# Patient Record
Sex: Female | Born: 1950 | Race: White | Hispanic: No | Smoking: Current every day smoker
Health system: Southern US, Community
[De-identification: ages and names within clinical notes are randomized; demographics above are authoritative.]

## PROBLEM LIST (undated history)

## (undated) DIAGNOSIS — I714 Abdominal aortic aneurysm, without rupture, unspecified: Secondary | ICD-10-CM

## (undated) DIAGNOSIS — IMO0002 Reserved for concepts with insufficient information to code with codable children: Secondary | ICD-10-CM

## (undated) DIAGNOSIS — T7840XA Allergy, unspecified, initial encounter: Secondary | ICD-10-CM

## (undated) DIAGNOSIS — I10 Essential (primary) hypertension: Secondary | ICD-10-CM

## (undated) DIAGNOSIS — I509 Heart failure, unspecified: Secondary | ICD-10-CM

## (undated) DIAGNOSIS — E785 Hyperlipidemia, unspecified: Secondary | ICD-10-CM

## (undated) DIAGNOSIS — T82898A Other specified complication of vascular prosthetic devices, implants and grafts, initial encounter: Secondary | ICD-10-CM

## (undated) DIAGNOSIS — Z923 Personal history of irradiation: Secondary | ICD-10-CM

## (undated) DIAGNOSIS — C719 Malignant neoplasm of brain, unspecified: Secondary | ICD-10-CM

## (undated) DIAGNOSIS — J449 Chronic obstructive pulmonary disease, unspecified: Secondary | ICD-10-CM

## (undated) DIAGNOSIS — C3490 Malignant neoplasm of unspecified part of unspecified bronchus or lung: Secondary | ICD-10-CM

## (undated) DIAGNOSIS — I639 Cerebral infarction, unspecified: Secondary | ICD-10-CM

## (undated) HISTORY — DX: Malignant neoplasm of unspecified part of unspecified bronchus or lung: C34.90

## (undated) HISTORY — DX: Abdominal aortic aneurysm, without rupture, unspecified: I71.40

## (undated) HISTORY — DX: Abdominal aortic aneurysm, without rupture: I71.4

## (undated) HISTORY — DX: Other specified complication of vascular prosthetic devices, implants and grafts, initial encounter: T82.898A

## (undated) HISTORY — DX: Reserved for concepts with insufficient information to code with codable children: IMO0002

## (undated) HISTORY — PX: OTHER SURGICAL HISTORY: SHX169

## (undated) HISTORY — DX: Cerebral infarction, unspecified: I63.9

## (undated) HISTORY — PX: CHOLECYSTECTOMY: SHX55

## (undated) HISTORY — DX: Allergy, unspecified, initial encounter: T78.40XA

## (undated) HISTORY — DX: Personal history of irradiation: Z92.3

## (undated) HISTORY — DX: Malignant neoplasm of brain, unspecified: C71.9

## (undated) HISTORY — DX: Chronic obstructive pulmonary disease, unspecified: J44.9

## (undated) HISTORY — PX: PARTIAL HYSTERECTOMY: SHX80

## (undated) HISTORY — DX: Hyperlipidemia, unspecified: E78.5

## (undated) HISTORY — PX: CARPAL TUNNEL RELEASE: SHX101

---

## 1997-09-21 ENCOUNTER — Encounter: Admission: RE | Admit: 1997-09-21 | Discharge: 1997-12-20 | Payer: Self-pay | Admitting: Orthopaedic Surgery

## 1999-04-02 ENCOUNTER — Emergency Department (HOSPITAL_COMMUNITY): Admission: EM | Admit: 1999-04-02 | Discharge: 1999-04-02 | Payer: Self-pay | Admitting: Emergency Medicine

## 1999-09-30 ENCOUNTER — Emergency Department (HOSPITAL_COMMUNITY): Admission: EM | Admit: 1999-09-30 | Discharge: 1999-09-30 | Payer: Self-pay | Admitting: Emergency Medicine

## 1999-09-30 ENCOUNTER — Encounter: Payer: Self-pay | Admitting: Emergency Medicine

## 2000-10-05 ENCOUNTER — Encounter: Payer: Self-pay | Admitting: Emergency Medicine

## 2000-10-05 ENCOUNTER — Emergency Department (HOSPITAL_COMMUNITY): Admission: EM | Admit: 2000-10-05 | Discharge: 2000-10-05 | Payer: Self-pay | Admitting: Emergency Medicine

## 2001-08-11 ENCOUNTER — Encounter: Payer: Self-pay | Admitting: Emergency Medicine

## 2001-08-11 ENCOUNTER — Emergency Department (HOSPITAL_COMMUNITY): Admission: EM | Admit: 2001-08-11 | Discharge: 2001-08-12 | Payer: Self-pay | Admitting: Emergency Medicine

## 2002-04-14 ENCOUNTER — Emergency Department (HOSPITAL_COMMUNITY): Admission: EM | Admit: 2002-04-14 | Discharge: 2002-04-14 | Payer: Self-pay | Admitting: Emergency Medicine

## 2002-04-14 ENCOUNTER — Encounter: Payer: Self-pay | Admitting: Emergency Medicine

## 2004-04-25 ENCOUNTER — Ambulatory Visit: Admission: RE | Admit: 2004-04-25 | Discharge: 2004-04-25 | Payer: Self-pay | Admitting: Family Medicine

## 2004-09-12 ENCOUNTER — Emergency Department (HOSPITAL_COMMUNITY): Admission: EM | Admit: 2004-09-12 | Discharge: 2004-09-12 | Payer: Self-pay | Admitting: Emergency Medicine

## 2004-12-04 ENCOUNTER — Emergency Department (HOSPITAL_COMMUNITY): Admission: EM | Admit: 2004-12-04 | Discharge: 2004-12-04 | Payer: Self-pay | Admitting: Emergency Medicine

## 2005-01-07 ENCOUNTER — Emergency Department (HOSPITAL_COMMUNITY): Admission: EM | Admit: 2005-01-07 | Discharge: 2005-01-08 | Payer: Self-pay | Admitting: Emergency Medicine

## 2005-06-03 ENCOUNTER — Ambulatory Visit (HOSPITAL_COMMUNITY): Admission: RE | Admit: 2005-06-03 | Discharge: 2005-06-03 | Payer: Self-pay | Admitting: Orthopaedic Surgery

## 2005-07-21 ENCOUNTER — Encounter (INDEPENDENT_AMBULATORY_CARE_PROVIDER_SITE_OTHER): Payer: Self-pay | Admitting: Orthopaedic Surgery

## 2005-07-21 ENCOUNTER — Ambulatory Visit (HOSPITAL_COMMUNITY): Admission: RE | Admit: 2005-07-21 | Discharge: 2005-07-21 | Payer: Self-pay | Admitting: Orthopaedic Surgery

## 2005-08-05 ENCOUNTER — Ambulatory Visit (HOSPITAL_COMMUNITY): Admission: RE | Admit: 2005-08-05 | Discharge: 2005-08-05 | Payer: Self-pay | Admitting: Orthopaedic Surgery

## 2005-10-02 ENCOUNTER — Ambulatory Visit (HOSPITAL_COMMUNITY): Admission: RE | Admit: 2005-10-02 | Discharge: 2005-10-02 | Payer: Self-pay | Admitting: Family Medicine

## 2005-10-09 ENCOUNTER — Ambulatory Visit (HOSPITAL_COMMUNITY): Admission: RE | Admit: 2005-10-09 | Discharge: 2005-10-09 | Payer: Self-pay | Admitting: Gastroenterology

## 2005-10-09 ENCOUNTER — Encounter (INDEPENDENT_AMBULATORY_CARE_PROVIDER_SITE_OTHER): Payer: Self-pay | Admitting: Specialist

## 2006-01-02 ENCOUNTER — Emergency Department (HOSPITAL_COMMUNITY): Admission: EM | Admit: 2006-01-02 | Discharge: 2006-01-03 | Payer: Self-pay | Admitting: Emergency Medicine

## 2006-03-02 ENCOUNTER — Emergency Department (HOSPITAL_COMMUNITY): Admission: EM | Admit: 2006-03-02 | Discharge: 2006-03-03 | Payer: Self-pay | Admitting: Emergency Medicine

## 2006-05-28 ENCOUNTER — Ambulatory Visit (HOSPITAL_COMMUNITY): Admission: RE | Admit: 2006-05-28 | Discharge: 2006-05-28 | Payer: Self-pay | Admitting: Cardiovascular Disease

## 2006-06-01 ENCOUNTER — Ambulatory Visit (HOSPITAL_COMMUNITY): Admission: RE | Admit: 2006-06-01 | Discharge: 2006-06-01 | Payer: Self-pay | Admitting: Cardiovascular Disease

## 2006-08-13 ENCOUNTER — Emergency Department (HOSPITAL_COMMUNITY): Admission: EM | Admit: 2006-08-13 | Discharge: 2006-08-13 | Payer: Self-pay | Admitting: Emergency Medicine

## 2006-09-08 ENCOUNTER — Ambulatory Visit (HOSPITAL_COMMUNITY): Admission: RE | Admit: 2006-09-08 | Discharge: 2006-09-08 | Payer: Self-pay | Admitting: Cardiovascular Disease

## 2006-12-08 ENCOUNTER — Ambulatory Visit (HOSPITAL_COMMUNITY): Admission: RE | Admit: 2006-12-08 | Discharge: 2006-12-08 | Payer: Self-pay | Admitting: Otolaryngology

## 2007-01-01 ENCOUNTER — Emergency Department (HOSPITAL_COMMUNITY): Admission: EM | Admit: 2007-01-01 | Discharge: 2007-01-01 | Payer: Self-pay | Admitting: Emergency Medicine

## 2007-07-01 ENCOUNTER — Ambulatory Visit (HOSPITAL_COMMUNITY): Admission: RE | Admit: 2007-07-01 | Discharge: 2007-07-01 | Payer: Self-pay | Admitting: Orthopaedic Surgery

## 2007-07-30 ENCOUNTER — Emergency Department (HOSPITAL_COMMUNITY): Admission: EM | Admit: 2007-07-30 | Discharge: 2007-07-30 | Payer: Self-pay | Admitting: Emergency Medicine

## 2007-11-02 ENCOUNTER — Ambulatory Visit (HOSPITAL_COMMUNITY): Admission: RE | Admit: 2007-11-02 | Discharge: 2007-11-02 | Payer: Self-pay | Admitting: Orthopaedic Surgery

## 2008-05-11 ENCOUNTER — Emergency Department (HOSPITAL_COMMUNITY): Admission: EM | Admit: 2008-05-11 | Discharge: 2008-05-11 | Payer: Self-pay | Admitting: Emergency Medicine

## 2009-02-08 ENCOUNTER — Emergency Department (HOSPITAL_COMMUNITY): Admission: EM | Admit: 2009-02-08 | Discharge: 2009-02-08 | Payer: Self-pay | Admitting: Emergency Medicine

## 2009-03-12 ENCOUNTER — Emergency Department (HOSPITAL_COMMUNITY): Admission: EM | Admit: 2009-03-12 | Discharge: 2009-03-13 | Payer: Self-pay | Admitting: Emergency Medicine

## 2010-01-28 ENCOUNTER — Emergency Department (HOSPITAL_COMMUNITY): Admission: EM | Admit: 2010-01-28 | Discharge: 2010-01-29 | Payer: Self-pay | Admitting: Emergency Medicine

## 2010-05-01 ENCOUNTER — Emergency Department (HOSPITAL_COMMUNITY): Admission: EM | Admit: 2010-05-01 | Discharge: 2010-05-01 | Payer: Self-pay | Admitting: Emergency Medicine

## 2010-05-03 ENCOUNTER — Encounter (HOSPITAL_COMMUNITY)
Admission: RE | Admit: 2010-05-03 | Discharge: 2010-06-02 | Payer: Self-pay | Source: Home / Self Care | Attending: Emergency Medicine | Admitting: Emergency Medicine

## 2010-05-03 ENCOUNTER — Ambulatory Visit (HOSPITAL_COMMUNITY): Payer: Self-pay | Admitting: Emergency Medicine

## 2010-05-15 ENCOUNTER — Ambulatory Visit (HOSPITAL_COMMUNITY): Payer: Self-pay | Admitting: Emergency Medicine

## 2010-07-25 ENCOUNTER — Emergency Department (HOSPITAL_COMMUNITY)
Admission: EM | Admit: 2010-07-25 | Discharge: 2010-07-25 | Disposition: A | Discharge status: Home / Self Care | Payer: Medicare Other | Attending: Emergency Medicine | Admitting: Emergency Medicine

## 2010-07-25 DIAGNOSIS — J449 Chronic obstructive pulmonary disease, unspecified: Secondary | ICD-10-CM | POA: Insufficient documentation

## 2010-07-25 DIAGNOSIS — F17200 Nicotine dependence, unspecified, uncomplicated: Secondary | ICD-10-CM | POA: Insufficient documentation

## 2010-07-25 DIAGNOSIS — J4489 Other specified chronic obstructive pulmonary disease: Secondary | ICD-10-CM | POA: Insufficient documentation

## 2010-07-25 DIAGNOSIS — Z8739 Personal history of other diseases of the musculoskeletal system and connective tissue: Secondary | ICD-10-CM | POA: Insufficient documentation

## 2010-07-25 DIAGNOSIS — M25519 Pain in unspecified shoulder: Secondary | ICD-10-CM | POA: Insufficient documentation

## 2010-07-25 DIAGNOSIS — I10 Essential (primary) hypertension: Secondary | ICD-10-CM | POA: Insufficient documentation

## 2010-07-25 DIAGNOSIS — E7800 Pure hypercholesterolemia, unspecified: Secondary | ICD-10-CM | POA: Insufficient documentation

## 2010-09-06 LAB — URINE CULTURE

## 2010-09-06 LAB — URINALYSIS, ROUTINE W REFLEX MICROSCOPIC
Hgb urine dipstick: NEGATIVE
Ketones, ur: NEGATIVE mg/dL
Nitrite: NEGATIVE
Protein, ur: NEGATIVE mg/dL
pH: 6 (ref 5.0–8.0)

## 2010-09-06 LAB — GLUCOSE, CAPILLARY: Glucose-Capillary: 111 mg/dL — ABNORMAL HIGH (ref 70–99)

## 2010-09-06 LAB — URINE MICROSCOPIC-ADD ON

## 2010-09-27 LAB — POCT I-STAT, CHEM 8
BUN: 20 mg/dL (ref 6–23)
Calcium, Ion: 1.14 mmol/L (ref 1.12–1.32)
Creatinine, Ser: 0.8 mg/dL (ref 0.4–1.2)
Hemoglobin: 16.3 g/dL — ABNORMAL HIGH (ref 12.0–15.0)
Sodium: 140 mEq/L (ref 135–145)
TCO2: 26 mmol/L (ref 0–100)

## 2010-11-05 NOTE — H&P (Signed)
Courtney West, Courtney West                  ACCOUNT NO.:  [id number]   MEDICAL RECORD NO.:  [id number]          PATIENT TYPE:  AMB   LOCATION:  DAY                           FACILITY:  [facility]   PHYSICIAN:  J. Darreld Mclean, M.D. DATE OF BIRTH:  1951/01/29   DATE OF ADMISSION:  DATE OF DISCHARGE:  LH                              HISTORY & PHYSICAL   CHIEF COMPLAINT:  Pain and tenderness of my left knee.   The patient has had increased pain and tenderness of her left knee.  I  did an arthroscopy on the night in 2007 and she has done well until  recently, when she started having more pain and more tenderness.  She  had a new injury, a twisting injury.  MRI was done on her knee on  July 01, 2007, showing recurrent tear of the medial meniscus which  extends to the inferior surface of the remaining part of the meniscus.  I informed her of the findings.  She wanted to hold off on surgery until  the spring of the year.  It is now the spring of the year.  She has had  more pain and tenderness to her knee.  She wants to go to the [place] on  a cruise the end of May.  I have talked to her about the possibility to  plan surgery when she gets back or do it before.  She would like to have  it done before she goes.  She has giving way of the knee and recurring  effusions.   CURRENT MEDICATIONS:  Ultram 50 mg every 6 hours p.r.n. pain.   PAST HISTORY:  1. Lung disease.  2. Kidney disease.  3. Stroke in 1965 with no residual.  4. Ulcer disease.   The patient is allergic to PENICILLIN, CODEINE and SULFA.   The patient smokes.  The patient use alcoholic beverages.  Dr. Holley Bouche is her family doctor.   She status post T&A, 1961, hysterectomy 1975, hemorrhoid surgery 1969,  colonoscopy 2003, left arm surgery 1999, and the arthroscopy by me 2007.   The patient is divorced and she lives in [place].   BP is 120/86, pulse 80, respirations 16, afebrile, 5 feet 4 inches, 192  pounds.  She is  alert, cooperative, oriented.  HEENT:  Negative.  NECK:  Supple.  LUNGS:  Clear to P&A.  HEART:  Regular without murmur heard.  ABDOMEN:  Benign, soft, nontender, without masses.  EXTREMITIES:  She has pain and tenderness of the left knee more medially  with a positive McMurray.  She has effusion and crepitus.  Distally  there is no edema.  Neurovascular is intact.  SKIN:  Intact.   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   PLAN:  Arthroscopy.  She is familiar with the procedure, having had it  done several years ago.  Her labs are pending.  ______________________________  [name]. Darreld Mclean, M.D.     JWK/MEDQ  D:  11/01/2007  T:  11/01/2007  Job:  [id number]

## 2010-11-05 NOTE — Op Note (Signed)
Courtney West, Courtney West                  ACCOUNT NO.:  [id number]   MEDICAL RECORD NO.:  [id number]          PATIENT TYPE:  AMB   LOCATION:  DAY                           FACILITY:  [facility]   PHYSICIAN:  J. Darreld Mclean, M.D. DATE OF BIRTH:  Apr 16, 1951   DATE OF PROCEDURE:  DATE OF DISCHARGE:                               OPERATIVE REPORT   PREOP DIAGNOSIS:  Tear, left knee medial meniscus.   POSTOP DIAGNOSES:  1. Tear, left knee medial meniscus.  2. Tear laterally of the meniscus.   PROCEDURE:  Operative arthroscopy of the knee on the left with partial  medial and lateral meniscectomy.   ANESTHESIA:  Spinal.   SURGEON:  J. Darreld Mclean, MD   TOURNIQUET TIME:  Please refer anesthesia record.   INDICATIONS:  The patient is a 60 year old female with pain and  tenderness in her knee on the left.  MRI shows a recurrent tear of the  left knee medial meniscus.  She had an arthroscopy of the left knee  approximately 2 years ago and has done well until recently.  She has not  improved with conservative treatment.  Surgery is recommended.  She  understands the risks and imponderables of the procedure.   The patient in the holding area, the left knee was identified as correct  surgical site.  A mark was placed on the left knee.  A mark signed  mattress, one placed on the right knee.  The patient was brought to the  operating room, placed supine on the operating room table.  She was  given general anesthetic.  Tourniquet and leg holder was placed,  deflated.  Left upper thigh was prepped and draped in the usual manner.  A time-out identified, [name] as the patient and left knee as the  correct surgical site.  The leg was then wrapped circumferentially with  an Esmarch bandage.  Tourniquet was inflated 300 mmHg.  Esmarch bandage  was removed.  Inflow cannula inserted medially.  Lactated Ringer's was  instilled into the knee by an infusion pump.  Arthroscope was inserted  laterally and the  knee was systematically examined.   Suprapatellar pouch had mild synovitis and suprapatellar grade 2 changes  medially.  There was tear in the posterior horn of the medial meniscus,  grade 3 changes to the articular surface.  No loose bodies.  Anterior  cruciate was intact.  Laterally, there was a small rim tear and grade 2-  3 changes.   Attention directed to the medial side of the knee using a punch and  meniscal shaver.  A good smooth contour was obtained.  Prone pictures  were taken throughout the procedure.  Attention was directed to the  lateral side and used a meniscal shaver and the remnant tear was  removed.  Prone pictures were taken.  Knee was systematically reexamined  and no pathology found.  The knee was irrigated with remainder part of  lactated Ringer's.  Once we reapproximated using 3-0 nylon interrupted  vertical mattress manner.  Marcaine 0.25% instilled in each portal.  Tourniquet was deflated.  Sterile dressing was applied.  Bulky dressing  was applied.  The patient tolerated the procedure well,  and will go to  recovery in good condition.  She has been on Ultram for pain.  She is  allergic to codeine.  I will see her in the office approximately in 10  days to 2 weeks.  Physical therapy has been arranged.  If any  difficulties, she is to contact me through the office or hospital beeper  system.           ______________________________  [name]. Darreld Mclean, M.D.     JWK/MEDQ  D:  11/02/2007  T:  11/03/2007  Job:  [id number]

## 2010-11-08 NOTE — Op Note (Signed)
NAMEREVECCA, NACHTIGAL                  ACCOUNT NO.:  [id number]   MEDICAL RECORD NO.:  [id number]          PATIENT TYPE:  AMB   LOCATION:  DAY                           FACILITY:  [facility]   PHYSICIAN:  J. Darreld Mclean, M.D. DATE OF BIRTH:  Sep 09, 1950   DATE OF PROCEDURE:  07/21/2005  DATE OF DISCHARGE:                                 OPERATIVE REPORT   PREOPERATIVE DIAGNOSIS:  Tear medial meniscus right knee.   POSTOPERATIVE DIAGNOSIS:  Tear medial meniscus left knee.   PROCEDURE:  1.  Arthroscopy of left knee.  2.  Partial medial meniscectomy using a laser.   ANESTHESIA:  General   SURGEON:  J. Darreld Mclean, M.D.   TOURNIQUET TIME:  20 minutes.   INDICATIONS:  The patient is a 60 year old female complaining of tenderness  in her knee on the left.  MRI showed a tear of the medial meniscus.  The  patient wanted to have surgery done at this time, delayed until after the  new year.  The risks and imponderables were discussed preoperatively, the  patient appeared to understand and agreed to the procedure as outlined.   OPERATIVE FINDINGS:  The suprapatellar pouch was negative.  There were some  grade two changes of the patella femoral joint, particularly on the lateral  aspect.  Medially there was a tear of the medial meniscus of the posterior  horn and on the articular surfaces there was a grade 2-3 changes.  The  anterior cruciate was intact.  Laterally the meniscus was intact with grade  2 changes.  No loose bodies were present.   DESCRIPTION OF PROCEDURE:  The patient was seen in the holding area.  The  left knee was identified as the correct surgical site. She placed, a mark, I  placed a mark over the left knee.  She reported to the operating room and  given general anesthesia and placed supine on the operating room table. Leg  holder and tourniquet placed, deflated left upper thigh.  The patient  prepped in the usual manner.  We had a time out identifying the left knee as  the correct surgical site.  The knee was elevated, wrapped circumferentially  with an Esmarch bandage, tourniquet inflated to 300 mmHg, Esmarch bandage  removed.  A medial incision was made and an inflow cannula was placed.  Lactated ringers was instilled into the left knee by an infusion pump.  The  knee was then systematically examined from the lateral portal through the  arthroscope.  Please see findings above.   Attention directed to the medial side, using a laser the tear was removed.  A good smooth contour was obtained.  Pertinent pictures were taken.  The  knee was systematically re-examined and no new pathology was found in the  left knee.  The arthroscope was removed.  The wound was reapproximated using  3-0 Nylon in an interrupted vertical mattress manner.  Marcaine 0.25% was  instilled into each portal in the left knee.  Tourniquet was deflated for 20  minutes.  Sterile dressing applied.  Bulky dressing applied.  The patient  tolerated the procedure well.  Prescription for Darvocet-N 100 given for  pain.  She is allergic to CODEINE.  I will see her in the office in  approximately 10-days to 2 weeks. Physical therapy has been arranged.  If  any difficulty she is to contact me through the office or hospital beeper  system.           ______________________________  [name]. Darreld Mclean, M.D.     JWK/MEDQ  D:  07/21/2005  T:  07/21/2005  Job:  [id number]

## 2010-11-08 NOTE — H&P (Signed)
Courtney West, SMOLINSKI                  ACCOUNT NO.:  [id number]   MEDICAL RECORD NO.:  [id number]          PATIENT TYPE:  AMB   LOCATION:  DAY                           FACILITY:  [facility]   PHYSICIAN:  J. Darreld Mclean, M.D. DATE OF BIRTH:  1951/06/18   DATE OF ADMISSION:  DATE OF DISCHARGE:  LH                                HISTORY & PHYSICAL   CHIEF COMPLAINT:  The patient is a 60 year old female with pain and  tenderness in her left knee.   HISTORY OF PRESENT ILLNESS:  I first saw her on December 8 complaining of  pain and tenderness for several weeks.  I thought she had a meniscal injury.  I recommended an MRI of the knee.  MRI was done on December 12.  It showed  an oblique tear of the medial meniscus with a second tearing of the  posterior horn.  She had three-compartmental arthritis.  I recommended  surgery.  The patient wanted to wait until this time to have her procedure  done.  I saw her back in the office and the procedure was arranged at that  time.   PAST MEDICAL HISTORY:  1.  History of lung disease.  2.  Kidney disease.  3.  Stroke.  4.  Paralysis.  5.  Ulcer disease.   ALLERGIES:  PENICILLIN, CODEINE, SULFA.   CURRENT MEDICATIONS:  1.  Amitriptyline 100 mg at bedtime.  2.  Naprosyn 500 mg one twice a day after eating.  3.  Hydrocodone as needed for pain.   SOCIAL HISTORY:  She smokes.  She uses alcoholic beverages.  She finished  the 10th grade and got a GED.  Dr. Holley Bouche is her family doctor.  The  patient is divorced.  Lives in [place].   PAST SURGICAL HISTORY:  1.  Status post T&A in 1961.  2.  Hysterectomy in 1975.  3.  Hemorrhoid surgery in 1969.  4.  Colonoscopy in 2003.   FAMILY HISTORY:  Kidney disease runs in her mother's side.  Her mother is  dead and father died of cancer and leukemia.   PHYSICAL EXAMINATION:  VITAL SIGNS:  BP 128/86, pulse 80, respirations 16,  afebrile.  Height 5 feet 4 inches.  Weight 192.  GENERAL:  The patient is  alert, cooperative.  HEENT: Negative.  NECK:  Supple.  LUNGS:  Clear to P&A.  HEART:  Regular rhythm without murmur heard.  ABDOMEN:  Soft, without masses.  EXTREMITIES:  She has pain and tenderness in the left knee.  Slight  effusion.  Slight crepitus.  Other extremities within normal limits.  CNS:  Intact.  SKIN:  Intact.   IMPRESSION:  Tear medial meniscus left knee.   PLAN:  Arthroscopy of the left knee.  I have discussed with the patient the  planned procedure, risks and imponderables.  She appears to understand and  agrees to the procedure as outlined.  Labs are pending.  ______________________________  [name]. Darreld Mclean, M.D.     JWK/MEDQ  D:  07/18/2005  T:  07/18/2005  Job:  [id number]

## 2010-11-08 NOTE — Op Note (Signed)
[name], [name]                  ACCOUNT NO.:  [id number]   MEDICAL RECORD NO.:  [id number]          PATIENT TYPE:  AMB   LOCATION:  ENDO                         FACILITY:  [facility]   PHYSICIAN:  [name], M.D.DATE OF BIRTH:  09-23-1950   DATE OF PROCEDURE:  10/09/2005  DATE OF DISCHARGE:                                 OPERATIVE REPORT   PROCEDURE:  Colonoscopy and polypectomy.   MEDICATIONS:  Fentanyl 50 mcg, Versed 5 mg IV.   INDICATIONS:  History of multiple previous colon polyps.  This is done as a  follow-up procedure.   DESCRIPTION OF PROCEDURE:  The procedure explained to the patient and  consent obtained.  Left lateral decubitus position, pediatric adjustable  colonoscope inserted and advanced.  Prep excellent.  The cecum reached.  The  ileocecal valve and appendiceal orifice identified.  The scope withdrawn.  Mucosa carefully examined on withdrawal.  No polyps are seen in the  ascending, transverse, or descending colon until the mid to distal  descending colon was reached.  In this area, within a few centimeters, there  were four polyps, ranging from 3-6 mm in diameter.  They were removed with a  snare and sucked through the scope.  They were all placed in a single jar.  One of the polyps may have been lost, but it may have been sucked through, I  am not sure, but the others clearly were.  The scope was withdrawn.  There  was no diverticular disease.  In the sigmoid colon, a 3 mm sessile polyp was  removed with a snare and sucked through the scope.  The scope was withdrawn.  The patient tolerated the procedure well.   ASSESSMENT:  Multiple colon polyps removed.   PLAN:  Routine post polypectomy instructions.  Will recommend repeating in  three years.   ASSESSMENT:  Normal screening colonoscopy.  V76.51.   PLAN:  Will recommend yearly Hemoccults and possibly a repeat procedure in  10 years.           ______________________________  Courtney West Malon Kindle., M.D.     Waldron Session  D:  10/09/2005  T:  10/09/2005  Job:  [id number]   cc:   Holley Bouche, M.D.  Fax: [phone]

## 2010-12-06 ENCOUNTER — Emergency Department (HOSPITAL_COMMUNITY): Payer: Medicare Other

## 2010-12-06 ENCOUNTER — Encounter (HOSPITAL_COMMUNITY): Payer: Self-pay | Admitting: Radiology

## 2010-12-06 ENCOUNTER — Emergency Department (HOSPITAL_COMMUNITY)
Admission: EM | Admit: 2010-12-06 | Discharge: 2010-12-06 | Disposition: A | Discharge status: Home / Self Care | Payer: Medicare Other | Attending: Emergency Medicine | Admitting: Emergency Medicine

## 2010-12-06 DIAGNOSIS — F3289 Other specified depressive episodes: Secondary | ICD-10-CM | POA: Insufficient documentation

## 2010-12-06 DIAGNOSIS — Z79899 Other long term (current) drug therapy: Secondary | ICD-10-CM | POA: Insufficient documentation

## 2010-12-06 DIAGNOSIS — J4489 Other specified chronic obstructive pulmonary disease: Secondary | ICD-10-CM | POA: Insufficient documentation

## 2010-12-06 DIAGNOSIS — J449 Chronic obstructive pulmonary disease, unspecified: Secondary | ICD-10-CM | POA: Insufficient documentation

## 2010-12-06 DIAGNOSIS — I714 Abdominal aortic aneurysm, without rupture, unspecified: Secondary | ICD-10-CM | POA: Insufficient documentation

## 2010-12-06 DIAGNOSIS — F329 Major depressive disorder, single episode, unspecified: Secondary | ICD-10-CM | POA: Insufficient documentation

## 2010-12-06 DIAGNOSIS — I10 Essential (primary) hypertension: Secondary | ICD-10-CM | POA: Insufficient documentation

## 2010-12-06 DIAGNOSIS — R0602 Shortness of breath: Secondary | ICD-10-CM | POA: Insufficient documentation

## 2010-12-06 DIAGNOSIS — R059 Cough, unspecified: Secondary | ICD-10-CM | POA: Insufficient documentation

## 2010-12-06 DIAGNOSIS — M129 Arthropathy, unspecified: Secondary | ICD-10-CM | POA: Insufficient documentation

## 2010-12-06 DIAGNOSIS — E7800 Pure hypercholesterolemia, unspecified: Secondary | ICD-10-CM | POA: Insufficient documentation

## 2010-12-06 DIAGNOSIS — C3490 Malignant neoplasm of unspecified part of unspecified bronchus or lung: Secondary | ICD-10-CM | POA: Insufficient documentation

## 2010-12-06 DIAGNOSIS — R05 Cough: Secondary | ICD-10-CM | POA: Insufficient documentation

## 2010-12-06 HISTORY — DX: Essential (primary) hypertension: I10

## 2010-12-06 LAB — DIFFERENTIAL
Basophils Absolute: 0.1 10*3/uL (ref 0.0–0.1)
Basophils Relative: 1 % (ref 0–1)
Eosinophils Absolute: 0.3 10*3/uL (ref 0.0–0.7)
Monocytes Absolute: 0.7 10*3/uL (ref 0.1–1.0)
Monocytes Relative: 5 % (ref 3–12)
Neutro Abs: 7.3 10*3/uL (ref 1.7–7.7)

## 2010-12-06 LAB — CBC
MCH: 31.1 pg (ref 26.0–34.0)
MCHC: 33.9 g/dL (ref 30.0–36.0)
Platelets: 221 10*3/uL (ref 150–400)
RDW: 13 % (ref 11.5–15.5)

## 2010-12-06 LAB — COMPREHENSIVE METABOLIC PANEL
ALT: 23 U/L (ref 0–35)
AST: 19 U/L (ref 0–37)
Albumin: 3.8 g/dL (ref 3.5–5.2)
Alkaline Phosphatase: 135 U/L — ABNORMAL HIGH (ref 39–117)
Calcium: 9.9 mg/dL (ref 8.4–10.5)
GFR calc Af Amer: >60 mL/min (ref >60)
Potassium: 3.8 mEq/L (ref 3.5–5.1)
Sodium: 135 mEq/L (ref 135–145)
Total Protein: 8.1 g/dL (ref 6.0–8.3)

## 2010-12-06 MED ORDER — IOHEXOL 300 MG/ML  SOLN
80.0000 mL | Freq: Once | INTRAMUSCULAR | Status: AC | PRN
  Administered 2010-12-06: 80 mL via INTRAVENOUS

## 2010-12-09 ENCOUNTER — Other Ambulatory Visit: Payer: Self-pay | Admitting: Cardiothoracic Surgery

## 2010-12-09 DIAGNOSIS — R918 Other nonspecific abnormal finding of lung field: Secondary | ICD-10-CM

## 2010-12-09 DIAGNOSIS — R911 Solitary pulmonary nodule: Secondary | ICD-10-CM

## 2010-12-12 ENCOUNTER — Other Ambulatory Visit: Payer: Self-pay | Admitting: Thoracic Surgery

## 2010-12-12 ENCOUNTER — Encounter (INDEPENDENT_AMBULATORY_CARE_PROVIDER_SITE_OTHER): Payer: Medicare Other

## 2010-12-12 ENCOUNTER — Encounter: Payer: Medicare Other | Admitting: Internal Medicine

## 2010-12-12 DIAGNOSIS — D381 Neoplasm of uncertain behavior of trachea, bronchus and lung: Secondary | ICD-10-CM

## 2010-12-12 DIAGNOSIS — R911 Solitary pulmonary nodule: Secondary | ICD-10-CM

## 2010-12-12 DIAGNOSIS — D496 Neoplasm of unspecified behavior of brain: Secondary | ICD-10-CM

## 2010-12-12 DIAGNOSIS — R918 Other nonspecific abnormal finding of lung field: Secondary | ICD-10-CM

## 2010-12-13 NOTE — Assessment & Plan Note (Signed)
OFFICE VISIT  Courtney West, Courtney West DOB:  1950/11/04                                        December 12, 2010 CHART #:  [id number]  This 60 year old patient is a long-time smoker developed shortness of breath and cough, went to the emergency room where chest x-ray was taken which showed a left hilar mass.  A CT scan showed a left hilar mass that was 3.6 x 4.2 with necrosis within the mass.  There also was a questionable right upper hilar node and had emphysema.  She gets shortness of breath with exertion.  She has had no hemoptysis, fever, or chills but does have some excessive sputum.  PAST MEDICAL HISTORY:  She has hypercholesterolemia.  She has been seen by Dr. Durwin Nora.  FAMILY HISTORY:  Noncontributory.  SOCIAL HISTORY:  She is not retired.  She works part-time.  Smokes 1 pack of cigarettes and does not drink alcohol on a regular basis.  REVIEW OF SYSTEMS:  She is 196.  She is 5 feet 3 inches. GENERAL:  Her weight is stable. CARDIAC:  She has shortness of breath with exertion.  No angina or atrial fibrillation. PULMONARY:  She has had bronchitis and wheezing, no asthma. GI:  No nausea, vomiting, constipation, or diarrhea. GU:  No kidney disease, dysuria, or frequent urination. VASCULAR:  She has had some pain in her legs with walking.  She has had a TIA in the past.  No DVT. NEUROLOGIC:  No headaches, dizziness, headaches, or blackouts. MUSCULOSKELETAL:  Arthritis and joint pains. PSYCHIATRIC:  Depression. EYES AND ENT:  No change in her eyesight or hearing. HEMATOLOGIC:  No problems with bleeding, clotting disorders, or anemia.  PHYSICAL EXAMINATION:  GENERAL:  He is a well-developed Caucasian female in no acute distress.  HEAD, EYES, EARS, NOSE, AND THROAT: Unremarkable.  SKIN:  There is some multiple ecchymosis.  CHEST: Increased AP diameter.  Bilateral wheezes.  HEART:  Regular sinus rhythm.  No murmur.  ABDOMEN:  Soft.  EXTREMITIES:  Her pulses  are 2+. There is no clubbing or edema.  NEUROLOGIC:  He is oriented x3.  Sensory and motor intact.  Cranial nerves intact.  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  She has a PET scan scheduled for June 27.  We will get a brain scan as well as a pulmonary function tests with DLCO.  I plan to see her back again and I will go ahead and schedule for bronchoscopy with endobronchial ultrasound and electromagnetic navigation on July 3 at [facility].  Ines Bloomer, M.D. Electronically Signed  DPB/MEDQ  D:  12/12/2010  T:  12/13/2010  Job:  [id number]

## 2010-12-18 ENCOUNTER — Ambulatory Visit (HOSPITAL_COMMUNITY)
Admission: RE | Admit: 2010-12-18 | Discharge: 2010-12-18 | Disposition: A | Discharge status: Home / Self Care | Payer: Medicare Other | Source: Ambulatory Visit | Attending: Thoracic Surgery | Admitting: Thoracic Surgery

## 2010-12-18 ENCOUNTER — Encounter (HOSPITAL_COMMUNITY)
Admission: RE | Admit: 2010-12-18 | Discharge: 2010-12-18 | Disposition: A | Discharge status: Home / Self Care | Payer: Medicare Other | Source: Ambulatory Visit | Attending: Cardiothoracic Surgery | Admitting: Cardiothoracic Surgery

## 2010-12-18 ENCOUNTER — Inpatient Hospital Stay (HOSPITAL_COMMUNITY)
Admission: RE | Admit: 2010-12-18 | Discharge: 2010-12-18 | Disposition: A | Discharge status: Home / Self Care | Payer: Medicare Other | Source: Ambulatory Visit | Attending: Thoracic Surgery | Admitting: Thoracic Surgery

## 2010-12-18 ENCOUNTER — Encounter (HOSPITAL_COMMUNITY): Payer: Self-pay

## 2010-12-18 ENCOUNTER — Other Ambulatory Visit (HOSPITAL_COMMUNITY): Payer: Medicare Other

## 2010-12-18 ENCOUNTER — Encounter (HOSPITAL_COMMUNITY)
Admission: RE | Admit: 2010-12-18 | Discharge: 2010-12-18 | Disposition: A | Discharge status: Home / Self Care | Payer: Medicare Other | Source: Ambulatory Visit | Attending: Thoracic Surgery | Admitting: Thoracic Surgery

## 2010-12-18 DIAGNOSIS — G936 Cerebral edema: Secondary | ICD-10-CM | POA: Insufficient documentation

## 2010-12-18 DIAGNOSIS — M439 Deforming dorsopathy, unspecified: Secondary | ICD-10-CM | POA: Insufficient documentation

## 2010-12-18 DIAGNOSIS — R599 Enlarged lymph nodes, unspecified: Secondary | ICD-10-CM | POA: Insufficient documentation

## 2010-12-18 DIAGNOSIS — C7931 Secondary malignant neoplasm of brain: Secondary | ICD-10-CM | POA: Insufficient documentation

## 2010-12-18 DIAGNOSIS — R911 Solitary pulmonary nodule: Secondary | ICD-10-CM

## 2010-12-18 DIAGNOSIS — Z01812 Encounter for preprocedural laboratory examination: Secondary | ICD-10-CM | POA: Insufficient documentation

## 2010-12-18 DIAGNOSIS — J984 Other disorders of lung: Secondary | ICD-10-CM | POA: Insufficient documentation

## 2010-12-18 DIAGNOSIS — I700 Atherosclerosis of aorta: Secondary | ICD-10-CM | POA: Insufficient documentation

## 2010-12-18 DIAGNOSIS — G9389 Other specified disorders of brain: Secondary | ICD-10-CM | POA: Insufficient documentation

## 2010-12-18 DIAGNOSIS — Z8673 Personal history of transient ischemic attack (TIA), and cerebral infarction without residual deficits: Secondary | ICD-10-CM | POA: Insufficient documentation

## 2010-12-18 DIAGNOSIS — R51 Headache: Secondary | ICD-10-CM | POA: Insufficient documentation

## 2010-12-18 DIAGNOSIS — D496 Neoplasm of unspecified behavior of brain: Secondary | ICD-10-CM

## 2010-12-18 DIAGNOSIS — R918 Other nonspecific abnormal finding of lung field: Secondary | ICD-10-CM

## 2010-12-18 LAB — CBC
HCT: 46.9 % — ABNORMAL HIGH (ref 36.0–46.0)
Hemoglobin: 16.6 g/dL — ABNORMAL HIGH (ref 12.0–15.0)
MCH: 32 pg (ref 26.0–34.0)
RBC: 5.18 MIL/uL — ABNORMAL HIGH (ref 3.87–5.11)

## 2010-12-18 LAB — COMPREHENSIVE METABOLIC PANEL
ALT: 30 U/L (ref 0–35)
AST: 20 U/L (ref 0–37)
Alkaline Phosphatase: 134 U/L — ABNORMAL HIGH (ref 39–117)
CO2: 28 mEq/L (ref 19–32)
Calcium: 9.7 mg/dL (ref 8.4–10.5)
GFR calc non Af Amer: 56 mL/min — ABNORMAL LOW (ref >60)
Potassium: 4.2 mEq/L (ref 3.5–5.1)
Sodium: 140 mEq/L (ref 135–145)
Total Protein: 6.6 g/dL (ref 6.0–8.3)

## 2010-12-18 LAB — PROTIME-INR: Prothrombin Time: 12.7 seconds (ref 11.6–15.2)

## 2010-12-18 LAB — GLUCOSE, CAPILLARY: Glucose-Capillary: 140 mg/dL — ABNORMAL HIGH (ref 70–99)

## 2010-12-18 MED ORDER — IOHEXOL 300 MG/ML  SOLN
100.0000 mL | Freq: Once | INTRAMUSCULAR | Status: AC | PRN
  Administered 2010-12-18: 100 mL via INTRAVENOUS

## 2010-12-18 MED ORDER — FLUDEOXYGLUCOSE F - 18 (FDG) INJECTION
18.1000 | Freq: Once | INTRAVENOUS | Status: AC | PRN
  Administered 2010-12-18: 18.1 via INTRAVENOUS

## 2010-12-22 DIAGNOSIS — C3490 Malignant neoplasm of unspecified part of unspecified bronchus or lung: Secondary | ICD-10-CM

## 2010-12-22 HISTORY — DX: Malignant neoplasm of unspecified part of unspecified bronchus or lung: C34.90

## 2010-12-24 ENCOUNTER — Other Ambulatory Visit: Payer: Self-pay | Admitting: Thoracic Surgery

## 2010-12-24 ENCOUNTER — Ambulatory Visit (HOSPITAL_COMMUNITY)
Admission: RE | Admit: 2010-12-24 | Discharge: 2010-12-24 | Disposition: A | Discharge status: Home / Self Care | Payer: Medicare Other | Source: Ambulatory Visit | Attending: Thoracic Surgery | Admitting: Thoracic Surgery

## 2010-12-24 ENCOUNTER — Encounter (HOSPITAL_COMMUNITY)
Admission: RE | Admit: 2010-12-24 | Discharge: 2010-12-24 | Disposition: A | Discharge status: Home / Self Care | Payer: Medicare Other | Source: Ambulatory Visit | Attending: Thoracic Surgery | Admitting: Thoracic Surgery

## 2010-12-24 DIAGNOSIS — F17200 Nicotine dependence, unspecified, uncomplicated: Secondary | ICD-10-CM | POA: Insufficient documentation

## 2010-12-24 DIAGNOSIS — R918 Other nonspecific abnormal finding of lung field: Secondary | ICD-10-CM

## 2010-12-24 DIAGNOSIS — Z79899 Other long term (current) drug therapy: Secondary | ICD-10-CM | POA: Insufficient documentation

## 2010-12-24 DIAGNOSIS — J449 Chronic obstructive pulmonary disease, unspecified: Secondary | ICD-10-CM | POA: Insufficient documentation

## 2010-12-24 DIAGNOSIS — I714 Abdominal aortic aneurysm, without rupture, unspecified: Secondary | ICD-10-CM | POA: Insufficient documentation

## 2010-12-24 DIAGNOSIS — R599 Enlarged lymph nodes, unspecified: Secondary | ICD-10-CM

## 2010-12-24 DIAGNOSIS — Z01818 Encounter for other preprocedural examination: Secondary | ICD-10-CM | POA: Insufficient documentation

## 2010-12-24 DIAGNOSIS — J4489 Other specified chronic obstructive pulmonary disease: Secondary | ICD-10-CM | POA: Insufficient documentation

## 2010-12-24 DIAGNOSIS — Z01812 Encounter for preprocedural laboratory examination: Secondary | ICD-10-CM | POA: Insufficient documentation

## 2010-12-24 DIAGNOSIS — C3410 Malignant neoplasm of upper lobe, unspecified bronchus or lung: Secondary | ICD-10-CM | POA: Insufficient documentation

## 2010-12-26 ENCOUNTER — Encounter (INDEPENDENT_AMBULATORY_CARE_PROVIDER_SITE_OTHER): Payer: Medicare Other

## 2010-12-26 ENCOUNTER — Encounter (INDEPENDENT_AMBULATORY_CARE_PROVIDER_SITE_OTHER): Payer: Medicare Other | Admitting: Internal Medicine

## 2010-12-26 DIAGNOSIS — C3490 Malignant neoplasm of unspecified part of unspecified bronchus or lung: Secondary | ICD-10-CM

## 2010-12-26 NOTE — Letter (Signed)
December 26, 2010    Re:  Courtney West, WHAN                  DOB:  1951-01-21    The patient returned today after her bronchoscopy with endobronchial ultrasound.  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  She also was informed that she had a small brain met which Dr. Mitzi Hansen will see her about and order an MRI.  Dr. Arbutus Ped will see her regarding starting radiation and chemotherapy.  We will see her back again if needed.  I appreciate the opportunity of seeing [name].  Ines Bloomer, M.D. Electronically Signed  DPB/MEDQ  D:  12/26/2010  T:  12/26/2010  Job:  [id number]

## 2010-12-27 ENCOUNTER — Other Ambulatory Visit: Payer: Self-pay | Admitting: Radiation Oncology

## 2010-12-27 DIAGNOSIS — C3490 Malignant neoplasm of unspecified part of unspecified bronchus or lung: Secondary | ICD-10-CM

## 2010-12-27 LAB — CULTURE, RESPIRATORY W GRAM STAIN

## 2010-12-30 NOTE — Op Note (Signed)
  Courtney West, Courtney West                  ACCOUNT NO.:  [id number]  MEDICAL RECORD NO.:  [id number]  LOCATION:  XRAY                         FACILITY:  [facility]  PHYSICIAN:  Ines Bloomer, M.D. DATE OF BIRTH:  31-Dec-1950  DATE OF PROCEDURE: DATE OF DISCHARGE:  12/24/2010                              OPERATIVE REPORT   PREOPERATIVE DIAGNOSIS:  Left upper lobe mass.  POSTOPERATIVE DIAGNOSIS:  Non-small cell lung cancer.  OPERATION PERFORMED:  Fiberoptic bronchoscopy with endobronchial ultrasound.  DESCRIPTION OF PROCEDURE:  After general anesthesia, the video bronchoscope was passed through the endotracheal tube.  The carina was at midline.  Left mainstem, we could see a cancer at the subcarina and almost completely occluded in the left upper lobe as well as going down into the left lower lobe.  The right mainstem, right upper lobe, right middle lobe, and right lower lobe orifices were normal. We did brushings from the left upper lobe.  Then using the large biopsy forceps, we did multiple biopsies of the subcarinal between the left upper lobe and left lower lobe as well as in the orifice of the left upper lobe.  We then passed the endobronchial ultrasound and identified a 7 node and did two passes with that by passing the sheath through the working channel and seen that the sheath was out of the working channel and then under some guidance passing the needle into the nodes.  We then went ahead and do an area where there were 10 L node which was real close to most likely the subcarina and under ultrasound guidance passed into that mass which was more likely primary tumor mass and did two aspirations there.  On rapid on-site cytology revealed non-small cell lung cancer.  We sent washings and called for cytology and culture.  The patient returned to recovery room in stable condition.     Ines Bloomer, M.D.     DPB/MEDQ  D:  12/24/2010  T:  12/25/2010  Job:   [id number]  Electronically Signed by Jovita Gamma M.D. on 12/30/2010 01:55:11 PM

## 2011-01-03 ENCOUNTER — Ambulatory Visit
Admission: RE | Admit: 2011-01-03 | Discharge: 2011-01-03 | Disposition: A | Discharge status: Home / Self Care | Payer: Medicare Other | Source: Ambulatory Visit | Attending: Radiation Oncology | Admitting: Radiation Oncology

## 2011-01-03 DIAGNOSIS — C3490 Malignant neoplasm of unspecified part of unspecified bronchus or lung: Secondary | ICD-10-CM

## 2011-01-03 MED ORDER — GADOBENATE DIMEGLUMINE 529 MG/ML IV SOLN
20.0000 mL | Freq: Once | INTRAVENOUS | Status: AC | PRN
  Administered 2011-01-03: 20 mL via INTRAVENOUS

## 2011-01-09 ENCOUNTER — Ambulatory Visit
Admission: RE | Admit: 2011-01-09 | Discharge: 2011-01-09 | Disposition: A | Discharge status: Home / Self Care | Payer: Medicare Other | Source: Ambulatory Visit | Attending: Radiation Oncology | Admitting: Radiation Oncology

## 2011-01-09 DIAGNOSIS — Z510 Encounter for antineoplastic radiation therapy: Secondary | ICD-10-CM | POA: Insufficient documentation

## 2011-01-09 DIAGNOSIS — C3490 Malignant neoplasm of unspecified part of unspecified bronchus or lung: Secondary | ICD-10-CM | POA: Insufficient documentation

## 2011-01-09 DIAGNOSIS — C7949 Secondary malignant neoplasm of other parts of nervous system: Secondary | ICD-10-CM | POA: Insufficient documentation

## 2011-01-09 DIAGNOSIS — C7931 Secondary malignant neoplasm of brain: Secondary | ICD-10-CM | POA: Insufficient documentation

## 2011-01-16 ENCOUNTER — Other Ambulatory Visit: Payer: Self-pay | Admitting: Internal Medicine

## 2011-01-16 ENCOUNTER — Encounter (HOSPITAL_BASED_OUTPATIENT_CLINIC_OR_DEPARTMENT_OTHER): Payer: Medicare Other | Admitting: Internal Medicine

## 2011-01-16 DIAGNOSIS — C7949 Secondary malignant neoplasm of other parts of nervous system: Secondary | ICD-10-CM

## 2011-01-16 DIAGNOSIS — C3410 Malignant neoplasm of upper lobe, unspecified bronchus or lung: Secondary | ICD-10-CM

## 2011-01-16 DIAGNOSIS — C7931 Secondary malignant neoplasm of brain: Secondary | ICD-10-CM

## 2011-01-16 DIAGNOSIS — C3490 Malignant neoplasm of unspecified part of unspecified bronchus or lung: Secondary | ICD-10-CM

## 2011-01-16 LAB — COMPREHENSIVE METABOLIC PANEL
ALT: 24 U/L (ref 0–35)
Alkaline Phosphatase: 89 U/L (ref 39–117)
CO2: 28 mEq/L (ref 19–32)
Sodium: 139 mEq/L (ref 135–145)
Total Bilirubin: 0.4 mg/dL (ref 0.3–1.2)
Total Protein: 6.9 g/dL (ref 6.0–8.3)

## 2011-01-16 LAB — CBC WITH DIFFERENTIAL/PLATELET
EOS%: 0.5 % (ref 0.0–7.0)
Eosinophils Absolute: 0.1 10*3/uL (ref 0.0–0.5)
MCV: 90.5 fL (ref 79.5–101.0)
MONO%: 3.2 % (ref 0.0–14.0)
NEUT#: 12.5 10*3/uL — ABNORMAL HIGH (ref 1.5–6.5)
RBC: 5.25 10*6/uL (ref 3.70–5.45)
RDW: 13.3 % (ref 11.2–14.5)
nRBC: 0 % (ref 0–0)

## 2011-01-21 ENCOUNTER — Other Ambulatory Visit: Payer: Self-pay | Admitting: Internal Medicine

## 2011-01-21 ENCOUNTER — Encounter (HOSPITAL_BASED_OUTPATIENT_CLINIC_OR_DEPARTMENT_OTHER): Payer: Medicare Other | Admitting: Internal Medicine

## 2011-01-21 DIAGNOSIS — Z5111 Encounter for antineoplastic chemotherapy: Secondary | ICD-10-CM

## 2011-01-21 DIAGNOSIS — C3490 Malignant neoplasm of unspecified part of unspecified bronchus or lung: Secondary | ICD-10-CM

## 2011-01-21 DIAGNOSIS — C3410 Malignant neoplasm of upper lobe, unspecified bronchus or lung: Secondary | ICD-10-CM

## 2011-01-21 DIAGNOSIS — C7931 Secondary malignant neoplasm of brain: Secondary | ICD-10-CM

## 2011-01-21 LAB — COMPREHENSIVE METABOLIC PANEL
ALT: 43 U/L — ABNORMAL HIGH (ref 0–35)
Alkaline Phosphatase: 90 U/L (ref 39–117)
Sodium: 139 mEq/L (ref 135–145)
Total Bilirubin: 0.4 mg/dL (ref 0.3–1.2)
Total Protein: 6.2 g/dL (ref 6.0–8.3)

## 2011-01-21 LAB — CBC WITH DIFFERENTIAL/PLATELET
BASO%: 0.3 % (ref 0.0–2.0)
LYMPH%: 16.2 % (ref 14.0–49.7)
MCH: 31.4 pg (ref 25.1–34.0)
MCHC: 34.3 g/dL (ref 31.5–36.0)
MCV: 91.7 fL (ref 79.5–101.0)
MONO%: 1.5 % (ref 0.0–14.0)
Platelets: 242 10*3/uL (ref 145–400)
RBC: 4.94 10*6/uL (ref 3.70–5.45)

## 2011-01-23 ENCOUNTER — Other Ambulatory Visit: Payer: Self-pay | Admitting: Internal Medicine

## 2011-01-23 DIAGNOSIS — C3490 Malignant neoplasm of unspecified part of unspecified bronchus or lung: Secondary | ICD-10-CM

## 2011-01-27 ENCOUNTER — Other Ambulatory Visit: Payer: Self-pay | Admitting: Internal Medicine

## 2011-01-27 ENCOUNTER — Ambulatory Visit (HOSPITAL_COMMUNITY)
Admission: RE | Admit: 2011-01-27 | Discharge: 2011-01-27 | Disposition: A | Discharge status: Home / Self Care | Payer: Medicare Other | Source: Ambulatory Visit | Attending: Internal Medicine | Admitting: Internal Medicine

## 2011-01-27 DIAGNOSIS — C3490 Malignant neoplasm of unspecified part of unspecified bronchus or lung: Secondary | ICD-10-CM

## 2011-01-27 LAB — CBC
MCH: 30.2 pg (ref 26.0–34.0)
MCV: 92.3 fL (ref 78.0–100.0)
Platelets: 257 10*3/uL (ref 150–400)
RBC: 5.04 MIL/uL (ref 3.87–5.11)

## 2011-01-28 ENCOUNTER — Other Ambulatory Visit: Payer: Self-pay | Admitting: Internal Medicine

## 2011-01-28 ENCOUNTER — Encounter (HOSPITAL_BASED_OUTPATIENT_CLINIC_OR_DEPARTMENT_OTHER): Payer: Medicare Other | Admitting: Internal Medicine

## 2011-01-28 DIAGNOSIS — C7949 Secondary malignant neoplasm of other parts of nervous system: Secondary | ICD-10-CM

## 2011-01-28 DIAGNOSIS — C3490 Malignant neoplasm of unspecified part of unspecified bronchus or lung: Secondary | ICD-10-CM

## 2011-01-28 DIAGNOSIS — Z5111 Encounter for antineoplastic chemotherapy: Secondary | ICD-10-CM

## 2011-01-28 LAB — COMPREHENSIVE METABOLIC PANEL
AST: 15 U/L (ref 0–37)
Alkaline Phosphatase: 96 U/L (ref 39–117)
BUN: 19 mg/dL (ref 6–23)
Creatinine, Ser: 0.66 mg/dL (ref 0.50–1.10)
Potassium: 3.8 mEq/L (ref 3.5–5.3)
Total Bilirubin: 0.4 mg/dL (ref 0.3–1.2)

## 2011-01-28 LAB — CBC WITH DIFFERENTIAL/PLATELET
BASO%: 0.2 % (ref 0.0–2.0)
EOS%: 0.5 % (ref 0.0–7.0)
HCT: 47.4 % — ABNORMAL HIGH (ref 34.8–46.6)
LYMPH%: 30.4 % (ref 14.0–49.7)
MCH: 31.2 pg (ref 25.1–34.0)
MCHC: 33.5 g/dL (ref 31.5–36.0)
MCV: 92.9 fL (ref 79.5–101.0)
MONO%: 6.9 % (ref 0.0–14.0)
NEUT%: 62 % (ref 38.4–76.8)
lymph#: 5.3 10*3/uL — ABNORMAL HIGH (ref 0.9–3.3)

## 2011-02-04 ENCOUNTER — Other Ambulatory Visit: Payer: Self-pay | Admitting: Internal Medicine

## 2011-02-04 ENCOUNTER — Encounter (HOSPITAL_BASED_OUTPATIENT_CLINIC_OR_DEPARTMENT_OTHER): Payer: Medicare Other | Admitting: Internal Medicine

## 2011-02-04 DIAGNOSIS — C7931 Secondary malignant neoplasm of brain: Secondary | ICD-10-CM

## 2011-02-04 DIAGNOSIS — C3490 Malignant neoplasm of unspecified part of unspecified bronchus or lung: Secondary | ICD-10-CM

## 2011-02-04 DIAGNOSIS — Z5111 Encounter for antineoplastic chemotherapy: Secondary | ICD-10-CM

## 2011-02-04 DIAGNOSIS — C3410 Malignant neoplasm of upper lobe, unspecified bronchus or lung: Secondary | ICD-10-CM

## 2011-02-04 LAB — CBC WITH DIFFERENTIAL/PLATELET
Basophils Absolute: 0 10*3/uL (ref 0.0–0.1)
EOS%: 0.3 % (ref 0.0–7.0)
Eosinophils Absolute: 0 10*3/uL (ref 0.0–0.5)
HCT: 44.4 % (ref 34.8–46.6)
HGB: 14.9 g/dL (ref 11.6–15.9)
MCH: 31 pg (ref 25.1–34.0)
MCV: 92.5 fL (ref 79.5–101.0)
MONO%: 6.1 % (ref 0.0–14.0)
NEUT#: 8.1 10*3/uL — ABNORMAL HIGH (ref 1.5–6.5)
NEUT%: 68 % (ref 38.4–76.8)
Platelets: 191 10*3/uL (ref 145–400)

## 2011-02-04 LAB — COMPREHENSIVE METABOLIC PANEL
AST: 16 U/L (ref 0–37)
Albumin: 3.1 g/dL — ABNORMAL LOW (ref 3.5–5.2)
Alkaline Phosphatase: 86 U/L (ref 39–117)
BUN: 22 mg/dL (ref 6–23)
Calcium: 9.3 mg/dL (ref 8.4–10.5)
Creatinine, Ser: 0.68 mg/dL (ref 0.50–1.10)
Glucose, Bld: 81 mg/dL (ref 70–99)

## 2011-02-11 ENCOUNTER — Other Ambulatory Visit: Payer: Self-pay | Admitting: Internal Medicine

## 2011-02-11 ENCOUNTER — Encounter (HOSPITAL_BASED_OUTPATIENT_CLINIC_OR_DEPARTMENT_OTHER): Payer: Medicare Other | Admitting: Internal Medicine

## 2011-02-11 DIAGNOSIS — C7931 Secondary malignant neoplasm of brain: Secondary | ICD-10-CM

## 2011-02-11 DIAGNOSIS — C3490 Malignant neoplasm of unspecified part of unspecified bronchus or lung: Secondary | ICD-10-CM

## 2011-02-11 DIAGNOSIS — C7949 Secondary malignant neoplasm of other parts of nervous system: Secondary | ICD-10-CM

## 2011-02-11 DIAGNOSIS — Z5111 Encounter for antineoplastic chemotherapy: Secondary | ICD-10-CM

## 2011-02-11 LAB — CBC WITH DIFFERENTIAL/PLATELET
BASO%: 1.4 % (ref 0.0–2.0)
Basophils Absolute: 0.2 10*3/uL — ABNORMAL HIGH (ref 0.0–0.1)
EOS%: 0.2 % (ref 0.0–7.0)
Eosinophils Absolute: 0 10*3/uL (ref 0.0–0.5)
HCT: 41.9 % (ref 34.8–46.6)
HGB: 14.5 g/dL (ref 11.6–15.9)
LYMPH%: 24 % (ref 14.0–49.7)
MCH: 31.8 pg (ref 25.1–34.0)
MCHC: 34.5 g/dL (ref 31.5–36.0)
MCV: 92.2 fL (ref 79.5–101.0)
MONO#: 0.6 10*3/uL (ref 0.1–0.9)
MONO%: 5.1 % (ref 0.0–14.0)
NEUT#: 8.1 10*3/uL — ABNORMAL HIGH (ref 1.5–6.5)
NEUT%: 69.3 % (ref 38.4–76.8)
Platelets: 150 10*3/uL (ref 145–400)
RBC: 4.55 10*6/uL (ref 3.70–5.45)
RDW: 14.1 % (ref 11.2–14.5)
WBC: 11.6 10*3/uL — ABNORMAL HIGH (ref 3.9–10.3)
lymph#: 2.8 10*3/uL (ref 0.9–3.3)

## 2011-02-11 LAB — COMPREHENSIVE METABOLIC PANEL
ALT: 29 U/L (ref 0–35)
AST: 14 U/L (ref 0–37)
Albumin: 3.6 g/dL (ref 3.5–5.2)
Alkaline Phosphatase: 81 U/L (ref 39–117)
BUN: 28 mg/dL — ABNORMAL HIGH (ref 6–23)
CO2: 24 mEq/L (ref 19–32)
Calcium: 8.9 mg/dL (ref 8.4–10.5)
Chloride: 105 mEq/L (ref 96–112)
Creatinine, Ser: 1.12 mg/dL — ABNORMAL HIGH (ref 0.50–1.10)
Glucose, Bld: 85 mg/dL (ref 70–99)
Potassium: 3.9 mEq/L (ref 3.5–5.3)
Sodium: 139 mEq/L (ref 135–145)
Total Bilirubin: 0.3 mg/dL (ref 0.3–1.2)
Total Protein: 6.2 g/dL (ref 6.0–8.3)

## 2011-02-18 ENCOUNTER — Other Ambulatory Visit: Payer: Self-pay | Admitting: Internal Medicine

## 2011-02-18 ENCOUNTER — Encounter (HOSPITAL_BASED_OUTPATIENT_CLINIC_OR_DEPARTMENT_OTHER): Payer: Medicare Other | Admitting: Internal Medicine

## 2011-02-18 DIAGNOSIS — Z5111 Encounter for antineoplastic chemotherapy: Secondary | ICD-10-CM

## 2011-02-18 DIAGNOSIS — C3490 Malignant neoplasm of unspecified part of unspecified bronchus or lung: Secondary | ICD-10-CM

## 2011-02-18 DIAGNOSIS — C7931 Secondary malignant neoplasm of brain: Secondary | ICD-10-CM

## 2011-02-18 LAB — CBC WITH DIFFERENTIAL/PLATELET
BASO%: 0.3 % (ref 0.0–2.0)
LYMPH%: 14.8 % (ref 14.0–49.7)
MCHC: 34.9 g/dL (ref 31.5–36.0)
MCV: 90.8 fL (ref 79.5–101.0)
MONO%: 6.5 % (ref 0.0–14.0)
Platelets: 139 10*3/uL — ABNORMAL LOW (ref 145–400)
RBC: 4.58 10*6/uL (ref 3.70–5.45)
nRBC: 0 % (ref 0–0)

## 2011-02-18 LAB — COMPREHENSIVE METABOLIC PANEL
ALT: 48 U/L — ABNORMAL HIGH (ref 0–35)
AST: 24 U/L (ref 0–37)
BUN: 27 mg/dL — ABNORMAL HIGH (ref 6–23)
CO2: 29 mEq/L (ref 19–32)
Creatinine, Ser: 0.83 mg/dL (ref 0.50–1.10)
Total Bilirubin: 0.2 mg/dL — ABNORMAL LOW (ref 0.3–1.2)

## 2011-02-26 ENCOUNTER — Encounter (HOSPITAL_BASED_OUTPATIENT_CLINIC_OR_DEPARTMENT_OTHER): Payer: Medicare Other | Admitting: Internal Medicine

## 2011-02-26 ENCOUNTER — Other Ambulatory Visit: Payer: Self-pay | Admitting: Internal Medicine

## 2011-02-26 ENCOUNTER — Encounter: Payer: Medicare Other | Admitting: Internal Medicine

## 2011-02-26 DIAGNOSIS — C3490 Malignant neoplasm of unspecified part of unspecified bronchus or lung: Secondary | ICD-10-CM

## 2011-02-26 DIAGNOSIS — C7931 Secondary malignant neoplasm of brain: Secondary | ICD-10-CM

## 2011-02-26 DIAGNOSIS — C3410 Malignant neoplasm of upper lobe, unspecified bronchus or lung: Secondary | ICD-10-CM

## 2011-02-26 LAB — CBC WITH DIFFERENTIAL/PLATELET
BASO%: 0.2 % (ref 0.0–2.0)
Basophils Absolute: 0 10*3/uL (ref 0.0–0.1)
HCT: 41 % (ref 34.8–46.6)
HGB: 14 g/dL (ref 11.6–15.9)
LYMPH%: 17.5 % (ref 14.0–49.7)
MCHC: 34.1 g/dL (ref 31.5–36.0)
MONO#: 0.5 10*3/uL (ref 0.1–0.9)
NEUT%: 70.6 % (ref 38.4–76.8)
Platelets: 113 10*3/uL — ABNORMAL LOW (ref 145–400)
WBC: 4.3 10*3/uL (ref 3.9–10.3)

## 2011-03-05 ENCOUNTER — Encounter (HOSPITAL_BASED_OUTPATIENT_CLINIC_OR_DEPARTMENT_OTHER): Payer: Medicare Other | Admitting: Internal Medicine

## 2011-03-05 ENCOUNTER — Other Ambulatory Visit: Payer: Self-pay | Admitting: Internal Medicine

## 2011-03-05 DIAGNOSIS — C7949 Secondary malignant neoplasm of other parts of nervous system: Secondary | ICD-10-CM

## 2011-03-05 DIAGNOSIS — Z5111 Encounter for antineoplastic chemotherapy: Secondary | ICD-10-CM

## 2011-03-05 DIAGNOSIS — C3490 Malignant neoplasm of unspecified part of unspecified bronchus or lung: Secondary | ICD-10-CM

## 2011-03-05 LAB — CBC WITH DIFFERENTIAL/PLATELET
BASO%: 0.2 % (ref 0.0–2.0)
Basophils Absolute: 0 10*3/uL (ref 0.0–0.1)
EOS%: 1.5 % (ref 0.0–7.0)
HCT: 37.9 % (ref 34.8–46.6)
HGB: 13.3 g/dL (ref 11.6–15.9)
LYMPH%: 21.5 % (ref 14.0–49.7)
MCH: 31.5 pg (ref 25.1–34.0)
MCHC: 35.1 g/dL (ref 31.5–36.0)
MCV: 89.8 fL (ref 79.5–101.0)
MONO%: 11.8 % (ref 0.0–14.0)
NEUT%: 65 % (ref 38.4–76.8)
Platelets: 147 10*3/uL (ref 145–400)

## 2011-03-05 LAB — COMPREHENSIVE METABOLIC PANEL
AST: 27 U/L (ref 0–37)
Alkaline Phosphatase: 151 U/L — ABNORMAL HIGH (ref 39–117)
BUN: 11 mg/dL (ref 6–23)
Creatinine, Ser: 0.62 mg/dL (ref 0.50–1.10)
Glucose, Bld: 81 mg/dL (ref 70–99)

## 2011-03-14 LAB — URINALYSIS, ROUTINE W REFLEX MICROSCOPIC
Bilirubin Urine: NEGATIVE
Hgb urine dipstick: NEGATIVE
Nitrite: NEGATIVE
Specific Gravity, Urine: 1.01
Urobilinogen, UA: 0.2
pH: 5.5

## 2011-03-24 ENCOUNTER — Other Ambulatory Visit: Payer: Self-pay | Admitting: Radiation Oncology

## 2011-03-24 DIAGNOSIS — C7931 Secondary malignant neoplasm of brain: Secondary | ICD-10-CM

## 2011-03-25 LAB — STREP A DNA PROBE

## 2011-03-25 LAB — RAPID STREP SCREEN (MED CTR MEBANE ONLY): Streptococcus, Group A Screen (Direct): NEGATIVE

## 2011-04-08 LAB — URINALYSIS, ROUTINE W REFLEX MICROSCOPIC
Bilirubin Urine: NEGATIVE
Glucose, UA: NEGATIVE
Ketones, ur: NEGATIVE
Protein, ur: NEGATIVE
pH: 5.5

## 2011-04-11 ENCOUNTER — Other Ambulatory Visit: Payer: Self-pay | Admitting: Internal Medicine

## 2011-04-11 ENCOUNTER — Ambulatory Visit
Admission: RE | Admit: 2011-04-11 | Discharge: 2011-04-11 | Disposition: A | Discharge status: Home / Self Care | Payer: Medicare Other | Source: Ambulatory Visit | Attending: Radiation Oncology | Admitting: Radiation Oncology

## 2011-04-11 ENCOUNTER — Ambulatory Visit (HOSPITAL_COMMUNITY)
Admission: RE | Admit: 2011-04-11 | Discharge: 2011-04-11 | Disposition: A | Discharge status: Home / Self Care | Payer: Medicare Other | Source: Ambulatory Visit | Attending: Internal Medicine | Admitting: Internal Medicine

## 2011-04-11 ENCOUNTER — Encounter (HOSPITAL_BASED_OUTPATIENT_CLINIC_OR_DEPARTMENT_OTHER): Payer: Medicare Other | Admitting: Internal Medicine

## 2011-04-11 DIAGNOSIS — C3490 Malignant neoplasm of unspecified part of unspecified bronchus or lung: Secondary | ICD-10-CM

## 2011-04-11 DIAGNOSIS — C7931 Secondary malignant neoplasm of brain: Secondary | ICD-10-CM

## 2011-04-11 DIAGNOSIS — R911 Solitary pulmonary nodule: Secondary | ICD-10-CM | POA: Insufficient documentation

## 2011-04-11 DIAGNOSIS — Z9089 Acquired absence of other organs: Secondary | ICD-10-CM | POA: Insufficient documentation

## 2011-04-11 DIAGNOSIS — I714 Abdominal aortic aneurysm, without rupture, unspecified: Secondary | ICD-10-CM | POA: Insufficient documentation

## 2011-04-11 LAB — CBC WITH DIFFERENTIAL/PLATELET
Basophils Absolute: 0 10*3/uL (ref 0.0–0.1)
EOS%: 2.9 % (ref 0.0–7.0)
Eosinophils Absolute: 0.3 10*3/uL (ref 0.0–0.5)
HCT: 41.4 % (ref 34.8–46.6)
HGB: 14.4 g/dL (ref 11.6–15.9)
MCH: 33.1 pg (ref 25.1–34.0)
MCV: 95.3 fL (ref 79.5–101.0)
NEUT#: 6.7 10*3/uL — ABNORMAL HIGH (ref 1.5–6.5)
NEUT%: 69.1 % (ref 38.4–76.8)
RDW: 17 % — ABNORMAL HIGH (ref 11.2–14.5)
lymph#: 2.1 10*3/uL (ref 0.9–3.3)

## 2011-04-11 LAB — CMP (CANCER CENTER ONLY)
Albumin: 3.3 g/dL (ref 3.3–5.5)
BUN, Bld: 13 mg/dL (ref 7–22)
Calcium: 9.2 mg/dL (ref 8.0–10.3)
Chloride: 98 mEq/L (ref 98–108)
Creat: 1 mg/dl (ref 0.6–1.2)
Glucose, Bld: 115 mg/dL (ref 73–118)
Potassium: 3.8 mEq/L (ref 3.3–4.7)

## 2011-04-11 MED ORDER — GADOBENATE DIMEGLUMINE 529 MG/ML IV SOLN
18.0000 mL | Freq: Once | INTRAVENOUS | Status: AC | PRN
  Administered 2011-04-11: 18 mL via INTRAVENOUS

## 2011-04-11 MED ORDER — IOHEXOL 300 MG/ML  SOLN
100.0000 mL | Freq: Once | INTRAMUSCULAR | Status: AC | PRN
  Administered 2011-04-11: 100 mL via INTRAVENOUS

## 2011-04-14 ENCOUNTER — Ambulatory Visit
Admission: RE | Admit: 2011-04-14 | Discharge: 2011-04-14 | Disposition: A | Discharge status: Home / Self Care | Payer: Medicare Other | Source: Ambulatory Visit | Attending: Radiation Oncology | Admitting: Radiation Oncology

## 2011-04-14 ENCOUNTER — Encounter (HOSPITAL_BASED_OUTPATIENT_CLINIC_OR_DEPARTMENT_OTHER): Payer: Medicare Other | Admitting: Internal Medicine

## 2011-04-14 DIAGNOSIS — C7949 Secondary malignant neoplasm of other parts of nervous system: Secondary | ICD-10-CM

## 2011-04-14 DIAGNOSIS — C3490 Malignant neoplasm of unspecified part of unspecified bronchus or lung: Secondary | ICD-10-CM

## 2011-04-17 ENCOUNTER — Ambulatory Visit
Admission: RE | Admit: 2011-04-17 | Discharge: 2011-04-17 | Disposition: A | Discharge status: Home / Self Care | Payer: Medicare Other | Source: Ambulatory Visit | Attending: Radiation Oncology | Admitting: Radiation Oncology

## 2011-04-17 DIAGNOSIS — C7931 Secondary malignant neoplasm of brain: Secondary | ICD-10-CM | POA: Insufficient documentation

## 2011-04-17 DIAGNOSIS — C7949 Secondary malignant neoplasm of other parts of nervous system: Secondary | ICD-10-CM | POA: Insufficient documentation

## 2011-04-17 DIAGNOSIS — C3490 Malignant neoplasm of unspecified part of unspecified bronchus or lung: Secondary | ICD-10-CM | POA: Insufficient documentation

## 2011-04-18 DIAGNOSIS — Z923 Personal history of irradiation: Secondary | ICD-10-CM

## 2011-04-18 HISTORY — DX: Personal history of irradiation: Z92.3

## 2011-04-22 ENCOUNTER — Other Ambulatory Visit: Payer: Self-pay | Admitting: Internal Medicine

## 2011-04-22 ENCOUNTER — Encounter (HOSPITAL_BASED_OUTPATIENT_CLINIC_OR_DEPARTMENT_OTHER): Payer: Medicare Other | Admitting: Internal Medicine

## 2011-04-22 DIAGNOSIS — Z5111 Encounter for antineoplastic chemotherapy: Secondary | ICD-10-CM

## 2011-04-22 DIAGNOSIS — C7931 Secondary malignant neoplasm of brain: Secondary | ICD-10-CM

## 2011-04-22 DIAGNOSIS — C3410 Malignant neoplasm of upper lobe, unspecified bronchus or lung: Secondary | ICD-10-CM

## 2011-04-22 DIAGNOSIS — C3490 Malignant neoplasm of unspecified part of unspecified bronchus or lung: Secondary | ICD-10-CM

## 2011-04-22 LAB — CBC WITH DIFFERENTIAL/PLATELET
Basophils Absolute: 0 10*3/uL (ref 0.0–0.1)
EOS%: 0.1 % (ref 0.0–7.0)
Eosinophils Absolute: 0 10*3/uL (ref 0.0–0.5)
HCT: 41.4 % (ref 34.8–46.6)
HGB: 14.2 g/dL (ref 11.6–15.9)
MONO#: 0.4 10*3/uL (ref 0.1–0.9)
NEUT#: 9.8 10*3/uL — ABNORMAL HIGH (ref 1.5–6.5)
NEUT%: 85.5 % — ABNORMAL HIGH (ref 38.4–76.8)
RDW: 14.4 % (ref 11.2–14.5)
WBC: 11.5 10*3/uL — ABNORMAL HIGH (ref 3.9–10.3)
lymph#: 1.2 10*3/uL (ref 0.9–3.3)

## 2011-04-22 LAB — COMPREHENSIVE METABOLIC PANEL
Albumin: 4.1 g/dL (ref 3.5–5.2)
Alkaline Phosphatase: 98 U/L (ref 39–117)
CO2: 25 mEq/L (ref 19–32)
Calcium: 10.2 mg/dL (ref 8.4–10.5)
Chloride: 104 mEq/L (ref 96–112)
Glucose, Bld: 97 mg/dL (ref 70–99)
Potassium: 4.1 mEq/L (ref 3.5–5.3)
Sodium: 141 mEq/L (ref 135–145)
Total Protein: 7.3 g/dL (ref 6.0–8.3)

## 2011-04-29 ENCOUNTER — Other Ambulatory Visit: Payer: Medicare Other | Admitting: Lab

## 2011-05-06 ENCOUNTER — Other Ambulatory Visit (HOSPITAL_BASED_OUTPATIENT_CLINIC_OR_DEPARTMENT_OTHER): Payer: Medicare Other | Admitting: Lab

## 2011-05-06 ENCOUNTER — Other Ambulatory Visit: Payer: Self-pay | Admitting: Internal Medicine

## 2011-05-06 DIAGNOSIS — C3490 Malignant neoplasm of unspecified part of unspecified bronchus or lung: Secondary | ICD-10-CM

## 2011-05-06 LAB — COMPREHENSIVE METABOLIC PANEL
AST: 23 U/L (ref 0–37)
Albumin: 3.4 g/dL — ABNORMAL LOW (ref 3.5–5.2)
Alkaline Phosphatase: 102 U/L (ref 39–117)
Potassium: 4 mEq/L (ref 3.5–5.3)
Sodium: 138 mEq/L (ref 135–145)
Total Protein: 7.4 g/dL (ref 6.0–8.3)

## 2011-05-06 LAB — CBC WITH DIFFERENTIAL/PLATELET
EOS%: 1.1 % (ref 0.0–7.0)
Eosinophils Absolute: 0.1 10*3/uL (ref 0.0–0.5)
MCH: 33.1 pg (ref 25.1–34.0)
MCV: 98.1 fL (ref 79.5–101.0)
MONO%: 9.1 % (ref 0.0–14.0)
NEUT#: 6.9 10*3/uL — ABNORMAL HIGH (ref 1.5–6.5)
RBC: 4.15 10*6/uL (ref 3.70–5.45)
RDW: 14.4 % (ref 11.2–14.5)
lymph#: 1.9 10*3/uL (ref 0.9–3.3)

## 2011-05-10 ENCOUNTER — Encounter: Payer: Self-pay | Admitting: *Deleted

## 2011-05-10 ENCOUNTER — Other Ambulatory Visit: Payer: Self-pay | Admitting: Internal Medicine

## 2011-05-10 DIAGNOSIS — C3490 Malignant neoplasm of unspecified part of unspecified bronchus or lung: Secondary | ICD-10-CM

## 2011-05-10 DIAGNOSIS — C3491 Malignant neoplasm of unspecified part of right bronchus or lung: Secondary | ICD-10-CM | POA: Insufficient documentation

## 2011-05-13 ENCOUNTER — Ambulatory Visit (HOSPITAL_BASED_OUTPATIENT_CLINIC_OR_DEPARTMENT_OTHER): Payer: Medicare Other | Admitting: Internal Medicine

## 2011-05-13 ENCOUNTER — Other Ambulatory Visit (HOSPITAL_BASED_OUTPATIENT_CLINIC_OR_DEPARTMENT_OTHER): Payer: Medicare Other | Admitting: Lab

## 2011-05-13 ENCOUNTER — Other Ambulatory Visit: Payer: Self-pay | Admitting: Internal Medicine

## 2011-05-13 ENCOUNTER — Ambulatory Visit (HOSPITAL_BASED_OUTPATIENT_CLINIC_OR_DEPARTMENT_OTHER): Payer: Medicare Other

## 2011-05-13 VITALS — BP 120/73 | HR 96 | Temp 99.0°F | Ht 63.0 in | Wt 182.2 lb

## 2011-05-13 DIAGNOSIS — Z5111 Encounter for antineoplastic chemotherapy: Secondary | ICD-10-CM

## 2011-05-13 DIAGNOSIS — C3490 Malignant neoplasm of unspecified part of unspecified bronchus or lung: Secondary | ICD-10-CM

## 2011-05-13 DIAGNOSIS — C7949 Secondary malignant neoplasm of other parts of nervous system: Secondary | ICD-10-CM

## 2011-05-13 DIAGNOSIS — C7931 Secondary malignant neoplasm of brain: Secondary | ICD-10-CM

## 2011-05-13 LAB — CBC WITH DIFFERENTIAL/PLATELET
EOS%: 0 % (ref 0.0–7.0)
MCH: 32.6 pg (ref 25.1–34.0)
MCHC: 33.8 g/dL (ref 31.5–36.0)
MCV: 96.6 fL (ref 79.5–101.0)
MONO%: 4 % (ref 0.0–14.0)
RBC: 4.11 10*6/uL (ref 3.70–5.45)
RDW: 13.1 % (ref 11.2–14.5)
nRBC: 0 % (ref 0–0)

## 2011-05-13 LAB — COMPREHENSIVE METABOLIC PANEL
ALT: 10 U/L (ref 0–35)
AST: 10 U/L (ref 0–37)
Chloride: 105 mEq/L (ref 96–112)
Creatinine, Ser: 0.71 mg/dL (ref 0.50–1.10)
Sodium: 143 mEq/L (ref 135–145)
Total Bilirubin: 0.2 mg/dL — ABNORMAL LOW (ref 0.3–1.2)

## 2011-05-13 MED ORDER — SODIUM CHLORIDE 0.9 % IV SOLN
634.5000 mg | Freq: Once | INTRAVENOUS | Status: AC
  Administered 2011-05-13: 630 mg via INTRAVENOUS
  Filled 2011-05-13: qty 63

## 2011-05-13 MED ORDER — SODIUM CHLORIDE 0.9 % IJ SOLN
100.0000 ug | Freq: Once | INTRAVENOUS | Status: AC
  Administered 2011-05-13: 0.1 mg via INTRADERMAL
  Filled 2011-05-13: qty 0.01

## 2011-05-13 MED ORDER — HEPARIN SOD (PORK) LOCK FLUSH 100 UNIT/ML IV SOLN
500.0000 [IU] | Freq: Once | INTRAVENOUS | Status: AC | PRN
  Administered 2011-05-13: 500 [IU]
  Filled 2011-05-13: qty 5

## 2011-05-13 MED ORDER — DEXAMETHASONE SODIUM PHOSPHATE 4 MG/ML IJ SOLN: 20.0000 mg | Freq: Once | INTRAMUSCULAR | Status: DC

## 2011-05-13 MED ORDER — SODIUM CHLORIDE 0.9 % IV SOLN
Freq: Once | INTRAVENOUS | Status: DC
Start: 2011-05-13 — End: 2011-05-13

## 2011-05-13 MED ORDER — SODIUM CHLORIDE 0.9 % IJ SOLN: 100.0000 ug | Freq: Once | INTRAVENOUS | Status: DC

## 2011-05-13 MED ORDER — SODIUM CHLORIDE 0.9 % IJ SOLN
10.0000 mL | INTRAMUSCULAR | Status: DC | PRN
  Administered 2011-05-13: 10 mL
  Filled 2011-05-13: qty 10

## 2011-05-13 MED ORDER — SODIUM CHLORIDE 0.9 % IV SOLN
500.0000 mg/m2 | Freq: Once | INTRAVENOUS | Status: AC
  Administered 2011-05-13: 950 mg via INTRAVENOUS
  Filled 2011-05-13: qty 38

## 2011-05-13 MED ORDER — ONDANSETRON 16 MG/50ML IVPB (CHCC): 16.0000 mg | Freq: Once | INTRAVENOUS | Status: DC

## 2011-05-13 NOTE — Progress Notes (Signed)
[facility] OFFICE PROGRESS NOTE  DIAGNOSIS: Metastatic non-small cell lung cancer, favoring adenocarcinoma diagnosed in July of 2012, presented with locally advanced disease in the chest as well as brain metastasis.  PRIOR THERAPY:  1. Status post stereotactic radiotherapy to 2 brain lesions under the care of Dr. Mitzi Hansen. 2. Status post a course of concurrent chemoradiation with weekly carboplatin and paclitaxel, last dose of chemotherapy was given 03/05/2011.  CURRENT THERAPY:  Systemic chemotherapy with carboplatin for AUC of 5 and Alimta 500 mg/M2. The patient is status post 1 cycle.  INTERVAL HISTORY: Courtney West 60 y.o. female returns to the clinic today for followup visit. She was accompanied her daughter. The patient is feeling fine today she denied having any specific complaints. He tolerated the first cycle of her systemic chemotherapy fairly well. She had some itching after the carboplatin infusion. She has been taken benadryl some improvement. She denied having any significant nausea or vomiting, no alopecia, no chest pain or shortness of breath. She is here today to start cycle #2 of her chemotherapy.  MEDICAL HISTORY: Past Medical History  Diagnosis Date  . Hypertension   . COPD (chronic obstructive pulmonary disease)   . Stroke   . Abdominal aortic aneurysm   . Dyslipidemia     ALLERGIES:  is allergic to carboplatin; codeine; penicillins; and sulfa antibiotics.  MEDICATIONS:  Current Outpatient Prescriptions  Medication Sig Dispense Refill  . acetaminophen (TYLENOL) 325 MG tablet Take 650 mg by mouth every 6 (six) hours as needed.        Marland Kitchen amitriptyline (ELAVIL) 100 MG tablet Take 200 mg by mouth at bedtime.        Marland Kitchen dexamethasone (DECADRON) 4 MG tablet Take 4 mg by mouth as directed. 1 tab BID day before of and after chemo       . diltiazem (DILACOR XR) 120 MG 24 hr capsule Take 120 mg by mouth daily.        . folic acid (FOLVITE) 1 MG tablet Take 1 mg by  mouth daily.        . prochlorperazine (COMPAZINE) 10 MG tablet Take 10 mg by mouth every 6 (six) hours as needed.        . Varenicline Tartrate (CHANTIX PO) Take by mouth daily. Pt unsure of dose        No current facility-administered medications for this visit.   Facility-Administered Medications Ordered in Other Visits  Medication Dose Route Frequency Provider Last Rate Last Dose  . CARBOplatin chemo intradermal Test Dose 100 mcg  100 mcg Intradermal Once Tarhonda Hollenberg K. Arbutus Ped, MD        SURGICAL HISTORY:  Past Surgical History  Procedure Date  . Carpal tunnel release   . Left knee surgery   . Cholecystectomy     REVIEW OF SYSTEMS:  A comprehensive review of systems was negative.   PHYSICAL EXAMINATION: General appearance: alert, cooperative and no distress Head: Normocephalic, without obvious abnormality, atraumatic Neck: no adenopathy Lymph nodes: Cervical, supraclavicular, and axillary nodes normal. Resp: clear to auscultation bilaterally Cardio: regular rate and rhythm, S1, S2 normal, no murmur, click, rub or gallop GI: soft, non-tender; bowel sounds normal; no masses,  no organomegaly Extremities: extremities normal, atraumatic, no cyanosis or edema Neurologic: Alert and oriented X 3, normal strength and tone. Normal symmetric reflexes. Normal coordination and gait  ECOG PERFORMANCE STATUS: 1 - Symptomatic but completely ambulatory  Blood pressure 120/73, pulse 96, temperature 99 F (37.2 C), height 5\' 3"  (  1.6 m), weight 182 lb 3.2 oz (82.645 kg).  LABORATORY DATA: Lab Results  Component Value Date   WBC 10.6* 05/13/2011   HGB 13.4 05/13/2011   HCT 39.7 05/13/2011   MCV 96.6 05/13/2011   PLT 306 05/13/2011      Chemistry      Component Value Date/Time   NA 138 05/06/2011 1616   NA 144 04/11/2011 1200   K 4.0 05/06/2011 1616   K 3.8 04/11/2011 1200   CL 100 05/06/2011 1616   CL 98 04/11/2011 1200   CO2 30 05/06/2011 1616   CO2 29 04/11/2011 1200   BUN 12  05/06/2011 1616   BUN 13 04/11/2011 1200   CREATININE 0.75 05/06/2011 1616   CREATININE 1.0 04/11/2011 1200      Component Value Date/Time   CALCIUM 9.9 05/06/2011 1616   CALCIUM 9.2 04/11/2011 1200   ALKPHOS 102 05/06/2011 1616   ALKPHOS 90* 04/11/2011 1200   AST 23 05/06/2011 1616   AST 21 04/11/2011 1200   ALT 28 05/06/2011 1616   BILITOT 0.2* 05/06/2011 1616   BILITOT 0.50 04/11/2011 1200       ASSESSMENT: This is a very pleasant 60 years old white female with metastatic non-small cell lung cancer, adenocarcinoma, currently on treatment with carboplatin and Alimta, status post 1 cycle. Patient is doing fine and tolerated the first cycle fairly well except for the itching after carboplatin.   PLAN: We'll proceed with a second cycle today as planned. She would have a carboplatin test dose before proceeding with the full dose. If the patient continues to have significant itching with carboplatin would discontinue this treatment and she would continue on Alimta as a single agent. She would come back for followup visit in 3 weeks with the next cycle of her chemotherapy.   All questions were answered. The patient knows to call the clinic with any problems, questions or concerns. We can certainly see the patient much sooner if necessary.

## 2011-05-13 NOTE — Patient Instructions (Signed)
Pt instructed AVS.

## 2011-05-16 ENCOUNTER — Telehealth: Payer: Self-pay | Admitting: Internal Medicine

## 2011-05-16 NOTE — Telephone Encounter (Signed)
Called pt and left message regarding appt for 12/11 , time has been changed

## 2011-05-20 ENCOUNTER — Encounter: Payer: Self-pay | Admitting: *Deleted

## 2011-05-20 ENCOUNTER — Other Ambulatory Visit (HOSPITAL_BASED_OUTPATIENT_CLINIC_OR_DEPARTMENT_OTHER): Payer: Medicare Other | Admitting: Lab

## 2011-05-20 DIAGNOSIS — C3490 Malignant neoplasm of unspecified part of unspecified bronchus or lung: Secondary | ICD-10-CM

## 2011-05-20 DIAGNOSIS — C7949 Secondary malignant neoplasm of other parts of nervous system: Secondary | ICD-10-CM | POA: Insufficient documentation

## 2011-05-20 LAB — COMPREHENSIVE METABOLIC PANEL
ALT: 33 U/L (ref 0–35)
AST: 18 U/L (ref 0–37)
CO2: 28 mEq/L (ref 19–32)
Creatinine, Ser: 0.81 mg/dL (ref 0.50–1.10)
Total Bilirubin: 0.1 mg/dL — ABNORMAL LOW (ref 0.3–1.2)

## 2011-05-20 LAB — CBC WITH DIFFERENTIAL/PLATELET
BASO%: 0.3 % (ref 0.0–2.0)
EOS%: 0.8 % (ref 0.0–7.0)
HCT: 40.8 % (ref 34.8–46.6)
LYMPH%: 41.3 % (ref 14.0–49.7)
MCH: 33 pg (ref 25.1–34.0)
MCHC: 33.1 g/dL (ref 31.5–36.0)
MCV: 99.6 fL (ref 79.5–101.0)
MONO%: 9.7 % (ref 0.0–14.0)
NEUT%: 47.9 % (ref 38.4–76.8)
Platelets: 138 10*3/uL — ABNORMAL LOW (ref 145–400)

## 2011-05-26 ENCOUNTER — Encounter: Payer: Self-pay | Admitting: Radiation Oncology

## 2011-05-26 ENCOUNTER — Ambulatory Visit
Admission: RE | Admit: 2011-05-26 | Discharge: 2011-05-26 | Payer: Medicare Other | Source: Ambulatory Visit | Attending: Radiation Oncology | Admitting: Radiation Oncology

## 2011-05-26 DIAGNOSIS — C719 Malignant neoplasm of brain, unspecified: Secondary | ICD-10-CM

## 2011-05-26 NOTE — Progress Notes (Signed)
  Radiation Oncology         [phone] ________________________________  Name: Courtney West MRN: [id number]  Date: 05/26/2011  DOB: 08/28/1950  CC: Lajuana Matte, M.D.  Coletta Memos, M.D.   DIAGNOSIS:  This is a 60 year old woman s/p salvage SRS for metastatic non-small cell lung cancer with 6 new brain metastases.  Interval since TREATMENT:  One month.  NARRATIVE:  The patient was scheduled for a followup visit in our office today. However, she did not arrive for a visit. We contacted her by telephone. We will attempt to reschedule her appointment. If she has no complaints whatsoever come we may move ahead with her next followup visit in 2 months which would be accompanied by brain imaging.   PLAN:  The patient will return to radiation oncology clinic following MRI of the brain in 2 months for ongoing surveillance purposes.   ________________________________  Artist Pais Kathrynn Running, M.D.

## 2011-05-27 ENCOUNTER — Other Ambulatory Visit: Payer: Medicare Other | Admitting: Lab

## 2011-05-27 ENCOUNTER — Telehealth: Payer: Self-pay | Admitting: *Deleted

## 2011-05-28 ENCOUNTER — Encounter: Payer: Self-pay | Admitting: Radiation Oncology

## 2011-05-28 ENCOUNTER — Other Ambulatory Visit (HOSPITAL_BASED_OUTPATIENT_CLINIC_OR_DEPARTMENT_OTHER): Payer: Medicare Other | Admitting: Lab

## 2011-05-28 ENCOUNTER — Ambulatory Visit
Admission: RE | Admit: 2011-05-28 | Discharge: 2011-05-28 | Disposition: A | Discharge status: Home / Self Care | Payer: Medicare Other | Source: Ambulatory Visit | Attending: Radiation Oncology | Admitting: Radiation Oncology

## 2011-05-28 VITALS — BP 120/78 | HR 85 | Temp 97.9°F | Resp 18 | Wt 177.9 lb

## 2011-05-28 DIAGNOSIS — C3490 Malignant neoplasm of unspecified part of unspecified bronchus or lung: Secondary | ICD-10-CM

## 2011-05-28 DIAGNOSIS — C719 Malignant neoplasm of brain, unspecified: Secondary | ICD-10-CM

## 2011-05-28 LAB — CBC WITH DIFFERENTIAL/PLATELET
BASO%: 0.4 % (ref 0.0–2.0)
HCT: 42.2 % (ref 34.8–46.6)
LYMPH%: 21.9 % (ref 14.0–49.7)
MCHC: 34 g/dL (ref 31.5–36.0)
MONO#: 0.8 10*3/uL (ref 0.1–0.9)
NEUT%: 66.6 % (ref 38.4–76.8)
Platelets: 147 10*3/uL (ref 145–400)
WBC: 7.9 10*3/uL (ref 3.9–10.3)

## 2011-05-28 LAB — COMPREHENSIVE METABOLIC PANEL
ALT: 38 U/L — ABNORMAL HIGH (ref 0–35)
BUN: 11 mg/dL (ref 6–23)
CO2: 30 mEq/L (ref 19–32)
Creatinine, Ser: 0.8 mg/dL (ref 0.50–1.10)
Glucose, Bld: 89 mg/dL (ref 70–99)
Total Bilirubin: 0.1 mg/dL — ABNORMAL LOW (ref 0.3–1.2)

## 2011-05-28 NOTE — Progress Notes (Signed)
EYES SWOLLEN AND BURNING SINCE YESTERDAY.   HAS H/A "ALL THE TIME"

## 2011-05-29 NOTE — Progress Notes (Signed)
Today, Courtney West presented to followup for one month checkup.  She has some eye irritation. She has occasional nausea but no headaches.  The patient will return for brain MRI in 2 months and followup with Dr. Franky Macho few days later to review the results.

## 2011-05-30 NOTE — Op Note (Signed)
NAME:  Courtney West, Courtney West                       ACCOUNT NO.:  MEDICAL RECORD NO.:  [id number]  LOCATION:                                 FACILITY:  PHYSICIAN:  Coletta Memos, M.D.     DATE OF BIRTH:  Nov 07, 1950  DATE OF PROCEDURE: DATE OF DISCHARGE:                              OPERATIVE REPORT   PREOPERATIVE DIAGNOSIS:  Metastatic lung cancer to the brain.  POSTOPERATIVE DIAGNOSIS:  Metastatic lung cancer to the brain.  PROCEDURE:  Treatment of left temporal metastatic tumor with radiosurgery.  FINDINGS:  Left temporal vermilion lesion, right frontal lesion, left parietal region and left cerebellar region.  __________doses were for 20 Gy.  She received these with dynamic arcs, 3 arcs for each lesion.  She had a flattening sulcus.  [name]  had her preoperative planning done on a workstation and this was confirmed by both myself and Dr. Kathrynn Running, the radiation oncologist. We then proceeded to place her in a radiologic suite where she was placed on a couch.  A __________face mask was used to secure to the couch.  Localization was performed with the exact track system.  She then proceeded to have treatment to all of the lesions via the true beam system.  Exact track was used to localize prior to each treatment session for each lesion.  She tolerated the procedure well without difficulty.  She was sent to the Radiation Oncology Clinic postoperatively.  She received no anesthetics.  She received no blood. She tolerated her procedure well.  POSTOPERATIVE STATUS:  Metastatic brain tumor.  PROCEDURE:  Radiosurgery to 5 cerebral targets.  COMPLICATIONS:  None.          ______________________________ Coletta Memos, M.D.     KC/MEDQ  D:  05/29/2011  T:  05/30/2011  Job:  [id number]

## 2011-06-03 ENCOUNTER — Ambulatory Visit (HOSPITAL_BASED_OUTPATIENT_CLINIC_OR_DEPARTMENT_OTHER): Payer: Medicare Other | Admitting: Physician Assistant

## 2011-06-03 ENCOUNTER — Other Ambulatory Visit: Payer: Self-pay | Admitting: Internal Medicine

## 2011-06-03 ENCOUNTER — Other Ambulatory Visit (HOSPITAL_BASED_OUTPATIENT_CLINIC_OR_DEPARTMENT_OTHER): Payer: Medicare Other | Admitting: Lab

## 2011-06-03 ENCOUNTER — Ambulatory Visit (HOSPITAL_BASED_OUTPATIENT_CLINIC_OR_DEPARTMENT_OTHER): Payer: Medicare Other

## 2011-06-03 ENCOUNTER — Encounter: Payer: Self-pay | Admitting: Physician Assistant

## 2011-06-03 VITALS — BP 143/84 | HR 90 | Temp 97.3°F | Ht 63.0 in | Wt 179.0 lb

## 2011-06-03 DIAGNOSIS — C7931 Secondary malignant neoplasm of brain: Secondary | ICD-10-CM

## 2011-06-03 DIAGNOSIS — F17200 Nicotine dependence, unspecified, uncomplicated: Secondary | ICD-10-CM

## 2011-06-03 DIAGNOSIS — Z5111 Encounter for antineoplastic chemotherapy: Secondary | ICD-10-CM

## 2011-06-03 DIAGNOSIS — C3490 Malignant neoplasm of unspecified part of unspecified bronchus or lung: Secondary | ICD-10-CM

## 2011-06-03 DIAGNOSIS — C7949 Secondary malignant neoplasm of other parts of nervous system: Secondary | ICD-10-CM

## 2011-06-03 DIAGNOSIS — C719 Malignant neoplasm of brain, unspecified: Secondary | ICD-10-CM

## 2011-06-03 LAB — COMPREHENSIVE METABOLIC PANEL
ALT: 28 U/L (ref 0–35)
Albumin: 3.9 g/dL (ref 3.5–5.2)
BUN: 20 mg/dL (ref 6–23)
CO2: 24 mEq/L (ref 19–32)
Calcium: 9.6 mg/dL (ref 8.4–10.5)
Chloride: 103 mEq/L (ref 96–112)
Creatinine, Ser: 0.74 mg/dL (ref 0.50–1.10)

## 2011-06-03 LAB — CBC WITH DIFFERENTIAL/PLATELET
Eosinophils Absolute: 0 10*3/uL (ref 0.0–0.5)
HCT: 40.1 % (ref 34.8–46.6)
HGB: 14 g/dL (ref 11.6–15.9)
LYMPH%: 10.8 % — ABNORMAL LOW (ref 14.0–49.7)
MONO#: 0.7 10*3/uL (ref 0.1–0.9)
NEUT#: 8.2 10*3/uL — ABNORMAL HIGH (ref 1.5–6.5)
NEUT%: 82.1 % — ABNORMAL HIGH (ref 38.4–76.8)
Platelets: 222 10*3/uL (ref 145–400)
WBC: 9.9 10*3/uL (ref 3.9–10.3)

## 2011-06-03 MED ORDER — ONDANSETRON 16 MG/50ML IVPB (CHCC)
16.0000 mg | Freq: Once | INTRAVENOUS | Status: AC
  Administered 2011-06-03: 16 mg via INTRAVENOUS

## 2011-06-03 MED ORDER — SODIUM CHLORIDE 0.9 % IJ SOLN
100.0000 ug | Freq: Once | INTRAVENOUS | Status: AC
  Administered 2011-06-03: 0.1 mg via INTRADERMAL
  Filled 2011-06-03: qty 0.01

## 2011-06-03 MED ORDER — CYANOCOBALAMIN 1000 MCG/ML IJ SOLN: 1000.0000 ug | Freq: Once | INTRAMUSCULAR | Status: DC

## 2011-06-03 MED ORDER — DEXAMETHASONE SODIUM PHOSPHATE 4 MG/ML IJ SOLN
20.0000 mg | Freq: Once | INTRAMUSCULAR | Status: AC
  Administered 2011-06-03: 20 mg via INTRAVENOUS

## 2011-06-03 MED ORDER — SODIUM CHLORIDE 0.9 % IV SOLN
Freq: Once | INTRAVENOUS | Status: AC
  Administered 2011-06-03: 15:00:00 via INTRAVENOUS

## 2011-06-03 MED ORDER — HEPARIN SOD (PORK) LOCK FLUSH 100 UNIT/ML IV SOLN
500.0000 [IU] | Freq: Once | INTRAVENOUS | Status: AC | PRN
  Administered 2011-06-03: 500 [IU]
  Filled 2011-06-03: qty 5

## 2011-06-03 MED ORDER — SODIUM CHLORIDE 0.9 % IJ SOLN
10.0000 mL | INTRAMUSCULAR | Status: DC | PRN
  Administered 2011-06-03: 10 mL
  Filled 2011-06-03: qty 10

## 2011-06-03 MED ORDER — SODIUM CHLORIDE 0.9 % IV SOLN
500.0000 mg/m2 | Freq: Once | INTRAVENOUS | Status: AC
  Administered 2011-06-03: 950 mg via INTRAVENOUS
  Filled 2011-06-03: qty 38

## 2011-06-03 NOTE — Patient Instructions (Signed)
Call MD for problems 

## 2011-06-04 NOTE — Progress Notes (Signed)
[facility] OFFICE PROGRESS NOTE  DIAGNOSIS: Metastatic non-small cell lung cancer, favoring adenocarcinoma diagnosed in July of 2012, presented with locally advanced disease in the chest as well as brain metastasis.  PRIOR THERAPY:  1. Status post stereotactic radiotherapy to 2 brain lesions under the care of Dr. Mitzi Hansen. 2. Status post a course of concurrent chemoradiation with weekly carboplatin and paclitaxel, last dose of chemotherapy was given 03/05/2011.  CURRENT THERAPY:  Systemic chemotherapy with carboplatin for AUC of 5 and Alimta 500 mg/M2. The patient is status post 2 cycles.  INTERVAL HISTORY: Courtney West 60 y.o. female returns to the clinic today for followup visit. She complains of cough that leads to vomiting. She's had rare episodes where she has coughed up blood mixed in with the mucus. Unfortunately she continues to smoke. She did try Chantix however she had "daymares" and bizarre dreams and therefore discontinued the Chantix. MEDICAL HISTORY: Past Medical History  Diagnosis Date  . Hypertension   . COPD (chronic obstructive pulmonary disease)   . Stroke   . Abdominal aortic aneurysm   . Dyslipidemia   . S/P radiation therapy 7/12 thru 9/12, 04/18/11    xrt to brain mets  . Lung cancer 12/2010  . Brain cancer     mets from lung primary    ALLERGIES:  is allergic to carboplatin; codeine; penicillins; and sulfa antibiotics.  MEDICATIONS:  Current Outpatient Prescriptions  Medication Sig Dispense Refill  . acetaminophen (TYLENOL) 325 MG tablet Take 650 mg by mouth every 6 (six) hours as needed.        Marland Kitchen amitriptyline (ELAVIL) 100 MG tablet Take 200 mg by mouth at bedtime.        . budesonide-formoterol (SYMBICORT) 80-4.5 MCG/ACT inhaler Inhale 2 puffs into the lungs 2 (two) times daily.        Marland Kitchen dexamethasone (DECADRON) 4 MG tablet Take 4 mg by mouth as directed. 1 tab BID day before of and after chemo       . diltiazem (DILACOR XR) 120 MG 24 hr  capsule Take 120 mg by mouth daily.        . folic acid (FOLVITE) 1 MG tablet Take 1 mg by mouth daily.        . prochlorperazine (COMPAZINE) 10 MG tablet Take 10 mg by mouth every 6 (six) hours as needed.        . Varenicline Tartrate (CHANTIX PO) Take by mouth daily. Pt unsure of dose        No current facility-administered medications for this visit.   Facility-Administered Medications Ordered in Other Visits  Medication Dose Route Frequency Provider Last Rate Last Dose  . DISCONTD: cyanocobalamin ((VITAMIN B-12)) injection 1,000 mcg  1,000 mcg Intramuscular Once Mohamed K. Mohamed, MD      . DISCONTD: sodium chloride 0.9 % injection 10 mL  10 mL Intracatheter PRN Mohamed K. Arbutus Ped, MD   10 mL at 06/03/11 1635    SURGICAL HISTORY:  Past Surgical History  Procedure Date  . Carpal tunnel release   . Left knee surgery   . Cholecystectomy   . Partial hysterectomy     REVIEW OF SYSTEMS:  A comprehensive review of systems was negative except for: Respiratory: positive for cough Gastrointestinal: positive for vomiting   PHYSICAL EXAMINATION: General appearance: alert, cooperative and no distress Head: Normocephalic, without obvious abnormality, atraumatic Neck: no adenopathy Lymph nodes: Cervical, supraclavicular, and axillary nodes normal. Resp: clear to auscultation bilaterally Cardio: regular rate and rhythm,  S1, S2 normal, no murmur, click, rub or gallop GI: soft, non-tender; bowel sounds normal; no masses,  no organomegaly Extremities: extremities normal, atraumatic, no cyanosis or edema Neurologic: Alert and oriented X 3, normal strength and tone. Normal symmetric reflexes. Normal coordination and gait  ECOG PERFORMANCE STATUS: 1 - Symptomatic but completely ambulatory  Blood pressure 143/84, pulse 90, temperature 97.3 F (36.3 C), temperature source Oral, height 5\' 3"  (1.6 m), weight 179 lb (81.194 kg).  LABORATORY DATA: Lab Results  Component Value Date   WBC 9.9  06/03/2011   HGB 14.0 06/03/2011   HCT 40.1 06/03/2011   MCV 93.7 06/03/2011   PLT 222 06/03/2011      Chemistry      Component Value Date/Time   NA 138 06/03/2011 1149   NA 144 04/11/2011 1200   K 4.3 06/03/2011 1149   K 3.8 04/11/2011 1200   CL 103 06/03/2011 1149   CL 98 04/11/2011 1200   CO2 24 06/03/2011 1149   CO2 29 04/11/2011 1200   BUN 20 06/03/2011 1149   BUN 13 04/11/2011 1200   CREATININE 0.74 06/03/2011 1149   CREATININE 1.0 04/11/2011 1200      Component Value Date/Time   CALCIUM 9.6 06/03/2011 1149   CALCIUM 9.2 04/11/2011 1200   ALKPHOS 112 06/03/2011 1149   ALKPHOS 90* 04/11/2011 1200   AST 21 06/03/2011 1149   AST 21 04/11/2011 1200   ALT 28 06/03/2011 1149   BILITOT 0.2* 06/03/2011 1149   BILITOT 0.50 04/11/2011 1200       ASSESSMENT/PLAN: This is a very pleasant 60 years old white female with metastatic non-small cell lung cancer, adenocarcinoma, currently on treatment with carboplatin and Alimta, status post 2 cycles. Patient was discussed with Dr. Arbutus Ped. She'll proceed with her third cycle of systemic chemotherapy with carboplatin and Alimta. She'll followup with Dr. Arbutus Ped in 3 weeks with a repeat CBC differential C. met and CT of the chest abdomen and pelvis with contrast to reevaluate her disease. She is encouraged to discontinue smoking.  All questions were answered. The patient knows to call the clinic with any problems, questions or concerns. We can certainly see the patient much sooner if necessary.

## 2011-06-05 ENCOUNTER — Telehealth: Payer: Self-pay | Admitting: Internal Medicine

## 2011-06-05 NOTE — Telephone Encounter (Signed)
Called pt and left meddage regarding CT and lab on 12/18, requested pt to come and pick up oral contrast before the day of scan

## 2011-06-07 ENCOUNTER — Telehealth: Payer: Self-pay | Admitting: Internal Medicine

## 2011-06-07 NOTE — Telephone Encounter (Signed)
Pt called left vm regarding CT , called pt again, left another message. Left last message 12/13th

## 2011-06-10 ENCOUNTER — Encounter (HOSPITAL_COMMUNITY): Payer: Self-pay

## 2011-06-10 ENCOUNTER — Ambulatory Visit (HOSPITAL_COMMUNITY)
Admission: RE | Admit: 2011-06-10 | Discharge: 2011-06-10 | Disposition: A | Discharge status: Home / Self Care | Payer: Medicare Other | Source: Ambulatory Visit | Attending: Physician Assistant | Admitting: Physician Assistant

## 2011-06-10 ENCOUNTER — Other Ambulatory Visit (HOSPITAL_BASED_OUTPATIENT_CLINIC_OR_DEPARTMENT_OTHER): Payer: Medicare Other

## 2011-06-10 DIAGNOSIS — C7931 Secondary malignant neoplasm of brain: Secondary | ICD-10-CM | POA: Insufficient documentation

## 2011-06-10 DIAGNOSIS — S32009A Unspecified fracture of unspecified lumbar vertebra, initial encounter for closed fracture: Secondary | ICD-10-CM | POA: Insufficient documentation

## 2011-06-10 DIAGNOSIS — Z9089 Acquired absence of other organs: Secondary | ICD-10-CM | POA: Insufficient documentation

## 2011-06-10 DIAGNOSIS — R911 Solitary pulmonary nodule: Secondary | ICD-10-CM | POA: Insufficient documentation

## 2011-06-10 DIAGNOSIS — X58XXXA Exposure to other specified factors, initial encounter: Secondary | ICD-10-CM | POA: Insufficient documentation

## 2011-06-10 DIAGNOSIS — J438 Other emphysema: Secondary | ICD-10-CM | POA: Insufficient documentation

## 2011-06-10 DIAGNOSIS — C3490 Malignant neoplasm of unspecified part of unspecified bronchus or lung: Secondary | ICD-10-CM | POA: Insufficient documentation

## 2011-06-10 DIAGNOSIS — K7689 Other specified diseases of liver: Secondary | ICD-10-CM | POA: Insufficient documentation

## 2011-06-10 DIAGNOSIS — I714 Abdominal aortic aneurysm, without rupture, unspecified: Secondary | ICD-10-CM | POA: Insufficient documentation

## 2011-06-10 LAB — CBC WITH DIFFERENTIAL/PLATELET
EOS%: 0.9 % (ref 0.0–7.0)
Eosinophils Absolute: 0 10*3/uL (ref 0.0–0.5)
LYMPH%: 41.3 % (ref 14.0–49.7)
MCH: 32 pg (ref 25.1–34.0)
MCHC: 34 g/dL (ref 31.5–36.0)
MCV: 94.2 fL (ref 79.5–101.0)
MONO%: 10.1 % (ref 0.0–14.0)
NEUT#: 2.2 10*3/uL (ref 1.5–6.5)
Platelets: 130 10*3/uL — ABNORMAL LOW (ref 145–400)
RBC: 4.34 10*6/uL (ref 3.70–5.45)
RDW: 13.5 % (ref 11.2–14.5)

## 2011-06-10 LAB — COMPREHENSIVE METABOLIC PANEL
AST: 40 U/L — ABNORMAL HIGH (ref 0–37)
Albumin: 3.8 g/dL (ref 3.5–5.2)
Alkaline Phosphatase: 95 U/L (ref 39–117)
Glucose, Bld: 109 mg/dL — ABNORMAL HIGH (ref 70–99)
Potassium: 4.3 mEq/L (ref 3.5–5.3)
Sodium: 139 mEq/L (ref 135–145)
Total Bilirubin: 0.3 mg/dL (ref 0.3–1.2)
Total Protein: 6.7 g/dL (ref 6.0–8.3)

## 2011-06-10 MED ORDER — IOHEXOL 300 MG/ML  SOLN
100.0000 mL | Freq: Once | INTRAMUSCULAR | Status: AC | PRN
  Administered 2011-06-10: 100 mL via INTRAVENOUS

## 2011-06-18 ENCOUNTER — Other Ambulatory Visit (HOSPITAL_BASED_OUTPATIENT_CLINIC_OR_DEPARTMENT_OTHER): Payer: Medicare Other

## 2011-06-18 DIAGNOSIS — C3490 Malignant neoplasm of unspecified part of unspecified bronchus or lung: Secondary | ICD-10-CM

## 2011-06-18 DIAGNOSIS — C7931 Secondary malignant neoplasm of brain: Secondary | ICD-10-CM

## 2011-06-18 LAB — CBC WITH DIFFERENTIAL/PLATELET
BASO%: 0.6 % (ref 0.0–2.0)
Basophils Absolute: 0.1 10*3/uL (ref 0.0–0.1)
EOS%: 0.4 % (ref 0.0–7.0)
HGB: 14.3 g/dL (ref 11.6–15.9)
MCH: 32.5 pg (ref 25.1–34.0)
MCV: 96.3 fL (ref 79.5–101.0)
MONO%: 10.4 % (ref 0.0–14.0)
RBC: 4.4 10*6/uL (ref 3.70–5.45)
RDW: 14.3 % (ref 11.2–14.5)
lymph#: 1.6 10*3/uL (ref 0.9–3.3)

## 2011-06-18 LAB — COMPREHENSIVE METABOLIC PANEL
ALT: 33 U/L (ref 0–35)
AST: 22 U/L (ref 0–37)
Albumin: 3.4 g/dL — ABNORMAL LOW (ref 3.5–5.2)
Alkaline Phosphatase: 123 U/L — ABNORMAL HIGH (ref 39–117)
BUN: 20 mg/dL (ref 6–23)
Calcium: 10 mg/dL (ref 8.4–10.5)
Chloride: 105 mEq/L (ref 96–112)
Potassium: 4.3 mEq/L (ref 3.5–5.3)
Sodium: 140 mEq/L (ref 135–145)
Total Protein: 7.9 g/dL (ref 6.0–8.3)

## 2011-06-20 ENCOUNTER — Other Ambulatory Visit: Payer: Self-pay | Admitting: *Deleted

## 2011-06-20 DIAGNOSIS — R52 Pain, unspecified: Secondary | ICD-10-CM

## 2011-06-20 MED ORDER — OXYCODONE-ACETAMINOPHEN 5-325 MG PO TABS: 1.0000 | ORAL_TABLET | Freq: Four times a day (QID) | ORAL | Status: AC | PRN

## 2011-06-20 NOTE — Telephone Encounter (Signed)
Pt called statin she is having increased pain in her cback that is radiating to her chest.  She stated that it is also radiating down her left arm as well.  Tylenol helped relieve the pain but now it is not working.  Per Dr Donnald Garre, okay to give pt percocet.  Pt verbalized understanding but advised that if pain does not get better and continues to progress she may need to go to the ED for evaluation.  SLJ

## 2011-06-24 ENCOUNTER — Other Ambulatory Visit: Payer: Self-pay | Admitting: Internal Medicine

## 2011-06-25 ENCOUNTER — Ambulatory Visit (HOSPITAL_BASED_OUTPATIENT_CLINIC_OR_DEPARTMENT_OTHER): Payer: Medicare Other | Admitting: Internal Medicine

## 2011-06-25 ENCOUNTER — Telehealth: Payer: Self-pay | Admitting: Internal Medicine

## 2011-06-25 ENCOUNTER — Ambulatory Visit: Payer: Medicare Other

## 2011-06-25 ENCOUNTER — Other Ambulatory Visit: Payer: Medicare Other | Admitting: Lab

## 2011-06-25 VITALS — BP 133/82 | HR 97 | Temp 97.8°F | Ht 63.0 in | Wt 183.1 lb

## 2011-06-25 DIAGNOSIS — C3490 Malignant neoplasm of unspecified part of unspecified bronchus or lung: Secondary | ICD-10-CM

## 2011-06-25 DIAGNOSIS — C7949 Secondary malignant neoplasm of other parts of nervous system: Secondary | ICD-10-CM

## 2011-06-25 DIAGNOSIS — C779 Secondary and unspecified malignant neoplasm of lymph node, unspecified: Secondary | ICD-10-CM

## 2011-06-25 LAB — CBC WITH DIFFERENTIAL/PLATELET
BASO%: 0.1 % (ref 0.0–2.0)
Basophils Absolute: 0 10*3/uL (ref 0.0–0.1)
EOS%: 0 % (ref 0.0–7.0)
HGB: 14.1 g/dL (ref 11.6–15.9)
MCH: 32.6 pg (ref 25.1–34.0)
MCHC: 34.6 g/dL (ref 31.5–36.0)
MCV: 94.4 fL (ref 79.5–101.0)
MONO%: 2 % (ref 0.0–14.0)
RBC: 4.32 10*6/uL (ref 3.70–5.45)
RDW: 13.8 % (ref 11.2–14.5)
lymph#: 0.7 10*3/uL — ABNORMAL LOW (ref 0.9–3.3)

## 2011-06-25 NOTE — Progress Notes (Signed)
[facility] OFFICE PROGRESS NOTE  DIAGNOSIS: Metastatic non-small cell lung cancer, favoring adenocarcinoma diagnosed in July of 2012, presented with locally advanced disease in the chest as well as brain metastasis.   PRIOR THERAPY:  1. Status post stereotactic radiotherapy to 2 brain lesions under the care of Dr. Mitzi Hansen. 2. Status post a course of concurrent chemoradiation with weekly carboplatin and paclitaxel, last dose of chemotherapy was given 03/05/2011.  CURRENT THERAPY: Systemic chemotherapy with carboplatin for AUC of 5 and Alimta 500 mg/M2. The patient is status post 3 cycles.   INTERVAL HISTORY: Courtney West 61 y.o. female returns to the clinic today for followup visit accompanied by her daughter. The patient her to the last cycle of her chemotherapy fairly well. She continues to have significant itching with carboplatin and this was discontinued at the last cycle. She denied having any significant nausea or vomiting. No chest pain or shortness of breath. No significant weight loss. Unfortunately she continues to smoke and I strongly encouraged her to quit smoking. She has repeat CT scan of the chest, abdomen and pelvis performed recently and she is here today for evaluation and discussion of her scan results.  MEDICAL HISTORY: Past Medical History  Diagnosis Date  . Hypertension   . COPD (chronic obstructive pulmonary disease)   . Stroke   . Abdominal aortic aneurysm   . Dyslipidemia   . S/P radiation therapy 7/12 thru 9/12, 04/18/11    xrt to brain mets  . Lung cancer 12/2010  . Brain cancer     mets from lung primary    ALLERGIES:  is allergic to carboplatin; codeine; penicillins; and sulfa antibiotics.  MEDICATIONS:  Current Outpatient Prescriptions  Medication Sig Dispense Refill  . acetaminophen (TYLENOL) 325 MG tablet Take 650 mg by mouth every 6 (six) hours as needed.        Marland Kitchen amitriptyline (ELAVIL) 100 MG tablet Take 200 mg by mouth at bedtime.         . budesonide-formoterol (SYMBICORT) 80-4.5 MCG/ACT inhaler Inhale 2 puffs into the lungs 2 (two) times daily.        Marland Kitchen dexamethasone (DECADRON) 4 MG tablet Take 4 mg by mouth as directed. 1 tab BID day before of and after chemo       . diltiazem (DILACOR XR) 120 MG 24 hr capsule Take 120 mg by mouth daily.        . folic acid (FOLVITE) 1 MG tablet Take 1 mg by mouth daily.        Marland Kitchen oxyCODONE-acetaminophen (PERCOCET) 5-325 MG per tablet Take 1 tablet by mouth every 6 (six) hours as needed for pain.  30 tablet  0  . prochlorperazine (COMPAZINE) 10 MG tablet Take 10 mg by mouth every 6 (six) hours as needed.        . Varenicline Tartrate (CHANTIX PO) Take by mouth daily. Pt unsure of dose         SURGICAL HISTORY:  Past Surgical History  Procedure Date  . Carpal tunnel release   . Left knee surgery   . Cholecystectomy   . Partial hysterectomy     REVIEW OF SYSTEMS:  A comprehensive review of systems was negative.   PHYSICAL EXAMINATION: General appearance: alert, cooperative and no distress Head: Normocephalic, without obvious abnormality, atraumatic Neck: no adenopathy Lymph nodes: Cervical, supraclavicular, and axillary nodes normal. Resp: clear to auscultation bilaterally Cardio: regular rate and rhythm, S1, S2 normal, no murmur, click, rub or gallop GI: soft,  non-tender; bowel sounds normal; no masses,  no organomegaly Extremities: extremities normal, atraumatic, no cyanosis or edema Neurologic: Alert and oriented X 3, normal strength and tone. Normal symmetric reflexes. Normal coordination and gait  ECOG PERFORMANCE STATUS: 1 - Symptomatic but completely ambulatory  Blood pressure 133/82, pulse 97, temperature 97.8 F (36.6 C), temperature source Oral, height 5\' 3"  (1.6 m), weight 183 lb 1.6 oz (83.054 kg).  LABORATORY DATA: Lab Results  Component Value Date   WBC 10.7* 06/25/2011   HGB 14.1 06/25/2011   HCT 40.8 06/25/2011   MCV 94.4 06/25/2011   PLT 317 06/25/2011       Chemistry      Component Value Date/Time   NA 140 06/18/2011 1604   NA 144 04/11/2011 1200   K 4.3 06/18/2011 1604   K 3.8 04/11/2011 1200   CL 105 06/18/2011 1604   CL 98 04/11/2011 1200   CO2 28 06/18/2011 1604   CO2 29 04/11/2011 1200   BUN 20 06/18/2011 1604   BUN 13 04/11/2011 1200   CREATININE 1.09 06/18/2011 1604   CREATININE 1.0 04/11/2011 1200      Component Value Date/Time   CALCIUM 10.0 06/18/2011 1604   CALCIUM 9.2 04/11/2011 1200   ALKPHOS 123* 06/18/2011 1604   ALKPHOS 90* 04/11/2011 1200   AST 22 06/18/2011 1604   AST 21 04/11/2011 1200   ALT 33 06/18/2011 1604   BILITOT 0.1* 06/18/2011 1604   BILITOT 0.50 04/11/2011 1200       RADIOGRAPHIC STUDIES: Ct Chest W Contrast  06/10/2011  *RADIOLOGY REPORT*  Clinical Data:  Lung cancer with metastatic involvement of the brain.  CT CHEST AND ABDOMEN WITH CONTRAST  Technique:  Multidetector CT imaging of the chest and abdomen was performed following the standard protocol during bolus administration of intravenous contrast.  Contrast: OMNIPAQUE IOHEXOL 300 MG/ML IV SOLN  Comparison:  Multiple exams, including 04/11/2011   CT CHEST  Findings:  Right Port-A-Cath noted.  Further reduction in size of the left hilum masses noted.  The lesion measures 2.0 x 1.1 cm (formerly 2.8 x 2.3 cm) on image 29 of series 2, and there may be minimal residual left hilar nodal prominence. The right hilar node has a short axis dimension of 0.8 cm (previously 1.3 cm).  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 Prominent centrilobular emphysema noted.  A right lower lobe pulmonary nodule measures 1.5 x 1.0 cm and by my measurements previously measured the same.  A stable 4 mm peripheral pulmonary nodule in the right upper lobe is shown on image 13 of series 4.  There is some mildly increased volume loss  in the lingula. The subtle nodularity in the left upper lobe shown on the prior exam has resolved.  IMPRESSION:  1.  Reduced size of left hilar mass and reduced size of right hilar adenopathy. 2.  Stable right lower lobe pulmonary nodule. 3.  Prominent centrilobular emphysema. 4.  Mildly increased volume loss in the lingula.   CT ABDOMEN  Findings:  Diffuse hepatic steatosis and prior cholecystectomy observed.  Peripheral splenic scarring appears stable.  The pancreas and adrenal glands appear unremarkable.  Fusiform infrarenal abdominal aortic aneurysm is again noted with chronically calcified mural component aneurysm dimension is 5.0 cm transverse by 4.6 cm anterior - posterior on image 69 of series 2.  The kidneys appear unremarkable.  Stable compression fracture  of L2 noted with partial fusion of L1 and L2.  IMPRESSION:  1.  No findings metastatic disease to the upper abdomen. 2.  Stable fusiform infrarenal abdominal aortic aneurysm. 3.  Stable appearance of compression fracture at the L2 level. 4.  Diffuse hepatic steatosis.  Original Report Authenticated By: Dellia Cloud, M.D.    ASSESSMENT: This is a very pleasant 61 years old white female with metastatic non-small cell lung cancer was 2 brain lesions and addition to locally advanced disease in the chest. The patient is status post stereotactic radiotherapy to the brain lesions and addition to concurrent chemoradiation to the chest followed by consolidation chemotherapy. She continues to have improvement of her disease in the chest. I discussed the scan results with the patient and her daughter. I given her the option of continuing systemic chemotherapy with maintenance Alimta versus observation. The patient would like to take a break from treatment.  PLAN: I would see her back for followup visit in 3 months with repeat CT scan of the chest, abdomen and pelvis. She was advised to call immediately she has any concerning symptoms in the  interval.  All questions were answered. The patient knows to call the clinic with any problems, questions or concerns. We can certainly see the patient much sooner if necessary.

## 2011-06-25 NOTE — Telephone Encounter (Signed)
rx cx for today and lab and ct appt made for 4/2 and to see mkm  On 4/9,printed and contrast to pt   aom

## 2011-07-01 ENCOUNTER — Telehealth: Payer: Self-pay | Admitting: Internal Medicine

## 2011-07-01 NOTE — Telephone Encounter (Signed)
Requests medication for muscle spasms so " I don't get addicted to these pain killers" Dr Donnald Garre to advise

## 2011-07-02 ENCOUNTER — Emergency Department (HOSPITAL_COMMUNITY)
Admission: EM | Admit: 2011-07-02 | Discharge: 2011-07-02 | Disposition: A | Discharge status: Home / Self Care | Payer: Medicare Other | Attending: Emergency Medicine | Admitting: Emergency Medicine

## 2011-07-02 ENCOUNTER — Encounter (HOSPITAL_COMMUNITY): Payer: Self-pay

## 2011-07-02 ENCOUNTER — Emergency Department (HOSPITAL_COMMUNITY): Payer: Medicare Other

## 2011-07-02 DIAGNOSIS — M2420 Disorder of ligament, unspecified site: Secondary | ICD-10-CM | POA: Insufficient documentation

## 2011-07-02 DIAGNOSIS — Z8673 Personal history of transient ischemic attack (TIA), and cerebral infarction without residual deficits: Secondary | ICD-10-CM | POA: Insufficient documentation

## 2011-07-02 DIAGNOSIS — Z8051 Family history of malignant neoplasm of kidney: Secondary | ICD-10-CM | POA: Insufficient documentation

## 2011-07-02 DIAGNOSIS — J449 Chronic obstructive pulmonary disease, unspecified: Secondary | ICD-10-CM | POA: Insufficient documentation

## 2011-07-02 DIAGNOSIS — Z9889 Other specified postprocedural states: Secondary | ICD-10-CM | POA: Insufficient documentation

## 2011-07-02 DIAGNOSIS — M629 Disorder of muscle, unspecified: Secondary | ICD-10-CM | POA: Insufficient documentation

## 2011-07-02 DIAGNOSIS — C3490 Malignant neoplasm of unspecified part of unspecified bronchus or lung: Secondary | ICD-10-CM | POA: Insufficient documentation

## 2011-07-02 DIAGNOSIS — F17200 Nicotine dependence, unspecified, uncomplicated: Secondary | ICD-10-CM | POA: Insufficient documentation

## 2011-07-02 DIAGNOSIS — C7951 Secondary malignant neoplasm of bone: Secondary | ICD-10-CM | POA: Insufficient documentation

## 2011-07-02 DIAGNOSIS — Z806 Family history of leukemia: Secondary | ICD-10-CM | POA: Insufficient documentation

## 2011-07-02 DIAGNOSIS — M549 Dorsalgia, unspecified: Secondary | ICD-10-CM

## 2011-07-02 DIAGNOSIS — Z9079 Acquired absence of other genital organ(s): Secondary | ICD-10-CM | POA: Insufficient documentation

## 2011-07-02 DIAGNOSIS — E785 Hyperlipidemia, unspecified: Secondary | ICD-10-CM | POA: Insufficient documentation

## 2011-07-02 DIAGNOSIS — M25519 Pain in unspecified shoulder: Secondary | ICD-10-CM | POA: Insufficient documentation

## 2011-07-02 DIAGNOSIS — J4489 Other specified chronic obstructive pulmonary disease: Secondary | ICD-10-CM | POA: Insufficient documentation

## 2011-07-02 DIAGNOSIS — I10 Essential (primary) hypertension: Secondary | ICD-10-CM | POA: Insufficient documentation

## 2011-07-02 MED ORDER — HYDROMORPHONE HCL PF 1 MG/ML IJ SOLN
1.0000 mg | Freq: Once | INTRAMUSCULAR | Status: AC
  Administered 2011-07-02: 1 mg via INTRAMUSCULAR
  Filled 2011-07-02: qty 1

## 2011-07-02 MED ORDER — HYDROMORPHONE HCL 2 MG PO TABS: 2.0000 mg | ORAL_TABLET | ORAL | Status: AC | PRN

## 2011-07-02 NOTE — ED Notes (Signed)
Pt reports approx 1 1/2 weeks ago started having pain in left shoulder blade and now also hurts in neck and left shoulder.  PT has been using a heating pad and taking tylenol prn.  Reports saw her cancer doctor Wednesday and was given oxycodone for the pain.  Pt says the pain pills make her sleep but doesn't take the pain away.   Pt says her cancer doctor told her it was muscle spasms.  Pt reports was diagnosed with lung cancer last August.

## 2011-07-02 NOTE — ED Notes (Signed)
Dr. Freida Busman notified  Of pt, ekg shown to edp.

## 2011-07-02 NOTE — ED Provider Notes (Cosign Needed)
History    This chart was scribed for Benny Lennert, MD, MD by Smitty Pluck. The patient was seen in room APA19 and the patient's care was started at 9:18PM.   CSN: [id number]  Arrival date & time 07/02/11  4098   First MD Initiated Contact with Patient 07/02/11 2116      Chief Complaint  Patient presents with  . Shoulder Pain    (Consider location/radiation/quality/duration/timing/severity/associated sxs/prior treatment) Patient is a 61 y.o. female presenting with shoulder pain. The history is provided by the patient.  Shoulder Pain   Courtney West is a 61 y.o. female who presents to the Emergency Department complaining of moderate left shoulder blade pain radiating throughout back and neck onset 1.5 weeks ago. Pt has been taking Tylenol and using heating pad without relief. Pt was diagnosed with lung cancer last August. The patients cancer doctor told pt that the pain was due to muscle spasms and gave her oxycodone but she reports the pain is still present after taking them.   Past Medical History  Diagnosis Date  . Hypertension   . COPD (chronic obstructive pulmonary disease)   . Stroke   . Abdominal aortic aneurysm   . Dyslipidemia   . S/P radiation therapy 7/12 thru 9/12, 04/18/11    xrt to brain mets  . Lung cancer 12/2010  . Brain cancer     mets from lung primary    Past Surgical History  Procedure Date  . Carpal tunnel release   . Left knee surgery   . Cholecystectomy   . Partial hysterectomy     Family History  Problem Relation Age of Onset  . Cancer Father     lung, leukemia  . Cancer Maternal Aunt     kidney    History  Substance Use Topics  . Smoking status: Current Everyday Smoker -- 1.0 packs/day for 47 years    Types: Cigarettes  . Smokeless tobacco: Not on file  . Alcohol Use: Yes     occassional    OB History    Grav Para Term Preterm Abortions TAB SAB Ect Mult Living                  Review of Systems  All other systems reviewed  and are negative.  10 Systems reviewed and are negative for acute change except as noted in the HPI.   Allergies  Carboplatin; Codeine; Penicillins; and Sulfa antibiotics  Home Medications   Current Outpatient Rx  Name Route Sig Dispense Refill  . ACETAMINOPHEN 325 MG PO TABS Oral Take 650 mg by mouth every 6 (six) hours as needed. For pain    . AMITRIPTYLINE HCL 100 MG PO TABS Oral Take 200 mg by mouth at bedtime.      . BUDESONIDE-FORMOTEROL FUMARATE 80-4.5 MCG/ACT IN AERO Inhalation Inhale 2 puffs into the lungs 2 (two) times daily.      . CYCLOBENZAPRINE HCL 10 MG PO TABS Oral Take 10 mg by mouth at bedtime.    Marland Kitchen DEXAMETHASONE 4 MG PO TABS Oral Take 4 mg by mouth as directed. 1 tab BID day before of and after chemo     . DILTIAZEM HCL 120 MG PO CP24 Oral Take 120 mg by mouth daily.      Marland Kitchen FOLIC ACID 1 MG PO TABS Oral Take 1 mg by mouth daily.      . OXYCODONE-ACETAMINOPHEN 5-325 MG PO TABS Oral Take 1 tablet by mouth every 6 (six) hours  as needed. For pain    . PROCHLORPERAZINE MALEATE 10 MG PO TABS Oral Take 10 mg by mouth every 6 (six) hours as needed. For nausea      BP 152/79  Pulse 87  Temp(Src) 98.7 F (37.1 C) (Oral)  Resp 20  Ht 5\' 3"  (1.6 m)  Wt 183 lb (83.008 kg)  BMI 32.42 kg/m2  SpO2 92%  Physical Exam  Nursing note and vitals reviewed. Constitutional: She is oriented to person, place, and time. She appears well-developed and well-nourished.  HENT:  Head: Normocephalic and atraumatic.  Eyes: Conjunctivae and EOM are normal. No scleral icterus.  Neck: Neck supple. No thyromegaly present.  Cardiovascular: Normal rate and regular rhythm.  Exam reveals no gallop and no friction rub.   No murmur heard. Pulmonary/Chest: No stridor. She has no wheezes. She has no rales. She exhibits no tenderness.  Abdominal: She exhibits no distension. There is no tenderness. There is no rebound.  Musculoskeletal: Normal range of motion. She exhibits tenderness (left trapezius ).  She exhibits no edema.  Lymphadenopathy:    She has no cervical adenopathy.  Neurological: She is oriented to person, place, and time. Coordination normal.  Skin: No rash noted. No erythema.  Psychiatric: She has a normal mood and affect. Her behavior is normal.    ED Course  Procedures (including critical care time)  DIAGNOSTIC STUDIES: Oxygen Saturation is 95% on room air, normal by my interpretation.    COORDINATION OF CARE:  10:31PM: Recheck: EDP discussed lab results and treatment course with pt. Pt is ready for discharge.   Labs Reviewed - No data to display Dg Chest 2 View  07/02/2011  *RADIOLOGY REPORT*  Clinical Data: Upper back pain.  History of smoker and COPD.  Left- sided lung cancer.  CHEST - 2 VIEW  Comparison: 12/24/2010.  Chest CT 06/10/2011.  Findings: Compared to the recent prior chest CT, no interval change in the size of pulmonary hilum.  Compared to the prior chest radiograph, there has been a decrease in size of the left hilar mass.  Right IJ power port is present.  The appearance of the left lung is unchanged.  There is patchy density at the left lung base.  If the patient has undergone radiation therapy, this could represent post radiation changes however pneumonia is not excluded.  If the patient is immunosuppressed, consider empiric treatment for pneumonia.  IMPRESSION: 1.  Unchanged right IJ power port. 2.  Bilateral hilar fullness with stable size of the left hilum in this patient with known lung cancer. 3.  Left basilar patchy density which could represent post radiation changes.  Pneumonia is a definite consideration, particularly with immunosuppression is present.  Original Report Authenticated By: Andreas Newport, M.D.     No diagnosis found.    MDM  Back pain from muscle inflamation.   Pt very tender in left trapezius muscle      The chart was scribed for me under my direct supervision.  I personally performed the history, physical, and medical  decision making and all procedures in the evaluation of this patient.Benny Lennert, MD 07/02/11 2235

## 2011-07-02 NOTE — ED Notes (Signed)
Pt also c/o pain in left side and under left breast.

## 2011-07-02 NOTE — ED Notes (Addendum)
Sister to drive her home

## 2011-07-03 NOTE — Telephone Encounter (Signed)
May need to get this Rx from PCP.

## 2011-07-04 NOTE — Telephone Encounter (Signed)
Left message for pt to call.

## 2011-07-09 ENCOUNTER — Telehealth: Payer: Self-pay | Admitting: Internal Medicine

## 2011-07-09 NOTE — Telephone Encounter (Signed)
Pt returned my call from several days ago . She called her primary care for a medication for muscle spasms

## 2011-07-15 ENCOUNTER — Telehealth: Payer: Self-pay | Admitting: Internal Medicine

## 2011-07-15 NOTE — Telephone Encounter (Signed)
Asking for oxycodone refill -Per Adrena I told pt to see her primary care provider - the patient voices understanding

## 2011-07-16 ENCOUNTER — Ambulatory Visit
Admission: RE | Admit: 2011-07-16 | Discharge: 2011-07-16 | Disposition: A | Discharge status: Home / Self Care | Payer: Medicare Other | Source: Ambulatory Visit | Attending: Radiation Oncology | Admitting: Radiation Oncology

## 2011-07-16 DIAGNOSIS — C719 Malignant neoplasm of brain, unspecified: Secondary | ICD-10-CM

## 2011-07-16 MED ORDER — GADOBENATE DIMEGLUMINE 529 MG/ML IV SOLN
17.0000 mL | Freq: Once | INTRAVENOUS | Status: AC | PRN
  Administered 2011-07-16: 17 mL via INTRAVENOUS

## 2011-07-18 ENCOUNTER — Other Ambulatory Visit: Payer: Medicare Other

## 2011-07-29 ENCOUNTER — Other Ambulatory Visit: Payer: Self-pay

## 2011-07-29 ENCOUNTER — Encounter (HOSPITAL_COMMUNITY): Payer: Self-pay

## 2011-07-29 ENCOUNTER — Emergency Department (HOSPITAL_COMMUNITY)
Admission: EM | Admit: 2011-07-29 | Discharge: 2011-07-30 | Disposition: A | Discharge status: Home / Self Care | Payer: Medicare Other | Attending: Emergency Medicine | Admitting: Emergency Medicine

## 2011-07-29 DIAGNOSIS — J449 Chronic obstructive pulmonary disease, unspecified: Secondary | ICD-10-CM | POA: Insufficient documentation

## 2011-07-29 DIAGNOSIS — E785 Hyperlipidemia, unspecified: Secondary | ICD-10-CM | POA: Insufficient documentation

## 2011-07-29 DIAGNOSIS — Z8673 Personal history of transient ischemic attack (TIA), and cerebral infarction without residual deficits: Secondary | ICD-10-CM | POA: Insufficient documentation

## 2011-07-29 DIAGNOSIS — C3490 Malignant neoplasm of unspecified part of unspecified bronchus or lung: Secondary | ICD-10-CM | POA: Insufficient documentation

## 2011-07-29 DIAGNOSIS — J4489 Other specified chronic obstructive pulmonary disease: Secondary | ICD-10-CM | POA: Insufficient documentation

## 2011-07-29 DIAGNOSIS — R079 Chest pain, unspecified: Secondary | ICD-10-CM | POA: Insufficient documentation

## 2011-07-29 DIAGNOSIS — Z85841 Personal history of malignant neoplasm of brain: Secondary | ICD-10-CM | POA: Insufficient documentation

## 2011-07-29 DIAGNOSIS — M549 Dorsalgia, unspecified: Secondary | ICD-10-CM | POA: Insufficient documentation

## 2011-07-29 DIAGNOSIS — I10 Essential (primary) hypertension: Secondary | ICD-10-CM | POA: Insufficient documentation

## 2011-07-29 DIAGNOSIS — F17200 Nicotine dependence, unspecified, uncomplicated: Secondary | ICD-10-CM | POA: Insufficient documentation

## 2011-07-29 DIAGNOSIS — R748 Abnormal levels of other serum enzymes: Secondary | ICD-10-CM | POA: Insufficient documentation

## 2011-07-29 NOTE — ED Notes (Signed)
Pt states that she has some back pain that radiates under left breast. Pt gives pain 7/10 that she notices when she coughs. Pt with lump under left breast states that she has MRI of brain that was suppose to be scheduled however appointment has not been made yet. Breath sounds clear and equal. No distress noted at this time.

## 2011-07-29 NOTE — ED Notes (Signed)
Pt c/o upper left back pain that radiates to under left breast. Pt newly diagnosed lung ca August 2012 pt also states she has brain CA as well. resp even/nonlabored. Lungs cta. nad noted.

## 2011-07-30 ENCOUNTER — Emergency Department (HOSPITAL_COMMUNITY): Payer: Medicare Other

## 2011-07-30 ENCOUNTER — Other Ambulatory Visit: Payer: Self-pay | Admitting: Neurosurgery

## 2011-07-30 DIAGNOSIS — M549 Dorsalgia, unspecified: Secondary | ICD-10-CM

## 2011-07-30 LAB — CBC
MCH: 31.8 pg (ref 26.0–34.0)
MCV: 96.8 fL (ref 78.0–100.0)
Platelets: 183 10*3/uL (ref 150–400)
RBC: 3.8 MIL/uL — ABNORMAL LOW (ref 3.87–5.11)
RDW: 13.2 % (ref 11.5–15.5)
WBC: 6.7 10*3/uL (ref 4.0–10.5)

## 2011-07-30 LAB — COMPREHENSIVE METABOLIC PANEL
ALT: 12 U/L (ref 0–35)
AST: 13 U/L (ref 0–37)
Albumin: 3.1 g/dL — ABNORMAL LOW (ref 3.5–5.2)
Calcium: 9.6 mg/dL (ref 8.4–10.5)
Chloride: 106 mEq/L (ref 96–112)
Creatinine, Ser: 1.09 mg/dL (ref 0.50–1.10)
Sodium: 142 mEq/L (ref 135–145)

## 2011-07-30 MED ORDER — HYDROMORPHONE HCL PF 1 MG/ML IJ SOLN
1.0000 mg | Freq: Once | INTRAMUSCULAR | Status: AC
  Administered 2011-07-30: 1 mg via INTRAVENOUS
  Filled 2011-07-30: qty 1

## 2011-07-30 MED ORDER — ONDANSETRON HCL 4 MG/2ML IJ SOLN
4.0000 mg | Freq: Once | INTRAMUSCULAR | Status: AC
  Administered 2011-07-30: 4 mg via INTRAVENOUS
  Filled 2011-07-30: qty 2

## 2011-07-30 MED ORDER — HEPARIN SOD (PORK) LOCK FLUSH 100 UNIT/ML IV SOLN
500.0000 [IU] | Freq: Once | INTRAVENOUS | Status: AC
  Administered 2011-07-30: 500 [IU] via INTRAVENOUS
  Filled 2011-07-30: qty 5

## 2011-07-30 MED ORDER — HYDROCODONE-ACETAMINOPHEN 7.5-500 MG/15ML PO SOLN: 15.0000 mL | Freq: Four times a day (QID) | ORAL | Status: AC | PRN

## 2011-07-30 MED ORDER — IOHEXOL 350 MG/ML SOLN
100.0000 mL | Freq: Once | INTRAVENOUS | Status: AC | PRN
  Administered 2011-07-30: 100 mL via INTRAVENOUS

## 2011-07-30 NOTE — ED Notes (Signed)
MD at bedside. 

## 2011-07-30 NOTE — ED Provider Notes (Signed)
History     CSN: [id number]  Arrival date & time 07/29/11  2335   First MD Initiated Contact with Patient 07/30/11 0033      Chief Complaint  Patient presents with  . Back Pain  . Abdominal Pain    (Consider location/radiation/quality/duration/timing/severity/associated sxs/prior treatment) The history is provided by the patient.  Left-sided upper back pain that radiates to left flank. She has a history of lung cancer and is status post radiation treatment to that area in her left lung. Pain is sharp and stabbing. She has active cancer without recent chemotherapy. She denies any difficulty breathing. No change in dry cough. No productive sputum. No hemoptysis. She takes oxycodone at home for cancer-related pains but denies having pain this severe in the past. No double pain. No nausea vomiting or diarrhea. No leg pain or swelling. No history of blood clots.no fall or trauma. Pain persistent and worsening over the last few days.no known history of heart disease.moderate in severity.  Past Medical History  Diagnosis Date  . Hypertension   . COPD (chronic obstructive pulmonary disease)   . Stroke   . Abdominal aortic aneurysm   . Dyslipidemia   . S/P radiation therapy 7/12 thru 9/12, 04/18/11    xrt to brain mets  . Lung cancer 12/2010  . Brain cancer     mets from lung primary    Past Surgical History  Procedure Date  . Carpal tunnel release   . Left knee surgery   . Cholecystectomy   . Partial hysterectomy     Family History  Problem Relation Age of Onset  . Cancer Father     lung, leukemia  . Cancer Maternal Aunt     kidney    History  Substance Use Topics  . Smoking status: Current Everyday Smoker -- 1.0 packs/day for 47 years    Types: Cigarettes  . Smokeless tobacco: Not on file  . Alcohol Use: Yes     occassional    OB History    Grav Para Term Preterm Abortions TAB SAB Ect Mult Living                  Review of Systems  Constitutional: Negative for  fever and chills.  HENT: Negative for neck pain and neck stiffness.   Eyes: Negative for pain.  Respiratory: Negative for shortness of breath.   Cardiovascular: Positive for chest pain. Negative for palpitations and leg swelling.  Gastrointestinal: Negative for nausea, vomiting and abdominal pain.  Genitourinary: Negative for dysuria.  Musculoskeletal: Positive for back pain.  Skin: Negative for rash.  Neurological: Negative for headaches.  All other systems reviewed and are negative.    Allergies  Carboplatin; Codeine; Penicillins; and Sulfa antibiotics  Home Medications   Current Outpatient Rx  Name Route Sig Dispense Refill  . ACETAMINOPHEN 325 MG PO TABS Oral Take 650 mg by mouth every 6 (six) hours as needed. For pain    . AMITRIPTYLINE HCL 100 MG PO TABS Oral Take 200 mg by mouth at bedtime.      . BUDESONIDE-FORMOTEROL FUMARATE 80-4.5 MCG/ACT IN AERO Inhalation Inhale 2 puffs into the lungs 2 (two) times daily.      . CYCLOBENZAPRINE HCL 10 MG PO TABS Oral Take 10 mg by mouth at bedtime.    Marland Kitchen DEXAMETHASONE 4 MG PO TABS Oral Take 4 mg by mouth as directed. 1 tab BID day before of and after chemo     . DILTIAZEM HCL 120 MG  PO CP24 Oral Take 120 mg by mouth daily.      Marland Kitchen FOLIC ACID 1 MG PO TABS Oral Take 1 mg by mouth daily.      . OXYCODONE-ACETAMINOPHEN 5-325 MG PO TABS Oral Take 1 tablet by mouth every 6 (six) hours as needed. For pain    . PROCHLORPERAZINE MALEATE 10 MG PO TABS Oral Take 10 mg by mouth every 6 (six) hours as needed. For nausea      BP 125/59  Pulse 82  Temp(Src) 98 F (36.7 C) (Oral)  Resp 18  Ht 5\' 2"  (1.575 m)  Wt 183 lb (83.008 kg)  BMI 33.47 kg/m2  SpO2 93%  Physical Exam  Constitutional: She is oriented to person, place, and time. She appears well-developed and well-nourished.  HENT:  Head: Normocephalic and atraumatic.  Eyes: Conjunctivae and EOM are normal. Pupils are equal, round, and reactive to light.  Neck: Trachea normal. Neck  supple. No thyromegaly present.  Cardiovascular: Normal rate, regular rhythm, S1 normal, S2 normal and normal pulses.     No systolic murmur is present   No diastolic murmur is present  Pulses:      Radial pulses are 2+ on the right side, and 2+ on the left side.  Pulmonary/Chest: Effort normal and breath sounds normal. She has no wheezes. She has no rhonchi. She has no rales.       Localizes discomfort left sided midthoracic posterior. No erythema or rash. No reproducible tenderness. No crepitus.  Abdominal: Soft. Normal appearance and bowel sounds are normal. There is no tenderness. There is no CVA tenderness and negative Murphy's sign.  Musculoskeletal:       BLE:s Calves nontender, no cords or erythema, negative Homans sign  Neurological: She is alert and oriented to person, place, and time. She has normal strength. No cranial nerve deficit or sensory deficit. GCS eye subscore is 4. GCS verbal subscore is 5. GCS motor subscore is 6.  Skin: Skin is warm and dry. No rash noted. She is not diaphoretic.  Psychiatric: Her speech is normal.       Cooperative and appropriate    ED Course  Procedures (including critical care time)  Results for orders placed during the hospital encounter of 07/29/11  CBC      Component Value Range   WBC 6.7  4.0 - 10.5 (K/uL)   RBC 3.80 (*) 3.87 - 5.11 (MIL/uL)   Hemoglobin 12.1  12.0 - 15.0 (g/dL)   HCT 16.1  09.6 - 04.5 (%)   MCV 96.8  78.0 - 100.0 (fL)   MCH 31.8  26.0 - 34.0 (pg)   MCHC 32.9  30.0 - 36.0 (g/dL)   RDW 40.9  81.1 - 91.4 (%)   Platelets 183  150 - 400 (K/uL)  COMPREHENSIVE METABOLIC PANEL      Component Value Range   Sodium 142  135 - 145 (mEq/L)   Potassium 3.9  3.5 - 5.1 (mEq/L)   Chloride 106  96 - 112 (mEq/L)   CO2 28  19 - 32 (mEq/L)   Glucose, Bld 146 (*) 70 - 99 (mg/dL)   BUN 20  6 - 23 (mg/dL)   Creatinine, Ser 7.82  0.50 - 1.10 (mg/dL)   Calcium 9.6  8.4 - 95.6 (mg/dL)   Total Protein 6.5  6.0 - 8.3 (g/dL)   Albumin  3.1 (*) 3.5 - 5.2 (g/dL)   AST 13  0 - 37 (U/L)   ALT 12  0 -  35 (U/L)   Alkaline Phosphatase 103  39 - 117 (U/L)   Total Bilirubin 0.1 (*) 0.3 - 1.2 (mg/dL)   GFR calc non Af Amer 54 (*) >90 (mL/min)   GFR calc Af Amer 63 (*) >90 (mL/min)  LIPASE, BLOOD      Component Value Range   Lipase 68 (*) 11 - 59 (U/L)   Dg Chest 2 View  07/02/2011  *RADIOLOGY REPORT*  Clinical Data: Upper back pain.  History of smoker and COPD.  Left- sided lung cancer.  CHEST - 2 VIEW  Comparison: 12/24/2010.  Chest CT 06/10/2011.  Findings: Compared to the recent prior chest CT, no interval change in the size of pulmonary hilum.  Compared to the prior chest radiograph, there has been a decrease in size of the left hilar mass.  Right IJ power port is present.  The appearance of the left lung is unchanged.  There is patchy density at the left lung base.  If the patient has undergone radiation therapy, this could represent post radiation changes however pneumonia is not excluded.  If the patient is immunosuppressed, consider empiric treatment for pneumonia.  IMPRESSION: 1.  Unchanged right IJ power port. 2.  Bilateral hilar fullness with stable size of the left hilum in this patient with known lung cancer. 3.  Left basilar patchy density which could represent post radiation changes.  Pneumonia is a definite consideration, particularly with immunosuppression is present.  Original Report Authenticated By: Andreas Newport, M.D.   Ct Angio Chest W/cm &/or Wo Cm  07/30/2011  *RADIOLOGY REPORT*  Clinical Data: Chest pain  CT ANGIOGRAPHY CHEST  Technique:  Multidetector CT imaging of the chest using the standard protocol during bolus administration of intravenous contrast. Multiplanar reconstructed images including MIPs were obtained and reviewed to evaluate the vascular anatomy.  Contrast:  Comparison: 06/10/2011  Findings: Right chest wall Port-A-Cath, with catheter tip projecting over the distal right atrium/IVC junction.  Due to  limitations in injection rate capabilities for this port, contrast bolus timing to evaluate for pulmonary embolism is suboptimal. Within this limitation, no main or lobar branch pulmonary arterial filling defect is identified.  Tiny focus of gas within the main pulmonary artery is likely iatrogenic and incidental.  Aorta is of normal caliber.  Atherosclerotic disease noted at the brachiocephalic origin.  Scattered atherosclerotic disease of the aorta and branch vessels.  Centrolobular emphysematous changes.  Radiation changes involving the left lung. Left hilar mass and soft tissue density encasing lower lobe bronchi is similar to prior, measuring up to 19 x 10 mm.  15 x 11 mm nodule within the right lower lobe, previously measured 15 x 11 mm.  The density of this nodule has increased in the interval. 4 mm right upper lobe nodule posteriorly is unchanged (image 12 series 7).  No pneumothorax.  No pleural effusion.  Right hilar lymph nodes are noted, the largest of which measures 15 mm short axis, which has increased from 11 mm previously.  Coronary artery calcification.  Aortic valve calcification.  Trace pericardial fluid versus thickening.  Limited images through the upper abdomen demonstrate per surgical changes along the gallbladder fossa.  Lobular splenic contour with peripheral calcification along the posterior margin.  Mild rightward curvature of the thoracic spine.  No acute osseous abnormality is identified.  IMPRESSION: No main or lobar branch pulmonary arterial filling defect. Segmental and subsegmental branches suboptimally evaluated.  Centrolobular emphysematous changes.  Radiation changes involving the left lung.  The left hilar mass is  without significant interval change.  Increased size of right hilar lymph nodes.  The right lower lobe nodule measures a similar size however demonstrates increased density in the interval.  Original Report Authenticated By: Waneta Martins, M.D.      Date:  07/30/2011  Rate: 90  Rhythm: normal sinus rhythm  QRS Axis: normal  Intervals: normal  ST/T Wave abnormalities: nonspecific ST changes  Conduction Disutrbances:none  Narrative Interpretation:   Old EKG Reviewed: none available  IV Dilaudid. Labs. EKG. CT chest to evaluate for PE.   MDM  Left back pain with history of lung cancer and status post recent radiation treatment. Evaluated as above with labs and CT scan of chest. No ST changes on EKG. Mildly elevated lipase but doubt keep pancreatitis based on clinical presentation. Given radiation changes involving left lung, this is likely etiology for symptoms.good pain control achieved in the emergency department. Plan PCP follow up and pain medications at home. Strict return precautions verbalized as understood.        Sunnie Nielsen, MD 07/30/11 [phone]

## 2011-07-30 NOTE — ED Notes (Signed)
Patient transported to CT 

## 2011-08-04 ENCOUNTER — Ambulatory Visit
Admission: RE | Admit: 2011-08-04 | Discharge: 2011-08-04 | Disposition: A | Discharge status: Home / Self Care | Payer: Medicare Other | Source: Ambulatory Visit | Attending: Neurosurgery | Admitting: Neurosurgery

## 2011-08-04 DIAGNOSIS — M549 Dorsalgia, unspecified: Secondary | ICD-10-CM

## 2011-08-04 MED ORDER — GADOBENATE DIMEGLUMINE 529 MG/ML IV SOLN
17.0000 mL | Freq: Once | INTRAVENOUS | Status: AC | PRN
  Administered 2011-08-04: 17 mL via INTRAVENOUS

## 2011-08-09 ENCOUNTER — Encounter (HOSPITAL_COMMUNITY): Payer: Self-pay | Admitting: *Deleted

## 2011-08-09 ENCOUNTER — Emergency Department (HOSPITAL_COMMUNITY)
Admission: EM | Admit: 2011-08-09 | Discharge: 2011-08-09 | Disposition: A | Discharge status: Home / Self Care | Payer: Medicare Other | Attending: Emergency Medicine | Admitting: Emergency Medicine

## 2011-08-09 DIAGNOSIS — Z79899 Other long term (current) drug therapy: Secondary | ICD-10-CM | POA: Insufficient documentation

## 2011-08-09 DIAGNOSIS — J4489 Other specified chronic obstructive pulmonary disease: Secondary | ICD-10-CM | POA: Insufficient documentation

## 2011-08-09 DIAGNOSIS — Z85118 Personal history of other malignant neoplasm of bronchus and lung: Secondary | ICD-10-CM | POA: Insufficient documentation

## 2011-08-09 DIAGNOSIS — R059 Cough, unspecified: Secondary | ICD-10-CM | POA: Insufficient documentation

## 2011-08-09 DIAGNOSIS — F17200 Nicotine dependence, unspecified, uncomplicated: Secondary | ICD-10-CM | POA: Insufficient documentation

## 2011-08-09 DIAGNOSIS — R05 Cough: Secondary | ICD-10-CM | POA: Insufficient documentation

## 2011-08-09 DIAGNOSIS — M549 Dorsalgia, unspecified: Secondary | ICD-10-CM

## 2011-08-09 DIAGNOSIS — I10 Essential (primary) hypertension: Secondary | ICD-10-CM | POA: Insufficient documentation

## 2011-08-09 DIAGNOSIS — M546 Pain in thoracic spine: Secondary | ICD-10-CM | POA: Insufficient documentation

## 2011-08-09 DIAGNOSIS — R079 Chest pain, unspecified: Secondary | ICD-10-CM | POA: Insufficient documentation

## 2011-08-09 DIAGNOSIS — Z8673 Personal history of transient ischemic attack (TIA), and cerebral infarction without residual deficits: Secondary | ICD-10-CM | POA: Insufficient documentation

## 2011-08-09 DIAGNOSIS — J449 Chronic obstructive pulmonary disease, unspecified: Secondary | ICD-10-CM | POA: Insufficient documentation

## 2011-08-09 DIAGNOSIS — E785 Hyperlipidemia, unspecified: Secondary | ICD-10-CM | POA: Insufficient documentation

## 2011-08-09 DIAGNOSIS — Z85841 Personal history of malignant neoplasm of brain: Secondary | ICD-10-CM | POA: Insufficient documentation

## 2011-08-09 MED ORDER — PREDNISONE 50 MG PO TABS: 50.0000 mg | ORAL_TABLET | Freq: Every day | ORAL | Status: DC

## 2011-08-09 MED ORDER — OXYCODONE-ACETAMINOPHEN 5-325 MG PO TABS: 1.0000 | ORAL_TABLET | ORAL | Status: AC | PRN

## 2011-08-09 MED ORDER — CYCLOBENZAPRINE HCL 10 MG PO TABS: 10.0000 mg | ORAL_TABLET | Freq: Two times a day (BID) | ORAL | Status: AC | PRN

## 2011-08-09 NOTE — Discharge Instructions (Signed)
I suspect your pain is coming from a combination of several things including an injured muscles in your side. Continue your regular medications. Take Percocet as prescribed as needed for severe pain. Take flexeril as prescribed as needed for spasms. Do not drive if taking. Follow up with your primary care doctor as soon as able for recheck and further evaluation.

## 2011-08-09 NOTE — ED Notes (Signed)
Pt in c/o mid back pain x4 weeks, denies injury, states she has been evaluated for same twice and recently had a MRI completed, has not received results, states this am she got out of bed and heard a pop in her back, increased pain since that time

## 2011-08-09 NOTE — ED Provider Notes (Signed)
History     CSN: [id number]  Arrival date & time 08/09/11  1128   First MD Initiated Contact with Patient 08/09/11 1156      Chief Complaint  Patient presents with  . Back Pain    (Consider location/radiation/quality/duration/timing/severity/associated sxs/prior treatment) Patient is a 61 y.o. female presenting with back pain. The history is provided by the patient.  Back Pain  This is a chronic problem. The current episode started more than 1 week ago. The problem occurs constantly. The problem has not changed since onset.The pain is associated with no known injury. The pain is present in the thoracic spine. The quality of the pain is described as shooting. Radiates to: left lower ribs. The pain is at a severity of 8/10. The pain is moderate. The symptoms are aggravated by bending and twisting (coughing). Associated symptoms include chest pain. Pertinent negatives include no fever and no abdominal pain. She has tried nothing for the symptoms. The treatment provided no relief.  pt states she has seen her oncologist, and a back specialist, and has been seen here for the same pain over last 4 weeks. She has had CT angio of chest which did not show any significant findings. She has also had MRIs of lumbar and thoracic spine, results of which she does not know. On review of the chart, pt's MRIs did not show significant disk/spine disease, no metastasis. Pt states her pain continues, worse with movement and coughing. Denies SOb, denies fever, chills, rash, urinary symptoms, abdominal pain, numbness or weakness of extremities. Ran out of pain medications.   Past Medical History  Diagnosis Date  . Hypertension   . COPD (chronic obstructive pulmonary disease)   . Stroke   . Abdominal aortic aneurysm   . Dyslipidemia   . S/P radiation therapy 7/12 thru 9/12, 04/18/11    xrt to brain mets  . Lung cancer 12/2010  . Brain cancer     mets from lung primary    Past Surgical History  Procedure  Date  . Carpal tunnel release   . Left knee surgery   . Cholecystectomy   . Partial hysterectomy     Family History  Problem Relation Age of Onset  . Cancer Father     lung, leukemia  . Cancer Maternal Aunt     kidney    History  Substance Use Topics  . Smoking status: Current Everyday Smoker -- 1.0 packs/day for 47 years    Types: Cigarettes  . Smokeless tobacco: Not on file  . Alcohol Use: Yes     occassional    OB History    Grav Para Term Preterm Abortions TAB SAB Ect Mult Living                  Review of Systems  Constitutional: Negative for fever and chills.  HENT: Negative.   Eyes: Negative.   Respiratory: Positive for cough. Negative for shortness of breath.   Cardiovascular: Positive for chest pain.  Gastrointestinal: Negative.  Negative for abdominal pain.  Genitourinary: Negative.   Musculoskeletal: Positive for back pain.  Neurological: Negative.   Psychiatric/Behavioral: Negative.     Allergies  Carboplatin; Codeine; Penicillins; and Sulfa antibiotics  Home Medications   Current Outpatient Rx  Name Route Sig Dispense Refill  . ACETAMINOPHEN 325 MG PO TABS Oral Take 650 mg by mouth every 6 (six) hours as needed. For pain    . AMITRIPTYLINE HCL 100 MG PO TABS Oral Take 200 mg by mouth  at bedtime.      . BUDESONIDE-FORMOTEROL FUMARATE 80-4.5 MCG/ACT IN AERO Inhalation Inhale 2 puffs into the lungs 2 (two) times daily.      . CYCLOBENZAPRINE HCL 10 MG PO TABS Oral Take 10 mg by mouth at bedtime.    Marland Kitchen DEXAMETHASONE 4 MG PO TABS Oral Take 4 mg by mouth as directed. 1 tab BID day before of and after chemo     . DILTIAZEM HCL 120 MG PO CP24 Oral Take 120 mg by mouth daily.      Marland Kitchen FOLIC ACID 1 MG PO TABS Oral Take 1 mg by mouth daily.      Marland Kitchen HYDROCODONE-ACETAMINOPHEN 7.5-500 MG/15ML PO SOLN Oral Take 15 mLs by mouth every 6 (six) hours as needed for pain. 60 mL 0  . OXYCODONE-ACETAMINOPHEN 5-325 MG PO TABS Oral Take 1 tablet by mouth every 6 (six) hours  as needed. For pain    . PROCHLORPERAZINE MALEATE 10 MG PO TABS Oral Take 10 mg by mouth every 6 (six) hours as needed. For nausea      BP 129/78  Pulse 85  Temp(Src) 98.5 F (36.9 C) (Oral)  Resp 20  SpO2 95%  Physical Exam  Nursing note and vitals reviewed. Constitutional: She is oriented to person, place, and time. She appears well-developed and well-nourished. No distress.  HENT:  Head: Normocephalic and atraumatic.  Eyes: Conjunctivae are normal.  Neck: Neck supple.  Cardiovascular: Normal rate, regular rhythm and normal heart sounds.   Pulmonary/Chest: Effort normal and breath sounds normal. No respiratory distress. She has no wheezes. She has no rales.       Left thoracic paraspinal tenderness extending over lower ribs into midaxillary region. No bruising, swelling, rash. Normal chest wall movement.  Abdominal: Soft. Bowel sounds are normal.  Neurological: She is alert and oriented to person, place, and time.       Normal upper and lower extremity strength, normal gait  Skin: Skin is warm and dry.  Psychiatric: She has a normal mood and affect.    ED Course  Procedures (including critical care time)  Pt with persistnet left back/rib pain. Has had CT angi, MRI thoracic spine with no findings. Pain reproduced with palpation, coughing. Lungs clear on exam today. Suspect muscular pain. Will d/c home with pain medications and follow up. No diagnosis found.    MDM          Lottie Mussel, PA 08/09/11 1231

## 2011-08-10 NOTE — ED Provider Notes (Signed)
Medical screening examination/treatment/procedure(s) were performed by non-physician practitioner and as supervising physician I was immediately available for consultation/collaboration. Tashiana Lamarca Y.   Gavin Pound. Lonnetta Kniskern, MD 08/10/11 1057

## 2011-08-20 ENCOUNTER — Other Ambulatory Visit: Payer: Self-pay | Admitting: Internal Medicine

## 2011-08-20 DIAGNOSIS — C3490 Malignant neoplasm of unspecified part of unspecified bronchus or lung: Secondary | ICD-10-CM

## 2011-08-20 DIAGNOSIS — C719 Malignant neoplasm of brain, unspecified: Secondary | ICD-10-CM

## 2011-09-06 ENCOUNTER — Encounter (HOSPITAL_COMMUNITY): Payer: Self-pay

## 2011-09-06 ENCOUNTER — Emergency Department (HOSPITAL_COMMUNITY)
Admission: EM | Admit: 2011-09-06 | Discharge: 2011-09-07 | Disposition: A | Discharge status: Home / Self Care | Payer: Medicare Other | Attending: Emergency Medicine | Admitting: Emergency Medicine

## 2011-09-06 ENCOUNTER — Emergency Department (HOSPITAL_COMMUNITY): Payer: Medicare Other

## 2011-09-06 DIAGNOSIS — Z85118 Personal history of other malignant neoplasm of bronchus and lung: Secondary | ICD-10-CM | POA: Insufficient documentation

## 2011-09-06 DIAGNOSIS — R079 Chest pain, unspecified: Secondary | ICD-10-CM | POA: Insufficient documentation

## 2011-09-06 DIAGNOSIS — M549 Dorsalgia, unspecified: Secondary | ICD-10-CM | POA: Insufficient documentation

## 2011-09-06 DIAGNOSIS — R0781 Pleurodynia: Secondary | ICD-10-CM

## 2011-09-06 DIAGNOSIS — R109 Unspecified abdominal pain: Secondary | ICD-10-CM | POA: Insufficient documentation

## 2011-09-06 DIAGNOSIS — R10812 Left upper quadrant abdominal tenderness: Secondary | ICD-10-CM | POA: Insufficient documentation

## 2011-09-06 LAB — CBC
MCHC: 34.4 g/dL (ref 30.0–36.0)
Platelets: 200 10*3/uL (ref 150–400)
RDW: 11.8 % (ref 11.5–15.5)
WBC: 8.4 10*3/uL (ref 4.0–10.5)

## 2011-09-06 LAB — BASIC METABOLIC PANEL
Chloride: 102 mEq/L (ref 96–112)
Creatinine, Ser: 1.15 mg/dL — ABNORMAL HIGH (ref 0.50–1.10)
GFR calc Af Amer: 59 mL/min — ABNORMAL LOW (ref >90)
GFR calc non Af Amer: 51 mL/min — ABNORMAL LOW (ref >90)
Potassium: 4.3 mEq/L (ref 3.5–5.1)

## 2011-09-06 MED ORDER — HYDROMORPHONE HCL PF 1 MG/ML IJ SOLN
1.0000 mg | Freq: Once | INTRAMUSCULAR | Status: AC
  Administered 2011-09-06: 1 mg via INTRAVENOUS
  Filled 2011-09-06: qty 1

## 2011-09-06 NOTE — ED Notes (Signed)
Pt presents with low back pain and pain to under left side of ribs x 2 months.

## 2011-09-06 NOTE — ED Provider Notes (Signed)
History   Scribed for Gerhard Munch, MD, the patient was seen in [facility]. The chart was scribed by Gilman Schmidt. The patients care was started at 9:30 PM.   CSN: [id number]  Arrival date & time 09/06/11  1928   First MD Initiated Contact with Patient 09/06/11 2050      Chief Complaint  Patient presents with  . Back Pain  . Abdominal Pain    (Consider location/radiation/quality/duration/timing/severity/associated sxs/prior treatment) HPI Courtney West is a 61 y.o. female with a history of multiple illnesses including lung cancer and brain cancer who presents to the Emergency Department complaining of back pain. Pt also notes numbness under left side of ribs onset 2 months. Last session of chemo therapy was Jan 2013. Pt notes recent MRI. Also notes pain with coughing. Denies any diarrhea, vomiting, SOB or rash. Pt has follow up for 9th of April. There are no other associated symptoms and no other alleviating or aggravating factors.      Past Medical History  Diagnosis Date  . Hypertension   . COPD (chronic obstructive pulmonary disease)   . Stroke   . Abdominal aortic aneurysm   . Dyslipidemia   . S/P radiation therapy 7/12 thru 9/12, 04/18/11    xrt to brain mets  . Lung cancer 12/2010  . Brain cancer     mets from lung primary    Past Surgical History  Procedure Date  . Carpal tunnel release   . Left knee surgery   . Cholecystectomy   . Partial hysterectomy     Family History  Problem Relation Age of Onset  . Cancer Father     lung, leukemia  . Cancer Maternal Aunt     kidney    History  Substance Use Topics  . Smoking status: Current Everyday Smoker -- 1.0 packs/day for 47 years    Types: Cigarettes  . Smokeless tobacco: Not on file  . Alcohol Use: Yes     occassional    OB History    Grav Para Term Preterm Abortions TAB SAB Ect Mult Living                  Review of Systems  Respiratory: Positive for cough. Negative for shortness of breath.     Gastrointestinal: Negative for vomiting and diarrhea.  Musculoskeletal: Positive for back pain.  Skin: Negative for rash.  All other systems reviewed and are negative.    Allergies  Carboplatin; Codeine; Penicillins; and Sulfa antibiotics  Home Medications   Current Outpatient Rx  Name Route Sig Dispense Refill  . BUDESONIDE-FORMOTEROL FUMARATE 80-4.5 MCG/ACT IN AERO Inhalation Inhale 2 puffs into the lungs 2 (two) times daily.      . ACETAMINOPHEN 325 MG PO TABS Oral Take 650 mg by mouth every 6 (six) hours as needed. For pain    . AMITRIPTYLINE HCL 100 MG PO TABS Oral Take 200 mg by mouth at bedtime.      . CYCLOBENZAPRINE HCL 10 MG PO TABS Oral Take 10 mg by mouth at bedtime.    Marland Kitchen DEXAMETHASONE 4 MG PO TABS Oral Take 4 mg by mouth as directed. 1 tab BID day before of and after chemo     . DILTIAZEM HCL 120 MG PO CP24 Oral Take 120 mg by mouth daily.      Marland Kitchen FOLIC ACID 1 MG PO TABS  TAKE ONE TABLET BY MOUTH EVERY DAY 30 tablet 2  . OXYCODONE-ACETAMINOPHEN 5-325 MG PO TABS Oral Take 1  tablet by mouth every 6 (six) hours as needed. For pain    . PREDNISONE 50 MG PO TABS Oral Take 1 tablet (50 mg total) by mouth daily. 5 tablet 0  . PROCHLORPERAZINE MALEATE 10 MG PO TABS Oral Take 10 mg by mouth every 6 (six) hours as needed. For nausea      BP 122/74  Pulse 88  Temp(Src) 98.8 F (37.1 C) (Oral)  Resp 18  Ht 5' 3.5" (1.613 m)  Wt 184 lb (83.462 kg)  BMI 32.08 kg/m2  SpO2 96%  Physical Exam  Constitutional: She appears well-developed and well-nourished.  HENT:  Head: Normocephalic and atraumatic.  Eyes: Conjunctivae are normal. Pupils are equal, round, and reactive to light.  Neck: Neck supple. No tracheal deviation present. No thyromegaly present.  Cardiovascular: Normal rate and regular rhythm.   No murmur heard. Pulmonary/Chest: Effort normal and breath sounds normal.  Abdominal: Soft. Bowel sounds are normal. She exhibits no distension. There is tenderness in the left  upper quadrant.  Musculoskeletal: Normal range of motion. She exhibits no edema and no tenderness.  Neurological: She is alert. Coordination normal.  Skin: Skin is warm and dry. No rash noted.  Psychiatric: She has a normal mood and affect.    ED Course  Procedures (including critical care time)  Labs Reviewed - No data to display No results found.   No diagnosis found.  DIAGNOSTIC STUDIES: Oxygen Saturation is 96% on room air, normal by my interpretation.    LABS Results for orders placed during the hospital encounter of 09/06/11  CBC      Component Value Range   WBC 8.4  4.0 - 10.5 (K/uL)   RBC 4.33  3.87 - 5.11 (MIL/uL)   Hemoglobin 14.3  12.0 - 15.0 (g/dL)   HCT 16.1  09.6 - 04.5 (%)   MCV 96.1  78.0 - 100.0 (fL)   MCH 33.0  26.0 - 34.0 (pg)   MCHC 34.4  30.0 - 36.0 (g/dL)   RDW 40.9  81.1 - 91.4 (%)   Platelets 200  150 - 400 (K/uL)  BASIC METABOLIC PANEL      Component Value Range   Sodium 138  135 - 145 (mEq/L)   Potassium 4.3  3.5 - 5.1 (mEq/L)   Chloride 102  96 - 112 (mEq/L)   CO2 29  19 - 32 (mEq/L)   Glucose, Bld 144 (*) 70 - 99 (mg/dL)   BUN 22  6 - 23 (mg/dL)   Creatinine, Ser 7.82 (*) 0.50 - 1.10 (mg/dL)   Calcium 9.6  8.4 - 95.6 (mg/dL)   GFR calc non Af Amer 51 (*) >90 (mL/min)   GFR calc Af Amer 59 (*) >90 (mL/min)     COORDINATION OF CARE: 9:30pm:  - Patient evaluated by ED physician, Dilaudid, DG Ribs, BMP, CBC ordered   Chest x-ray reviewed by me  MDM  I personally performed the services described in this documentation, which was scribed in my presence. The recorded information has been reviewed and considered.   This 61 year old female with notable history of metastatic lung cancer, now presents with ongoing left infracostal pain.  On exam the patient is in no distress, though she has some discomfort.  The patient's x-rays notable for scarring consistent with radiation effects.  The patient has been narcotic dependent, and notes that she  is out of her medication.  She also has an established referral to pain clinic.  She was made aware of all results, the  necessity to follow up as previously scheduled, and provided additional narcotics prior to discharge.   Gerhard Munch, MD 09/07/11 [phone]

## 2011-09-07 MED ORDER — HYDROCODONE-ACETAMINOPHEN 10-325 MG PO TABS: 1.0000 | ORAL_TABLET | Freq: Three times a day (TID) | ORAL | Status: AC | PRN

## 2011-09-07 NOTE — Discharge Instructions (Signed)
It is extremely important that you followup with your primary care physician, and follow through on the recommendation to consult with the pain management specialists.  You have been prescribed at a higher dose of pain medication.  Please use the medication only as directed.  If you develop any new, or concerning changes in your condition, please return to the emergency department immediately for additional evaluation.

## 2011-09-23 ENCOUNTER — Ambulatory Visit (HOSPITAL_COMMUNITY)
Admission: RE | Admit: 2011-09-23 | Discharge: 2011-09-23 | Disposition: A | Discharge status: Home / Self Care | Payer: Medicare Other | Source: Ambulatory Visit | Attending: Internal Medicine | Admitting: Internal Medicine

## 2011-09-23 ENCOUNTER — Other Ambulatory Visit (HOSPITAL_BASED_OUTPATIENT_CLINIC_OR_DEPARTMENT_OTHER): Payer: Medicare Other

## 2011-09-23 DIAGNOSIS — S2239XA Fracture of one rib, unspecified side, initial encounter for closed fracture: Secondary | ICD-10-CM | POA: Insufficient documentation

## 2011-09-23 DIAGNOSIS — C7949 Secondary malignant neoplasm of other parts of nervous system: Secondary | ICD-10-CM

## 2011-09-23 DIAGNOSIS — C7931 Secondary malignant neoplasm of brain: Secondary | ICD-10-CM

## 2011-09-23 DIAGNOSIS — C3490 Malignant neoplasm of unspecified part of unspecified bronchus or lung: Secondary | ICD-10-CM

## 2011-09-23 DIAGNOSIS — X58XXXA Exposure to other specified factors, initial encounter: Secondary | ICD-10-CM | POA: Insufficient documentation

## 2011-09-23 DIAGNOSIS — R911 Solitary pulmonary nodule: Secondary | ICD-10-CM | POA: Insufficient documentation

## 2011-09-23 DIAGNOSIS — J438 Other emphysema: Secondary | ICD-10-CM | POA: Insufficient documentation

## 2011-09-23 DIAGNOSIS — I714 Abdominal aortic aneurysm, without rupture, unspecified: Secondary | ICD-10-CM | POA: Insufficient documentation

## 2011-09-23 DIAGNOSIS — Z9089 Acquired absence of other organs: Secondary | ICD-10-CM | POA: Insufficient documentation

## 2011-09-23 DIAGNOSIS — C801 Malignant (primary) neoplasm, unspecified: Secondary | ICD-10-CM

## 2011-09-23 LAB — CMP (CANCER CENTER ONLY)
ALT(SGPT): 22 U/L (ref 10–47)
AST: 19 U/L (ref 11–38)
Albumin: 3.1 g/dL — ABNORMAL LOW (ref 3.3–5.5)
Alkaline Phosphatase: 105 U/L — ABNORMAL HIGH (ref 26–84)
BUN, Bld: 15 mg/dL (ref 7–22)
Calcium: 9.1 mg/dL (ref 8.0–10.3)
Chloride: 104 mEq/L (ref 98–108)
Potassium: 4.6 mEq/L (ref 3.3–4.7)
Sodium: 144 mEq/L (ref 128–145)
Total Protein: 7.3 g/dL (ref 6.4–8.1)

## 2011-09-23 LAB — CBC WITH DIFFERENTIAL/PLATELET
BASO%: 1.2 % (ref 0.0–2.0)
EOS%: 1.2 % (ref 0.0–7.0)
HCT: 46.1 % (ref 34.8–46.6)
HGB: 15.5 g/dL (ref 11.6–15.9)
MCH: 32.3 pg (ref 25.1–34.0)
MCHC: 33.7 g/dL (ref 31.5–36.0)
MONO#: 0.8 10*3/uL (ref 0.1–0.9)
NEUT%: 61.7 % (ref 38.4–76.8)
RDW: 12.3 % (ref 11.2–14.5)
WBC: 9.1 10*3/uL (ref 3.9–10.3)
lymph#: 2.4 10*3/uL (ref 0.9–3.3)

## 2011-09-23 MED ORDER — IOHEXOL 300 MG/ML  SOLN
100.0000 mL | Freq: Once | INTRAMUSCULAR | Status: AC | PRN
  Administered 2011-09-23: 100 mL via INTRAVENOUS

## 2011-09-24 ENCOUNTER — Telehealth: Payer: Self-pay | Admitting: *Deleted

## 2011-09-24 NOTE — Telephone Encounter (Signed)
Pt called stating she cannot keep appt for MRI brain on 10/10/11 or her FU appt w/Dr Kathrynn Running on 10/13/11. She states she can only come to appts on Tues or Wed. Informed pt these appts are coordinated w/Dr Cabbell's office and scheduled by Dwana Melena who is out of office until 09/29/11. Informed her will let S Boyles know that pt needs these appts rescheduled. Assured pt S Chester Holstein will be in touch with her next Monday. Pt verbalized understanding.

## 2011-09-29 ENCOUNTER — Other Ambulatory Visit: Payer: Self-pay | Admitting: Radiation Therapy

## 2011-09-29 DIAGNOSIS — C7931 Secondary malignant neoplasm of brain: Secondary | ICD-10-CM

## 2011-09-30 ENCOUNTER — Ambulatory Visit (HOSPITAL_BASED_OUTPATIENT_CLINIC_OR_DEPARTMENT_OTHER): Payer: Medicare Other | Admitting: Internal Medicine

## 2011-09-30 VITALS — BP 118/76 | HR 74 | Temp 98.0°F | Ht 63.5 in | Wt 173.7 lb

## 2011-09-30 DIAGNOSIS — F17200 Nicotine dependence, unspecified, uncomplicated: Secondary | ICD-10-CM

## 2011-09-30 DIAGNOSIS — C7949 Secondary malignant neoplasm of other parts of nervous system: Secondary | ICD-10-CM

## 2011-09-30 DIAGNOSIS — C3430 Malignant neoplasm of lower lobe, unspecified bronchus or lung: Secondary | ICD-10-CM

## 2011-09-30 DIAGNOSIS — C3490 Malignant neoplasm of unspecified part of unspecified bronchus or lung: Secondary | ICD-10-CM

## 2011-09-30 DIAGNOSIS — R634 Abnormal weight loss: Secondary | ICD-10-CM

## 2011-09-30 DIAGNOSIS — C7931 Secondary malignant neoplasm of brain: Secondary | ICD-10-CM

## 2011-09-30 NOTE — Progress Notes (Signed)
[facility] [phone]   Fax:([phone]  OFFICE PROGRESS NOTE  [name], MD, MD [facility] [address] [place] [address]  DIAGNOSIS: Metastatic non-small cell lung cancer, favoring adenocarcinoma diagnosed in July of 2012, presented with locally advanced disease in the chest as well as brain metastasis.   PRIOR THERAPY:  1. Status post stereotactic radiotherapy to 2 brain lesions under the care of Dr. Mitzi Hansen. 2. Status post a course of concurrent chemoradiation with weekly carboplatin and paclitaxel, last dose of chemotherapy was given 03/05/2011. 3. Systemic chemotherapy with carboplatin for AUC of 5 and Alimta 500 mg/M2. The patient is status post 3 cycles.   CURRENT THERAPY: Observation.  INTERVAL HISTORY: Verdine P Krinke 61 y.o. female returns to the clinic today for  3 months followup visit that time by her daughter. The patient has no complaints today. She denied having any significant chest pain or shortness of breath. She continues to have mild cough. Unfortunately she continues to smoke and she is not willing to quit. She addition he lost around 11 pounds since her last visit. The patient is scheduled to have repeat MRI of the brain next week under the care of Dr. Kathrynn Running. She has repeat CT scan of the chest, abdomen and pelvis performed recently and she is here today for evaluation and discussion of her scan results.  MEDICAL HISTORY: Past Medical History  Diagnosis Date  . Hypertension   . COPD (chronic obstructive pulmonary disease)   . Stroke   . Abdominal aortic aneurysm   . Dyslipidemia   . S/P radiation therapy 7/12 thru 9/12, 04/18/11    xrt to brain mets  . Lung cancer 12/2010  . Brain cancer     mets from lung primary    ALLERGIES:  is allergic to carboplatin; codeine; penicillins; and sulfa antibiotics.  MEDICATIONS:  Current Outpatient Prescriptions  Medication Sig Dispense  Refill  . amitriptyline (ELAVIL) 100 MG tablet Take 200 mg by mouth at bedtime.        . budesonide-formoterol (SYMBICORT) 80-4.5 MCG/ACT inhaler Inhale 2 puffs into the lungs 2 (two) times daily.        Marland Kitchen diltiazem (DILACOR XR) 120 MG 24 hr capsule Take 120 mg by mouth daily.        . folic acid (FOLVITE) 1 MG tablet TAKE ONE TABLET BY MOUTH EVERY DAY  30 tablet  2  . HYDROcodone-acetaminophen (NORCO) 5-325 MG per tablet Take 1 tablet by mouth every 6 (six) hours as needed. pain      . prochlorperazine (COMPAZINE) 10 MG tablet Take 10 mg by mouth every 6 (six) hours as needed. For nausea        SURGICAL HISTORY:  Past Surgical History  Procedure Date  . Carpal tunnel release   . Left knee surgery   . Cholecystectomy   . Partial hysterectomy     REVIEW OF SYSTEMS:  A comprehensive review of systems was negative except for: Respiratory: positive for cough   PHYSICAL EXAMINATION: General appearance: alert, cooperative and no distress Head: Normocephalic, without obvious abnormality, atraumatic Neck: no adenopathy Lymph nodes: Cervical, supraclavicular, and axillary nodes normal. Resp: clear to auscultation bilaterally Cardio: regular rate and rhythm, S1, S2 normal, no murmur, click, rub or gallop GI: soft, non-tender; bowel sounds normal; no masses,  no organomegaly Extremities: extremities normal, atraumatic, no cyanosis or edema Neurologic: Alert and oriented X 3, normal strength and tone. Normal  symmetric reflexes. Normal coordination and gait  ECOG PERFORMANCE STATUS: 1 - Symptomatic but completely ambulatory  Blood pressure 118/76, pulse 74, temperature 98 F (36.7 C), temperature source Oral, height 5' 3.5" (1.613 m), weight 173 lb 11.2 oz (78.79 kg).  LABORATORY DATA: Lab Results  Component Value Date   WBC 9.1 09/23/2011   HGB 15.5 09/23/2011   HCT 46.1 09/23/2011   MCV 95.9 09/23/2011   PLT 194 09/23/2011      Chemistry      Component Value Date/Time   NA 144 09/23/2011  1327   NA 138 09/06/2011 2145   K 4.6 09/23/2011 1327   K 4.3 09/06/2011 2145   CL 104 09/23/2011 1327   CL 102 09/06/2011 2145   CO2 30 09/23/2011 1327   CO2 29 09/06/2011 2145   BUN 15 09/23/2011 1327   BUN 22 09/06/2011 2145   CREATININE 0.8 09/23/2011 1327   CREATININE 1.15* 09/06/2011 2145      Component Value Date/Time   CALCIUM 9.1 09/23/2011 1327   CALCIUM 9.6 09/06/2011 2145   ALKPHOS 105* 09/23/2011 1327   ALKPHOS 103 07/30/2011 0053   AST 19 09/23/2011 1327   AST 13 07/30/2011 0053   ALT 12 07/30/2011 0053   BILITOT 0.30 09/23/2011 1327   BILITOT 0.1* 07/30/2011 0053       RADIOGRAPHIC STUDIES:  Ct Chest W Contrast  09/23/2011  *RADIOLOGY REPORT*  Clinical Data:  Restaging lung cancer  CT CHEST, ABDOMEN AND PELVIS WITH CONTRAST  Technique:  Multidetector CT imaging of the chest, abdomen and pelvis was performed following the standard protocol during bolus administration of intravenous contrast.  Contrast:  100 ml of omni 300  Comparison:  Previous staging CT dated 06/10/2011   CT CHEST  Findings:  No enlarged axillary or supraclavicular lymph nodes.  No pericardial or pleural effusion.  The right hilar lymph node measures 1.5 cm, image 29.  Previously 0.8 cm.  Left hilar lymph node measures 1.1 cm, image 31. Previously 0.9 cm.  There is no pericardial or pleural effusion.  Advanced emphysema.  Measures 1.1 cm in transverse diameter.  This measurement is taken from coronal image  85.  On the previous examination the transverse diameter (also taken from the coronal image) measures 0.8 cm.  Tiny nodule in the right upper lobe measures 3.7 mm, image 13. Previously 3.5 mm.  Radiation change within the left lung appears progressive from previous exam.  Subacute left eighth rib fracture is identified, image 37.  No lytic or sclerotic bone lesions identified.  IMPRESSION:  1.  Bilateral hilar lymphoid tissue appears increased in size from previous exam. 2.  Pulmonary nodule within the right lower lobe is slightly  increased in size from previous exam. 3.  Subacute left eighth rib fracture.   CT ABDOMEN AND PELVIS  Findings:  No focal liver abnormalities.  The adrenal glands both appear normal.  Calcifications along the lateral margin of the splenic parenchyma are noted and unchanged from previous exam.  Both adrenal glands are normal.  The pancreas is normal.  Prior cholecystectomy.  No biliary dilatation.  Normal appearance of the kidneys.  Infrarenal abdominal aortic aneurysm measures 4.7 cm, image 70. Unchanged from previous exam.  There is no pelvic or inguinal adenopathy.  The stomach and the small bowel loops appear normal.  The appendix is identified and appears normal.  Normal appearance of the colon.  Review of the visualized osseous structures is significant for compression deformity involving the L2  vertebra.  This is unchanged from previous examination.  No lytic or sclerotic bone lesions identified.  IMPRESSION:  1.  No specific features to suggest mass or adenopathy within the abdomen or pelvis. 2.  Abdominal aortic aneurysm.  Unchanged in caliber from previous exam. 3.  L1 vertebral compression deformity.  Stable from previous study.  Original Report Authenticated By: Rosealee Albee, M.D.     ASSESSMENT: This is a very pleasant 61 years old white female with history of metastatic non-small cell lung cancer with last chemotherapy in the form of carboplatin and Alimta. The patient is doing fine but has some evidence for a slight to disease progression in the bilateral hilar lymph nodes. I personally reviewed the images and did not see a big difference between the previous scan and the current one.  PLAN: I discussed the scan results with the patient and her daughter. I recommended for her continuous observation for now with repeat CT scan of the chest, abdomen and pelvis in 3 months. The patient was advised to call me immediately if she has any concerning symptoms in the interval. I also strongly  encouraged the patient to quit smoking and offered her smoke cessation program but she declined at this point.  All questions were answered. The patient knows to call the clinic with any problems, questions or concerns. We can certainly see the patient much sooner if necessary.

## 2011-10-07 ENCOUNTER — Other Ambulatory Visit: Payer: Medicare Other

## 2011-10-07 ENCOUNTER — Ambulatory Visit
Admission: RE | Admit: 2011-10-07 | Discharge: 2011-10-07 | Disposition: A | Discharge status: Home / Self Care | Payer: Medicare Other | Source: Ambulatory Visit | Attending: Radiation Oncology | Admitting: Radiation Oncology

## 2011-10-07 DIAGNOSIS — C7931 Secondary malignant neoplasm of brain: Secondary | ICD-10-CM

## 2011-10-07 MED ORDER — GADOBENATE DIMEGLUMINE 529 MG/ML IV SOLN
16.0000 mL | Freq: Once | INTRAVENOUS | Status: AC | PRN
  Administered 2011-10-07: 16 mL via INTRAVENOUS

## 2011-10-07 NOTE — Progress Notes (Signed)
Quick Note:  Susan, please set-up SRS ______ 

## 2011-10-08 ENCOUNTER — Ambulatory Visit: Payer: Medicare Other | Admitting: Radiation Oncology

## 2011-10-10 ENCOUNTER — Other Ambulatory Visit: Payer: Medicare Other

## 2011-10-13 ENCOUNTER — Encounter: Payer: Self-pay | Admitting: Radiation Oncology

## 2011-10-13 ENCOUNTER — Ambulatory Visit: Admission: RE | Admit: 2011-10-13 | Payer: Medicare Other | Source: Ambulatory Visit | Admitting: Radiation Oncology

## 2011-10-13 NOTE — Progress Notes (Signed)
  Radiation Oncology         [phone] ________________________________  Name: Courtney West MRN: [id number]  Date: 10/13/2011  DOB: 01-29-1951  Telephone contact:  This patient underwent brain MRI on April 16. Shows a mixed response to therapy. Some of her treated lesions are smaller and some are a bit larger without definitive progression.   She was also noted to have a new 4 mm brain metastasis which is untreated.   I would recommend consideration for stereotactic radiosurgery to her new brain metastasis while continuing to observe the others for now. I attempted to call her today but she was unavailable. I left a message on her voicemail at home and her mobile phone to call me back. She does not know the results of her study yet but I plan to discuss these with her as soon as possible for potential stereotactic radiosurgery treatment later this week.  ________________________________  Artist Pais. Kathrynn Running, M.D.

## 2011-10-15 ENCOUNTER — Encounter: Payer: Self-pay | Admitting: *Deleted

## 2011-10-16 ENCOUNTER — Encounter: Payer: Self-pay | Admitting: Radiation Oncology

## 2011-10-16 ENCOUNTER — Ambulatory Visit
Admission: RE | Admit: 2011-10-16 | Discharge: 2011-10-16 | Disposition: A | Discharge status: Home / Self Care | Payer: Medicare Other | Source: Ambulatory Visit | Attending: Radiation Oncology | Admitting: Radiation Oncology

## 2011-10-16 VITALS — BP 111/70 | HR 73 | Temp 97.2°F | Resp 20 | Wt 169.2 lb

## 2011-10-16 DIAGNOSIS — C719 Malignant neoplasm of brain, unspecified: Secondary | ICD-10-CM | POA: Insufficient documentation

## 2011-10-16 MED ORDER — SODIUM CHLORIDE 0.9 % IJ SOLN
10.0000 mL | Freq: Once | INTRAMUSCULAR | Status: AC
  Administered 2011-10-16: 10 mL via INTRAVENOUS

## 2011-10-16 MED ORDER — HEPARIN SOD (PORK) LOCK FLUSH 100 UNIT/ML IV SOLN
500.0000 [IU] | Freq: Once | INTRAVENOUS | Status: AC
  Administered 2011-10-16: 500 [IU] via INTRAVENOUS

## 2011-10-16 NOTE — Progress Notes (Signed)
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  She has a mixed response to [facility] on recent imaging, and a new brain met.                              ALLERGIES:  is allergic to carboplatin; codeine; penicillins; and sulfa antibiotics.  Meds: Current Outpatient Prescriptions  Medication Sig Dispense Refill  . amitriptyline (ELAVIL) 100 MG tablet Take 200 mg by mouth at bedtime.        . budesonide-formoterol (SYMBICORT) 80-4.5 MCG/ACT inhaler Inhale 2 puffs into the lungs 2 (two) times daily.        Marland Kitchen diltiazem (DILACOR XR) 120 MG 24 hr capsule Take 120 mg by mouth daily.        . folic acid (FOLVITE) 1 MG tablet TAKE ONE TABLET BY MOUTH EVERY DAY  30 tablet  2  . HYDROcodone-acetaminophen (NORCO) 5-325 MG per tablet Take 1 tablet by mouth every 6 (six) hours as needed. pain      . prochlorperazine (COMPAZINE) 10 MG tablet Take 10 mg by mouth every 6 (six) hours as needed. For nausea        Physical Findings: The patient is in no acute distress. Patient is alert and oriented.  vitals were not taken for this visit..  No significant changes.  Lab Findings: Lab Results  Component Value Date   WBC 9.1 09/23/2011   HGB 15.5 09/23/2011   HCT 46.1 09/23/2011   MCV 95.9 09/23/2011   PLT 194 09/23/2011    @LASTCHEM @  Radiographic Findings: Ct Chest W Contrast  09/23/2011  *RADIOLOGY REPORT*  Clinical Data:  Restaging lung cancer  CT CHEST, ABDOMEN AND PELVIS WITH CONTRAST  Technique:  Multidetector CT imaging of the chest, abdomen and pelvis was performed following the standard protocol during bolus administration of intravenous contrast.  Contrast:  100 ml  of omni 300  Comparison:  Previous staging CT dated 06/10/2011  CT CHEST  Findings:  No enlarged axillary or supraclavicular lymph nodes.  No pericardial or pleural effusion.  The right hilar lymph node measures 1.5 cm, image 29.  Previously 0.8 cm.  Left hilar lymph node measures 1.1 cm, image 31. Previously 0.9 cm.  There is no pericardial or pleural effusion.  Advanced emphysema.  Measures 1.1 cm in transverse diameter.  This measurement is taken from coronal image  85.  On the previous examination the transverse diameter (also taken from the coronal image) measures 0.8 cm.  Tiny nodule in the right upper lobe measures 3.7 mm, image 13. Previously 3.5 mm.  Radiation change within the left lung appears progressive from previous exam.  Subacute left eighth rib fracture is identified, image 37.  No lytic or sclerotic bone lesions identified.  IMPRESSION:  1.  Bilateral hilar lymphoid tissue appears increased in size from previous exam. 2.  Pulmonary nodule within the right lower lobe is slightly increased in size from previous exam. 3.  Subacute left eighth rib fracture.  CT ABDOMEN AND PELVIS  Findings:  No focal liver abnormalities.  The adrenal glands  both appear normal.  Calcifications along the lateral margin of the splenic parenchyma are noted and unchanged from previous exam.  Both adrenal glands are normal.  The pancreas is normal.  Prior cholecystectomy.  No biliary dilatation.  Normal appearance of the kidneys.  Infrarenal abdominal aortic aneurysm measures 4.7 cm, image 70. Unchanged from previous exam.  There is no pelvic or inguinal adenopathy.  The stomach and the small bowel loops appear normal.  The appendix is identified and appears normal.  Normal appearance of the colon.  Review of the visualized osseous structures is significant for compression deformity involving the L2 vertebra.  This is unchanged from previous examination.  No lytic or sclerotic bone lesions identified.  IMPRESSION:  1.  No  specific features to suggest mass or adenopathy within the abdomen or pelvis. 2.  Abdominal aortic aneurysm.  Unchanged in caliber from previous exam. 3.  L1 vertebral compression deformity.  Stable from previous study.  Original Report Authenticated By: Courtney West, M.D.   Mr Courtney West Contrast  10/07/2011  *RADIOLOGY REPORT*  Clinical Data: SRS follow-up. Lung cancer.  MRI HEAD WITHOUT AND WITH CONTRAST  Technique:  Multiplanar, multiecho pulse sequences of the brain and surrounding structures were obtained according to standard protocol without and with intravenous contrast  Contrast: 16mL MULTIHANCE GADOBENATE DIMEGLUMINE 529 MG/ML IV SOLN  Comparison: Multiple priors, most recent 07/16/2011.  Findings: Continued improvement left posterior frontoparietal parasagittal metastasis without evidence for residual lesion.  Continued improvement left posterior frontal subcortical white matter lesion.  Continued improvement right anterior frontal subcortical white matter lesion, approximately 1 mm diameter.  Slight interval growth right frontal opercular lesion now measuring 6 mm (image 91 of series 10).  New right posterior frontal precentral cortex metastasis, 4 mm.  Worsening but faintly enhancing 2 mm right superior vermian lesion (image 55 of series 10).  Worsening 4 mm left temporal lobe lesion.  Prior exam 04/11/2011 demonstrated two lesions in this area, although I see only one today.  Continued improvement left cerebellar deep white matter lesion.  Continued improvement right cerebellar dentate nucleus lesion.)  There is mild atrophy.  Mild periventricular white matter signal on FLAIR.  Moderate vasogenic edema surrounds the right frontal opercular and right posterior frontal precentral cortex metastases, increased/new from priors. No acute hemorrhage or acute stroke.  IMPRESSION: Mixed pattern of continued improvement and slight worsening multiple intracranial metastatic deposits as described.   Original Report Authenticated By: Courtney West, M.D.   Ct Abdomen Pelvis W Contrast  09/23/2011  *RADIOLOGY REPORT*  Clinical Data:  Restaging lung cancer  CT CHEST, ABDOMEN AND PELVIS WITH CONTRAST  Technique:  Multidetector CT imaging of the chest, abdomen and pelvis was performed following the standard protocol during bolus administration of intravenous contrast.  Contrast:  100 ml of omni 300  Comparison:  Previous staging CT dated 06/10/2011  CT CHEST  Findings:  No enlarged axillary or supraclavicular lymph nodes.  No pericardial or pleural effusion.  The right hilar lymph node measures 1.5 cm, image 29.  Previously 0.8 cm.  Left hilar lymph node measures 1.1 cm, image 31. Previously 0.9 cm.  There is no pericardial or pleural effusion.  Advanced emphysema.  Measures 1.1 cm in transverse diameter.  This measurement is taken from coronal image  85.  On the previous examination the transverse diameter (also taken from the coronal image) measures 0.8 cm.  Tiny nodule in the right upper lobe measures 3.7 mm, image 13. Previously 3.5 mm.  Radiation change within the left lung appears progressive from previous exam.  Subacute left eighth rib fracture is identified, image 37.  No lytic or sclerotic bone lesions identified.  IMPRESSION:  1.  Bilateral hilar lymphoid tissue appears increased in size from previous exam. 2.  Pulmonary nodule within the right lower lobe is slightly increased in size from previous exam. 3.  Subacute left eighth rib fracture.  CT ABDOMEN AND PELVIS  Findings:  No focal liver abnormalities.  The adrenal glands both appear normal.  Calcifications along the lateral margin of the splenic parenchyma are noted and unchanged from previous exam.  Both adrenal glands are normal.  The pancreas is normal.  Prior cholecystectomy.  No biliary dilatation.  Normal appearance of the kidneys.  Infrarenal abdominal aortic aneurysm measures 4.7 cm, image 70. Unchanged from previous exam.  There is no  pelvic or inguinal adenopathy.  The stomach and the small bowel loops appear normal.  The appendix is identified and appears normal.  Normal appearance of the colon.  Review of the visualized osseous structures is significant for compression deformity involving the L2 vertebra.  This is unchanged from previous examination.  No lytic or sclerotic bone lesions identified.  IMPRESSION:  1.  No specific features to suggest mass or adenopathy within the abdomen or pelvis. 2.  Abdominal aortic aneurysm.  Unchanged in caliber from previous exam. 3.  L1 vertebral compression deformity.  Stable from previous study.  Original Report Authenticated By: Courtney West, M.D.    Impression:  The patient is eligible for SRS to her new frontal met.  At this point, the patient would potentially benefit from radiotherapy. The options include whole brain irradiation versus stereotactic radiosurgery. There are pros and cons associated with each of these potential treatment options. Whole brain radiotherapy would treat the known metastatic deposits and help provide some reduction of risk for future brain metastases. However, whole brain radiotherapy carries potential risks including hair loss, subacute somnolence, and neurocognitive changes including a possible permanent reduction in short-term memory. Whole brain radiotherapy also carries a lower likelihood of tumor control at the treatment sites because of the low-dose used. Stereotactic radiosurgery carries a higher likelihood for local tumor control at the targeted sites with lower associated risk for neurocognitive changes such as memory loss. However, the use of stereotactic radiosurgery in this setting may leave the patient at increased risk for new brain metastases elsewhere in the brain as high as 50-60%. Accordingly, patient to receive stereotactic radiosurgery in this setting should undergo ongoing surveillance imaging with brain MRI more frequently in order to identify  and treat new small brain metastases before they become symptomatic. Stereotactic radiosurgery does carry some different risks, including a risk of radionecrosis.  PLAN: Today, I reviewed the findings and workup thus far with the patient. We discussed the dilemma regarding whole brain radiotherapy versus stereotactic radiosurgery. We discussed the pros and cons of each. We also discussed the logistics and delivery of each. We reviewed the results associated with each of the treatments described above. The patient seems to understand the treatment options and would like to proceed with stereotactic radiosurgery to her new metastatic deposit. She also understands that she has other brain metastases that are slightly larger than previous MRI. However, she is comfortable with monitoring these for now since the subtle enlargement could be related to radiation changes..  I spent 30 minutes minutes face to face with the patient and more than 50% of that time was spent in  counseling and/or coordination of care.   _____________________________________  Artist Pais. Kathrynn Running, M.D.

## 2011-10-16 NOTE — Progress Notes (Signed)
Pt right portacath power, de accessed,flushed with 10cc normal saline and 5cc heparin flush 100units/ml, per protocol.,placed 2x2 and taped over site, still had excellent blood return, pt tolerated well. 10:50 AM

## 2011-10-16 NOTE — Progress Notes (Signed)
Pt here f/u  Brain mets, here for ct sim,  Alert,oriented  X 3, has a power port right subclavian,emla cram over site, pain in her mid back, took norco this am, no thrush,not on decadron, getting a new pain management Md per patient statement Rad tx 04/18/11 brain

## 2011-10-16 NOTE — Progress Notes (Signed)
  Radiation Oncology         [phone] ________________________________  Name: Courtney West MRN: [id number]  Date: 10/16/2011  DOB: 07/09/1950  SIMULATION AND TREATMENT PLANNING NOTE  DIAGNOSIS:  61 yo woman with a new 4 mm right frontal metastasis from NSCLC  NARRATIVE:  The patient was brought to the CT Simulation planning suite.  Identity was confirmed.  All relevant records and images related to the planned course of therapy were reviewed.  The patient freely provided informed written consent to proceed with treatment after reviewing the details related to the planned course of therapy. The consent form was witnessed and verified by the simulation staff. Intravenous access was established for contrast administration. Then, the patient was set-up in a stable reproducible supine position for radiation therapy.  A relocatable thermoplastic stereotactic head frame was fabricated for precise immobilization.  CT images were obtained.  Surface markings were placed.  The CT images were loaded into the planning software and fused with the patient's targeting MRI scan.  Then the target and avoidance structures were contoured.  Treatment planning then occurred.  The radiation prescription was entered and confirmed.  I have requested 3D planning  I have requested a DVH of the following structures: Brain stem, brain, left eye, right I, lenses, optic chiasm, target volumes, uninvolved brain, and normal tissue.    PLAN:  The patient will receive 20 Gy in one fraction.  ________________________________  Artist Pais Kathrynn Running, M.D.

## 2011-10-16 NOTE — Progress Notes (Signed)
Met with patient to discuss billing and insurance because of restrictions on Medicaid.  Gave patient epp and Cancer[employer] applications and she will return on next visit 10/20/11.  Also made referral to CSW, Lauren/Abigail for other assistance. 10/20/11:  Patient qualifies for 100% indigent 10/20/11-10/19/12

## 2011-10-17 ENCOUNTER — Encounter: Payer: Self-pay | Admitting: Radiation Oncology

## 2011-10-20 ENCOUNTER — Encounter: Payer: Self-pay | Admitting: Radiation Oncology

## 2011-10-20 ENCOUNTER — Ambulatory Visit
Admission: RE | Admit: 2011-10-20 | Discharge: 2011-10-20 | Disposition: A | Discharge status: Home / Self Care | Payer: Medicare Other | Source: Ambulatory Visit | Attending: Radiation Oncology | Admitting: Radiation Oncology

## 2011-10-20 VITALS — BP 124/85 | HR 74 | Temp 98.5°F | Resp 20

## 2011-10-20 DIAGNOSIS — IMO0002 Reserved for concepts with insufficient information to code with codable children: Secondary | ICD-10-CM

## 2011-10-20 DIAGNOSIS — C719 Malignant neoplasm of brain, unspecified: Secondary | ICD-10-CM

## 2011-10-20 DIAGNOSIS — IMO0001 Reserved for inherently not codable concepts without codable children: Secondary | ICD-10-CM

## 2011-10-20 HISTORY — DX: Reserved for inherently not codable concepts without codable children: IMO0001

## 2011-10-20 NOTE — Progress Notes (Signed)
Patient has been monitored for 20 minutes as directed by Dr. Kathrynn Running. Patient is alert and oriented to person, place, and time. No distress noted. Steady gait noted. Pleasant affect noted. Patient denies pain at this time. Patient denies nausea, headache, or dizziness. Patient denies diplopia but, reports floaters continue. Vitals stable. Patient reports taking decadron "as needed but not everyday." Patient questions if she should take this medication everyday. Reported all findings to Dr. Kathrynn Running.     Not taking decadron daily

## 2011-10-20 NOTE — Progress Notes (Signed)
  Radiation Oncology         [phone] ________________________________  Stereotactic Treatment Procedure Note  Name: FRONIA DEPASS MRN: [id number]  Date: 10/20/2011  DOB: 1950-08-15  SPECIAL TREATMENT PROCEDURE  3D TREATMENT PLANNING AND DOSIMETRY:  The patient's radiation plan was reviewed and approved by Dr. Franky Macho from neurosurgery and radiation oncology prior to treatment.  It showed 3-dimensional radiation distributions overlaid onto the planning CT/MRI image set.  The [facility] for the target structures as well as the organs at risk were reviewed. The documentation of the 3D plan and dosimetry are filed in the radiation oncology EMR.  NARRATIVE:  Marshelle P Hillman was brought to the TrueBeam stereotactic radiation treatment machine and placed supine on the CT couch. The head frame was applied, and the patient was set up for stereotactic radiosurgery.  Neurosurgery was present for the set-up and delivery  SIMULATION VERIFICATION:  In the couch zero-angle position, the patient underwent Exactrac imaging using the Brainlab system with orthogonal KV images.  These were carefully aligned and repeated to confirm treatment position for each of the isocenters.  The Exactrac snap film verification was repeated at each couch angle.  SPECIAL TREATMENT PROCEDURE: Monya P Rule received stereotactic radiosurgery to the following targets: Right 4 mm posterior frontal target was treated using 3 Circular Arcs with the 7.5 mm collimator to a prescription dose of 20 Gy.  ExacTrac Snap verification was performed for each couch angle.  STEREOTACTIC TREATMENT MANAGEMENT:  Following delivery, the patient was transported to nursing in stable condition and monitored for possible acute effects.  Vital signs were recorded  Filed Vitals:   10/20/11 1324  BP: 124/85  Pulse: 74  Temp: 98.5 F (36.9 C)  Resp: 20  The patient tolerated treatment without significant acute effects, and was discharged to home in stable  condition.    PLAN: Follow-up in one month.  ________________________________  Artist Pais. Kathrynn Running, M.D.

## 2011-10-23 NOTE — Progress Notes (Signed)
  Radiation Oncology         [phone] ________________________________  Name: Courtney West MRN: [id number]  Date: 10/20/2011  DOB: [birth year]  End of Treatment Note  Diagnosis:   61 yo woman with a new 4 mm right frontal metastasis from NSCLC     Indication for treatment:  Palliation, local control       Radiation treatment dates:  10/20/2011  Site/dose:   Right 4 mm posterior frontal target was treated using 3 Circular Arcs with the 7.5 mm collimator to a prescription dose of 20 Gy. ExacTrac Snap verification was performed for each couch angle.  Beams/energy:   6 mV photons were delivered with the flattening filter free setting  Narrative: The patient tolerated radiation treatment relatively well.   She experienced no acute complications during her treatment or immediately thereafter.  Plan: The patient has completed radiation treatment. The patient will return to radiation oncology clinic for routine followup in one month. I advised them to call or return sooner if they have any questions or concerns related to their recovery or treatment. ________________________________  Artist Pais. Kathrynn Running, M.D.

## 2011-10-24 ENCOUNTER — Encounter: Payer: Self-pay | Admitting: *Deleted

## 2011-10-24 NOTE — Progress Notes (Signed)
Clinical Social Work was referred by Oretha Milch, financial advocate. CSW met with patient after radiation appointment to assess for psychosocial needs. Courtney West states her biggest concern at this time is her financial situation. She reports having difficulty paying her rent and utilities in addition to the cost of transportation traveling to and from [place].  CSW provided patient with resources for financial assistance including [employer] and [employer].  CSW encouraged patient to contact those organizations.  CSW also assisted patient in completing application for Courtney West support fund (fund for [employer]. Residents) and completed referral letter.  CSW encouraged patient to call with any additional questions or concerns.  Kathrin Penner, MSW, [facility] Clinical Social Worker [facility] [phone]

## 2011-11-18 ENCOUNTER — Encounter (HOSPITAL_COMMUNITY): Payer: Self-pay

## 2011-11-18 ENCOUNTER — Emergency Department (HOSPITAL_COMMUNITY)
Admission: EM | Admit: 2011-11-18 | Discharge: 2011-11-18 | Disposition: A | Discharge status: Home / Self Care | Payer: Medicare Other | Attending: Emergency Medicine | Admitting: Emergency Medicine

## 2011-11-18 DIAGNOSIS — J4489 Other specified chronic obstructive pulmonary disease: Secondary | ICD-10-CM | POA: Insufficient documentation

## 2011-11-18 DIAGNOSIS — C7949 Secondary malignant neoplasm of other parts of nervous system: Secondary | ICD-10-CM | POA: Insufficient documentation

## 2011-11-18 DIAGNOSIS — I10 Essential (primary) hypertension: Secondary | ICD-10-CM | POA: Insufficient documentation

## 2011-11-18 DIAGNOSIS — E785 Hyperlipidemia, unspecified: Secondary | ICD-10-CM | POA: Insufficient documentation

## 2011-11-18 DIAGNOSIS — J449 Chronic obstructive pulmonary disease, unspecified: Secondary | ICD-10-CM | POA: Insufficient documentation

## 2011-11-18 DIAGNOSIS — Z8673 Personal history of transient ischemic attack (TIA), and cerebral infarction without residual deficits: Secondary | ICD-10-CM | POA: Insufficient documentation

## 2011-11-18 DIAGNOSIS — C7931 Secondary malignant neoplasm of brain: Secondary | ICD-10-CM | POA: Insufficient documentation

## 2011-11-18 DIAGNOSIS — F17200 Nicotine dependence, unspecified, uncomplicated: Secondary | ICD-10-CM | POA: Insufficient documentation

## 2011-11-18 DIAGNOSIS — R0789 Other chest pain: Secondary | ICD-10-CM

## 2011-11-18 DIAGNOSIS — C3490 Malignant neoplasm of unspecified part of unspecified bronchus or lung: Secondary | ICD-10-CM | POA: Insufficient documentation

## 2011-11-18 DIAGNOSIS — R071 Chest pain on breathing: Secondary | ICD-10-CM | POA: Insufficient documentation

## 2011-11-18 MED ORDER — HYDROMORPHONE HCL PF 1 MG/ML IJ SOLN
1.0000 mg | Freq: Once | INTRAMUSCULAR | Status: AC
  Administered 2011-11-18: 1 mg via INTRAMUSCULAR
  Filled 2011-11-18: qty 1

## 2011-11-18 NOTE — ED Provider Notes (Signed)
History     CSN: [id number]  Arrival date & time 11/18/11  0254   None     Chief Complaint  Patient presents with  . Flank Pain    (Consider location/radiation/quality/duration/timing/severity/associated sxs/prior treatment) HPI  Courtney West is a 61 y.o. female with a h/o lung cancer s/p chemo and radiation  who presents to the Emergency Department complaining of persistent, continuing, left sided flank and rib pain that she has had for months.She is currently under the care of a pain clinic and her oncologist Dr. Arbutus Ped, both of which provide her with analgesics for her pain. She states the medicines do not seem to be working and tonight the pain in her left side and flank are keeping her awake. She is asking for a shot to help with her pain.  PCP Dr. Tiburcio Pea Oncologist Dr. Arbutus Ped  Past Medical History  Diagnosis Date  . Hypertension   . COPD (chronic obstructive pulmonary disease)   . Stroke   . Abdominal aortic aneurysm   . Dyslipidemia   . S/P radiation therapy 7/12 thru 9/12, 04/18/11    xrt to brain mets  . Lung cancer 12/2010  . Brain cancer     mets from lung primary  . Allergy     codeine,pcn,sulfa drugs    Past Surgical History  Procedure Date  . Carpal tunnel release   . Left knee surgery   . Cholecystectomy   . Partial hysterectomy     Family History  Problem Relation Age of Onset  . Cancer Father     lung, leukemia  . Cancer Maternal Aunt     kidney    History  Substance Use Topics  . Smoking status: Current Everyday Smoker -- 1.0 packs/day for 50 years    Types: Cigarettes  . Smokeless tobacco: Not on file  . Alcohol Use: Yes     occassional    OB History    Grav Para Term Preterm Abortions TAB SAB Ect Mult Living                  Review of Systems  Constitutional: Negative for fever.       10 Systems reviewed and are negative for acute change except as noted in the HPI.  HENT: Negative for congestion.   Eyes: Negative for  discharge and redness.  Respiratory: Negative for cough and shortness of breath.        Left sided rib pain  Cardiovascular: Negative for chest pain.  Gastrointestinal: Negative for vomiting and abdominal pain.  Genitourinary: Positive for flank pain.  Musculoskeletal: Negative for back pain.  Skin: Negative for rash.  Neurological: Negative for syncope, numbness and headaches.  Psychiatric/Behavioral:       No behavior change.    Allergies  Carboplatin; Codeine; Penicillins; and Sulfa antibiotics  Home Medications   Current Outpatient Rx  Name Route Sig Dispense Refill  . AMITRIPTYLINE HCL 100 MG PO TABS Oral Take 200 mg by mouth at bedtime.      . BUDESONIDE-FORMOTEROL FUMARATE 80-4.5 MCG/ACT IN AERO Inhalation Inhale 2 puffs into the lungs 2 (two) times daily.      Marland Kitchen DILTIAZEM HCL 120 MG PO CP24 Oral Take 120 mg by mouth daily.      Marland Kitchen FOLIC ACID 1 MG PO TABS  TAKE ONE TABLET BY MOUTH EVERY DAY 30 tablet 2  . HYDROCODONE-ACETAMINOPHEN 5-325 MG PO TABS Oral Take 1 tablet by mouth every 6 (six) hours as needed. pain    .  PROCHLORPERAZINE MALEATE 10 MG PO TABS Oral Take 10 mg by mouth every 6 (six) hours as needed. For nausea      BP 145/73  Pulse 88  Temp(Src) 97.9 F (36.6 C) (Oral)  Resp 16  SpO2 98%  Physical Exam  Nursing note and vitals reviewed. Constitutional:       Awake, alert, nontoxic appearance.  HENT:  Head: Atraumatic.  Eyes: Right eye exhibits no discharge. Left eye exhibits no discharge.  Neck: Neck supple.  Cardiovascular: Normal rate, regular rhythm and normal heart sounds.   Pulmonary/Chest: Effort normal and breath sounds normal. No respiratory distress. She exhibits tenderness.       No reproducible discomfort to palpation of left rib cage.  Abdominal: Soft. There is no tenderness. There is no rebound.  Musculoskeletal: She exhibits no tenderness.       Baseline ROM, no obvious new focal weakness.  Neurological:       Mental status and motor  strength appears baseline for patient and situation.  Skin: No rash noted.  Psychiatric: She has a normal mood and affect.    ED Course  Procedures (including critical care time)  Labs Reviewed - No data to display No results found.      MDM  Patient with h/o lung cancer and persistent left sided flank and rib cage pain. Given analgesic with some relief.Pt stable in ED with no significant deterioration in condition.The patient appears reasonably screened and/or stabilized for discharge and I doubt any other medical condition or other [facility] requiring further screening, evaluation, or treatment in the ED at this time prior to discharge.  MDM Number of Diagnoses or Management Options Chest wall pain:  /  MDM Reviewed: nursing note, vitals and previous chart Reviewed previous: CT scan, MRI and x-ray         Nicoletta Dress. Colon Branch, MD 11/20/11 2211

## 2011-11-18 NOTE — ED Notes (Signed)
Pt with hx of lung Cancer, states she has had this pain to left side/ribs for " a long time"  And states she sees a pain specialist for same, states she is just not getting relief from meds she has gotten  In the past.

## 2011-11-18 NOTE — Discharge Instructions (Signed)
Continue your home medicines. Keep your appointment with the pain management doctor.     Chest Wall Pain Chest wall pain is pain in or around the bones and muscles of your chest. It may take up to 6 weeks to get better. It may take longer if you must stay physically active in your work and activities.  CAUSES  Chest wall pain may happen on its own. However, it may be caused by:  A viral illness like the flu.   Injury.   Coughing.   Exercise.   Arthritis.   Fibromyalgia.   Shingles.  HOME CARE INSTRUCTIONS   Avoid overtiring physical activity. Try not to strain or perform activities that cause pain. This includes any activities using your chest or your abdominal and side muscles, especially if heavy weights are used.   Put ice on the sore area.   Put ice in a plastic bag.   Place a towel between your skin and the bag.   Leave the ice on for 15 to 20 minutes per hour while awake for the first 2 days.   Only take over-the-counter or prescription medicines for pain, discomfort, or fever as directed by your caregiver.  SEEK IMMEDIATE MEDICAL CARE IF:   Your pain increases, or you are very uncomfortable.   You have a fever.   Your chest pain becomes worse.   You have new, unexplained symptoms.   You have nausea or vomiting.   You feel sweaty or lightheaded.   You have a cough with phlegm (sputum), or you cough up blood.  MAKE SURE YOU:   Understand these instructions.   Will watch your condition.   Will get help right away if you are not doing well or get worse.  Document Released: 06/09/2005 Document Revised: 05/29/2011 Document Reviewed: 02/03/2011 [facility] Patient Information 2012 [place], [place].

## 2011-11-18 NOTE — ED Notes (Signed)
Patient states that she has driver to drive her home, states that she takes oxycodone and a bunch of medications often and is still able to drive. Notified patient that we cannot let her receive medication and drive home due to risk of getting a dui and hospital being reliable. Patient verbalized understanding.

## 2011-11-20 ENCOUNTER — Other Ambulatory Visit: Payer: Self-pay | Admitting: Radiation Therapy

## 2011-11-20 DIAGNOSIS — C7931 Secondary malignant neoplasm of brain: Secondary | ICD-10-CM

## 2011-11-21 ENCOUNTER — Telehealth: Payer: Self-pay | Admitting: Internal Medicine

## 2011-11-21 NOTE — Telephone Encounter (Signed)
Talked to pt, gave her appt for lab and CT for 12/24/11 and MD a few days later, pt has oral contrast and she is aware to be NPO 4 hrs prior to CT

## 2011-11-24 ENCOUNTER — Ambulatory Visit
Admission: RE | Admit: 2011-11-24 | Discharge: 2011-11-24 | Disposition: A | Discharge status: Home / Self Care | Payer: Medicare Other | Source: Ambulatory Visit | Attending: Radiation Oncology | Admitting: Radiation Oncology

## 2011-11-24 ENCOUNTER — Encounter: Payer: Self-pay | Admitting: Radiation Oncology

## 2011-11-24 VITALS — BP 123/81 | HR 79 | Temp 97.5°F | Wt 171.3 lb

## 2011-11-24 DIAGNOSIS — C719 Malignant neoplasm of brain, unspecified: Secondary | ICD-10-CM

## 2011-11-24 DIAGNOSIS — C3490 Malignant neoplasm of unspecified part of unspecified bronchus or lung: Secondary | ICD-10-CM

## 2011-11-24 MED ORDER — HYDROCODONE-ACETAMINOPHEN 5-325 MG PO TABS: 1.0000 | ORAL_TABLET | Freq: Four times a day (QID) | ORAL | Status: DC | PRN

## 2011-11-24 NOTE — Progress Notes (Signed)
  Radiation Oncology         [phone] ________________________________  Name: Courtney West MRN: [id number]  Date: 11/24/2011  DOB: [birth year]  Follow-Up Visit Note  CC: [name], MD, MD  Carmela Hurt, MD  Diagnosis:   61 year old woman with multiple brain metastases from metastatic non-small cell lung cancer  Interval Since Last Radiation:  1 months  Narrative:  The patient returns today for routine follow-up.  She is essentially without complaint. She denies headaches nausea and vomiting. She has had some left chest wall pain. She was evaluated by pain management and referred for cryoablation of a left thoracic nerve root in [place].                              ALLERGIES:  is allergic to carboplatin; codeine; penicillins; sulfa antibiotics; and dilaudid.  Meds: Current Outpatient Prescriptions  Medication Sig Dispense Refill  . amitriptyline (ELAVIL) 100 MG tablet Take 200 mg by mouth at bedtime.        . budesonide-formoterol (SYMBICORT) 80-4.5 MCG/ACT inhaler Inhale 2 puffs into the lungs 2 (two) times daily.        Marland Kitchen diltiazem (DILACOR XR) 120 MG 24 hr capsule Take 120 mg by mouth daily.        . folic acid (FOLVITE) 1 MG tablet TAKE ONE TABLET BY MOUTH EVERY DAY  30 tablet  2  . HYDROcodone-acetaminophen (NORCO) 5-325 MG per tablet Take 1-2 tablets by mouth every 6 (six) hours as needed for pain. pain  60 tablet  1  . prochlorperazine (COMPAZINE) 10 MG tablet Take 10 mg by mouth every 6 (six) hours as needed. For nausea        Physical Findings: The patient is in no acute distress. Patient is alert and oriented.  weight is 171 lb 4.8 oz (77.701 kg). Her temperature is 97.5 F (36.4 C). Her blood pressure is 123/81 and her pulse is 79. Her oxygen saturation is 94%. .  No significant changes.  Lab Findings: Lab Results  Component Value Date   WBC 9.1 09/23/2011   HGB 15.5 09/23/2011   HCT 46.1 09/23/2011   MCV 95.9 09/23/2011   PLT 194 09/23/2011   Impression:   The patient is recovering from the effects of radiation.  She has no clinical evidence to suggest recurrent or progressive brain metastases. She also has no pulmonary symptoms to suggest thoracic recurrence.  Plan:  The patient will return for followup CT of the chest abdomen and pelvis on July 3 and then see Dr. Arbutus Ped. After that visit, she'll have brain MRI performed at the end of July and see Dr. Franky Macho for followup a few days later.  _____________________________________  Artist Pais. Kathrynn Running, M.D.

## 2011-11-24 NOTE — Progress Notes (Addendum)
Here for follow up post chest and brain mets radiation treatment. Radiation completed 10/20/11.Denies shortness of breath. Occasional productive cough and headache. Has run out of pain medication.Dr.Harkins is referring to pain management in  [place].  Reviewed - MM

## 2011-12-24 ENCOUNTER — Other Ambulatory Visit (HOSPITAL_BASED_OUTPATIENT_CLINIC_OR_DEPARTMENT_OTHER): Payer: Medicare Other

## 2011-12-24 ENCOUNTER — Ambulatory Visit (HOSPITAL_COMMUNITY)
Admission: RE | Admit: 2011-12-24 | Discharge: 2011-12-24 | Disposition: A | Discharge status: Home / Self Care | Payer: Medicare Other | Source: Ambulatory Visit | Attending: Internal Medicine | Admitting: Internal Medicine

## 2011-12-24 DIAGNOSIS — K4020 Bilateral inguinal hernia, without obstruction or gangrene, not specified as recurrent: Secondary | ICD-10-CM | POA: Insufficient documentation

## 2011-12-24 DIAGNOSIS — I719 Aortic aneurysm of unspecified site, without rupture: Secondary | ICD-10-CM | POA: Insufficient documentation

## 2011-12-24 DIAGNOSIS — R911 Solitary pulmonary nodule: Secondary | ICD-10-CM | POA: Insufficient documentation

## 2011-12-24 DIAGNOSIS — C3490 Malignant neoplasm of unspecified part of unspecified bronchus or lung: Secondary | ICD-10-CM

## 2011-12-24 DIAGNOSIS — R05 Cough: Secondary | ICD-10-CM | POA: Insufficient documentation

## 2011-12-24 DIAGNOSIS — I700 Atherosclerosis of aorta: Secondary | ICD-10-CM | POA: Insufficient documentation

## 2011-12-24 DIAGNOSIS — R059 Cough, unspecified: Secondary | ICD-10-CM | POA: Insufficient documentation

## 2011-12-24 LAB — CBC WITH DIFFERENTIAL/PLATELET
BASO%: 0.6 % (ref 0.0–2.0)
Basophils Absolute: 0.1 10*3/uL (ref 0.0–0.1)
EOS%: 1.6 % (ref 0.0–7.0)
HGB: 15.4 g/dL (ref 11.6–15.9)
MCH: 32.4 pg (ref 25.1–34.0)
MCHC: 34.5 g/dL (ref 31.5–36.0)
MCV: 93.7 fL (ref 79.5–101.0)
MONO%: 8 % (ref 0.0–14.0)
RBC: 4.76 10*6/uL (ref 3.70–5.45)
RDW: 14.2 % (ref 11.2–14.5)
lymph#: 2.3 10*3/uL (ref 0.9–3.3)

## 2011-12-24 LAB — CMP (CANCER CENTER ONLY)
ALT(SGPT): 18 U/L (ref 10–47)
AST: 18 U/L (ref 11–38)
Albumin: 3.5 g/dL (ref 3.3–5.5)
Alkaline Phosphatase: 98 U/L — ABNORMAL HIGH (ref 26–84)
BUN, Bld: 13 mg/dL (ref 7–22)
Chloride: 99 mEq/L (ref 98–108)
Potassium: 4.7 mEq/L (ref 3.3–4.7)

## 2011-12-24 MED ORDER — IOHEXOL 300 MG/ML  SOLN
100.0000 mL | Freq: Once | INTRAMUSCULAR | Status: AC | PRN
  Administered 2011-12-24: 100 mL via INTRAVENOUS

## 2011-12-30 ENCOUNTER — Ambulatory Visit (HOSPITAL_BASED_OUTPATIENT_CLINIC_OR_DEPARTMENT_OTHER): Payer: Medicare Other | Admitting: Internal Medicine

## 2011-12-30 ENCOUNTER — Telehealth: Payer: Self-pay | Admitting: Internal Medicine

## 2011-12-30 ENCOUNTER — Other Ambulatory Visit (HOSPITAL_COMMUNITY): Payer: Medicare Other

## 2011-12-30 VITALS — BP 101/64 | HR 80 | Temp 97.3°F | Ht 63.5 in | Wt 177.1 lb

## 2011-12-30 DIAGNOSIS — C3490 Malignant neoplasm of unspecified part of unspecified bronchus or lung: Secondary | ICD-10-CM

## 2011-12-30 DIAGNOSIS — C7949 Secondary malignant neoplasm of other parts of nervous system: Secondary | ICD-10-CM

## 2011-12-30 DIAGNOSIS — C3430 Malignant neoplasm of lower lobe, unspecified bronchus or lung: Secondary | ICD-10-CM

## 2011-12-30 DIAGNOSIS — C7931 Secondary malignant neoplasm of brain: Secondary | ICD-10-CM

## 2011-12-30 NOTE — Telephone Encounter (Signed)
appts made and printed for pt aom °

## 2011-12-30 NOTE — Progress Notes (Signed)
[facility] [phone]   [phone]  OFFICE PROGRESS NOTE  [name], MD [facility] [address] [place] [address]  DIAGNOSIS: Metastatic non-small cell lung cancer, favoring adenocarcinoma diagnosed in July of 2012, presented with locally advanced disease in the chest as well as brain metastasis.   PRIOR THERAPY:  1. Status post stereotactic radiotherapy to 2 brain lesions under the care of Dr. Mitzi Hansen. 2. Status post a course of concurrent chemoradiation with weekly carboplatin and paclitaxel, last dose of chemotherapy was given 03/05/2011. 3. Systemic chemotherapy with carboplatin for AUC of 5 and Alimta 500 mg/M2. The patient is status post 3 cycles.  CURRENT THERAPY: Observation.  INTERVAL HISTORY: Courtney West 61 y.o. female returns to the clinic today for three-month followup visit accompanied by her sister and daughter. The patient is doing fine with no specific complaints. Unfortunately she continues to smoke and I again strongly recommended for her to quit smoking. She denied having any significant chest pain or shortness of breath. She has mild cough but no hemoptysis. No significant weight loss or night sweats. She has repeat CT scan of the chest, abdomen and pelvis performed recently and she is here today for evaluation and discussion of her scan results.   MEDICAL HISTORY: Past Medical History  Diagnosis Date  . Hypertension   . COPD (chronic obstructive pulmonary disease)   . Stroke   . Abdominal aortic aneurysm   . Dyslipidemia   . S/P radiation therapy 7/12 thru 9/12, 04/18/11    xrt to brain mets  . Lung cancer 12/2010  . Brain cancer     mets from lung primary  . Allergy     codeine,pcn,sulfa drugs    ALLERGIES:  is allergic to carboplatin; codeine; penicillins; sulfa antibiotics; and dilaudid.  MEDICATIONS:  Current Outpatient Prescriptions  Medication Sig Dispense Refill    . amitriptyline (ELAVIL) 100 MG tablet Take 200 mg by mouth at bedtime.        . budesonide-formoterol (SYMBICORT) 80-4.5 MCG/ACT inhaler Inhale 2 puffs into the lungs 2 (two) times daily.        Marland Kitchen diltiazem (DILACOR XR) 120 MG 24 hr capsule Take 120 mg by mouth daily.        . folic acid (FOLVITE) 1 MG tablet TAKE ONE TABLET BY MOUTH EVERY DAY  30 tablet  2  . HYDROcodone-acetaminophen (NORCO) 5-325 MG per tablet Take 1-2 tablets by mouth every 6 (six) hours as needed for pain. pain  60 tablet  1  . prochlorperazine (COMPAZINE) 10 MG tablet Take 10 mg by mouth every 6 (six) hours as needed. For nausea        SURGICAL HISTORY:  Past Surgical History  Procedure Date  . Carpal tunnel release   . Left knee surgery   . Cholecystectomy   . Partial hysterectomy     REVIEW OF SYSTEMS:  A comprehensive review of systems was negative.   PHYSICAL EXAMINATION: General appearance: alert, cooperative and no distress Neck: no adenopathy Lymph nodes: Cervical, supraclavicular, and axillary nodes normal. Resp: clear to auscultation bilaterally Cardio: regular rate and rhythm, S1, S2 normal, no murmur, click, rub or gallop GI: soft, non-tender; bowel sounds normal; no masses,  no organomegaly Extremities: extremities normal, atraumatic, no cyanosis or edema Neurologic: Alert and oriented X 3, normal strength and tone. Normal symmetric reflexes. Normal coordination and gait  ECOG PERFORMANCE STATUS: 1 - Symptomatic but completely ambulatory  Blood pressure 101/64, pulse 80, temperature 97.3 F (36.3 C), temperature source Oral, height 5' 3.5" (1.613 m), weight 177 lb 1.6 oz (80.332 kg).  LABORATORY DATA: Lab Results  Component Value Date   WBC 10.4* 12/24/2011   HGB 15.4 12/24/2011   HCT 44.6 12/24/2011   MCV 93.7 12/24/2011   PLT 207 12/24/2011      Chemistry      Component Value Date/Time   NA 143 12/24/2011 1311   NA 138 09/06/2011 2145   K 4.7 12/24/2011 1311   K 4.3 09/06/2011 2145   CL 99  12/24/2011 1311   CL 102 09/06/2011 2145   CO2 31 12/24/2011 1311   CO2 29 09/06/2011 2145   BUN 13 12/24/2011 1311   BUN 22 09/06/2011 2145   CREATININE 1.2 12/24/2011 1311   CREATININE 1.15* 09/06/2011 2145      Component Value Date/Time   CALCIUM 10.2 12/24/2011 1311   CALCIUM 9.6 09/06/2011 2145   ALKPHOS 98* 12/24/2011 1311   ALKPHOS 103 07/30/2011 0053   AST 18 12/24/2011 1311   AST 13 07/30/2011 0053   ALT 12 07/30/2011 0053   BILITOT 0.50 12/24/2011 1311   BILITOT 0.1* 07/30/2011 0053       RADIOGRAPHIC STUDIES: Ct Chest W Contrast  12/24/2011  *RADIOLOGY REPORT*  Clinical Data:  Lung cancer.  Cough.  CT CHEST, ABDOMEN AND PELVIS WITH CONTRAST  Technique:  Multidetector CT imaging of the chest, abdomen and pelvis was performed following the standard protocol during bolus administration of intravenous contrast.  Contrast: OMNIPAQUE IOHEXOL 300 MG/ML  SOLN  Comparison:  09/23/2011.   CT CHEST  Findings:  No pathologically enlarged mediastinal lymph nodes. Right hilar lymph nodes measure up to approximately 8 mm in short axis.  No axillary adenopathy.  Atherosclerotic calcification of the arterial vasculature, including coronary arteries.  Heart size normal.  No pericardial effusion.  Emphysema.  Radiation fibrosis and volume loss in the medial left hemithorax, stable.  Nodules in the right lower lobe measure up to 1.6 cm (image 33), previously 1.1 cm.  Posterior right upper lobe nodule is unchanged.  No pleural fluid.  Airway is otherwise unremarkable.  IMPRESSION:  1.  Enlarging right lower lobe nodules, most consistent with bronchogenic carcinoma. 2.  Stable radiation changes in the left hemithorax.   CT ABDOMEN AND PELVIS  Findings:  Liver is slightly decreased in attenuation diffusely but otherwise unremarkable.  Cholecystectomy.  Adrenal glands and kidneys are unremarkable.  Postoperative changes are seen along the lateral margin of the spleen.  Spleen, pancreas, stomach and bowel are otherwise  unremarkable.  Atherosclerotic calcification of the arterial vasculature with an infrarenal aortic aneurysm, measuring 4.7 x 4.9 cm, stable.  Small bilateral inguinal hernias contain fat.  No pathologically enlarged lymph nodes.  No free fluid.  Circumaortic left renal vein.  No worrisome lytic or sclerotic lesions. L2 compression fracture is unchanged.  IMPRESSION:  1.  No evidence of metastatic disease in the abdomen or pelvis. 2.  Probable hepatic steatosis. 3.  Infrarenal aortic aneurysm. 4.  Small bilateral inguinal hernias contain fat.  Original Report Authenticated By: Reyes Ivan, M.D.     ASSESSMENT: This is a very pleasant 61 years old white female with metastatic non-small cell lung cancer favoring adenocarcinoma status post stereotactic radiotherapy to 2 brain lesions as well as concurrent chemoradiation followed by 3 cycles of systemic chemotherapy with carboplatin and Alimta. The patient is doing fine with no evidence for disease progression  except for mildly enlarging right lower lobe nodule.  PLAN: I discussed the scan results with the patient and her family. I gave her the option of resuming systemic chemotherapy versus palliative radiation to the enlarging right lower lobe lung nodule by Dr. Kathrynn Running. After discussion of the 2 options the patient prefers to proceed with radiation therapy. I will arrange an appointment for her with Dr. Kathrynn Running next week for evaluation. I would see her back for followup visit in 3 months with repeat CT scan of the chest, abdomen and pelvis for restaging of her disease. He was advised to call me immediately if she has any concerning symptoms in the interval.  All questions were answered. The patient knows to call the clinic with any problems, questions or concerns. We can certainly see the patient much sooner if necessary.

## 2011-12-31 ENCOUNTER — Encounter: Payer: Self-pay | Admitting: Radiation Oncology

## 2012-01-05 ENCOUNTER — Ambulatory Visit
Admission: RE | Admit: 2012-01-05 | Discharge: 2012-01-05 | Disposition: A | Discharge status: Home / Self Care | Payer: Medicare Other | Source: Ambulatory Visit | Attending: Radiation Oncology | Admitting: Radiation Oncology

## 2012-01-05 VITALS — BP 103/61 | HR 81 | Temp 97.3°F | Wt 179.9 lb

## 2012-01-05 DIAGNOSIS — C3490 Malignant neoplasm of unspecified part of unspecified bronchus or lung: Secondary | ICD-10-CM

## 2012-01-05 DIAGNOSIS — C7949 Secondary malignant neoplasm of other parts of nervous system: Secondary | ICD-10-CM

## 2012-01-05 NOTE — Progress Notes (Signed)
Patient post chemo and radiation for consideration of palliative radiation of enlarging right lower lobe lung nodule.Patient denies shortness of breath. Continues to smoke 1 pack  of cigarettes per day.

## 2012-01-08 ENCOUNTER — Ambulatory Visit
Admission: RE | Admit: 2012-01-08 | Discharge: 2012-01-08 | Disposition: A | Discharge status: Home / Self Care | Payer: Medicare Other | Source: Ambulatory Visit | Attending: Radiation Oncology | Admitting: Radiation Oncology

## 2012-01-08 ENCOUNTER — Encounter: Payer: Self-pay | Admitting: Radiation Oncology

## 2012-01-08 DIAGNOSIS — C3490 Malignant neoplasm of unspecified part of unspecified bronchus or lung: Secondary | ICD-10-CM

## 2012-01-08 NOTE — Progress Notes (Signed)
Radiation Oncology         [phone]) [phone] ________________________________  Name: Courtney West MRN: [id number]  Date: 01/05/2012  DOB: 09-14-1950  Follow-Up Visit Note  CC: [name], MD  [name], MD  Diagnosis:   61 year old woman with multiple brain metastases from metastatic non-small cell lung cancer and a new 1.6 cm metachronous clinical stage IA primary cancer of the right lower lung  Narrative:  The patient returns today for routine follow-up.  She is well in our clinic. In recent chest CT imaging, her treated locally advanced lung cancer appears to be controlled. Her right lower lung contains a soft tissue nodule which is increased from 1.1 to 1.6 cm. This likely represents a second small primary lung cancer. The patient is not ideally suited for surgical resection and has, been referred today for discussion of potential radiation treatment options.                             ALLERGIES:  is allergic to carboplatin; codeine; penicillins; sulfa antibiotics; and dilaudid.  Meds: Current Outpatient Prescriptions  Medication Sig Dispense Refill  . amitriptyline (ELAVIL) 100 MG tablet Take 200 mg by mouth at bedtime.        . budesonide-formoterol (SYMBICORT) 80-4.5 MCG/ACT inhaler Inhale 2 puffs into the lungs 2 (two) times daily.        Marland Kitchen diltiazem (DILACOR XR) 120 MG 24 hr capsule Take 120 mg by mouth daily.        Marland Kitchen HYDROcodone-acetaminophen (NORCO) 5-325 MG per tablet Take 1-2 tablets by mouth every 6 (six) hours as needed for pain. pain  60 tablet  1  . prochlorperazine (COMPAZINE) 10 MG tablet Take 10 mg by mouth every 6 (six) hours as needed. For nausea      . folic acid (FOLVITE) 1 MG tablet TAKE ONE TABLET BY MOUTH EVERY DAY  30 tablet  2    Physical Findings: The patient is in no acute distress. Patient is alert and oriented.  weight is 179 lb 14.4 oz (81.602 kg). Her temperature is 97.3 F (36.3 C). Her blood pressure is 103/61 and her pulse is 81. Her  oxygen saturation is 96%. Marland Kitchen Respiratory effort is unremarkable. Lung sounds are distant but lungs are clear. No significant changes.  Lab Findings: Lab Results  Component Value Date   WBC 10.4* 12/24/2011   HGB 15.4 12/24/2011   HCT 44.6 12/24/2011   MCV 93.7 12/24/2011   PLT 207 12/24/2011   Radiographic Findings: Ct Chest W Contrast  12/24/2011  *RADIOLOGY REPORT*  Clinical Data:  Lung cancer.  Cough.  CT CHEST, ABDOMEN AND PELVIS WITH CONTRAST  Technique:  Multidetector CT imaging of the chest, abdomen and pelvis was performed following the standard protocol during bolus administration of intravenous contrast.  Contrast: OMNIPAQUE IOHEXOL 300 MG/ML  SOLN  Comparison:  09/23/2011.  CT CHEST  Findings:  No pathologically enlarged mediastinal lymph nodes. Right hilar lymph nodes measure up to approximately 8 mm in short axis.  No axillary adenopathy.  Atherosclerotic calcification of the arterial vasculature, including coronary arteries.  Heart size normal.  No pericardial effusion.  Emphysema.  Radiation fibrosis and volume loss in the medial left hemithorax, stable.  Nodules in the right lower lobe measure up to 1.6 cm (image 33), previously 1.1 cm.  Posterior right upper lobe nodule is unchanged.  No pleural fluid.  Airway is otherwise unremarkable.  IMPRESSION:  1.  Enlarging right lower lobe nodules, most consistent with bronchogenic carcinoma. 2.  Stable radiation changes in the left hemithorax.  CT ABDOMEN AND PELVIS  Findings:  Liver is slightly decreased in attenuation diffusely but otherwise unremarkable.  Cholecystectomy.  Adrenal glands and kidneys are unremarkable.  Postoperative changes are seen along the lateral margin of the spleen.  Spleen, pancreas, stomach and bowel are otherwise unremarkable.  Atherosclerotic calcification of the arterial vasculature with an infrarenal aortic aneurysm, measuring 4.7 x 4.9 cm, stable.  Small bilateral inguinal hernias contain fat.  No pathologically  enlarged lymph nodes.  No free fluid.  Circumaortic left renal vein.  No worrisome lytic or sclerotic lesions. L2 compression fracture is unchanged.  IMPRESSION:  1.  No evidence of metastatic disease in the abdomen or pelvis. 2.  Probable hepatic steatosis. 3.  Infrarenal aortic aneurysm. 4.  Small bilateral inguinal hernias contain fat.  Original Report Authenticated By: Reyes Ivan, M.D.   Ct Abdomen Pelvis W Contrast  12/24/2011  *RADIOLOGY REPORT*  Clinical Data:  Lung cancer.  Cough.  CT CHEST, ABDOMEN AND PELVIS WITH CONTRAST  Technique:  Multidetector CT imaging of the chest, abdomen and pelvis was performed following the standard protocol during bolus administration of intravenous contrast.  Contrast: OMNIPAQUE IOHEXOL 300 MG/ML  SOLN  Comparison:  09/23/2011.  CT CHEST  Findings:  No pathologically enlarged mediastinal lymph nodes. Right hilar lymph nodes measure up to approximately 8 mm in short axis.  No axillary adenopathy.  Atherosclerotic calcification of the arterial vasculature, including coronary arteries.  Heart size normal.  No pericardial effusion.  Emphysema.  Radiation fibrosis and volume loss in the medial left hemithorax, stable.  Nodules in the right lower lobe measure up to 1.6 cm (image 33), previously 1.1 cm.  Posterior right upper lobe nodule is unchanged.  No pleural fluid.  Airway is otherwise unremarkable.  IMPRESSION:  1.  Enlarging right lower lobe nodules, most consistent with bronchogenic carcinoma. 2.  Stable radiation changes in the left hemithorax.  CT ABDOMEN AND PELVIS  Findings:  Liver is slightly decreased in attenuation diffusely but otherwise unremarkable.  Cholecystectomy.  Adrenal glands and kidneys are unremarkable.  Postoperative changes are seen along the lateral margin of the spleen.  Spleen, pancreas, stomach and bowel are otherwise unremarkable.  Atherosclerotic calcification of the arterial vasculature with an infrarenal aortic aneurysm, measuring  4.7 x 4.9 cm, stable.  Small bilateral inguinal hernias contain fat.  No pathologically enlarged lymph nodes.  No free fluid.  Circumaortic left renal vein.  No worrisome lytic or sclerotic lesions. L2 compression fracture is unchanged.  IMPRESSION:  1.  No evidence of metastatic disease in the abdomen or pelvis. 2.  Probable hepatic steatosis. 3.  Infrarenal aortic aneurysm. 4.  Small bilateral inguinal hernias contain fat.  Original Report Authenticated By: Reyes Ivan, M.D.    Impression:  The patient has a history of stage IV non-small cell lung cancer with multiple brain metastases. To date, her treated disease appears to be well controlled. She now has an apparent new primary lung cancer measuring 1.6 cm right lower lung and may benefit from stereotactic body radiotherapy to this site.  Plan:  Today, I reviewed with the patient the findings and workup recently. We talked about the role of stereotactic body radiotherapy in the management of early stage small unresectable lung cancers. We discussed the logistics and deliver of this form of therapy as well as the anticipated acute and late sequelae. The patient  would like to consider proceeding with radiation treatment as discussed and will return to our office later this week to proceed with CT based treatment planning.  _____________________________________  Artist Pais. Kathrynn Running, M.D.

## 2012-01-09 NOTE — Addendum Note (Signed)
Encounter addended by: Delynn Flavin, RN on: 01/09/2012  7:23 PM<BR>     Documentation filed: Visit Diagnoses, Charges VN

## 2012-01-09 NOTE — Addendum Note (Signed)
Encounter addended by: Delynn Flavin, RN on: 01/09/2012  7:24 PM<BR>     Documentation filed: Charges VN

## 2012-01-12 ENCOUNTER — Encounter: Payer: Self-pay | Admitting: Radiation Oncology

## 2012-01-12 DIAGNOSIS — C7931 Secondary malignant neoplasm of brain: Secondary | ICD-10-CM | POA: Insufficient documentation

## 2012-01-12 DIAGNOSIS — C7949 Secondary malignant neoplasm of other parts of nervous system: Secondary | ICD-10-CM | POA: Insufficient documentation

## 2012-01-12 DIAGNOSIS — C3490 Malignant neoplasm of unspecified part of unspecified bronchus or lung: Secondary | ICD-10-CM | POA: Insufficient documentation

## 2012-01-12 NOTE — Progress Notes (Signed)
  Radiation Oncology         (336) [phone] ________________________________  Name: Courtney West MRN: [id number]  Date: 01/08/2012  DOB: [birth year]  SIMULATION AND TREATMENT PLANNING NOTE  DIAGNOSIS:  61 year old woman with multiple brain metastases from metastatic non-small cell lung cancer and a new 1.6 cm metachronous clinical stage IA primary cancer of the right lower lung  NARRATIVE:  The patient was brought to the CT Simulation planning suite.  Identity was confirmed.  All relevant records and images related to the planned course of therapy were reviewed.  The patient freely provided informed written consent to proceed with treatment after reviewing the details related to the planned course of therapy. The consent form was witnessed and verified by the simulation staff.  Then, the patient was set-up in a stable reproducible  supine position for radiation therapy.  CT images were obtained.  Surface markings were placed.  The CT images were loaded into the planning software.  Then the target and avoidance structures were contoured.  Treatment planning then occurred.  The radiation prescription was entered and confirmed.  A total of 2 complex treatment devices were fabricated. These included a body fix pillow as well as and Accu form head holder. I have requested : 3D Simulation  I have requested a DVH of the following structures: Target, heart, chest wall, spinal cord, esophagus, great vessels, major airways and uninvolved lung.    PLAN:  The patient will receive 54 Gy in 3 fractions.  ________________________________  Artist Pais Kathrynn Running, M.D.

## 2012-01-14 ENCOUNTER — Telehealth: Payer: Self-pay

## 2012-01-16 ENCOUNTER — Ambulatory Visit
Admission: RE | Admit: 2012-01-16 | Discharge: 2012-01-16 | Disposition: A | Discharge status: Home / Self Care | Payer: Medicare Other | Source: Ambulatory Visit | Attending: Radiation Oncology | Admitting: Radiation Oncology

## 2012-01-16 DIAGNOSIS — C7931 Secondary malignant neoplasm of brain: Secondary | ICD-10-CM

## 2012-01-16 MED ORDER — GADOBENATE DIMEGLUMINE 529 MG/ML IV SOLN
17.0000 mL | Freq: Once | INTRAVENOUS | Status: AC | PRN
  Administered 2012-01-16: 17 mL via INTRAVENOUS

## 2012-01-19 ENCOUNTER — Ambulatory Visit
Admission: RE | Admit: 2012-01-19 | Discharge: 2012-01-19 | Disposition: A | Discharge status: Home / Self Care | Payer: Medicare Other | Source: Ambulatory Visit | Attending: Radiation Oncology | Admitting: Radiation Oncology

## 2012-01-19 ENCOUNTER — Other Ambulatory Visit: Payer: Self-pay | Admitting: *Deleted

## 2012-01-19 ENCOUNTER — Encounter: Payer: Self-pay | Admitting: Radiation Oncology

## 2012-01-19 DIAGNOSIS — C7931 Secondary malignant neoplasm of brain: Secondary | ICD-10-CM

## 2012-01-19 DIAGNOSIS — C3490 Malignant neoplasm of unspecified part of unspecified bronchus or lung: Secondary | ICD-10-CM

## 2012-01-19 NOTE — Progress Notes (Signed)
  Radiation Oncology         [phone] ________________________________  Name: Courtney West MRN: [id number]  Date: 01/19/2012  DOB: [birth year]  Stereotactic Body Radiotherapy Treatment Procedure Note  NARRATIVE:  Yitta P Salois was brought to the stereotactic radiation treatment machine and placed supine on the CT couch. The patient was set up for stereotactic body radiotherapy on the body fix pillow.  3D TREATMENT PLANNING AND DOSIMETRY:  The patient's radiation plan was reviewed and approved prior to starting treatment.  It showed 3-dimensional radiation distributions overlaid onto the planning CT.  The [facility] for the target structures as well as the organs at risk were reviewed. The documentation of this is filed in the radiation oncology EMR.  SIMULATION VERIFICATION:  The patient underwent CT imaging on the treatment unit.  These were carefully aligned to document that the ablative radiation dose would cover the target volume and maximally spare the nearby organs at risk according to the planned distribution.  SPECIAL TREATMENT PROCEDURE: Tiffeny P Zeien received high dose ablative stereotactic body radiotherapy to the planned target volume without unforeseen complications. Treatment was delivered uneventfully. The high doses associated with stereotactic body radiotherapy and the significant potential risks require careful treatment set up and patient monitoring constituting a special treatment procedure   STEREOTACTIC TREATMENT MANAGEMENT:  Following delivery, the patient was evaluated clinically. The patient tolerated treatment without significant acute effects, and was discharged to home in stable condition.    PLAN: Continue treatment as planned.  ________________________________  Artist Pais. Kathrynn Running, M.D.

## 2012-01-20 ENCOUNTER — Inpatient Hospital Stay: Admission: RE | Admit: 2012-01-20 | Payer: Self-pay | Source: Ambulatory Visit | Admitting: Radiation Oncology

## 2012-01-21 ENCOUNTER — Encounter: Payer: Self-pay | Admitting: Radiation Oncology

## 2012-01-21 ENCOUNTER — Ambulatory Visit
Admission: RE | Admit: 2012-01-21 | Discharge: 2012-01-21 | Disposition: A | Discharge status: Home / Self Care | Payer: Medicare Other | Source: Ambulatory Visit | Attending: Radiation Oncology | Admitting: Radiation Oncology

## 2012-01-21 DIAGNOSIS — C3490 Malignant neoplasm of unspecified part of unspecified bronchus or lung: Secondary | ICD-10-CM

## 2012-01-21 NOTE — Progress Notes (Signed)
  Radiation Oncology         [phone]) [phone] ________________________________  Name: Courtney West MRN: [id number]  Date: 01/21/2012  DOB: 1951/06/06   Simulation verification note  The patient underwent film verification for the patient's set-up in preparation for stereotactic body radiosurgery. The patient was placed on the treatment unit and a CT scan was performed. These images were then fused with the patient's planning CT scan. The fusion was carefully reviewed in terms of the patient's anatomy as it related to the planning CT scan. The target structures as well as the organs at risk were evaluated on the patient's treatment CT scan. The target and the normal structures were appropriately aligned for treatment. Therefore the patient proceeded with the fraction of stereotactic body radiosurgery.  Fraction: 2  Dose:  36 Gy   ________________________________  Radene Gunning, MD, PhD

## 2012-01-26 ENCOUNTER — Ambulatory Visit
Admission: RE | Admit: 2012-01-26 | Discharge: 2012-01-26 | Disposition: A | Discharge status: Home / Self Care | Payer: Medicare Other | Source: Ambulatory Visit | Attending: Radiation Oncology | Admitting: Radiation Oncology

## 2012-01-26 ENCOUNTER — Encounter: Payer: Self-pay | Admitting: Radiation Oncology

## 2012-01-26 VITALS — BP 121/82 | HR 80 | Temp 98.0°F | Resp 20 | Wt 181.1 lb

## 2012-01-26 DIAGNOSIS — C3490 Malignant neoplasm of unspecified part of unspecified bronchus or lung: Secondary | ICD-10-CM

## 2012-01-26 NOTE — Progress Notes (Signed)
Radiation Oncology [phone]  ________________________________  Name: Courtney West MRN: [id number]  Date: 01/26/2012 DOB: 10/20/1950  Stereotactic Body Radiotherapy Treatment Procedure Note  Site/dose: She received her third fraction of 1800 cGy for a [place] dose 5400 cGy in 3 sessions. NARRATIVE: [name] was brought to the stereotactic radiation treatment machine and placed supine on the CT couch. The patient was set up for stereotactic body radiotherapy on the body fix pillow.  3D TREATMENT PLANNING AND DOSIMETRY: The patient's radiation plan was reviewed and approved prior to starting treatment. It showed 3-dimensional radiation distributions overlaid onto the planning CT. The [facility] for the target structures as well as the organs at risk were reviewed. The documentation of this is filed in the radiation oncology EMR.  SIMULATION VERIFICATION: The patient underwent CT imaging on the treatment unit. These were carefully aligned to document that the ablative radiation dose would cover the target volume and maximally spare the nearby organs at risk according to the planned distribution.  SPECIAL TREATMENT PROCEDURE: [name] received high dose ablative stereotactic body radiotherapy to the planned target volume without unforeseen complications. Treatment was delivered uneventfully. The high doses associated with stereotactic body radiotherapy and the significant potential risks require careful treatment set up and patient monitoring constituting a special treatment procedure  STEREOTACTIC TREATMENT MANAGEMENT: Following delivery, the patient was evaluated clinically. The patient tolerated treatment without significant acute effects, and was discharged to home in stable condition.  PLAN: Followup appointment made with Dr. Kathrynn Running on 02/26/2012.

## 2012-01-26 NOTE — Progress Notes (Signed)
Patient alert,oriented x3, steady gait, no nausea, cough up white pheglm only last night x1, eating well, no c/o pain , drinking diet coke mostly, water rare Completed 3/3 right lower lung 1:50 PM

## 2012-01-26 NOTE — Progress Notes (Signed)
   Weekly Management Note Completed Radiotherapy. Total Dose: 54Gy in 3 fractions  Narrative:  The patient presents for routine under treatment assessment on last day of radiotherapy.  CBCT/MVCT images/Port film x-rays were reviewed.  The chart was checked. She denies any acute effects from SBRT to her right lower lung.  Physical Findings:  weight is 181 lb 1.6 oz (82.146 kg). Her oral temperature is 98 F (36.7 C). Her blood pressure is 121/82 and her pulse is 80. Her respiration is 20.  slightly decreased breath sounds in the right lung base, otherwise decent air flow bilaterally.  Impression:  The patient has tolerated radiotherapy.  Plan:  Routine follow-up in one month. ________________________________   Lonie Peak, M.D.

## 2012-01-27 NOTE — Progress Notes (Signed)
  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 The patient was set up with a body immobilization device pillow and abdominal compression. Her treatments were each supervised by a physician to verify precision.  Narrative: The patient tolerated radiation treatment relatively well.   She did not experience any acute toxicities related to her radiation therapy.  Plan: The patient has completed radiation treatment. The patient will return to radiation oncology clinic for routine followup in one month. I advised them to call or return sooner if they have any questions or concerns related to their recovery or treatment. ________________________________  Artist Pais. Kathrynn Running, M.D.

## 2012-02-17 NOTE — Telephone Encounter (Signed)
e

## 2012-02-20 ENCOUNTER — Encounter: Payer: Self-pay | Admitting: Radiation Oncology

## 2012-02-20 DIAGNOSIS — I639 Cerebral infarction, unspecified: Secondary | ICD-10-CM | POA: Insufficient documentation

## 2012-02-20 DIAGNOSIS — J449 Chronic obstructive pulmonary disease, unspecified: Secondary | ICD-10-CM | POA: Insufficient documentation

## 2012-02-20 DIAGNOSIS — Z923 Personal history of irradiation: Secondary | ICD-10-CM | POA: Insufficient documentation

## 2012-02-26 ENCOUNTER — Ambulatory Visit
Admission: RE | Admit: 2012-02-26 | Discharge: 2012-02-26 | Disposition: A | Discharge status: Home / Self Care | Payer: Medicare Other | Source: Ambulatory Visit | Attending: Radiation Oncology | Admitting: Radiation Oncology

## 2012-02-26 ENCOUNTER — Encounter: Payer: Self-pay | Admitting: Radiation Oncology

## 2012-02-26 VITALS — BP 124/81 | HR 88 | Temp 98.0°F | Resp 20 | Wt 182.3 lb

## 2012-02-26 DIAGNOSIS — C719 Malignant neoplasm of brain, unspecified: Secondary | ICD-10-CM

## 2012-02-26 DIAGNOSIS — C3490 Malignant neoplasm of unspecified part of unspecified bronchus or lung: Secondary | ICD-10-CM

## 2012-02-26 MED ORDER — HYDROCODONE-ACETAMINOPHEN 5-325 MG PO TABS: 1.0000 | ORAL_TABLET | Freq: Four times a day (QID) | ORAL | Status: DC | PRN

## 2012-02-26 NOTE — Progress Notes (Addendum)
Pt reports daily "throbbing headache on top of head"; Hydrocodone prn w/full relief. Pt states she has constant dull pain in right upper back "where I got radiation". Occasional prod cough w/clear phlegm, sob w/exertion. Reports feeling lightheaded x 3; did not pass out. Denies loss of appetite, slight fatigue.

## 2012-02-26 NOTE — Progress Notes (Signed)
  Radiation Oncology         [phone] ________________________________  Name: Courtney West MRN: [id number]  Date: 02/26/2012  DOB: July 10, 1950  Follow-Up Visit Note  CC: [name], MD  Ines Bloomer, MD  Diagnosis:   61 yo woman with stage IV metastatic lung cancer with brain metastases and a new primary stage I lung cancer.  Interval Since Last Radiation:  one months  Narrative:  The patient returns today for routine follow-up.  She is essentially without complaint except occasional headaches which respond to hydrocodone.                              ALLERGIES:  is allergic to carboplatin; codeine; penicillins; sulfa antibiotics; and dilaudid.  Meds: Current Outpatient Prescriptions  Medication Sig Dispense Refill  . amitriptyline (ELAVIL) 100 MG tablet Take 200 mg by mouth at bedtime.        . budesonide-formoterol (SYMBICORT) 80-4.5 MCG/ACT inhaler Inhale 2 puffs into the lungs 2 (two) times daily.        Marland Kitchen diltiazem (CARDIZEM CD) 120 MG 24 hr capsule       . folic acid (FOLVITE) 1 MG tablet TAKE ONE TABLET BY MOUTH EVERY DAY  30 tablet  2  . HYDROcodone-acetaminophen (NORCO) 5-325 MG per tablet Take 1-2 tablets by mouth every 6 (six) hours as needed for pain. pain  60 tablet  1  . LORazepam (ATIVAN) 1 MG tablet Take 1 mg by mouth as needed. Take 1tab po prior to rad tx or mri      . prochlorperazine (COMPAZINE) 10 MG tablet Take 10 mg by mouth every 6 (six) hours as needed. For nausea        Physical Findings: The patient is in no acute distress. Patient is alert and oriented.  weight is 182 lb 4.8 oz (82.691 kg). Her oral temperature is 98 F (36.7 C). Her blood pressure is 124/81 and her pulse is 88. Her respiration is 20 and oxygen saturation is 95%. .  No significant changes.  Impression:  The patient is recovering from the effects of radiation, which were limited.  Plan:  She'll have body CTs on 10/7, then see Dr. Arbutus Ped on 10/9.  She have a brain MRI on  10/31 and see me again a few days later.  If her enlarging treated right frontal brain met continues to show growth, we may need to consider PET or possible surgical resection.  _____________________________________  Artist Pais Kathrynn Running, M.D.

## 2012-03-23 ENCOUNTER — Telehealth: Payer: Self-pay | Admitting: *Deleted

## 2012-03-23 NOTE — Telephone Encounter (Signed)
Spoke w/pt to FU w/conversation she had w/S Boyles earlier today. Pt states "2-3 days ago she jammed her right thumb and that's when her back pain began. " She is unsure "if the thumb/arm pain caused her back to hurt or her back was hurting before". Pt states she is taking Hydrocodone 5-325 mg 2 tabs q 4hrs and getting little relief. She states the pain "is throbbing and just below her right shoulder blade". Pt stated she was unsure if another pain med would be helpful, stated she "didn't know what would help but maybe the doctor would". Reminded pt dr is out of office but will be in office tomorrow until 1:30 pm. Informed pt will route this information to dr in morning and call her back. Pt verbalized understanding and agreement w/above plan.

## 2012-03-25 ENCOUNTER — Ambulatory Visit
Admission: RE | Admit: 2012-03-25 | Discharge: 2012-03-25 | Disposition: A | Discharge status: Home / Self Care | Payer: Medicare Other | Source: Ambulatory Visit | Attending: Radiation Oncology | Admitting: Radiation Oncology

## 2012-03-25 ENCOUNTER — Encounter: Payer: Self-pay | Admitting: Radiation Oncology

## 2012-03-25 VITALS — BP 99/65 | HR 79 | Temp 98.8°F | Resp 20 | Wt 180.8 lb

## 2012-03-25 DIAGNOSIS — C3490 Malignant neoplasm of unspecified part of unspecified bronchus or lung: Secondary | ICD-10-CM

## 2012-03-25 MED ORDER — GABAPENTIN 300 MG PO CAPS: 300.0000 mg | ORAL_CAPSULE | Freq: Three times a day (TID) | ORAL | Status: DC

## 2012-03-25 MED ORDER — OXYCODONE-ACETAMINOPHEN 5-325 MG PO TABS: 1.0000 | ORAL_TABLET | ORAL | Status: DC | PRN

## 2012-03-25 NOTE — Progress Notes (Signed)
Pt c/o pain 10/10 in her left low back and under right shoulder blade. She last took Hydrocodone @ 8am today. She has been taking Hydrocodone 2 tabs q4hrs x 3-4 days w/little relief. She states she has had these pains x 3-4 days.  Pt reports dry cough, sob w/exertion, fatigue, no loss of appetite.  Pt continues to smoke 1 PPD.

## 2012-03-25 NOTE — Progress Notes (Signed)
  Radiation Oncology         [phone] ________________________________  Name: Courtney West MRN: [id number]  Date: 03/25/2012  DOB: [birth year]  Follow-Up Visit Note  CC: [name], MD  Ines Bloomer, MD  Diagnosis:   61 yo woman with stage IV metastatic lung cancer with brain metastases and a new primary stage I lung cancer.  Interval Since Last Radiation:  2 months  Narrative:  The patient returns today complaining is pain. She has had chronic lumbar pain following fracture but now describes some right sided upper back pain in the area of the shoulder blade.                   ALLERGIES:  is allergic to carboplatin; codeine; penicillins; sulfa antibiotics; and dilaudid.  Meds: Current Outpatient Prescriptions  Medication Sig Dispense Refill  . amitriptyline (ELAVIL) 100 MG tablet Take 200 mg by mouth at bedtime.        . budesonide-formoterol (SYMBICORT) 80-4.5 MCG/ACT inhaler Inhale 2 puffs into the lungs 2 (two) times daily.        Marland Kitchen diltiazem (CARDIZEM CD) 120 MG 24 hr capsule       . HYDROcodone-acetaminophen (NORCO/VICODIN) 5-325 MG per tablet Take 1-2 tablets by mouth every 6 (six) hours as needed for pain. pain  60 tablet  5  . LORazepam (ATIVAN) 1 MG tablet Take 1 mg by mouth as needed. Take 1tab po prior to rad tx or mri      . prochlorperazine (COMPAZINE) 10 MG tablet Take 10 mg by mouth every 6 (six) hours as needed. For nausea      . gabapentin (NEURONTIN) 300 MG capsule Take 1 capsule (300 mg total) by mouth 3 (three) times daily.  90 capsule  5  . oxyCODONE-acetaminophen (PERCOCET/ROXICET) 5-325 MG per tablet Take 1-2 tablets by mouth every 4 (four) hours as needed for pain.  120 tablet  0    Physical Findings: The patient is in no acute distress. Patient is alert and oriented.  weight is 180 lb 12.8 oz (82.01 kg). Her oral temperature is 98.8 F (37.1 C). Her blood pressure is 99/65 and her pulse is 79. Her respiration is 20. Marland Kitchen  Respiratory effort is  unremarkable. Musculoskeletal exam reveals no areas of focal bony tenderness. The patient does localized pain in her right posterior chest wall inferior to the scapula tip correlating with his site of her recent stereotactic body radiotherapy. She also has mid lumbar pain correlating with her compression fracture site. No significant changes.  Impression:  The patient is recovering from the effects of radiation.  The patient does have some chest wall pain which may be related to her course of stereotactic body radiotherapy.  It is no longer responding well to hydrocodone.  Plan:  Today, given patient prescription for oxycodone/APAP 5/325 as well as a prescription for Neurontin 300 mg by mouth 3 times a day. She'll continue with routine followup and return if her pain symptoms are not relieved  _____________________________________  Artist Pais. Kathrynn Running, M.D.

## 2012-03-29 ENCOUNTER — Other Ambulatory Visit (HOSPITAL_BASED_OUTPATIENT_CLINIC_OR_DEPARTMENT_OTHER): Payer: Medicare Other | Admitting: Lab

## 2012-03-29 ENCOUNTER — Ambulatory Visit (HOSPITAL_COMMUNITY)
Admission: RE | Admit: 2012-03-29 | Discharge: 2012-03-29 | Disposition: A | Discharge status: Home / Self Care | Payer: Medicare Other | Source: Ambulatory Visit | Attending: Internal Medicine | Admitting: Internal Medicine

## 2012-03-29 DIAGNOSIS — C3410 Malignant neoplasm of upper lobe, unspecified bronchus or lung: Secondary | ICD-10-CM

## 2012-03-29 DIAGNOSIS — R911 Solitary pulmonary nodule: Secondary | ICD-10-CM | POA: Insufficient documentation

## 2012-03-29 DIAGNOSIS — I714 Abdominal aortic aneurysm, without rupture, unspecified: Secondary | ICD-10-CM | POA: Insufficient documentation

## 2012-03-29 DIAGNOSIS — X58XXXA Exposure to other specified factors, initial encounter: Secondary | ICD-10-CM | POA: Insufficient documentation

## 2012-03-29 DIAGNOSIS — C3490 Malignant neoplasm of unspecified part of unspecified bronchus or lung: Secondary | ICD-10-CM | POA: Insufficient documentation

## 2012-03-29 DIAGNOSIS — S32009A Unspecified fracture of unspecified lumbar vertebra, initial encounter for closed fracture: Secondary | ICD-10-CM | POA: Insufficient documentation

## 2012-03-29 DIAGNOSIS — I2510 Atherosclerotic heart disease of native coronary artery without angina pectoris: Secondary | ICD-10-CM | POA: Insufficient documentation

## 2012-03-29 DIAGNOSIS — J438 Other emphysema: Secondary | ICD-10-CM | POA: Insufficient documentation

## 2012-03-29 LAB — CBC WITH DIFFERENTIAL/PLATELET
BASO%: 0.8 % (ref 0.0–2.0)
EOS%: 2.1 % (ref 0.0–7.0)
HCT: 43.6 % (ref 34.8–46.6)
LYMPH%: 25.6 % (ref 14.0–49.7)
MCH: 32.2 pg (ref 25.1–34.0)
MCHC: 34.5 g/dL (ref 31.5–36.0)
MONO#: 0.7 10*3/uL (ref 0.1–0.9)
NEUT%: 64.2 % (ref 38.4–76.8)
Platelets: 205 10*3/uL (ref 145–400)

## 2012-03-29 LAB — COMPREHENSIVE METABOLIC PANEL (CC13)
ALT: 16 U/L (ref 0–55)
CO2: 27 mEq/L (ref 22–29)
Creatinine: 0.8 mg/dL (ref 0.6–1.1)
Total Bilirubin: 0.2 mg/dL (ref 0.20–1.20)

## 2012-03-29 MED ORDER — IOHEXOL 300 MG/ML  SOLN
100.0000 mL | Freq: Once | INTRAMUSCULAR | Status: AC | PRN
  Administered 2012-03-29: 100 mL via INTRAVENOUS

## 2012-03-30 ENCOUNTER — Other Ambulatory Visit: Payer: Self-pay | Admitting: Medical Oncology

## 2012-03-30 ENCOUNTER — Ambulatory Visit (HOSPITAL_BASED_OUTPATIENT_CLINIC_OR_DEPARTMENT_OTHER): Payer: Medicare Other | Admitting: Internal Medicine

## 2012-03-30 ENCOUNTER — Encounter: Payer: Self-pay | Admitting: Internal Medicine

## 2012-03-30 ENCOUNTER — Telehealth: Payer: Self-pay | Admitting: Internal Medicine

## 2012-03-30 VITALS — BP 122/80 | HR 83 | Temp 98.6°F | Resp 20 | Wt 181.3 lb

## 2012-03-30 DIAGNOSIS — T82898A Other specified complication of vascular prosthetic devices, implants and grafts, initial encounter: Secondary | ICD-10-CM

## 2012-03-30 DIAGNOSIS — I8290 Acute embolism and thrombosis of unspecified vein: Secondary | ICD-10-CM

## 2012-03-30 DIAGNOSIS — R911 Solitary pulmonary nodule: Secondary | ICD-10-CM

## 2012-03-30 DIAGNOSIS — C3490 Malignant neoplasm of unspecified part of unspecified bronchus or lung: Secondary | ICD-10-CM

## 2012-03-30 DIAGNOSIS — C7931 Secondary malignant neoplasm of brain: Secondary | ICD-10-CM

## 2012-03-30 DIAGNOSIS — I10 Essential (primary) hypertension: Secondary | ICD-10-CM

## 2012-03-30 DIAGNOSIS — C3410 Malignant neoplasm of upper lobe, unspecified bronchus or lung: Secondary | ICD-10-CM

## 2012-03-30 HISTORY — DX: Other specified complication of vascular prosthetic devices, implants and grafts, initial encounter: T82.898A

## 2012-03-30 MED ORDER — FONDAPARINUX SODIUM 7.5 MG/0.6ML ~~LOC~~ SOLN
7.5000 mg | Freq: Once | SUBCUTANEOUS | Status: AC
  Administered 2012-03-30: 7.5 mg via SUBCUTANEOUS
  Filled 2012-03-30: qty 0.6

## 2012-03-30 MED ORDER — FONDAPARINUX SODIUM 7.5 MG/0.6ML ~~LOC~~ SOLN: 7.5000 mg | SUBCUTANEOUS | Status: DC

## 2012-03-30 MED ORDER — WARFARIN SODIUM 5 MG PO TABS: 5.0000 mg | ORAL_TABLET | Freq: Every day | ORAL | Status: DC

## 2012-03-30 NOTE — Progress Notes (Signed)
Erroneous encounter

## 2012-03-30 NOTE — Telephone Encounter (Addendum)
I called in coumadin rx to pts pharmacy and pt notified-pharmacy received rx for arixtra # 6

## 2012-03-30 NOTE — Patient Instructions (Addendum)
Fondaparinux Injections Fondaparinux (Arixtra) injection is a blood thinner (anticoagulant) medication that "thins" the blood and helps to prevent blood clots from developing in your veins. If blood clots develop and are left untreated, they can travel to your heart, lungs, or brain. These clots can cause serious illness and can be fatal. Blood clots can form due to:  Prolonged immobility such as:  People who cannot get out of bed (bedridden).  Sitting for long periods of time, such as long airplane flights.  Surgery, especially operations that involve:  Orthopedic (bones and joints).  The abdomen.  Obesity.  Certain heart conditions.  Certain cancers.  Hormone Replacement Therapy (HRT) or birth control pills (uncommon but possible). WHAT IF A BLOOD CLOT HAS ALREADY DEVELOPED? If a blood clot has developed, caregivers can use fondaparinux to treat clots in the legs or lungs. In addition to fondaparinux, another blood thinner called warfarin (Coumadin) will be started 2 to 3 days after fondaparinux has been started. warfarin is a pill and must be taken simultaneously with fondaparinux until the warfarin has begun to work.  Your caregiver may use a blood test called an INR or International Normalization Ratio to know when there is enough warfarin in the blood. Fondaparinux may be stopped when the INR level is between 2.0 to 3.0. This means that your blood is at the necessary and best level to treat the clots that have formed.  OTHER FONDAPARINUX USES In [place], fondaparinux is sometimes used to help when there is not enough blood flow to the heart. HOME CARE INSTRUCTIONS   Fondaparinux should not be used if you have allergies to the medication, heparin or pork products.  Before giving your medication, make sure the solution is a clear and colorless. If your medication becomes discolored or has particles in the bottle, do not use it. Notify your caregiver right away.  Keep your  medication safely stored at room temperatures.  You will be instructed by your caregiver how to give fondaparinux injections. HOW TO INJECT FONDAPARINUX 1. Fondaparinux is a shot that is given in the stomach (abdomen). Change (rotate) the injection site each time you give yourself a shot. 2. Twist the plunger cap and remove it. 3. Hold the syringe with either hand and use your other hand to twist the rigid needle guard (covers the needle) counter-clockwise. Pull the rigid needle guard straight off the needle. Discard the needle guard. 4. When using the pre-filled syringes, do not expel the air bubble from the syringe before the injection. The air bubble helps to inject all of the medication when it is given. 5. The injection will be given just underneath the skin into the fat of the belly (subcutaneously). The shots should be injected around the outside of the belly (abdominal wall). Change the place where you give the shot each time. The whole length of the needle should be introduced into a skin fold held between the thumb and forefinger; the skin fold should be held throughout the injection. 6. Inject fondaparinux by pushing the plunger to the bottom of the syringe. Push the plunger rod firmly with your thumb as far as it will go. This will ensure you have injected all of the medication. Do not rub the injection site after completion of the injection. This increases bruising. 7. Remove the syringe from the injection site, keeping your finger on the plunger rod. Be careful not to stick yourself or others. 8. Fondaparinux injection pre-filled syringes and graduated pre-filled syringes are available with   a system that shields the needle after injection. After injection and the syringe is empty, set off the safety system by firmly pushing the plunger rod. The protective sleeve will automatically cover the needle and you can hear a click. The click means your needle is safely covered. 9. Get rid of the  syringe in the nearest needle box (sharps container). FONDAPARINUX WARNINGS Problems with fondaparinux are rare. However, side effects and complications can occur, such as:  Serious side effects can include:  If you are taking fondaparinux or other low-molecular-weight heparins and have an epidural or spinal anesthesia or a spinal tap, you are at risk of developing a blood clot in or around the spine. This condition could result in long-term or permanent paralysis.  A condition called heparin-induced thrombocytopenia (HIT) can occur with fondaparinux use. This is where your platelet count drops. HIT is rare. However, your caregiver will perform lab work to check your blood levels. If you have had this condition before, you should tell your caregiver.  Because fondaparinux thins your blood, complications may include bleeding into the bowel or kidneys.  Mild side effects may include:  Bruising around the injection site.  Mild irritation at the site of injection, such as pain, itching, or redness of skin. SEEK IMMEDIATE MEDICAL CARE IF:  You develop bleeding problems such as:  Vomiting blood or coughing up blood.  Blood in your urine.  Blood in your stool or your stool has a dark, tarry, or coffee ground appearance.  You develop any rashes on your skin, such as:  Multiple red "dots."  Long red streaks.  You have large areas of bruising on your skin.  A sudden nosebleed that does not stop after 15 to 20 minutes.  You have any worsening of the condition that led you to fondaparinux treatment.  You develop chest pain or shortness of breath. If the chest pain or shortness of breath is severe, call your local emergency service immediately! MAKE SURE YOU:   Understand these instructions.  Will watch your condition.  Will get help right away if you are not doing well or get worse. Document Released: 09/05/2008 Document Revised: 09/01/2011 Document Reviewed: 09/05/2008 [employer]  Patient Information 2013 [employer].  

## 2012-03-30 NOTE — Progress Notes (Signed)
[facility] [phone]) [phone]   [phone]  OFFICE PROGRESS NOTE  Courtney Blamer, MD [facility] [address] [place] [address]  DIAGNOSIS: Metastatic non-small cell lung cancer, favoring adenocarcinoma diagnosed in July of 2012, presented with locally advanced disease in the chest as well as brain metastasis.   PRIOR THERAPY:  1. Status post stereotactic radiotherapy to 2 brain lesions under the care of Dr. Mitzi Hansen. 2. Status post a course of concurrent chemoradiation with weekly carboplatin and paclitaxel, last dose of chemotherapy was given 03/05/2011. 3. Systemic chemotherapy with carboplatin for AUC of 5 and Alimta 500 mg/M2. The patient is status post 3 cycles.  CURRENT THERAPY: Observation.  INTERVAL HISTORY: Courtney West 61 y.o. female returns to the clinic today for three-month followup visit. The patient is feeling fine today with no specific complaints. She denied having any significant chest pain but continues to have shortness breath with exertion and no cough or hemoptysis. She denied having any significant weight loss or night sweats. Unfortunately the patient continues to smoke and I strongly encouraged her to quit smoking. She has repeat CT scan of the chest, abdomen and pelvis performed recently and she is here today for evaluation and discussion of her scan results.  MEDICAL HISTORY: Past Medical History  Diagnosis Date  . Hypertension   . COPD (chronic obstructive pulmonary disease)   . Stroke   . Abdominal aortic aneurysm   . Dyslipidemia   . S/P radiation therapy 7/12 thru 9/12, 04/18/11    xrt to brain mets  . Lung cancer 12/2010  . Brain cancer     mets from lung primary  . Allergy     codeine,pcn,sulfa drugs  . Radiation 10/20/2011    frontal mets/Palliation  . History of radiation therapy 01/19/12, 01/21/12, 01/26/12    RLL lung  . History of radiation therapy 04/18/11    single fraction  to 6 brain mets  . Clotted vascular catheter 03/30/2012  . Lung cancer 12/22/2010    ALLERGIES:  is allergic to carboplatin; codeine; penicillins; sulfa antibiotics; and dilaudid.  MEDICATIONS:  Current Outpatient Prescriptions  Medication Sig Dispense Refill  . amitriptyline (ELAVIL) 100 MG tablet Take 200 mg by mouth at bedtime.        . budesonide-formoterol (SYMBICORT) 80-4.5 MCG/ACT inhaler Inhale 2 puffs into the lungs 2 (two) times daily.        Marland Kitchen diltiazem (CARDIZEM CD) 120 MG 24 hr capsule       . gabapentin (NEURONTIN) 300 MG capsule Take 1 capsule (300 mg total) by mouth 3 (three) times daily.  90 capsule  5  . HYDROcodone-acetaminophen (NORCO/VICODIN) 5-325 MG per tablet Take 1-2 tablets by mouth every 6 (six) hours as needed for pain. pain  60 tablet  5  . LORazepam (ATIVAN) 1 MG tablet Take 1 mg by mouth as needed. Take 1tab po prior to rad tx or mri      . oxyCODONE-acetaminophen (PERCOCET/ROXICET) 5-325 MG per tablet Take 1-2 tablets by mouth every 4 (four) hours as needed for pain.  120 tablet  0  . prochlorperazine (COMPAZINE) 10 MG tablet Take 10 mg by mouth every 6 (six) hours as needed. For nausea      . fondaparinux (ARIXTRA) 7.5 MG/0.6ML SOLN Inject 0.6 mLs (7.5 mg total) into the skin daily.  3.6 mL  0    SURGICAL HISTORY:  Past Surgical History  Procedure Date  . Carpal  tunnel release   . Left knee surgery   . Cholecystectomy   . Partial hysterectomy     REVIEW OF SYSTEMS:  A comprehensive review of systems was negative except for: Respiratory: positive for dyspnea on exertion   PHYSICAL EXAMINATION: General appearance: alert, cooperative and no distress Head: Normocephalic, without obvious abnormality, atraumatic Neck: no adenopathy Lymph nodes: Cervical, supraclavicular, and axillary nodes normal. Resp: clear to auscultation bilaterally Cardio: regular rate and rhythm, S1, S2 normal, no murmur, click, rub or gallop GI: soft, non-tender; bowel sounds  normal; no masses,  no organomegaly Extremities: extremities normal, atraumatic, no cyanosis or edema Neurologic: Alert and oriented X 3, normal strength and tone. Normal symmetric reflexes. Normal coordination and gait  ECOG PERFORMANCE STATUS: 1 - Symptomatic but completely ambulatory  Blood pressure 122/80, pulse 83, temperature 98.6 F (37 C), temperature source Oral, resp. rate 20, weight 181 lb 4.8 oz (82.237 kg).  LABORATORY DATA: Lab Results  Component Value Date   WBC 10.0 03/29/2012   HGB 15.0 03/29/2012   HCT 43.6 03/29/2012   MCV 93.4 03/29/2012   PLT 205 03/29/2012      Chemistry      Component Value Date/Time   NA 141 03/29/2012 0805   NA 143 12/24/2011 1311   NA 138 09/06/2011 2145   K 4.2 03/29/2012 0805   K 4.7 12/24/2011 1311   K 4.3 09/06/2011 2145   CL 104 03/29/2012 0805   CL 99 12/24/2011 1311   CL 102 09/06/2011 2145   CO2 27 03/29/2012 0805   CO2 31 12/24/2011 1311   CO2 29 09/06/2011 2145   BUN 11.0 03/29/2012 0805   BUN 13 12/24/2011 1311   BUN 22 09/06/2011 2145   CREATININE 0.8 03/29/2012 0805   CREATININE 1.2 12/24/2011 1311   CREATININE 1.15* 09/06/2011 2145      Component Value Date/Time   CALCIUM 9.6 03/29/2012 0805   CALCIUM 10.2 12/24/2011 1311   CALCIUM 9.6 09/06/2011 2145   ALKPHOS 106 03/29/2012 0805   ALKPHOS 98* 12/24/2011 1311   ALKPHOS 103 07/30/2011 0053   AST 15 03/29/2012 0805   AST 18 12/24/2011 1311   AST 13 07/30/2011 0053   ALT 16 03/29/2012 0805   ALT 12 07/30/2011 0053   BILITOT 0.20 03/29/2012 0805   BILITOT 0.50 12/24/2011 1311   BILITOT 0.1* 07/30/2011 0053       RADIOGRAPHIC STUDIES: Ct Chest W Contrast  03/29/2012  *RADIOLOGY REPORT*  Clinical Data:  History of lung cancer diagnosed in June 2012 status post chemotherapy and radiation therapy, both of which are now complete.  Restaging scan.  CT CHEST, ABDOMEN AND PELVIS WITH CONTRAST  Technique:  Multidetector CT imaging of the chest, abdomen and pelvis was performed following the standard protocol  during bolus administration of intravenous contrast.  Contrast: OMNIPAQUE IOHEXOL 300 MG/ML  SOLN  Comparison:  CT of the chest abdomen and pelvis 12/24/2011.   CT CHEST  Findings:  Mediastinum: Heart size is normal. Trace amount of pericardial fluid and/or thickening, unchanged compared to the prior examination, and unlikely to be of any hemodynamic significance at this time.  No associated pericardial calcification.  There is a right internal jugular single lumen Port-A-Cath with tip terminating in the distal superior vena cava.  Notably, there are some apparent filling defects associated with the distal aspect of the Port-A-Cath catheter, suspicious for catheter associated thrombus (best demonstrated on image 25 of series 2). There is atherosclerosis of the thoracic aorta,  the great vessels of the mediastinum and the coronary arteries, including calcified atherosclerotic plaque in the left main, left anterior descending, left circumflex and right coronary arteries. Esophagus is unremarkable in appearance. No pathologically enlarged mediastinal or hilar lymph nodes.  Lungs/Pleura: In the right lower lobe there is a cluster of pulmonary nodules which has formed into a conglomerate measuring 2.5 x 2.1 cm.  The largest single nodule within this measures 1.6 cm (similar to prior), although the overall bulk of the other pulmonary nodules appears to have increased slightly.  Extensive chronic postradiation changes in the perihilar aspect of the left lung are similar to the prior examination, without definite focal soft tissue mass in this region to suggest local recurrence of disease.  There is a background of moderate centrilobular and paraseptal emphysema.  5 mm subpleural nodule the posterior aspect of the right upper lobe (image 13 of series 4) is unchanged compared to prior examinations, favored to represent a small subpleural lymph node.  No acute consolidative air space disease. No pleural effusions.   Musculoskeletal: There are no aggressive appearing lytic or blastic lesions noted in the visualized portions of the skeleton.  IMPRESSION:  1.  Chronic postradiation changes in the left lung, without evidence of residual or recurrent disease. No evidence of hilar or mediastinal metastasis. 2.  Slight interval enlargement of a cluster of pulmonary nodules in the right lower lobe, suspicious for bronchogenic carcinoma. These have slowly grown compared to PET scan 12/18/2010, and may either represent a metastatic lesion from the original primary in the left lung, or a second primary. 3.  Filling defects associated with the right-sided Port-A-Cath, suspicious for catheter associated thrombus. 4. Atherosclerosis, including left main and three-vessel coronary artery disease.  Assessment for potential risk factor modification, dietary therapy or pharmacologic therapy may be warranted, if clinically indicated. 5.  Moderate centrilobular and paraseptal emphysema.   CT ABDOMEN AND PELVIS  Findings:  Abdomen/Pelvis:  Status post cholecystectomy.  The appearance of the liver, pancreas, bilateral adrenal glands and bilateral kidneys is unremarkable.  Some capsular calcification associated with the spleen is similar to prior examinations, and may reflect remote trauma.  Again noted is a large fusiform infrarenal abdominal aortic aneurysm which has slightly increased in size measuring up to 5.1 x 4.9 cm on today's examination.  There is a large amount of eccentric nonenhancing material within the aneurysm, compatible with a mural thrombus and atheromatous plaque.  Normal appendix. No ascites or pneumoperitoneum and no pathologic distension of bowel.  No definite pathologic lymphadenopathy identified within the abdomen or pelvis.  Status post hysterectomy.  Ovaries are not confidently identified may be surgically absent or atrophic.  The urinary bladder is unremarkable in appearance.  Musculoskeletal: There are no aggressive  appearing lytic or blastic lesions noted in the visualized portions of the skeleton.  Severe chronic compression fracture of L2 with focal kyphotic deformity in the upper lumbar spine, similar to prior examination 12/24/2011.  IMPRESSION:  1.  No evidence to suggest metastatic disease to the abdomen or pelvis. 2.  Slight enlargement of the fusiform infrarenal abdominal aortic aneurysm which measures 5.1 x 4.9 cm on today's examination and exhibits a large amount of eccentric mural thrombus. 3.  Postoperative changes, as above. 4.  Chronic compression fracture of L2 is unchanged.   Original Report Authenticated By: Florencia Reasons, M.D.     ASSESSMENT: This is a very pleasant 61 years old white female with history of metastatic non-small cell lung cancer, adenocarcinoma  status post stereotactic radiotherapy to brain lesions as well as concurrent chemoradiation followed by systemic chemotherapy with carboplatin and Alimta. The patient is doing fine and she has no evidence for disease progression on his recent scan except for slight interval enlargement of a cluster of pulmonary nodules in the right lower lobe that could slowly growing adenocarcinoma versus second primary. She was also found to have filling defect in the right-sided Port-A-Cath suspicious for associated thrombus.  PLAN: I discussed the scan results with the patient and recommended for her continuous observation for now with repeat CT scan of the chest, abdomen and pelvis in 3 months for further evaluation of the right lower lobe pulmonary nodules, but the patient was advised to call immediately if she has any concerning symptoms in the interval that requires earlier evaluation. Regarding the questionable catheter associated thrombus, I started the patient on Arixtra 7.5 mg subcutaneous injection after his dose today at the cancer Center and I called her pharmacy with 6 more doses. The patient was started on Coumadin 5 mg by mouth daily on  03/31/2012 for a total of 3 months and she would be followed by the Coumadin clinic at the [facility].  She was advised to call immediately if she has any concerning symptoms in the interval. The patient also has enlarging infrarenal abdominal aortic aneurysm and she was advised to see her vascular surgeon for evaluation.  All questions were answered. The patient knows to call the clinic with any problems, questions or concerns. We can certainly see the patient much sooner if necessary.

## 2012-03-30 NOTE — Telephone Encounter (Signed)
gv pt appt schedule for October 2013 thru February 2014. Pt aware she will be contacted w/ct appt. [facility] closed.

## 2012-03-31 ENCOUNTER — Other Ambulatory Visit: Payer: Self-pay | Admitting: *Deleted

## 2012-03-31 ENCOUNTER — Ambulatory Visit: Payer: Medicare Other | Admitting: Internal Medicine

## 2012-04-01 ENCOUNTER — Telehealth: Payer: Self-pay | Admitting: Internal Medicine

## 2012-04-01 ENCOUNTER — Other Ambulatory Visit: Payer: Self-pay | Admitting: Internal Medicine

## 2012-04-01 DIAGNOSIS — I82409 Acute embolism and thrombosis of unspecified deep veins of unspecified lower extremity: Secondary | ICD-10-CM

## 2012-04-01 NOTE — Telephone Encounter (Signed)
pt called in to get scan appts,appts made and pt called,pt has contrast and instructs. were given

## 2012-04-01 NOTE — Telephone Encounter (Signed)
Pt has already called back in for her ct and was given a lb/ct appt for 06/28/12 by Thurston Hole. Flush was added for after lb due to pt will be due for flush again around that time. See previous notes from 10/8 and 10/10.

## 2012-04-05 ENCOUNTER — Other Ambulatory Visit (HOSPITAL_BASED_OUTPATIENT_CLINIC_OR_DEPARTMENT_OTHER): Payer: Medicare Other | Admitting: Lab

## 2012-04-05 ENCOUNTER — Ambulatory Visit (HOSPITAL_BASED_OUTPATIENT_CLINIC_OR_DEPARTMENT_OTHER): Payer: Medicare Other | Admitting: Pharmacist

## 2012-04-05 DIAGNOSIS — T82898A Other specified complication of vascular prosthetic devices, implants and grafts, initial encounter: Secondary | ICD-10-CM

## 2012-04-05 DIAGNOSIS — C3490 Malignant neoplasm of unspecified part of unspecified bronchus or lung: Secondary | ICD-10-CM

## 2012-04-05 DIAGNOSIS — I82409 Acute embolism and thrombosis of unspecified deep veins of unspecified lower extremity: Secondary | ICD-10-CM

## 2012-04-05 LAB — PROTIME-INR
INR: 1.4 — ABNORMAL LOW (ref 2.00–3.50)
Protime: 16.8 Seconds — ABNORMAL HIGH (ref 10.6–13.4)

## 2012-04-05 LAB — POCT INR: INR: 1.4

## 2012-04-05 MED ORDER — FONDAPARINUX SODIUM 7.5 MG/0.6ML ~~LOC~~ SOLN: 7.5000 mg | SUBCUTANEOUS | Status: DC

## 2012-04-05 NOTE — Patient Instructions (Addendum)
Increase Coumadin to 5mg  daily except 7.5mg  on MWF. Continue Arixtra 7.5mg  daily. Recheck INR in 1 week on 04/12/12 at 1:00pm for lab and 1:15pm for coumadin clinic.

## 2012-04-05 NOTE — Progress Notes (Signed)
A full discussion of the nature of anticoagulants has been carried out. Pt understands the justification for choosing anticoagulation at this time.  The need for frequent and regular monitoring, precise dosage adjustment and compliance is stressed. Side effects of potential bleeding are discussed.  Signs and symptoms of possible blood clots were discussed. The patient usually "isn't big on greens"; eats broccoli occasionally. I informed her to avoid great swings in general diet.  She does not drink alcohol. She does not use herbal supplements or OTC products. Will increase dose about 20% to Coumadin to 5mg  daily except 7.5mg  on MWF.  Continue Arixtra 7.5mg  daily.  Recheck INR in 1 week on 04/12/12 at 1:00pm for lab and 1:15pm for coumadin clinic.

## 2012-04-06 ENCOUNTER — Telehealth: Payer: Self-pay | Admitting: *Deleted

## 2012-04-06 NOTE — Telephone Encounter (Signed)
Returned vm from pt who states "I need to talk to Dr Kathrynn Running." Informed pt dr is out of office, will return tomorrow. Offered to assist pt, and she stated "I just need to talk to him. It's not a medical problem." This RN attempted several times to assist pt, but pt refused to inform RN of her issue. She insisted she needed to speak w/Dr Kathrynn Running. Informed pt will route her request to Dr Kathrynn Running. She states she may be reached either at [phone] or [phone]. Also routed this to [employer].

## 2012-04-07 ENCOUNTER — Ambulatory Visit
Admission: RE | Admit: 2012-04-07 | Discharge: 2012-04-07 | Disposition: A | Discharge status: Home / Self Care | Payer: Medicare Other | Source: Ambulatory Visit | Attending: Radiation Oncology | Admitting: Radiation Oncology

## 2012-04-07 DIAGNOSIS — C3490 Malignant neoplasm of unspecified part of unspecified bronchus or lung: Secondary | ICD-10-CM

## 2012-04-07 DIAGNOSIS — C719 Malignant neoplasm of brain, unspecified: Secondary | ICD-10-CM

## 2012-04-07 NOTE — Progress Notes (Signed)
  Radiation Oncology         [phone] ________________________________  Name: Courtney West   MRN: [id number]  Date: 04/07/2012  DOB: 1951/01/12  To Whom it May Concern:  I recommended that [name] discontinue working in October 2012 because she developed spread of lung cancer to her brain which could compromise her ability to work.  Her brain metastases have now been treated with stereotactic radiosurgery and now appear to be controlled.  Her brain function appears to be unaffected by the brain metastases.  Accordingly, I have recommended that she return to work on a part-time basis.    If you have questions, please call me.  Sincerely,  Artist Pais. Kathrynn Running, M.D.

## 2012-04-08 ENCOUNTER — Encounter: Payer: Self-pay | Admitting: Radiation Oncology

## 2012-04-08 NOTE — Progress Notes (Signed)
Patient came in to pick up letter

## 2012-04-08 NOTE — Patient Instructions (Signed)
Call if you have any questions.

## 2012-04-12 ENCOUNTER — Other Ambulatory Visit (HOSPITAL_BASED_OUTPATIENT_CLINIC_OR_DEPARTMENT_OTHER): Payer: Medicare Other | Admitting: Lab

## 2012-04-12 ENCOUNTER — Ambulatory Visit (HOSPITAL_BASED_OUTPATIENT_CLINIC_OR_DEPARTMENT_OTHER): Payer: Medicare Other | Admitting: Pharmacist

## 2012-04-12 DIAGNOSIS — I82409 Acute embolism and thrombosis of unspecified deep veins of unspecified lower extremity: Secondary | ICD-10-CM

## 2012-04-12 LAB — POCT INR: INR: 2

## 2012-04-12 LAB — PROTIME-INR: Protime: 24 Seconds — ABNORMAL HIGH (ref 10.6–13.4)

## 2012-04-12 NOTE — Patient Instructions (Signed)
Discontinue Arixtra injections. Continue Coumadin 5mg daily except 7.5mg on MWF.  Recheck INR in 2 weeks on 04/26/12 at 9:00am for lab, 9:15am for coumadin clinic and 10am with Dr. Manning. 

## 2012-04-12 NOTE — Progress Notes (Signed)
Discontinue Arixtra injections. Continue Coumadin 5mg  daily except 7.5mg  on MWF.  Recheck INR in 2 weeks on 04/26/12 at 9:00am for lab, 9:15am for coumadin clinic and 10am with Dr. Kathrynn Running.

## 2012-04-22 ENCOUNTER — Ambulatory Visit
Admission: RE | Admit: 2012-04-22 | Discharge: 2012-04-22 | Disposition: A | Discharge status: Home / Self Care | Payer: Medicare Other | Source: Ambulatory Visit | Attending: Radiation Oncology | Admitting: Radiation Oncology

## 2012-04-22 DIAGNOSIS — C7931 Secondary malignant neoplasm of brain: Secondary | ICD-10-CM

## 2012-04-22 MED ORDER — GADOBENATE DIMEGLUMINE 529 MG/ML IV SOLN
17.0000 mL | Freq: Once | INTRAVENOUS | Status: AC | PRN
  Administered 2012-04-22: 17 mL via INTRAVENOUS

## 2012-04-26 ENCOUNTER — Ambulatory Visit (HOSPITAL_BASED_OUTPATIENT_CLINIC_OR_DEPARTMENT_OTHER): Payer: Medicare Other | Admitting: Pharmacist

## 2012-04-26 ENCOUNTER — Ambulatory Visit
Admission: RE | Admit: 2012-04-26 | Discharge: 2012-04-26 | Disposition: A | Discharge status: Home / Self Care | Payer: Medicare Other | Source: Ambulatory Visit | Attending: Radiation Oncology | Admitting: Radiation Oncology

## 2012-04-26 ENCOUNTER — Other Ambulatory Visit: Payer: Self-pay | Admitting: Radiation Oncology

## 2012-04-26 ENCOUNTER — Other Ambulatory Visit (HOSPITAL_BASED_OUTPATIENT_CLINIC_OR_DEPARTMENT_OTHER): Payer: Medicare Other | Admitting: Lab

## 2012-04-26 VITALS — BP 115/73 | HR 82 | Temp 98.4°F | Resp 20 | Wt 185.7 lb

## 2012-04-26 DIAGNOSIS — I82409 Acute embolism and thrombosis of unspecified deep veins of unspecified lower extremity: Secondary | ICD-10-CM

## 2012-04-26 DIAGNOSIS — C3490 Malignant neoplasm of unspecified part of unspecified bronchus or lung: Secondary | ICD-10-CM

## 2012-04-26 DIAGNOSIS — T66XXXA Radiation sickness, unspecified, initial encounter: Secondary | ICD-10-CM

## 2012-04-26 LAB — POCT INR: INR: 1.4

## 2012-04-26 MED ORDER — PENTOXIFYLLINE ER 400 MG PO TBCR: 400.0000 mg | EXTENDED_RELEASE_TABLET | Freq: Two times a day (BID) | ORAL | Status: DC

## 2012-04-26 NOTE — Addendum Note (Signed)
Encounter addended by: Glennie Hawk, RN on: 04/26/2012  1:47 PM<BR>     Documentation filed: Inpatient Document Flowsheet

## 2012-04-26 NOTE — Progress Notes (Signed)
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  She had an MRI done prior to today's visit and was presented in out neurooncology conference this morning. She is asymptomatic.                              ALLERGIES:  is allergic to carboplatin; codeine; penicillins; sulfa antibiotics; and dilaudid.  Meds: Current Outpatient Prescriptions  Medication Sig Dispense Refill  . amitriptyline (ELAVIL) 100 MG tablet Take 200 mg by mouth at bedtime.        . budesonide-formoterol (SYMBICORT) 80-4.5 MCG/ACT inhaler Inhale 2 puffs into the lungs 2 (two) times daily.       Marland Kitchen diltiazem (CARDIZEM CD) 120 MG 24 hr capsule Take 120 mg by mouth daily.       . fondaparinux (ARIXTRA) 7.5 MG/0.6ML SOLN Inject 0.6 mLs (7.5 mg total) into the skin daily.  10 Syringe  1  . gabapentin (NEURONTIN) 300 MG capsule Take 1 capsule (300 mg total) by mouth 3 (three) times daily.  90 capsule  5  . HYDROcodone-acetaminophen (NORCO/VICODIN) 5-325 MG per tablet Take 1-2 tablets by mouth every 6 (six) hours as needed for pain. pain  60 tablet  5  . LORazepam (ATIVAN) 1 MG tablet Take 1 mg by mouth as needed. Take 1tab po prior to rad tx or mri      . oxyCODONE-acetaminophen (PERCOCET/ROXICET) 5-325 MG per tablet Take 1-2 tablets by mouth every 4 (four) hours as needed for pain.  120 tablet  0  . prochlorperazine (COMPAZINE) 10 MG tablet Take 10 mg by mouth every 6 (six) hours as needed. For nausea      . warfarin (COUMADIN) 5 MG tablet Take 1 tablet (5 mg total) by mouth daily.  30 tablet  0  . pentoxifylline (TRENTAL) 400 MG CR tablet Take 1 tablet (400 mg total) by mouth 2 (two) times daily. 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 Patient is alert and oriented.  weight is 185 lb 11.2 oz (84.233 kg). Her oral temperature is 98.4 F (36.9 C). Her blood pressure is 115/73 and her pulse is 82. Her respiration is 20 and oxygen saturation is 93%. .  No significant changes.  Lab Findings: Lab Results  Component Value Date   WBC 10.0 03/29/2012   HGB 15.0 03/29/2012   HCT 43.6 03/29/2012   MCV 93.4 03/29/2012   PLT 205 03/29/2012    @LASTCHEM @  Radiographic Findings: Ct Chest W Contrast  03/29/2012  *RADIOLOGY REPORT*  Clinical Data:  History of lung cancer diagnosed in June 2012 status post chemotherapy and radiation therapy, both of which are now complete.  Restaging scan.  CT CHEST, ABDOMEN AND PELVIS WITH CONTRAST  Technique:  Multidetector CT imaging of the chest, abdomen and pelvis was performed following the standard protocol during bolus administration of intravenous contrast.  Contrast: OMNIPAQUE IOHEXOL 300 MG/ML  SOLN  Comparison:  CT of the chest abdomen and pelvis 12/24/2011.  CT CHEST  Findings:  Mediastinum: Heart size is normal. Trace amount of pericardial fluid and/or thickening, unchanged compared to the prior examination, and unlikely to be of any hemodynamic significance at this time.  No associated pericardial calcification.  There is a right internal jugular single lumen Port-A-Cath with tip terminating in the distal superior vena cava.  Notably, there are some apparent filling defects associated with the distal aspect of the Port-A-Cath catheter, suspicious for catheter associated thrombus (best demonstrated on image 25 of series 2). There is atherosclerosis of the thoracic aorta, the great vessels of the mediastinum and the coronary arteries, including calcified atherosclerotic plaque in the left main, left anterior descending, left circumflex and right coronary arteries. Esophagus is unremarkable  in appearance. No pathologically enlarged mediastinal or hilar lymph nodes.  Lungs/Pleura: In the right lower lobe there is a cluster of pulmonary nodules which has formed into a conglomerate measuring 2.5 x 2.1 cm.  The largest single nodule within this measures 1.6 cm (similar to prior), although the overall bulk of the other pulmonary nodules appears to have increased slightly.  Extensive chronic postradiation changes in the perihilar aspect of the left lung are similar to the prior examination, without definite focal soft tissue mass in this region to suggest local recurrence of disease.  There is a background of moderate centrilobular and paraseptal emphysema.  5 mm subpleural nodule the posterior aspect of the right upper lobe (image 13 of series 4) is unchanged compared to prior examinations, favored to represent a small subpleural lymph node.  No acute consolidative air space disease. No pleural effusions.  Musculoskeletal: There are no aggressive appearing lytic or blastic lesions noted in the visualized portions of the skeleton.  IMPRESSION:  1.  Chronic postradiation changes in the left lung, without evidence of residual or recurrent disease. No evidence of hilar or mediastinal metastasis. 2.  Slight interval enlargement of a cluster of pulmonary nodules in the right lower lobe, suspicious for bronchogenic carcinoma. These have slowly grown compared to PET scan 12/18/2010, and may either represent a metastatic lesion from the original primary in the left lung, or a second primary. 3.  Filling defects associated with the right-sided Port-A-Cath, suspicious for catheter associated thrombus. 4. Atherosclerosis, including left main and three-vessel coronary artery disease.  Assessment for potential risk factor modification, dietary therapy or pharmacologic therapy may be warranted, if clinically indicated. 5.  Moderate centrilobular and paraseptal emphysema.  CT ABDOMEN AND PELVIS  Findings:  Abdomen/Pelvis:   Status post cholecystectomy.  The appearance of the liver, pancreas, bilateral adrenal glands and bilateral kidneys is unremarkable.  Some capsular calcification associated with the spleen is similar to prior examinations, and may reflect remote trauma.  Again noted is a large fusiform infrarenal abdominal aortic aneurysm which has slightly increased in size measuring up to 5.1 x 4.9 cm on today's examination.  There is a large amount of eccentric nonenhancing material within the aneurysm, compatible with a mural thrombus and atheromatous plaque.  Normal appendix. No ascites or pneumoperitoneum and no pathologic distension of bowel.  No definite pathologic lymphadenopathy identified within the abdomen or pelvis.  Status post hysterectomy.  Ovaries are not confidently identified may be surgically absent or atrophic.  The urinary bladder is unremarkable in appearance.  Musculoskeletal: There are no aggressive appearing lytic or blastic lesions noted in the visualized portions of the skeleton.  Severe chronic compression fracture of L2 with focal kyphotic deformity in the upper lumbar spine, similar to prior examination 12/24/2011.  IMPRESSION:  1.  No evidence to suggest metastatic disease to the abdomen or pelvis. 2.  Slight enlargement of the fusiform infrarenal abdominal aortic aneurysm which measures 5.1 x 4.9 cm on today's examination and exhibits a large amount of eccentric mural thrombus. 3.  Postoperative changes, as above. 4.  Chronic compression fracture of L2 is unchanged.   Original Report Authenticated By: Florencia Reasons, M.D.    Mr [name] Wo Contrast  04/22/2012  *RADIOLOGY REPORT*  Clinical Data: Metastatic lung cancer.  Stereotactic radiosurgery 32-month restaging  MRI HEAD WITHOUT AND WITH CONTRAST  Technique:  Multiplanar, multiecho pulse sequences of the brain and surrounding structures were obtained according to standard protocol without and with intravenous contrast  Contrast: 17mL  MULTIHANCE GADOBENATE DIMEGLUMINE 529 MG/ML IV SOLN  Comparison: MRI 01/16/2012  Findings: Ventricle size is normal.  No acute infarct.  Multiple enhancing metastatic deposits are present as described below.  Left lateral temporal lesion measuring 3 x 4 mm is unchanged.  4.4 x 8.2 mm ring enhancing lesion in the right frontal operculum is unchanged.  Right frontal white matter lesion has enlarged measuring 4.6 mm. Axial image number 91   post contrast.  Left posterior frontal 2.5 mm enhancing lesion is larger.  Axial image number 112.  Previous identified lesions in the right parietal lobe and right superior cerebellum are no longer visualized.  No new lesions are identified.  No intracranial hemorrhages identified.  IMPRESSION: Four enhancing metastatic deposits are present.  Stable right operculum and left temporal lobe lesions.  Mild growth of the lesions in the right frontal lobe and left posterior  frontal lobe.   Original Report Authenticated By: Janeece Riggers, M.D.    Ct Abdomen Pelvis W Contrast  03/29/2012  *RADIOLOGY REPORT*  Clinical Data:  History of lung cancer diagnosed in June 2012 status post chemotherapy and radiation therapy, both of which are now complete.  Restaging scan.  CT CHEST, ABDOMEN AND PELVIS WITH CONTRAST  Technique:  Multidetector CT imaging of the chest, abdomen and pelvis was performed following the standard protocol during bolus administration of intravenous contrast.  Contrast: OMNIPAQUE IOHEXOL 300 MG/ML  SOLN  Comparison:  CT of the chest abdomen and pelvis 12/24/2011.  CT CHEST  Findings:  Mediastinum: Heart size is normal. Trace amount of pericardial fluid and/or thickening, unchanged compared to the prior examination, and unlikely to be of any hemodynamic significance at this time.  No associated pericardial calcification.  There is a right internal jugular single lumen Port-A-Cath with tip terminating in the distal superior vena cava.  Notably, there are some apparent  filling defects associated with the distal aspect of the Port-A-Cath catheter, suspicious for catheter associated thrombus (best demonstrated on image 25 of series 2). There is atherosclerosis of the thoracic aorta, the great vessels of the mediastinum and the coronary arteries, including calcified atherosclerotic plaque in the left main, left anterior descending, left circumflex and right coronary arteries. Esophagus is unremarkable in appearance. No pathologically enlarged mediastinal or hilar lymph nodes.  Lungs/Pleura: In the right lower lobe there is a cluster of pulmonary nodules which has formed into a conglomerate measuring 2.5 x 2.1 cm.  The largest single nodule within this measures 1.6 cm (similar to prior), although the overall bulk of the other pulmonary nodules appears to have increased slightly.  Extensive chronic postradiation changes in the perihilar aspect of the left lung are similar to the prior examination, without definite focal soft tissue mass in this region to suggest  local recurrence of disease.  There is a background of moderate centrilobular and paraseptal emphysema.  5 mm subpleural nodule the posterior aspect of the right upper lobe (image 13 of series 4) is unchanged compared to prior examinations, favored to represent a small subpleural lymph node.  No acute consolidative air space disease. No pleural effusions.  Musculoskeletal: There are no aggressive appearing lytic or blastic lesions noted in the visualized portions of the skeleton.  IMPRESSION:  1.  Chronic postradiation changes in the left lung, without evidence of residual or recurrent disease. No evidence of hilar or mediastinal metastasis. 2.  Slight interval enlargement of a cluster of pulmonary nodules in the right lower lobe, suspicious for bronchogenic carcinoma. These have slowly grown compared to PET scan 12/18/2010, and may either represent a metastatic lesion from the original primary in the left lung, or a second  primary. 3.  Filling defects associated with the right-sided Port-A-Cath, suspicious for catheter associated thrombus. 4. Atherosclerosis, including left main and three-vessel coronary artery disease.  Assessment for potential risk factor modification, dietary therapy or pharmacologic therapy may be warranted, if clinically indicated. 5.  Moderate centrilobular and paraseptal emphysema.  CT ABDOMEN AND PELVIS  Findings:  Abdomen/Pelvis:  Status post cholecystectomy.  The appearance of the liver, pancreas, bilateral adrenal glands and bilateral kidneys is unremarkable.  Some capsular calcification associated with the spleen is similar to prior examinations, and may reflect remote trauma.  Again noted is a large fusiform infrarenal abdominal aortic aneurysm which has slightly increased in size measuring up to 5.1 x 4.9 cm on today's examination.  There is a large amount of eccentric nonenhancing material within the aneurysm, compatible with a mural thrombus and atheromatous plaque.  Normal appendix. No ascites or pneumoperitoneum and no pathologic distension of bowel.  No definite pathologic lymphadenopathy identified within the abdomen or pelvis.  Status post hysterectomy.  Ovaries are not confidently identified may be surgically absent or atrophic.  The urinary bladder is unremarkable in appearance.  Musculoskeletal: There are no aggressive appearing lytic or blastic lesions noted in the visualized portions of the skeleton.  Severe chronic compression fracture of L2 with focal kyphotic deformity in the upper lumbar spine, similar to prior examination 12/24/2011.  IMPRESSION:  1.  No evidence to suggest metastatic disease to the abdomen or pelvis. 2.  Slight enlargement of the fusiform infrarenal abdominal aortic aneurysm which measures 5.1 x 4.9 cm on today's examination and exhibits a large amount of eccentric mural thrombus. 3.  Postoperative changes, as above. 4.  Chronic compression fracture of L2 is unchanged.    Original Report Authenticated By: Florencia Reasons, M.D.     Impression:  The patient has subtle enlargement of 2 of her treated sites suggestive of recurrence versus adverse radiation effect.  Plan:  Start Trental and Vitamin E and re-scan in 3 months.  _____________________________________  Artist Pais. Kathrynn Running, M.D.

## 2012-04-26 NOTE — Progress Notes (Signed)
Pt denies pain today, denies cough, SOB, loss of appetite, HA, nausea. She reports fatigue, still smoking 1 PPD.

## 2012-04-26 NOTE — Progress Notes (Signed)
INR dropped below goal.  Unsure why this has happened.  Pt states that she has not missed any coumadin doses.  Pt stopped taking diltiazem and has started atorvastatin and metoprolol, but these medications should not interact with Coumadin.  Per discussion with Dr. Arbutus Ped, will not start Arixtra and will increase coumadin dose to 7.5mg  daily.  Coumadin 7.5mg  tablet samples given to pt.  Pt will have PT/INR checked at [facility] on 05/03/12, so she does not have to spend money on gas to come back to [facility].  I gave pt a prescription for PT/INR and results will be faxed to [facility] pharmacy.  Coumadin clinic pharmacist will call pt with results and further instructions.

## 2012-05-03 ENCOUNTER — Ambulatory Visit (HOSPITAL_BASED_OUTPATIENT_CLINIC_OR_DEPARTMENT_OTHER): Payer: Medicare Other | Admitting: Lab

## 2012-05-03 ENCOUNTER — Other Ambulatory Visit: Payer: Medicare Other | Admitting: Lab

## 2012-05-03 ENCOUNTER — Other Ambulatory Visit: Payer: Self-pay | Admitting: Radiation Therapy

## 2012-05-03 ENCOUNTER — Telehealth: Payer: Self-pay | Admitting: Internal Medicine

## 2012-05-03 ENCOUNTER — Ambulatory Visit (HOSPITAL_BASED_OUTPATIENT_CLINIC_OR_DEPARTMENT_OTHER): Payer: Medicare Other | Admitting: Pharmacist

## 2012-05-03 DIAGNOSIS — I82409 Acute embolism and thrombosis of unspecified deep veins of unspecified lower extremity: Secondary | ICD-10-CM

## 2012-05-03 DIAGNOSIS — C7949 Secondary malignant neoplasm of other parts of nervous system: Secondary | ICD-10-CM

## 2012-05-03 DIAGNOSIS — Z7901 Long term (current) use of anticoagulants: Secondary | ICD-10-CM

## 2012-05-03 LAB — PROTIME-INR
INR: 2.9 (ref 2.00–3.50)
Protime: 34.8 Seconds — ABNORMAL HIGH (ref 10.6–13.4)

## 2012-05-03 LAB — POCT INR: INR: 2.9

## 2012-05-03 NOTE — Progress Notes (Signed)
No changes to report.  Reviewed pt med list and added Vit E.  Will continue coumadin to 7.5mg  daily.  Check PT/INR on 11/20 with flush appmt. Lab at 3p, cc at 3:15 and flush at 3:30.

## 2012-05-03 NOTE — Patient Instructions (Signed)
Continue coumadin to 7.5mg  daily.  Check PT/INR on 11/20 with flush appmt. Lab at 3p, cc at 3:15 and flush at 3:30

## 2012-05-03 NOTE — Telephone Encounter (Signed)
Pt came in and made a lab/coumadin /flush appt,printed for pt aom

## 2012-05-11 ENCOUNTER — Telehealth: Payer: Self-pay | Admitting: Internal Medicine

## 2012-05-11 ENCOUNTER — Other Ambulatory Visit: Payer: Self-pay | Admitting: *Deleted

## 2012-05-11 DIAGNOSIS — C3490 Malignant neoplasm of unspecified part of unspecified bronchus or lung: Secondary | ICD-10-CM

## 2012-05-11 MED ORDER — LIDOCAINE-PRILOCAINE 2.5-2.5 % EX CREA: TOPICAL_CREAM | CUTANEOUS | Status: DC | PRN

## 2012-05-11 NOTE — Telephone Encounter (Signed)
s/w pt and she is aware of her appt on 11/20    aom

## 2012-05-12 ENCOUNTER — Other Ambulatory Visit (HOSPITAL_BASED_OUTPATIENT_CLINIC_OR_DEPARTMENT_OTHER): Payer: Medicare Other | Admitting: Lab

## 2012-05-12 ENCOUNTER — Ambulatory Visit (HOSPITAL_BASED_OUTPATIENT_CLINIC_OR_DEPARTMENT_OTHER): Payer: Medicare Other | Admitting: Pharmacist

## 2012-05-12 ENCOUNTER — Ambulatory Visit: Payer: Medicare Other

## 2012-05-12 VITALS — BP 118/78 | HR 82 | Temp 98.4°F

## 2012-05-12 DIAGNOSIS — I82409 Acute embolism and thrombosis of unspecified deep veins of unspecified lower extremity: Secondary | ICD-10-CM

## 2012-05-12 DIAGNOSIS — C3490 Malignant neoplasm of unspecified part of unspecified bronchus or lung: Secondary | ICD-10-CM

## 2012-05-12 MED ORDER — SODIUM CHLORIDE 0.9 % IJ SOLN
10.0000 mL | INTRAMUSCULAR | Status: DC | PRN
  Administered 2012-05-12: 10 mL via INTRAVENOUS
  Filled 2012-05-12: qty 10

## 2012-05-12 MED ORDER — HEPARIN SOD (PORK) LOCK FLUSH 100 UNIT/ML IV SOLN
500.0000 [IU] | Freq: Once | INTRAVENOUS | Status: AC
  Administered 2012-05-12: 500 [IU] via INTRAVENOUS
  Filled 2012-05-12: qty 5

## 2012-05-12 NOTE — Progress Notes (Signed)
INR supratherapeutic (3.5) on 7.5mg  daily No complaints.  No missed doses.  No changes in meds or diet. Since INR seems to be continually increasing since changing dose to 7.5mg  daily, will trim dose back slightly to 7.5mg  daily except 3.75mg  on Wednesday and Saturday.   Recheck INR in ~1 week.

## 2012-05-12 NOTE — Patient Instructions (Signed)
Call MD with any questions 

## 2012-05-15 ENCOUNTER — Emergency Department (HOSPITAL_COMMUNITY): Payer: Medicare Other

## 2012-05-15 ENCOUNTER — Encounter (HOSPITAL_COMMUNITY): Payer: Self-pay | Admitting: Emergency Medicine

## 2012-05-15 ENCOUNTER — Emergency Department (HOSPITAL_COMMUNITY)
Admission: EM | Admit: 2012-05-15 | Discharge: 2012-05-16 | Disposition: A | Discharge status: Home / Self Care | Payer: Medicare Other | Attending: Emergency Medicine | Admitting: Emergency Medicine

## 2012-05-15 DIAGNOSIS — T451X5A Adverse effect of antineoplastic and immunosuppressive drugs, initial encounter: Secondary | ICD-10-CM | POA: Insufficient documentation

## 2012-05-15 DIAGNOSIS — J4489 Other specified chronic obstructive pulmonary disease: Secondary | ICD-10-CM | POA: Insufficient documentation

## 2012-05-15 DIAGNOSIS — J449 Chronic obstructive pulmonary disease, unspecified: Secondary | ICD-10-CM | POA: Insufficient documentation

## 2012-05-15 DIAGNOSIS — C3490 Malignant neoplasm of unspecified part of unspecified bronchus or lung: Secondary | ICD-10-CM | POA: Insufficient documentation

## 2012-05-15 DIAGNOSIS — M25519 Pain in unspecified shoulder: Secondary | ICD-10-CM | POA: Insufficient documentation

## 2012-05-15 DIAGNOSIS — I10 Essential (primary) hypertension: Secondary | ICD-10-CM | POA: Insufficient documentation

## 2012-05-15 DIAGNOSIS — Z85841 Personal history of malignant neoplasm of brain: Secondary | ICD-10-CM | POA: Insufficient documentation

## 2012-05-15 DIAGNOSIS — Z8673 Personal history of transient ischemic attack (TIA), and cerebral infarction without residual deficits: Secondary | ICD-10-CM | POA: Insufficient documentation

## 2012-05-15 DIAGNOSIS — M898X1 Other specified disorders of bone, shoulder: Secondary | ICD-10-CM

## 2012-05-15 DIAGNOSIS — Z79899 Other long term (current) drug therapy: Secondary | ICD-10-CM | POA: Insufficient documentation

## 2012-05-15 DIAGNOSIS — F17200 Nicotine dependence, unspecified, uncomplicated: Secondary | ICD-10-CM | POA: Insufficient documentation

## 2012-05-15 DIAGNOSIS — Z923 Personal history of irradiation: Secondary | ICD-10-CM | POA: Insufficient documentation

## 2012-05-15 NOTE — ED Notes (Signed)
Patient complaining of right shoulder pain. States she has cancer and is receiving radiation to the lungs and brain. States "I have a blood clot and I have a port and they flushed it Thursday and I started having severe shoulder pain yesterday." Denies shortness of breath.

## 2012-05-15 NOTE — ED Provider Notes (Signed)
History     CSN: [id number]  Arrival date & time 05/15/12  2149   First MD Initiated Contact with Patient 05/15/12 2248      Chief Complaint  Patient presents with  . Shoulder Pain    (Consider location/radiation/quality/duration/timing/severity/associated sxs/prior treatment) HPI Comments: Pt has B lung CA with mets.  Her R scapula began hurting after  2 recent radiation treatments.  No trauma to the area.  Patient is a 61 y.o. female presenting with shoulder pain. The history is provided by the patient. No language interpreter was used.  Shoulder Pain This is a new problem. Episode onset: several days agho. The problem occurs constantly. The problem has been unchanged. Pertinent negatives include no chills or fever. Exacerbated by: palpation. She has tried oral narcotics for the symptoms. The treatment provided no relief.    Past Medical History  Diagnosis Date  . Hypertension   . COPD (chronic obstructive pulmonary disease)   . Stroke   . Abdominal aortic aneurysm   . Dyslipidemia   . S/P radiation therapy 7/12 thru 9/12, 04/18/11    xrt to brain mets  . Lung cancer 12/2010  . Brain cancer     mets from lung primary  . Allergy     codeine,pcn,sulfa drugs  . Radiation 10/20/2011    frontal mets/Palliation  . History of radiation therapy 01/19/12, 01/21/12, 01/26/12    RLL lung  . History of radiation therapy 04/18/11    single fraction to 6 brain mets  . Clotted vascular catheter 03/30/2012  . Lung cancer 12/22/2010    Past Surgical History  Procedure Date  . Carpal tunnel release   . Left knee surgery   . Cholecystectomy   . Partial hysterectomy     Family History  Problem Relation Age of Onset  . Cancer Father     lung, leukemia  . Cancer Maternal Aunt     kidney    History  Substance Use Topics  . Smoking status: Current Every Day Smoker -- 1.0 packs/day for 50 years    Types: Cigarettes  . Smokeless tobacco: Not on file  . Alcohol Use: No    OB  History    Grav Para Term Preterm Abortions TAB SAB Ect Mult Living                  Review of Systems  Constitutional: Negative for fever and chills.  Musculoskeletal:       Scapula pain     Allergies  Carboplatin; Codeine; Penicillins; Sulfa antibiotics; and Dilaudid  Home Medications   Current Outpatient Rx  Name  Route  Sig  Dispense  Refill  . AMITRIPTYLINE HCL 100 MG PO TABS   Oral   Take 200 mg by mouth at bedtime.           . ATORVASTATIN CALCIUM 20 MG PO TABS   Oral   Take 20 mg by mouth daily.         . BUDESONIDE-FORMOTEROL FUMARATE 80-4.5 MCG/ACT IN AERO   Inhalation   Inhale 2 puffs into the lungs 2 (two) times daily.          Marland Kitchen GABAPENTIN 300 MG PO CAPS   Oral   Take 600 mg by mouth 3 (three) times daily.         Marland Kitchen HYDROCODONE-ACETAMINOPHEN 5-325 MG PO TABS   Oral   Take 1-2 tablets by mouth every 6 (six) hours as needed for pain. pain   60 tablet  5   . LORAZEPAM 1 MG PO TABS   Oral   Take 1 mg by mouth as needed. Take 1tab po prior to rad tx or mri         . METOPROLOL SUCCINATE ER 50 MG PO TB24   Oral   Take 50 mg by mouth daily. Take with or immediately following a meal.         . PENTOXIFYLLINE ER 400 MG PO TBCR   Oral   Take 1 tablet (400 mg total) by mouth 2 (two) times daily. 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 VITAMIN E 400 UNITS PO CAPS   Oral   Take 400 Units by mouth 2 (two) times daily.         . WARFARIN SODIUM 7.5 MG PO TABS   Oral   Take 7.5 mg by mouth daily. Patient takes daily except on Wednesday and Saturday patient takes 1/2 tablet (3.75)         . LIDOCAINE-PRILOCAINE 2.5-2.5 % EX CREA   Topical   Apply topically as needed.   30 g   0   . PROCHLORPERAZINE MALEATE 10 MG PO TABS   Oral   Take 10 mg by mouth every 6 (six) hours as needed. For nausea           BP 105/67  Pulse 103  Temp 98.7 F (37.1 C)  Resp 20  Ht 5' 2.5" (1.588 m)  Wt  180 lb (81.647 kg)  BMI 32.40 kg/m2  SpO2 96%  Physical Exam  Nursing note and vitals reviewed. Constitutional: She is oriented to person, place, and time. She appears well-developed and well-nourished. No distress.  HENT:  Head: Normocephalic and atraumatic.  Eyes: EOM are normal.  Neck: Normal range of motion.  Cardiovascular: Normal rate and regular rhythm.   Pulmonary/Chest: Effort normal.  Abdominal: Soft. She exhibits no distension. There is no tenderness.  Musculoskeletal: Normal range of motion.       Back:  Neurological: She is alert and oriented to person, place, and time.  Skin: Skin is warm and dry.  Psychiatric: She has a normal mood and affect. Judgment normal.    ED Course  Procedures (including critical care time)  Labs Reviewed - No data to display Dg Shoulder Right  05/15/2012  *RADIOLOGY REPORT*  Clinical Data: Lung and brain carcinoma, pain.  RIGHT SHOULDER - 2+ VIEW  Comparison: 03/03/2006  Findings: Interval placement of a right IJ PowerPort, tip in the mid SVC.  No pneumothorax is evident. Negative for fracture, dislocation, or other acute abnormality.  Normal alignment and mineralization. No significant degenerative change.  Regional soft tissues unremarkable.  IMPRESSION:  Negative   Original Report Authenticated By: D. Andria Rhein, MD      1. Pain of right scapula       MDM  F/u with you oncologist        Evalina Field, PA 05/16/12 0018

## 2012-05-15 NOTE — ED Notes (Signed)
Advised pt that edpa would be in to see her shortly.

## 2012-05-16 NOTE — ED Provider Notes (Signed)
Medical screening examination/treatment/procedure(s) were performed by non-physician practitioner and as supervising physician I was immediately available for consultation/collaboration.  Cade Dashner, MD 05/16/12 0824 

## 2012-05-21 ENCOUNTER — Other Ambulatory Visit (HOSPITAL_BASED_OUTPATIENT_CLINIC_OR_DEPARTMENT_OTHER): Payer: Medicare Other | Admitting: Lab

## 2012-05-21 ENCOUNTER — Ambulatory Visit (HOSPITAL_BASED_OUTPATIENT_CLINIC_OR_DEPARTMENT_OTHER): Payer: Medicare Other | Admitting: Pharmacist

## 2012-05-21 DIAGNOSIS — I82409 Acute embolism and thrombosis of unspecified deep veins of unspecified lower extremity: Secondary | ICD-10-CM

## 2012-05-21 LAB — PROTIME-INR

## 2012-05-21 NOTE — Progress Notes (Signed)
INR subtherapeutic (1.5) after decreasing dose to 7.5mg  daily except 3.75mg  on WSa. No changes in meds or diet. No complaints, but is eager to have her visits spaced farther apart.  Will increase dose of Coumadin to 7.5mg  daily except 5 mg on WSa. Recheck INR in 1 week

## 2012-05-28 ENCOUNTER — Other Ambulatory Visit (HOSPITAL_BASED_OUTPATIENT_CLINIC_OR_DEPARTMENT_OTHER): Payer: Medicare Other | Admitting: Lab

## 2012-05-28 ENCOUNTER — Ambulatory Visit: Payer: Medicare Other | Admitting: Pharmacist

## 2012-05-28 DIAGNOSIS — I82409 Acute embolism and thrombosis of unspecified deep veins of unspecified lower extremity: Secondary | ICD-10-CM

## 2012-05-28 LAB — PROTIME-INR
INR: 2.4 (ref 2.00–3.50)
Protime: 28.8 Seconds — ABNORMAL HIGH (ref 10.6–13.4)

## 2012-05-28 LAB — POCT INR: INR: 2.4

## 2012-05-28 NOTE — Progress Notes (Signed)
INR = 2.4 on 7.5 mg/day; 5 mg Wed/Sat. No complaints. No missed doses or recent med changes. INR at goal.  No change to Coumadin dose. I gave pt samples of Coumadin 7.5 mg today.  She will likely need samples of 5 mg Coumadin at next visit in 2 wks. Marily Lente, Pharm.D.

## 2012-06-11 ENCOUNTER — Ambulatory Visit (HOSPITAL_BASED_OUTPATIENT_CLINIC_OR_DEPARTMENT_OTHER): Payer: Medicare Other | Admitting: Pharmacist

## 2012-06-11 ENCOUNTER — Other Ambulatory Visit (HOSPITAL_BASED_OUTPATIENT_CLINIC_OR_DEPARTMENT_OTHER): Payer: Medicare Other | Admitting: Lab

## 2012-06-11 DIAGNOSIS — I82409 Acute embolism and thrombosis of unspecified deep veins of unspecified lower extremity: Secondary | ICD-10-CM

## 2012-06-11 LAB — POCT INR: INR: 2.7

## 2012-06-11 LAB — PROTIME-INR: Protime: 32.4 Seconds — ABNORMAL HIGH (ref 10.6–13.4)

## 2012-06-11 NOTE — Patient Instructions (Signed)
Continue coumadin 7.5mg  daily except 5mg  on Wednesday, Saturday. Recheck INR on 06/28/12 at 1000 for lab and 1015 for clinic.

## 2012-06-11 NOTE — Progress Notes (Signed)
No changes to report. Will continue coumadin 7.5mg  daily except 5mg  on Wednesday, Saturday. Recheck INR on 06/28/12 at 1000 for lab and 1015 for clinic.

## 2012-06-18 ENCOUNTER — Telehealth: Payer: Self-pay | Admitting: Internal Medicine

## 2012-06-18 NOTE — Telephone Encounter (Signed)
called pt and she is aware of her appt change from 1/8 to 1/7    anne

## 2012-06-23 ENCOUNTER — Other Ambulatory Visit: Payer: Self-pay | Admitting: Radiation Oncology

## 2012-06-24 ENCOUNTER — Encounter: Payer: Self-pay | Admitting: *Deleted

## 2012-06-24 ENCOUNTER — Telehealth: Payer: Self-pay | Admitting: *Deleted

## 2012-06-24 NOTE — Telephone Encounter (Signed)
Per Dr Kathrynn Running, Hydrocodone refilled. Called pt and informed her med called in to [facility], [place]. Pt verbalized understanding.

## 2012-06-25 ENCOUNTER — Telehealth: Payer: Self-pay | Admitting: Radiation Oncology

## 2012-06-25 NOTE — Telephone Encounter (Signed)
Phoned patient per Dr. Broadus John order to inform her hydrocodone-acetaminophen script is ready for pick up at the nursing station in radiation oncology. Patient reports that she "already has it." Patient goes on to say, that Corrie Dandy called her already and she has picked it up from [place]. Encouraged patient to call with future needs. Patient verbalized understanding.

## 2012-06-28 ENCOUNTER — Ambulatory Visit: Payer: Medicare Other

## 2012-06-28 ENCOUNTER — Ambulatory Visit (HOSPITAL_BASED_OUTPATIENT_CLINIC_OR_DEPARTMENT_OTHER): Payer: Medicare Other | Admitting: Pharmacist

## 2012-06-28 ENCOUNTER — Ambulatory Visit (HOSPITAL_COMMUNITY)
Admission: RE | Admit: 2012-06-28 | Discharge: 2012-06-28 | Disposition: A | Discharge status: Home / Self Care | Payer: Medicare Other | Source: Ambulatory Visit | Attending: Internal Medicine | Admitting: Internal Medicine

## 2012-06-28 ENCOUNTER — Other Ambulatory Visit (HOSPITAL_BASED_OUTPATIENT_CLINIC_OR_DEPARTMENT_OTHER): Payer: Medicare Other | Admitting: Lab

## 2012-06-28 ENCOUNTER — Other Ambulatory Visit: Payer: Medicare Other | Admitting: Lab

## 2012-06-28 DIAGNOSIS — I714 Abdominal aortic aneurysm, without rupture, unspecified: Secondary | ICD-10-CM | POA: Insufficient documentation

## 2012-06-28 DIAGNOSIS — T82898A Other specified complication of vascular prosthetic devices, implants and grafts, initial encounter: Secondary | ICD-10-CM

## 2012-06-28 DIAGNOSIS — I2510 Atherosclerotic heart disease of native coronary artery without angina pectoris: Secondary | ICD-10-CM | POA: Insufficient documentation

## 2012-06-28 DIAGNOSIS — R918 Other nonspecific abnormal finding of lung field: Secondary | ICD-10-CM | POA: Insufficient documentation

## 2012-06-28 DIAGNOSIS — C3490 Malignant neoplasm of unspecified part of unspecified bronchus or lung: Secondary | ICD-10-CM | POA: Insufficient documentation

## 2012-06-28 DIAGNOSIS — K7689 Other specified diseases of liver: Secondary | ICD-10-CM | POA: Insufficient documentation

## 2012-06-28 DIAGNOSIS — I82409 Acute embolism and thrombosis of unspecified deep veins of unspecified lower extremity: Secondary | ICD-10-CM

## 2012-06-28 LAB — COMPREHENSIVE METABOLIC PANEL (CC13)
ALT: 22 U/L (ref 0–55)
CO2: 24 mEq/L (ref 22–29)
Calcium: 9.8 mg/dL (ref 8.4–10.4)
Chloride: 102 mEq/L (ref 98–107)
Creatinine: 1 mg/dL (ref 0.6–1.1)
Glucose: 120 mg/dl — ABNORMAL HIGH (ref 70–99)
Sodium: 137 mEq/L (ref 136–145)
Total Protein: 7.2 g/dL (ref 6.4–8.3)

## 2012-06-28 LAB — CBC WITH DIFFERENTIAL/PLATELET
BASO%: 0.7 % (ref 0.0–2.0)
EOS%: 1.7 % (ref 0.0–7.0)
LYMPH%: 11.5 % — ABNORMAL LOW (ref 14.0–49.7)
MCHC: 34.4 g/dL (ref 31.5–36.0)
MCV: 93.3 fL (ref 79.5–101.0)
MONO%: 8.5 % (ref 0.0–14.0)
Platelets: 176 10*3/uL (ref 145–400)
RBC: 4.84 10*6/uL (ref 3.70–5.45)
RDW: 14 % (ref 11.2–14.5)

## 2012-06-28 LAB — PROTIME-INR
INR: 1.6 — ABNORMAL LOW (ref 2.00–3.50)
Protime: 19.2 Seconds — ABNORMAL HIGH (ref 10.6–13.4)

## 2012-06-28 LAB — POCT INR: INR: 1.6

## 2012-06-28 MED ORDER — HEPARIN SOD (PORK) LOCK FLUSH 100 UNIT/ML IV SOLN
500.0000 [IU] | Freq: Once | INTRAVENOUS | Status: AC
  Administered 2012-06-28: 500 [IU] via INTRAVENOUS
  Filled 2012-06-28: qty 5

## 2012-06-28 MED ORDER — SODIUM CHLORIDE 0.9 % IJ SOLN
10.0000 mL | INTRAMUSCULAR | Status: DC | PRN
  Administered 2012-06-28: 10 mL via INTRAVENOUS
  Filled 2012-06-28: qty 10

## 2012-06-28 MED ORDER — IOHEXOL 300 MG/ML  SOLN
100.0000 mL | Freq: Once | INTRAMUSCULAR | Status: AC | PRN
  Administered 2012-06-28: 100 mL via INTRAVENOUS

## 2012-06-28 NOTE — Progress Notes (Signed)
INR = 1.6 on 7.5 mg/day except 5 mg on Wed & Sat Pt has had increased vit K intake over [place].  She ate a lot of collard greens; "it means money" INR low.  Asymptomatic for VTE. Pt will take 10 mg today then 7.5 mg/day until 07/01/12. I s/w Dr. Arbutus Ped.  Pt will discontinue her Coumadin 07/01/12 as planned as she has completed 3 months of anticoagulation therapy. Pt archived in [facility].  INR's cancelled for future. Marily Lente, Pharm.D.

## 2012-06-29 ENCOUNTER — Ambulatory Visit: Payer: Medicare Other | Admitting: Lab

## 2012-06-29 ENCOUNTER — Telehealth: Payer: Self-pay | Admitting: Internal Medicine

## 2012-06-29 ENCOUNTER — Ambulatory Visit (HOSPITAL_BASED_OUTPATIENT_CLINIC_OR_DEPARTMENT_OTHER): Payer: Medicare Other | Admitting: Internal Medicine

## 2012-06-29 ENCOUNTER — Encounter: Payer: Self-pay | Admitting: Internal Medicine

## 2012-06-29 ENCOUNTER — Encounter: Payer: Self-pay | Admitting: *Deleted

## 2012-06-29 VITALS — BP 94/62 | HR 96 | Temp 98.3°F | Resp 20 | Ht 62.5 in | Wt 194.1 lb

## 2012-06-29 DIAGNOSIS — C3490 Malignant neoplasm of unspecified part of unspecified bronchus or lung: Secondary | ICD-10-CM

## 2012-06-29 DIAGNOSIS — C7931 Secondary malignant neoplasm of brain: Secondary | ICD-10-CM

## 2012-06-29 DIAGNOSIS — C771 Secondary and unspecified malignant neoplasm of intrathoracic lymph nodes: Secondary | ICD-10-CM

## 2012-06-29 DIAGNOSIS — C3430 Malignant neoplasm of lower lobe, unspecified bronchus or lung: Secondary | ICD-10-CM

## 2012-06-29 NOTE — Patient Instructions (Signed)
Your scan showed some evidence for disease progression. We discussed several treatment options including treatment with Tarceva versus single agent Alimta. We will order Veristrat test first before considering treatment with Tarceva.

## 2012-06-29 NOTE — Progress Notes (Signed)
Spoke with pt today at [facility].  Questions and concerns regarding new lab work answered and explained.  Pt verbalized understanding

## 2012-06-29 NOTE — Progress Notes (Signed)
[facility] Telephone:(336) [phone]   Fax:(336) [phone]  OFFICE PROGRESS NOTE  [name], MD [facility] [address] [place] [address]  DIAGNOSIS: Metastatic non-small cell lung cancer, favoring adenocarcinoma diagnosed in July of 2012, presented with locally advanced disease in the chest as well as brain metastasis.   PRIOR THERAPY:  1. Status post stereotactic radiotherapy to 2 brain lesions under the care of Dr. Mitzi Hansen. 2. Status post a course of concurrent chemoradiation with weekly carboplatin and paclitaxel, last dose of chemotherapy was given 03/05/2011. 3. Systemic chemotherapy with carboplatin for AUC of 5 and Alimta 500 mg/M2. The patient is status post 3 cycles.  CURRENT THERAPY: Observation.   INTERVAL HISTORY: Courtney West 62 y.o. female returns to the clinic today for three-month followup visit. The patient is feeling fine today with no specific complaints. She continues to have shortness breath with exertion. She denied having any significant chest pain, cough or hemoptysis. The patient denied having any significant weight loss or night sweats. She had repeat CT scan of the chest, abdomen and pelvis performed recently and she is here for evaluation and discussion of her scan results.  MEDICAL HISTORY: Past Medical History  Diagnosis Date  . Hypertension   . COPD (chronic obstructive pulmonary disease)   . Stroke   . Abdominal aortic aneurysm   . Dyslipidemia   . S/P radiation therapy 7/12 thru 9/12, 04/18/11    xrt to brain mets  . Lung cancer 12/2010  . Brain cancer     mets from lung primary  . Allergy     codeine,pcn,sulfa drugs  . Radiation 10/20/2011    frontal mets/Palliation  . History of radiation therapy 01/19/12, 01/21/12, 01/26/12    RLL lung  . History of radiation therapy 04/18/11    single fraction to 6 brain mets  . Clotted vascular catheter 03/30/2012  . Lung cancer 12/22/2010     ALLERGIES:  is allergic to carboplatin; codeine; penicillins; sulfa antibiotics; and dilaudid.  MEDICATIONS:  Current Outpatient Prescriptions  Medication Sig Dispense Refill  . amitriptyline (ELAVIL) 100 MG tablet Take 200 mg by mouth at bedtime.        Marland Kitchen atorvastatin (LIPITOR) 20 MG tablet Take 20 mg by mouth daily.      . budesonide-formoterol (SYMBICORT) 80-4.5 MCG/ACT inhaler Inhale 2 puffs into the lungs 2 (two) times daily.       Marland Kitchen gabapentin (NEURONTIN) 300 MG capsule Take 600 mg by mouth 3 (three) times daily.      Marland Kitchen HYDROcodone-acetaminophen (NORCO/VICODIN) 5-325 MG per tablet TAKE ONE TO TWO TABLETS BY MOUTH EVERY 6 HOURS AS NEEDED FOR PAIN  60 tablet  4  . lidocaine-prilocaine (EMLA) cream Apply topically as needed.  30 g  0  . LORazepam (ATIVAN) 1 MG tablet Take 1 mg by mouth as needed. Take 1tab po prior to rad tx or mri      . metoprolol succinate (TOPROL-XL) 50 MG 24 hr tablet Take 50 mg by mouth daily. Take with or immediately following a meal.      . pentoxifylline (TRENTAL) 400 MG CR tablet Take 1 tablet (400 mg total) by mouth 2 (two) times daily. 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 prochlorperazine (COMPAZINE) 10 MG tablet Take 10 mg by mouth every 6 (six) hours as needed. For nausea      . vitamin E  400 UNIT capsule Take 400 Units by mouth 2 (two) times daily.      Marland Kitchen warfarin (COUMADIN) 5 MG tablet Take 5 mg by mouth Daily.        SURGICAL HISTORY:  Past Surgical History  Procedure Date  . Carpal tunnel release   . Left knee surgery   . Cholecystectomy   . Partial hysterectomy     REVIEW OF SYSTEMS:  A comprehensive review of systems was negative.   PHYSICAL EXAMINATION: General appearance: alert, cooperative and no distress Head: Normocephalic, without obvious abnormality, atraumatic Neck: no adenopathy Lymph nodes: Cervical, supraclavicular, and axillary nodes normal. Resp: clear to auscultation  bilaterally Cardio: regular rate and rhythm, S1, S2 normal, no murmur, click, rub or gallop GI: soft, non-tender; bowel sounds normal; no masses,  no organomegaly Extremities: extremities normal, atraumatic, no cyanosis or edema Neurologic: Alert and oriented X 3, normal strength and tone. Normal symmetric reflexes. Normal coordination and gait  ECOG PERFORMANCE STATUS: 1 - Symptomatic but completely ambulatory  Blood pressure 94/62, pulse 96, temperature 98.3 F (36.8 C), temperature source Oral, resp. rate 20, height 5' 2.5" (1.588 m), weight 194 lb 1.6 oz (88.043 kg).  LABORATORY DATA: Lab Results  Component Value Date   WBC 10.0 06/28/2012   HGB 15.5 06/28/2012   HCT 45.1 06/28/2012   MCV 93.3 06/28/2012   PLT 176 06/28/2012      Chemistry      Component Value Date/Time   NA 137 06/28/2012 1007   NA 143 12/24/2011 1311   NA 138 09/06/2011 2145   K 4.8 06/28/2012 1007   K 4.7 12/24/2011 1311   K 4.3 09/06/2011 2145   CL 102 06/28/2012 1007   CL 99 12/24/2011 1311   CL 102 09/06/2011 2145   CO2 24 06/28/2012 1007   CO2 31 12/24/2011 1311   CO2 29 09/06/2011 2145   BUN 15.0 06/28/2012 1007   BUN 13 12/24/2011 1311   BUN 22 09/06/2011 2145   CREATININE 1.0 06/28/2012 1007   CREATININE 1.2 12/24/2011 1311   CREATININE 1.15* 09/06/2011 2145      Component Value Date/Time   CALCIUM 9.8 06/28/2012 1007   CALCIUM 10.2 12/24/2011 1311   CALCIUM 9.6 09/06/2011 2145   ALKPHOS 118 06/28/2012 1007   ALKPHOS 98* 12/24/2011 1311   ALKPHOS 103 07/30/2011 0053   AST 18 06/28/2012 1007   AST 18 12/24/2011 1311   AST 13 07/30/2011 0053   ALT 22 06/28/2012 1007   ALT 12 07/30/2011 0053   BILITOT 0.21 06/28/2012 1007   BILITOT 0.50 12/24/2011 1311   BILITOT 0.1* 07/30/2011 0053       RADIOGRAPHIC STUDIES: Ct Chest W Contrast  06/28/2012  *RADIOLOGY REPORT*  Clinical Data:  Restaging lung cancer  CT CHEST, ABDOMEN AND PELVIS WITH CONTRAST  Technique:  Multidetector CT imaging of the chest, abdomen and pelvis was performed following the  standard protocol during bolus administration of intravenous contrast.  Contrast: OMNIPAQUE IOHEXOL 300 MG/ML  SOLN  Comparison:  03/29/2012   CT CHEST  Findings:  Lungs/pleura: Subpleural nodule in the posterior right upper lobe measures 5 mm, image 11/series 5.  This is unchanged from previous exam.  Radiation change is noted within paramediastinal left lung and appears similar to the previous exam. New radiation change within the right lower lobe identified. There has been improvement in the previously noted right lower lobe nodule which currently measures 9 x 10 mm, image 31/series 5.  Previously 2.5  x 2.1 cm. There is no new or enlarging pulmonary nodule or mass.   Heart/Mediastinum: Normal heart size.  No pericardial effusion. Calcifications involving the LAD coronary artery and noted.  Low right paratracheal lymph node measures 1.3 cm, image 22. Previously this measured 1 cm.  Right hilar lymph node measures 2.2 cm, image 25.  Previously 1.4 cm.  Bones/Musculoskeletal:  No abnormality noted.  No aggressive lytic or sclerotic bone lesions identified  IMPRESSION:  1.  Increase in size of right hilar and mediastinal lymph nodes. Cannot rule out metastatic adenopathy. 2.  Decrease in size of right lower lobe pulmonary nodule with new changes of external beam radiation. 3.  Stable radiation change within the left lung. 4.  Stable subpleural nodule in the right upper lobe.   CT ABDOMEN AND PELVIS  Findings:  Mild diffuse low attenuation throughout the liver parenchyma.  No focal liver abnormalities noted.  Prior cholecystectomy.  No biliary dilatation.  The pancreas appears within normal limits.  Calcifications along the lateral margin of the spleen are stable.  The adrenal glands both appear normal.  Normal appearance of the right kidney.  The left kidney is normal. Urinary bladder appears normal.  Infrarenal abdominal aortic aneurysm is identified.  This measures 4.7 cm and AP dimension, image 68.   Previously this measured at 4.9 cm.  There is no adenopathy within the upper abdomen.  No pelvic or inguinal adenopathy.  The stomach is normal.  The small bowel loops have a normal course and caliber without evidence for obstruction.  Normal appearance of the colon.  Review of the visualized osseous structures shows no aggressive lytic or sclerotic bone lesions  IMPRESSION:  1.  No acute findings and no evidence for metastatic disease to the upper abdomen.  The 2.  Stable infrarenal abdominal aortic aneurysm measuring 4.9 cm. 3.  Hepatic steatosis.   Original Report Authenticated By: Signa Kell, M.D.    ASSESSMENT: This is a very pleasant 62 years old white female with metastatic non-small cell lung cancer status post stereotactic radiotherapy to 2 brain lesion followed by concurrent chemoradiation followed by 3 cycles of consolidation chemotherapy with carboplatin and Alimta. The patient has been observation but unfortunately she has some evidence for disease progression especially in the right hilar and mediastinal lymph node.   PLAN: I discussed the scan results with the patient and showed the images today. I recommended for her to consider resuming treatment again either with single agent Alimta versus oral Tarceva. The patient is interested in oral Tarceva. I will order a Veristrat test to see the patient would be a good candidate for treatment with this option. If the Veristrat test is good will consider her for treatment with Tarceva 150 mg by mouth daily but if she has Veristrat poor, I would consider her for treatment with single agent Alimta. The patient would come back for followup visit in one week for reevaluation and more detailed discussion of her treatment options based on the blood test. She was advised to call me immediately if she has any concerning symptoms in the interval.  All questions were answered. The patient knows to call the clinic with any problems, questions or concerns. We  can certainly see the patient much sooner if necessary.  I spent 15 minutes counseling the patient face to face. The total time spent in the appointment was 25 minutes.

## 2012-06-29 NOTE — Telephone Encounter (Signed)
Gave pt appt md visit for January 2014 , pt sent to lab today

## 2012-06-30 ENCOUNTER — Ambulatory Visit: Payer: Medicare Other | Admitting: Internal Medicine

## 2012-07-07 ENCOUNTER — Telehealth: Payer: Self-pay | Admitting: Internal Medicine

## 2012-07-07 ENCOUNTER — Ambulatory Visit (HOSPITAL_BASED_OUTPATIENT_CLINIC_OR_DEPARTMENT_OTHER): Payer: Medicare Other | Admitting: Internal Medicine

## 2012-07-07 ENCOUNTER — Encounter: Payer: Self-pay | Admitting: Internal Medicine

## 2012-07-07 VITALS — BP 110/67 | HR 87 | Temp 98.6°F | Resp 20 | Ht 62.5 in | Wt 191.2 lb

## 2012-07-07 DIAGNOSIS — C3410 Malignant neoplasm of upper lobe, unspecified bronchus or lung: Secondary | ICD-10-CM

## 2012-07-07 DIAGNOSIS — R0602 Shortness of breath: Secondary | ICD-10-CM

## 2012-07-07 DIAGNOSIS — C3490 Malignant neoplasm of unspecified part of unspecified bronchus or lung: Secondary | ICD-10-CM

## 2012-07-07 DIAGNOSIS — C7931 Secondary malignant neoplasm of brain: Secondary | ICD-10-CM

## 2012-07-07 NOTE — Patient Instructions (Signed)
Veristrat test was poor. We discussed systemic chemotherapy but you declined to proceed with treatment. Followup in 3 months with repeat scans

## 2012-07-07 NOTE — Telephone Encounter (Signed)
Gave pt oral contrast for CT pt will see Md on April 2014 with labs

## 2012-07-07 NOTE — Progress Notes (Signed)
[facility] Telephone:([phone]   Fax:([phone]  OFFICE PROGRESS NOTE  [name], MD [facility] [address] [place] [address]  DIAGNOSIS: Metastatic non-small cell lung cancer, favoring adenocarcinoma diagnosed in July of 2012, presented with locally advanced disease in the chest as well as brain metastasis. Veristrat test poor.  PRIOR THERAPY:  1. Status post stereotactic radiotherapy to 2 brain lesions under the care of Dr. Mitzi Hansen. 2. Status post a course of concurrent chemoradiation with weekly carboplatin and paclitaxel, last dose of chemotherapy was given 03/05/2011. 3. Systemic chemotherapy with carboplatin for AUC of 5 and Alimta 500 mg/M2. The patient is status post 3 cycles. Last dose was given 06/03/2011  CURRENT THERAPY: Observation.  INTERVAL HISTORY: Courtney West 62 y.o. female returns to the clinic today for followup visit. The patient is feeling fine today except for shortness of breath with minimal exertion. Recent CT scan of the chest, abdomen and pelvis showed evidence for disease progression. I discussed with the patient several treatment options including treatment with Tarceva versus chemotherapy and we ordered a Veristrat test. The results came back poor. She is here today for evaluation and recommendation regarding treatment of her condition. Oxygen saturation with exercise in the clinic today was in the 70s but increased up to 95% on oxygen 2 L per minute.  MEDICAL HISTORY: Past Medical History  Diagnosis Date  . Hypertension   . COPD (chronic obstructive pulmonary disease)   . Stroke   . Abdominal aortic aneurysm   . Dyslipidemia   . S/P radiation therapy 7/12 thru 9/12, 04/18/11    xrt to brain mets  . Lung cancer 12/2010  . Brain cancer     mets from lung primary  . Allergy     codeine,pcn,sulfa drugs  . Radiation 10/20/2011    frontal mets/Palliation  . History of radiation therapy  01/19/12, 01/21/12, 01/26/12    RLL lung  . History of radiation therapy 04/18/11    single fraction to 6 brain mets  . Clotted vascular catheter 03/30/2012  . Lung cancer 12/22/2010    ALLERGIES:  is allergic to carboplatin; codeine; dilaudid; penicillins; sulfa antibiotics; and dilaudid.  MEDICATIONS:  Current Outpatient Prescriptions  Medication Sig Dispense Refill  . amitriptyline (ELAVIL) 100 MG tablet Take 200 mg by mouth at bedtime.        Marland Kitchen atorvastatin (LIPITOR) 20 MG tablet Take 20 mg by mouth daily.      . budesonide-formoterol (SYMBICORT) 80-4.5 MCG/ACT inhaler Inhale 2 puffs into the lungs 2 (two) times daily.       Marland Kitchen gabapentin (NEURONTIN) 300 MG capsule Take 600 mg by mouth 3 (three) times daily.      Marland Kitchen HYDROcodone-acetaminophen (NORCO/VICODIN) 5-325 MG per tablet TAKE ONE TO TWO TABLETS BY MOUTH EVERY 6 HOURS AS NEEDED FOR PAIN  60 tablet  4  . lidocaine-prilocaine (EMLA) cream Apply topically as needed.  30 g  0  . LORazepam (ATIVAN) 1 MG tablet Take 1 mg by mouth as needed. Take 1tab po prior to rad tx or mri      . metoprolol succinate (TOPROL-XL) 50 MG 24 hr tablet Take 50 mg by mouth daily. Take with or immediately following a meal.      . pentoxifylline (TRENTAL) 400 MG CR tablet Take 1 tablet (400 mg total) by mouth 2 (two) times daily. 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 prochlorperazine (COMPAZINE) 10 MG tablet Take 10 mg by mouth every 6 (six) hours as needed. For nausea      . vitamin E 400 UNIT capsule Take 400 Units by mouth 2 (two) times daily.      Marland Kitchen warfarin (COUMADIN) 5 MG tablet Take 5 mg by mouth Daily.        SURGICAL HISTORY:  Past Surgical History  Procedure Date  . Carpal tunnel release   . Left knee surgery   . Cholecystectomy   . Partial hysterectomy     REVIEW OF SYSTEMS:  A comprehensive review of systems was negative except for: Constitutional: positive for fatigue Respiratory: positive for dyspnea  on exertion   PHYSICAL EXAMINATION: General appearance: alert, cooperative and no distress Head: Normocephalic, without obvious abnormality, atraumatic Neck: no adenopathy Resp: wheezes bilaterally Cardio: regular rate and rhythm, S1, S2 normal, no murmur, click, rub or gallop GI: soft, non-tender; bowel sounds normal; no masses,  no organomegaly Extremities: extremities normal, atraumatic, no cyanosis or edema  ECOG PERFORMANCE STATUS: 1 - Symptomatic but completely ambulatory  Blood pressure 110/67, pulse 87, temperature 98.6 F (37 C), temperature source Oral, resp. rate 20, height 5' 2.5" (1.588 m), weight 191 lb 3.2 oz (86.728 kg).  LABORATORY DATA: Lab Results  Component Value Date   WBC 10.0 06/28/2012   HGB 15.5 06/28/2012   HCT 45.1 06/28/2012   MCV 93.3 06/28/2012   PLT 176 06/28/2012      Chemistry      Component Value Date/Time   NA 137 06/28/2012 1007   NA 143 12/24/2011 1311   NA 138 09/06/2011 2145   K 4.8 06/28/2012 1007   K 4.7 12/24/2011 1311   K 4.3 09/06/2011 2145   CL 102 06/28/2012 1007   CL 99 12/24/2011 1311   CL 102 09/06/2011 2145   CO2 24 06/28/2012 1007   CO2 31 12/24/2011 1311   CO2 29 09/06/2011 2145   BUN 15.0 06/28/2012 1007   BUN 13 12/24/2011 1311   BUN 22 09/06/2011 2145   CREATININE 1.0 06/28/2012 1007   CREATININE 1.2 12/24/2011 1311   CREATININE 1.15* 09/06/2011 2145      Component Value Date/Time   CALCIUM 9.8 06/28/2012 1007   CALCIUM 10.2 12/24/2011 1311   CALCIUM 9.6 09/06/2011 2145   ALKPHOS 118 06/28/2012 1007   ALKPHOS 98* 12/24/2011 1311   ALKPHOS 103 07/30/2011 0053   AST 18 06/28/2012 1007   AST 18 12/24/2011 1311   AST 13 07/30/2011 0053   ALT 22 06/28/2012 1007   ALT 12 07/30/2011 0053   BILITOT 0.21 06/28/2012 1007   BILITOT 0.50 12/24/2011 1311   BILITOT 0.1* 07/30/2011 0053       RADIOGRAPHIC STUDIES: Ct Chest W Contrast  06/28/2012  *RADIOLOGY REPORT*  Clinical Data:  Restaging lung cancer  CT CHEST, ABDOMEN AND PELVIS WITH CONTRAST  Technique:  Multidetector CT  imaging of the chest, abdomen and pelvis was performed following the standard protocol during bolus administration of intravenous contrast.  Contrast: OMNIPAQUE IOHEXOL 300 MG/ML  SOLN  Comparison:  03/29/2012  CT CHEST  Findings:  Lungs/pleura: Subpleural nodule in the posterior right upper lobe measures 5 mm, image 11/series 5.  This is unchanged from previous exam.  Radiation change is noted within paramediastinal left lung and appears similar to the previous exam. New radiation change within the right lower lobe identified. There has been improvement in the previously noted right lower  lobe nodule which currently measures 9 x 10 mm, image 31/series 5.  Previously 2.5 x 2.1 cm. There is no new or enlarging pulmonary nodule or mass.   Heart/Mediastinum: Normal heart size.  No pericardial effusion. Calcifications involving the LAD coronary artery and noted.  Low right paratracheal lymph node measures 1.3 cm, image 22. Previously this measured 1 cm.  Right hilar lymph node measures 2.2 cm, image 25.  Previously 1.4 cm.  Bones/Musculoskeletal:  No abnormality noted.  No aggressive lytic or sclerotic bone lesions identified  IMPRESSION:  1.  Increase in size of right hilar and mediastinal lymph nodes. Cannot rule out metastatic adenopathy. 2.  Decrease in size of right lower lobe pulmonary nodule with new changes of external beam radiation. 3.  Stable radiation change within the left lung. 4.  Stable subpleural nodule in the right upper lobe.  CT ABDOMEN AND PELVIS  Findings:  Mild diffuse low attenuation throughout the liver parenchyma.  No focal liver abnormalities noted.  Prior cholecystectomy.  No biliary dilatation.  The pancreas appears within normal limits.  Calcifications along the lateral margin of the spleen are stable.  The adrenal glands both appear normal.  Normal appearance of the right kidney.  The left kidney is normal. Urinary bladder appears normal.  Infrarenal abdominal aortic aneurysm is  identified.  This measures 4.7 cm and AP dimension, image 68.  Previously this measured at 4.9 cm.  There is no adenopathy within the upper abdomen.  No pelvic or inguinal adenopathy.  The stomach is normal.  The small bowel loops have a normal course and caliber without evidence for obstruction.  Normal appearance of the colon.  Review of the visualized osseous structures shows no aggressive lytic or sclerotic bone lesions  IMPRESSION:  1.  No acute findings and no evidence for metastatic disease to the upper abdomen.  The 2.  Stable infrarenal abdominal aortic aneurysm measuring 4.9 cm. 3.  Hepatic steatosis.   Original Report Authenticated By: Signa Kell, M.D.    ASSESSMENT: This is a very pleasant 62 years old white female with metastatic non-small cell lung cancer with brain metastasis status post stereotactic radiotherapy followed by concurrent chemoradiation and consolidation chemotherapy. She has been observation since December of 2012 but now with evidence for disease progression especially in the chest. Her Veristrat test was poor  PLAN: I have a lengthy discussion with the patient today about her condition. I recommended for her to consider systemic chemotherapy again and consider the patient for treatment with single agent Alimta. After lengthy discussion with the patient she is very reluctant to consider proceeding with chemotherapy. She would like to wait until to meet with Dr. Kathrynn Running and Dr. Rivka Safer about her brain metastasis first. She is expected to have repeat MRI of the brain next month. I will arrange for the patient to have followup visit in 3 months with repeat CT scan of the chest, abdomen and pelvis for evaluation of her disease. She was advised to call me immediately if she changes her mind about starting chemotherapy. For the dyspnea, I will arrange for the patient to have home oxygen before going home today.  All questions were answered. The patient knows to call the clinic  with any problems, questions or concerns. We can certainly see the patient much sooner if necessary.

## 2012-07-07 NOTE — Progress Notes (Addendum)
Pt at MD visit complaining of shortness of breath.  Per Dr Arbutus Ped, pt needs pulse ox reading.  Nurse Lurena Nida walked patient and O2 saturation dropped to 72% on room air.  Pt was seated back in the exam room and placed on 2L nasal cannula.  O2 saturation increased to 95% on 2L while patient at rest.  Orders faxed to [facility] for home O2 to be delivered to [facility] so she can go home with O2 tank.

## 2012-07-08 ENCOUNTER — Telehealth: Payer: Self-pay | Admitting: Medical Oncology

## 2012-07-08 NOTE — Telephone Encounter (Signed)
Pt coming back in today for me to check oxygen sat at rest on room air

## 2012-07-08 NOTE — Telephone Encounter (Signed)
Need more information for qualifying for home oxygen

## 2012-07-08 NOTE — Telephone Encounter (Signed)
Oxygen saturation at rest on room air = 88%-. Sent to [facility]

## 2012-07-30 ENCOUNTER — Ambulatory Visit
Admission: RE | Admit: 2012-07-30 | Discharge: 2012-07-30 | Disposition: A | Discharge status: Home / Self Care | Payer: Medicare Other | Source: Ambulatory Visit | Attending: Radiation Oncology | Admitting: Radiation Oncology

## 2012-07-30 DIAGNOSIS — C7931 Secondary malignant neoplasm of brain: Secondary | ICD-10-CM

## 2012-07-30 MED ORDER — GADOBENATE DIMEGLUMINE 529 MG/ML IV SOLN
17.0000 mL | Freq: Once | INTRAVENOUS | Status: AC | PRN
  Administered 2012-07-30: 17 mL via INTRAVENOUS

## 2012-08-01 ENCOUNTER — Encounter: Payer: Self-pay | Admitting: Radiation Oncology

## 2012-08-01 NOTE — Progress Notes (Signed)
Radiation Oncology         [phone] ________________________________  Name: MARLEE ARMENTEROS MRN: [id number]  Date: 08/02/2012  DOB: [birth year]  Multidisciplinary Neuro Oncology Clinic Follow-Up Visit Note  CC: [name], MD  [name], MD  Diagnosis:   62 year old woman with metastatic lung cancer status post: 1. SRS to a 10-mm left frontal and 5-mm right frontal brain metastasis to 20 Gy on January 16, 2011.  2. The patient's primary lung cancer was treated to 66 Gy in 33 fractions of 2 Gy through March 10, 2011 . 3. SRS to 6 new brain metastases (2 adjacent left temporal brain metastases, a 4.7-mm vermis target, a 3.4-mm right frontal brain metastasis, a left parietal 5.3-mm brain metastasis, and a 6.6-mm left cerebellar brain metastasis) received 20 Gy in a single fraction.April 18, 2011. 4. SRS to a 4 mm posterior frontal target to 20 Gy on 10/20/2011 5. SBRT to a 1.6 cm metachronous clinical stage IA primary cancer of the right lower lung on 01/19/12, 01/21/12, and 01/26/12 to 54 gray in 3 fractions of 18 gray   Interval Since Last Radiation:  6  months  Narrative:  The patient returns today for routine follow-up.  The recent films were presented in our multidisciplinary conference with neuroradiology just prior to the clinic.  She has subtle enlargement of two of her previously treated mets, potentially reflecting radiation effect versus possible persistent disease.                              ALLERGIES:  is allergic to carboplatin; codeine; dilaudid; penicillins; sulfa antibiotics; and dilaudid.  Meds: Current Outpatient Prescriptions  Medication Sig Dispense Refill  . amitriptyline (ELAVIL) 100 MG tablet Take 200 mg by mouth at bedtime.        Marland Kitchen atorvastatin (LIPITOR) 20 MG tablet Take 20 mg by mouth daily.      . budesonide-formoterol (SYMBICORT) 80-4.5 MCG/ACT inhaler Inhale 2 puffs into the lungs 2 (two) times daily.       Marland Kitchen gabapentin (NEURONTIN) 300 MG capsule  Take 600 mg by mouth 3 (three) times daily.      Marland Kitchen HYDROcodone-acetaminophen (NORCO/VICODIN) 5-325 MG per tablet TAKE ONE TO TWO TABLETS BY MOUTH EVERY 6 HOURS AS NEEDED FOR PAIN  60 tablet  4  . lidocaine-prilocaine (EMLA) cream Apply topically as needed.  30 g  0  . LORazepam (ATIVAN) 1 MG tablet Take 1 mg by mouth as needed. Take 1tab po prior to rad tx or mri      . metoprolol succinate (TOPROL-XL) 50 MG 24 hr tablet Take 50 mg by mouth daily. Take with or immediately following a meal.      . pentoxifylline (TRENTAL) 400 MG CR tablet Take 1 tablet (400 mg total) by mouth 2 (two) times daily. 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 prochlorperazine (COMPAZINE) 10 MG tablet Take 10 mg by mouth every 6 (six) hours as needed. For nausea      . vitamin E 400 UNIT capsule Take 400 Units by mouth 2 (two) times daily.      Marland Kitchen warfarin (COUMADIN) 5 MG tablet Take 5 mg by mouth Daily.       No current facility-administered medications for this encounter.    Physical Findings: The patient is in no acute distress. Patient is alert and oriented.  vitals were not taken  for this visit..  No significant changes.  Lab Findings: Lab Results  Component Value Date   WBC 10.0 06/28/2012   HGB 15.5 06/28/2012   HCT 45.1 06/28/2012   MCV 93.3 06/28/2012   PLT 176 06/28/2012    @LASTCHEM @  Radiographic Findings: Mr [name] GN Contrast  07/30/2012  *RADIOLOGY REPORT*  Clinical Data: Lung cancer with metastatic disease.  43-month restaging post stereotactic radiosurgery.  MRI HEAD WITHOUT AND WITH CONTRAST  Technique:  Multiplanar, multiecho pulse sequences of the brain and surrounding structures were obtained according to standard protocol without and with intravenous contrast  Contrast: 17mL MULTIHANCE GADOBENATE DIMEGLUMINE 529 MG/ML IV SOLN  Comparison: MRI 04/22/2012  Findings: Ventricle size is normal and there is no shift of the midline structures.  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 No acute infarct.  Mild hyperintensity right frontal white matter is unchanged.  Remaining white matter is intact.  There is no hemorrhage or mass.  4 mm enhancing nodule left parietal cortex is slightly larger. 6 x 5 mm enhancing nodule right frontal lobe is slightly larger. Linear enhancing nodule involving the operculum on the right measures 31 x 74 mm and appears slightly smaller. Tiny enhancing nodule right anterior frontal lobe no longer visualized.  No new lesions are identified.  IMPRESSION: Slight interval growth of right frontal and left parietal enhancing lesions compatible with metastatic disease.  Slight interval improvement in lesion in the right operculum. Right anterior frontal lobe tiny lesion no longer visualized.   Original Report Authenticated By: Janeece Riggers, M.D.    Impression:  The patient is recovering from the effects of radiation.  She has some enlargement of a right frontal and left parietal brain met.  When comparing with her previous SRS, these two sites have been treated, so hopefully, the minimal enlargement reflects radiation effects rather than recurrent tumor.  Note, the patient's most recent chest CT also reveals enlargement of right hilar and mediastinal lymph nodes potentially related to her recently treated right lower lung nodule. In comparing her current enlarged nodes to her previous left lung radiotherapy, the position of the currently enlarging lymph nodes appears to be largely outside of previous radiation distribution, potentially facilitating future radiotherapy if clinically indicated.  Plan:  In talking with the patient today, she has had a change in her mind set related to treatment of her cancer. She has a very good understanding that she was originally diagnosed with stage IV cancer which is technically not curable. She feels that efforts to control sites of  progressive disease her somewhat futile in the face of an incurable process. I explained to her that we have probably extended her overall survival and proved her quality of life by spot treating her previous sites of disease including active brain metastases. She understands this, however she is currently most interested in pursuing supportive care with no interventions. Prior to finalizing her treatment decision, patient is interested in pursuing followup CT scans of the chest abdomen pelvis as well as brain MRI for one more cycle of followup surveillance. We'll look for talking with her again at that time in approximately 3 months.  _____________________________________  Artist Pais. Kathrynn Running, M.D.

## 2012-08-02 ENCOUNTER — Ambulatory Visit
Admission: RE | Admit: 2012-08-02 | Discharge: 2012-08-02 | Disposition: A | Discharge status: Home / Self Care | Payer: Medicare Other | Source: Ambulatory Visit | Attending: Radiation Oncology | Admitting: Radiation Oncology

## 2012-08-02 ENCOUNTER — Encounter: Payer: Self-pay | Admitting: Radiation Oncology

## 2012-08-02 VITALS — BP 119/83 | HR 85 | Temp 98.2°F | Resp 20 | Wt 191.4 lb

## 2012-08-02 DIAGNOSIS — C719 Malignant neoplasm of brain, unspecified: Secondary | ICD-10-CM

## 2012-08-02 NOTE — Progress Notes (Signed)
Patient here follow up  S/p rad txs brain mets/lung ccancer MRI 07/30/12, alert,oriented x3, 93% room air sats, no c/o nausea, no blurred vision, no shortness breath, no pain, patioent stated she was going to stop everything, no more chemotherapy , taking trental and vitamin e, not taking decadron, eating well, energy level okay 11:04 AM

## 2012-08-12 ENCOUNTER — Other Ambulatory Visit: Payer: Self-pay | Admitting: Radiation Therapy

## 2012-09-08 ENCOUNTER — Other Ambulatory Visit: Payer: Self-pay | Admitting: Radiation Oncology

## 2012-09-08 ENCOUNTER — Telehealth: Payer: Self-pay | Admitting: *Deleted

## 2012-09-08 MED ORDER — GABAPENTIN 300 MG PO CAPS: 600.0000 mg | ORAL_CAPSULE | Freq: Three times a day (TID) | ORAL | Status: DC

## 2012-09-08 NOTE — Telephone Encounter (Signed)
Patient called requesting refill on her gabapentin rx last written by Dr.Manning in October /2013;, after speaking with patient on the phone, will give information to on call MD,  Since Dr.Manning is out of office this week,patient stated" Dr.Manning usually writes 6 refills" informed patient ,  and will call patient back with status , per Dr.Wentworth, gabapentin rx  Refilled with 2 additional refills until patient can be seen with Dr.Manning her next follow up in April this year; called patient  And told Rx was sent to [facility] pharmacy in [place], patient thanked this MD and nurse for call back so soon and for the rx 3:16 PM

## 2012-09-30 ENCOUNTER — Other Ambulatory Visit: Payer: Self-pay | Admitting: Radiation Oncology

## 2012-10-04 ENCOUNTER — Telehealth: Payer: Self-pay

## 2012-10-04 NOTE — Telephone Encounter (Signed)
Electronic prescription didn't go through to [facility] [phone] , so called in today hydrocodone per order on 09/30/1012.Patient informed to allow 1 to 2 hours prior to pick up.

## 2012-10-05 ENCOUNTER — Ambulatory Visit: Payer: Medicare Other | Admitting: Internal Medicine

## 2012-10-06 ENCOUNTER — Encounter (HOSPITAL_COMMUNITY): Payer: Self-pay

## 2012-10-06 ENCOUNTER — Other Ambulatory Visit (HOSPITAL_BASED_OUTPATIENT_CLINIC_OR_DEPARTMENT_OTHER): Payer: Medicare Other

## 2012-10-06 ENCOUNTER — Ambulatory Visit (HOSPITAL_COMMUNITY)
Admission: RE | Admit: 2012-10-06 | Discharge: 2012-10-06 | Disposition: A | Discharge status: Home / Self Care | Payer: Medicare Other | Source: Ambulatory Visit | Attending: Internal Medicine | Admitting: Internal Medicine

## 2012-10-06 DIAGNOSIS — J438 Other emphysema: Secondary | ICD-10-CM | POA: Insufficient documentation

## 2012-10-06 DIAGNOSIS — K7689 Other specified diseases of liver: Secondary | ICD-10-CM | POA: Insufficient documentation

## 2012-10-06 DIAGNOSIS — C3490 Malignant neoplasm of unspecified part of unspecified bronchus or lung: Secondary | ICD-10-CM

## 2012-10-06 DIAGNOSIS — I719 Aortic aneurysm of unspecified site, without rupture: Secondary | ICD-10-CM | POA: Insufficient documentation

## 2012-10-06 DIAGNOSIS — M545 Low back pain, unspecified: Secondary | ICD-10-CM | POA: Insufficient documentation

## 2012-10-06 DIAGNOSIS — I319 Disease of pericardium, unspecified: Secondary | ICD-10-CM | POA: Insufficient documentation

## 2012-10-06 LAB — CBC WITH DIFFERENTIAL/PLATELET
BASO%: 0.6 % (ref 0.0–2.0)
Basophils Absolute: 0.1 10*3/uL (ref 0.0–0.1)
EOS%: 2 % (ref 0.0–7.0)
MCH: 30.4 pg (ref 25.1–34.0)
MCHC: 33.5 g/dL (ref 31.5–36.0)
MCV: 90.7 fL (ref 79.5–101.0)
MONO%: 7.7 % (ref 0.0–14.0)
RBC: 4.94 10*6/uL (ref 3.70–5.45)
RDW: 13.5 % (ref 11.2–14.5)

## 2012-10-06 LAB — COMPREHENSIVE METABOLIC PANEL (CC13)
Alkaline Phosphatase: 130 U/L (ref 40–150)
Glucose: 102 mg/dl — ABNORMAL HIGH (ref 70–99)
Sodium: 143 mEq/L (ref 136–145)
Total Bilirubin: 0.23 mg/dL (ref 0.20–1.20)
Total Protein: 7.3 g/dL (ref 6.4–8.3)

## 2012-10-06 MED ORDER — IOHEXOL 300 MG/ML  SOLN
100.0000 mL | Freq: Once | INTRAMUSCULAR | Status: AC | PRN
  Administered 2012-10-06: 100 mL via INTRAVENOUS

## 2012-10-11 ENCOUNTER — Telehealth: Payer: Self-pay | Admitting: Internal Medicine

## 2012-10-11 ENCOUNTER — Encounter: Payer: Self-pay | Admitting: Internal Medicine

## 2012-10-11 ENCOUNTER — Ambulatory Visit (HOSPITAL_BASED_OUTPATIENT_CLINIC_OR_DEPARTMENT_OTHER): Payer: Medicare Other | Admitting: Internal Medicine

## 2012-10-11 VITALS — BP 144/79 | HR 89 | Temp 96.7°F | Resp 18 | Ht 62.5 in | Wt 196.1 lb

## 2012-10-11 DIAGNOSIS — C7931 Secondary malignant neoplasm of brain: Secondary | ICD-10-CM

## 2012-10-11 DIAGNOSIS — C3490 Malignant neoplasm of unspecified part of unspecified bronchus or lung: Secondary | ICD-10-CM

## 2012-10-11 DIAGNOSIS — F17200 Nicotine dependence, unspecified, uncomplicated: Secondary | ICD-10-CM

## 2012-10-11 DIAGNOSIS — I319 Disease of pericardium, unspecified: Secondary | ICD-10-CM

## 2012-10-11 NOTE — Patient Instructions (Signed)
No evidence for disease progression on his recent scan but increased in the pericardial effusion. Evaluation by your cardiologist in 2 days for consideration of echocardiogram and referral to thoracic surgery as needed. Followup in 3 months

## 2012-10-11 NOTE — Progress Notes (Signed)
[facility] [phone]) [phone]   Fax:(336) [phone]  OFFICE PROGRESS NOTE  Courtney Blamer, MD [facility] [address] [place] [address]  DIAGNOSIS: Metastatic non-small cell lung cancer, favoring adenocarcinoma diagnosed in July of 2012, presented with locally advanced disease in the chest as well as brain metastasis. Veristrat test poor.   PRIOR THERAPY:  1. Status post stereotactic radiotherapy to 2 brain lesions under the care of Dr. Mitzi Hansen. 2. Status post a course of concurrent chemoradiation with weekly carboplatin and paclitaxel, last dose of chemotherapy was given 03/05/2011. 3. Systemic chemotherapy with carboplatin for AUC of 5 and Alimta 500 mg/M2. The patient is status post 3 cycles. Last dose was given 06/03/2011  CURRENT THERAPY: Observation.   INTERVAL HISTORY: Courtney West 62 y.o. female returns to the clinic today for followup visit accompanied friend. The patient has no complaints today except for shortness breath with exertion. She denied having any significant chest pain, cough or hemoptysis. She denied having any significant weight loss or night sweats. The patient has been observation and she had repeat CT scan of the chest, abdomen and pelvis recently. She is here today for evaluation and discussion of her scan results. Unfortunately she continues to smoke and is not willing to quit.  MEDICAL HISTORY: Past Medical History  Diagnosis Date  . Hypertension   . COPD (chronic obstructive pulmonary disease)   . Stroke   . Abdominal aortic aneurysm   . Dyslipidemia   . S/P radiation therapy 7/12 thru 9/12, 04/18/11    xrt to brain mets  . Lung cancer 12/2010  . Brain cancer     mets from lung primary  . Allergy     codeine,pcn,sulfa drugs  . Radiation 10/20/2011    frontal mets/Palliation  . History of radiation therapy 01/19/12, 01/21/12, 01/26/12    RLL lung  . History of radiation therapy 04/18/11   single fraction to 6 brain mets  . Clotted vascular catheter 03/30/2012  . Lung cancer 12/22/2010    ALLERGIES:  is allergic to carboplatin; codeine; dilaudid; penicillins; sulfa antibiotics; and dilaudid.  MEDICATIONS:  Current Outpatient Prescriptions  Medication Sig Dispense Refill  . amitriptyline (ELAVIL) 100 MG tablet Take 200 mg by mouth at bedtime.        Marland Kitchen atorvastatin (LIPITOR) 20 MG tablet Take 20 mg by mouth daily.      . budesonide-formoterol (SYMBICORT) 80-4.5 MCG/ACT inhaler Inhale 2 puffs into the lungs as needed.       . gabapentin (NEURONTIN) 300 MG capsule Take 2 capsules (600 mg total) by mouth 3 (three) times daily.  180 capsule  2  . HYDROcodone-acetaminophen (NORCO/VICODIN) 5-325 MG per tablet TAKE ONE TO TWO TABLETS BY MOUTH EVERY 6 HOURS AS NEEDED FOR PAIN  60 tablet  0  . lidocaine-prilocaine (EMLA) cream Apply topically as needed.  30 g  0  . metoprolol succinate (TOPROL-XL) 50 MG 24 hr tablet Take 50 mg by mouth daily. Take with or immediately following a meal.      . pentoxifylline (TRENTAL) 400 MG CR tablet Take 1 tablet (400 mg total) by mouth 2 (two) times daily. 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 vitamin E 400 UNIT capsule Take 400 Units by mouth 2 (two) times daily.       No current facility-administered medications for this visit.    SURGICAL HISTORY:  Past  Surgical History  Procedure Laterality Date  . Carpal tunnel release    . Left knee surgery    . Cholecystectomy    . Partial hysterectomy      REVIEW OF SYSTEMS:  A comprehensive review of systems was negative.   PHYSICAL EXAMINATION: General appearance: alert, cooperative and no distress Head: Normocephalic, without obvious abnormality, atraumatic Neck: no adenopathy Lymph nodes: Cervical, supraclavicular, and axillary nodes normal. Resp: clear to auscultation bilaterally Cardio: regular rate and rhythm, S1, S2 normal, no murmur, click, rub or  gallop GI: soft, non-tender; bowel sounds normal; no masses,  no organomegaly Extremities: extremities normal, atraumatic, no cyanosis or edema  ECOG PERFORMANCE STATUS: 1 - Symptomatic but completely ambulatory  Blood pressure 144/79, pulse 89, temperature 96.7 F (35.9 C), temperature source Oral, resp. rate 18, height 5' 2.5" (1.588 m), weight 196 lb 1.6 oz (88.95 kg).  LABORATORY DATA: Lab Results  Component Value Date   WBC 8.5 10/06/2012   HGB 15.0 10/06/2012   HCT 44.8 10/06/2012   MCV 90.7 10/06/2012   PLT 215 10/06/2012      Chemistry      Component Value Date/Time   NA 143 10/06/2012 1303   NA 143 12/24/2011 1311   NA 138 09/06/2011 2145   K 4.4 10/06/2012 1303   K 4.7 12/24/2011 1311   K 4.3 09/06/2011 2145   CL 106 10/06/2012 1303   CL 99 12/24/2011 1311   CL 102 09/06/2011 2145   CO2 27 10/06/2012 1303   CO2 31 12/24/2011 1311   CO2 29 09/06/2011 2145   BUN 15.3 10/06/2012 1303   BUN 13 12/24/2011 1311   BUN 22 09/06/2011 2145   CREATININE 0.9 10/06/2012 1303   CREATININE 1.2 12/24/2011 1311   CREATININE 1.15* 09/06/2011 2145      Component Value Date/Time   CALCIUM 9.3 10/06/2012 1303   CALCIUM 10.2 12/24/2011 1311   CALCIUM 9.6 09/06/2011 2145   ALKPHOS 130 10/06/2012 1303   ALKPHOS 98* 12/24/2011 1311   ALKPHOS 103 07/30/2011 0053   AST 14 10/06/2012 1303   AST 18 12/24/2011 1311   AST 13 07/30/2011 0053   ALT 15 10/06/2012 1303   ALT 12 07/30/2011 0053   BILITOT 0.23 10/06/2012 1303   BILITOT 0.50 12/24/2011 1311   BILITOT 0.1* 07/30/2011 0053       RADIOGRAPHIC STUDIES: Ct Chest W Contrast  10/06/2012  *RADIOLOGY REPORT*  Clinical Data:  Lung cancer diagnosed June 2012, completed chemotherapy and radiation therapy.  Low back pain  CT CHEST, ABDOMEN AND PELVIS WITH CONTRAST  Technique:  Multidetector CT imaging of the chest, abdomen and pelvis was performed following the standard protocol during bolus administration of intravenous contrast.  Contrast: OMNIPAQUE IOHEXOL 300 MG/ML   SOLN  Comparison:  06/28/2012  CT CHEST  Findings:  Right-sided Port-A-Cath in place.  1.2 cm pretracheal lymph node image 21 is stable allowing for differences in technique.  Heart size is normal.  Moderate pericardial fluid is increased since previously, measuring fluid density.  Moderate atheromatous aortic calcification without aneurysm.  Right perihilar nodularity is decreased, now 1.3 cm image 24, previously 2.2 cm at same anatomic level.  Left perihilar linear pulmonary opacity most compatible with postradiation change is stable.  Increased confluent and extent of linear right lower lobe pulmonary opacity, also presumed to be post radiation change.  More focal nodularity within this region measuring 1.3 x 0.9 cm image 34 is not subjectively changed allowing for differences in  configuration.  No new pulmonary mass, nodule, or consolidation.  5 mm right upper lobe pulmonary nodule image 12 is stable.  Severe emphysematous changes are again identified.  IMPRESSION: Increased, now moderate, pericardial effusion.  Left perihilar and presumed right lower lobe postradiation change.  Decreased right perihilar presumed lymphadenopathy.  Stable pretracheal lymph node.  CT ABDOMEN AND PELVIS  Findings:  Hepatic hypodensity compatible steatosis again noted. No focal hepatic lesion. Cholecystectomy clips noted.  Kidneys, adrenal glands, and pancreas are normal.  Curvilinear left lateral splenic calcifications are unchanged.  Stable infrarenal aortic aneurysm measuring 4.9 centimeters in AP diameter image 67.  Extensive acentric are low density mural thrombus/plaque again noted.  No pelvic free fluid or lymphadenopathy.  Normal appendix and ovaries.  Uterus presumed surgically absent.  No bowel wall thickening or focal segmental dilatation.  No acute osseous finding or lytic or sclerotic osseous lesion. L2 compression deformity is stable.  IMPRESSION: No acute intra-abdominal or pelvic pathology or evidence for metastatic  disease.   Original Report Authenticated By: Christiana Pellant, M.D.    Ct Abdomen Pelvis W Contrast  10/06/2012  *RADIOLOGY REPORT*  Clinical Data:  Lung cancer diagnosed June 2012, completed chemotherapy and radiation therapy.  Low back pain  CT CHEST, ABDOMEN AND PELVIS WITH CONTRAST  Technique:  Multidetector CT imaging of the chest, abdomen and pelvis was performed following the standard protocol during bolus administration of intravenous contrast.  Contrast: OMNIPAQUE IOHEXOL 300 MG/ML  SOLN  Comparison:  06/28/2012  CT CHEST  Findings:  Right-sided Port-A-Cath in place.  1.2 cm pretracheal lymph node image 21 is stable allowing for differences in technique.  Heart size is normal.  Moderate pericardial fluid is increased since previously, measuring fluid density.  Moderate atheromatous aortic calcification without aneurysm.  Right perihilar nodularity is decreased, now 1.3 cm image 24, previously 2.2 cm at same anatomic level.  Left perihilar linear pulmonary opacity most compatible with postradiation change is stable.  Increased confluent and extent of linear right lower lobe pulmonary opacity, also presumed to be post radiation change.  More focal nodularity within this region measuring 1.3 x 0.9 cm image 34 is not subjectively changed allowing for differences in configuration.  No new pulmonary mass, nodule, or consolidation.  5 mm right upper lobe pulmonary nodule image 12 is stable.  Severe emphysematous changes are again identified.  IMPRESSION: Increased, now moderate, pericardial effusion.  Left perihilar and presumed right lower lobe postradiation change.  Decreased right perihilar presumed lymphadenopathy.  Stable pretracheal lymph node.  CT ABDOMEN AND PELVIS  Findings:  Hepatic hypodensity compatible steatosis again noted. No focal hepatic lesion. Cholecystectomy clips noted.  Kidneys, adrenal glands, and pancreas are normal.  Curvilinear left lateral splenic calcifications are unchanged.   Stable infrarenal aortic aneurysm measuring 4.9 centimeters in AP diameter image 67.  Extensive acentric are low density mural thrombus/plaque again noted.  No pelvic free fluid or lymphadenopathy.  Normal appendix and ovaries.  Uterus presumed surgically absent.  No bowel wall thickening or focal segmental dilatation.  No acute osseous finding or lytic or sclerotic osseous lesion. L2 compression deformity is stable.  IMPRESSION: No acute intra-abdominal or pelvic pathology or evidence for metastatic disease.   Original Report Authenticated By: Christiana Pellant, M.D.     ASSESSMENT: This is a very pleasant 62 years old white female with metastatic non-small cell lung cancer, adenocarcinoma status post stereotactic radiotherapy to brain lesions followed by concurrent chemoradiation as well as 3 cycles of systemic chemotherapy with  carboplatin and Alimta and has been observation since December 2012 with no significant evidence for disease progression compared to the previous scan 3 months ago.   PLAN: I discussed the scan results with the patient and her friend. I recommended for her to continue on observation with repeat CT scan of the chest, abdomen and pelvis in 3 months. Regarding the increasing size of the pericardial effusion, the patient is scheduled to see Dr. Sharyn Lull in 2 days. I gave her a copy of his scan and I referred my note to Dr. Sharyn Lull for consideration of echocardiogram for further evaluation of the pericardial effusion.  I also strongly one carries the patient to quit smoking and also in her smoke cessation program but unfortunately she is not willing to quit. She was advised to call me if she has any concerning symptoms in the interval.  All questions were answered. The patient knows to call the clinic with any problems, questions or concerns. We can certainly see the patient much sooner if necessary.

## 2012-10-11 NOTE — Telephone Encounter (Signed)
gv and printed appt sched and avs for pt....gv pt barium   °

## 2012-10-13 ENCOUNTER — Telehealth: Payer: Self-pay | Admitting: *Deleted

## 2012-10-13 ENCOUNTER — Encounter: Payer: Self-pay | Admitting: *Deleted

## 2012-10-13 NOTE — Telephone Encounter (Signed)
caled pt home left vm rx to be p/u at pharmacy

## 2012-10-13 NOTE — Telephone Encounter (Signed)
Called patient home left message for Rx to be p/u  norco 5/325mg , take 1-2 tab oral every 4-t hours as needed for pain,dispense#120, no refill, left voice message to have patient come p/u rx 8:56 AM

## 2012-10-13 NOTE — Telephone Encounter (Signed)
Called [facility] pharmacy in  [place], [phone],[id number] with pharmacist Jonnell, can call in [id number] 5/325mg , take 1-2 tabs oral every 4-6 hours as needed or pain,dispense 120 tabs ,no refills per Dr.Manning, pharmacy to call pt when ready,thanked pharmacist 9:25 AM

## 2012-10-20 ENCOUNTER — Telehealth: Payer: Self-pay | Admitting: Medical Oncology

## 2012-10-20 NOTE — Telephone Encounter (Signed)
Pt had US done and reported she has " a leaky valve but not much fluid around her heart" . I called Dr Annitta Jersey office and requested reports.

## 2012-11-01 ENCOUNTER — Telehealth: Payer: Self-pay | Admitting: Radiation Oncology

## 2012-11-01 NOTE — Telephone Encounter (Signed)
Per Dr. Broadus John order phoned in Norco 5/325 mg take 1-2 tablets by mouth every 4-6 hours prn pain, dispense 120 tablets, no refills. Spoke with Blanche East, pharmacist at [facility], [place] phone number [phone]. Phoned patient back to make her aware this had been done. Also, encouraged patient to keep appointment with Dr. Kathrynn Running on 11/08/2012. She verbalized understanding and expressed appreciation for the call. Patient confirms she will be present for follow up.

## 2012-11-05 ENCOUNTER — Ambulatory Visit
Admission: RE | Admit: 2012-11-05 | Discharge: 2012-11-05 | Disposition: A | Discharge status: Home / Self Care | Payer: Medicare Other | Source: Ambulatory Visit | Attending: Radiation Oncology | Admitting: Radiation Oncology

## 2012-11-05 DIAGNOSIS — C7931 Secondary malignant neoplasm of brain: Secondary | ICD-10-CM

## 2012-11-05 MED ORDER — GADOBENATE DIMEGLUMINE 529 MG/ML IV SOLN
18.0000 mL | Freq: Once | INTRAVENOUS | Status: AC | PRN
  Administered 2012-11-05: 18 mL via INTRAVENOUS

## 2012-11-07 ENCOUNTER — Encounter: Payer: Self-pay | Admitting: Radiation Oncology

## 2012-11-07 NOTE — Progress Notes (Signed)
Radiation Oncology         [phone] ________________________________  Name: Courtney West MRN: [id number]  Date: 11/08/2012  DOB: [birth year]  Follow-Up Visit Note  CC: [name], MD  [name], MD  Diagnosis:   62 year old woman with metastatic lung cancer status post:  1. SRS to a 10-mm left frontal and 5-mm right frontal brain metastasis to 20 Gy on January 16, 2011.  2. The patient's primary lung cancer was treated to 66 Gy in 33 fractions of 2 Gy through March 10, 2011 . 3. SRS to 6 new brain metastases (2 adjacent left temporal brain metastases, a 4.7-mm vermis target, a 3.4-mm right frontal brain metastasis, a left parietal 5.3-mm brain metastasis, and a 6.6-mm left cerebellar brain metastasis) received 20 Gy in a single fraction.April 18, 2011. 4. SRS to a 4 mm posterior frontal target to 20 Gy on 10/20/2011 5. SBRT to a 1.6 cm metachronous clinical stage IA primary cancer of the right lower lung on 01/19/12, 01/21/12, and 01/26/12 to 54 gray in 3 fractions of 18 gray   Interval Since Last Radiation:  9  months  Narrative:  The patient returns today for routine follow-up.  During the patient's last visit to our clinic in February, she conveyed a new philosophy towards her metastatic lung cancer. She would like to continue to monitor the disease progress but she would like to avoid any further aggressive interventions to treat the cancer whenever possible.                              ALLERGIES:  is allergic to carboplatin; codeine; dilaudid; penicillins; sulfa antibiotics; and dilaudid.  Meds: Current Outpatient Prescriptions  Medication Sig Dispense Refill  . amitriptyline (ELAVIL) 100 MG tablet Take 200 mg by mouth at bedtime.        Marland Kitchen atorvastatin (LIPITOR) 20 MG tablet Take 20 mg by mouth daily.      . budesonide-formoterol (SYMBICORT) 80-4.5 MCG/ACT inhaler Inhale 2 puffs into the lungs as needed.       . gabapentin (NEURONTIN) 300 MG capsule Take 2 capsules  (600 mg total) by mouth 3 (three) times daily.  180 capsule  2  . HYDROcodone-acetaminophen (NORCO/VICODIN) 5-325 MG per tablet TAKE ONE TO TWO TABLETS BY MOUTH EVERY 6 HOURS AS NEEDED FOR PAIN  60 tablet  0  . lidocaine-prilocaine (EMLA) cream Apply topically as needed.  30 g  0  . metoprolol succinate (TOPROL-XL) 50 MG 24 hr tablet Take 50 mg by mouth daily. Take with or immediately following a meal.      . pentoxifylline (TRENTAL) 400 MG CR tablet Take 1 tablet (400 mg total) by mouth 2 (two) times daily. 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 vitamin E 400 UNIT capsule Take 400 Units by mouth 2 (two) times daily.       No current facility-administered medications for this encounter.    Physical Findings: The patient is in no acute distress. Patient is alert and oriented.  weight is 191 lb 3.2 oz (86.728 kg). Her oral temperature is 98.4 F (36.9 C). Her blood pressure is 112/72 and her pulse is 76. Her respiration is 16. .  No significant changes.  Radiographic Findings: Mr [name] WU Contrast  11/05/2012   *RADIOLOGY REPORT*  Clinical Data: S R S 48-month restaging, metastatic non-small cell lung cancer, favoring  adenocarcinoma. No current Neuro symptoms.  MRI HEAD WITHOUT AND WITH CONTRAST  Technique:  Multiplanar, multiecho pulse sequences of the brain and surrounding structures were obtained according to standard protocol without and with intravenous contrast  Contrast: 18mL MULTIHANCE GADOBENATE DIMEGLUMINE 529 MG/ML IV SOLN  Comparison: Most recent 07/30/2012.  Findings: No acute stroke or hemorrhage.  No hydrocephalus or extra- axial fluid.  Mild atrophy.  Minor periventricular/callosal white matter changes, likely mild chronic microvascular ischemic change. Mild vasogenic edema surrounds the right frontal and left posterior frontal lesions.  Remote right cerebellar infarct.  Individual lesions are identified from inferior to superior on post  contrast T1-weighted images 1 mm thick, series 10 as follows:  - Image 37, left cerebellar subcortical white matter, 3 mm, worse. - Image 44, superficial left mid to posterior inferior temporal gyrus, 4 mm, stable. - Image 87, right anterior frontal subcortical white matter, 6 mm, worse. - Image 90, right frontal opercular subcortical white matter lesions, stable. - Image 104, left posterior frontal subcortical white matter, worse.  No calvarial lesions.  No vascular occlusion.  No acute sinus or mastoid fluid.  Negative orbits.  IMPRESSION: Overall five intracranial metastatic lesions are identified, with progression of three since 07/30/2012.   See discussion above.   Original Report Authenticated By: Davonna Belling, M.D.   Impression:  The patient has a history of stage IV non-small cell lung cancer with a history of multiple courses of stereotactic radiosurgery for brain metastases. Currently, she is 5 visible intracranial metastatic deposits with progression of 3 sites since February. The current sites of progressive disease are subcentimeter and likely asymptomatic.  She does not desire treatment.  Plan:  Repeat brain MRI in 3 months and followup  _____________________________________  Artist Pais. Kathrynn Running, M.D.

## 2012-11-08 ENCOUNTER — Encounter: Payer: Self-pay | Admitting: Radiation Oncology

## 2012-11-08 ENCOUNTER — Ambulatory Visit
Admission: RE | Admit: 2012-11-08 | Discharge: 2012-11-08 | Disposition: A | Discharge status: Home / Self Care | Payer: Medicare Other | Source: Ambulatory Visit | Attending: Radiation Oncology | Admitting: Radiation Oncology

## 2012-11-08 VITALS — BP 112/72 | HR 76 | Temp 98.4°F | Resp 16 | Wt 191.2 lb

## 2012-11-08 DIAGNOSIS — C719 Malignant neoplasm of brain, unspecified: Secondary | ICD-10-CM

## 2012-11-08 DIAGNOSIS — Z923 Personal history of irradiation: Secondary | ICD-10-CM

## 2012-11-08 MED ORDER — GABAPENTIN 300 MG PO CAPS: 600.0000 mg | ORAL_CAPSULE | Freq: Three times a day (TID) | ORAL | Status: DC

## 2012-11-08 MED ORDER — HYDROCODONE-ACETAMINOPHEN 5-325 MG PO TABS: ORAL_TABLET | ORAL | Status: DC

## 2012-11-08 NOTE — Progress Notes (Signed)
Patient presents to the clinic today unaccompanied for follow up with Dr. Kathrynn Running. Patient alert and oriented to person, place, and time. No distress noted. Steady gait noted. Pleasant affect noted. Patient reports chronic back pain. Patient reports occasional frontal headaches that resolve without medication. Patient reports occasional dizziness. Patient denies nausea, vomiting or ringing in the ears. Patient reports continued floaters. Patient reports occasionally she has difficulty findings her words but, responded quickly and appropriately to questioning. Patient denies slurred speech. Patient denies seizure activity. Patient reports taking trental and vitamin e as directed. Reported all findings to Dr. Kathrynn Running.

## 2012-11-16 ENCOUNTER — Telehealth: Payer: Self-pay | Admitting: Medical Oncology

## 2012-11-16 NOTE — Telephone Encounter (Signed)
Note left for pt to  pickup

## 2012-11-16 NOTE — Telephone Encounter (Signed)
Asking for note from Dr Arbutus Ped that pt is on oxygen. I called [facility] , Chelsie to see who ordered oxygen and she said it was ordered by Dr Arbutus Ped .

## 2012-11-19 ENCOUNTER — Telehealth: Payer: Self-pay | Admitting: Radiation Oncology

## 2012-11-19 ENCOUNTER — Other Ambulatory Visit: Payer: Self-pay | Admitting: Radiation Therapy

## 2012-11-19 DIAGNOSIS — C7931 Secondary malignant neoplasm of brain: Secondary | ICD-10-CM

## 2012-11-19 NOTE — Telephone Encounter (Signed)
Phoned Courtney West. She request that Dr. Kathrynn Running indicate on the letter she request that she can return to work May 23, 2013. Explained to patient this writer would call her back on Monday if the letter is ready for pick up.

## 2012-11-19 NOTE — Telephone Encounter (Signed)
Returned patient's call. Patient requesting that Dr. Kathrynn Running phone her. Explained Dr. Kathrynn Running is very busy today with under treat patients and offered to assist. Reluctantly, patient explains "they are attempting to foreclose on her trailer." She goes on to explain the foreclosure folks are requesting a letter from Dr. Kathrynn Running as to when the patient can return to work. She states when questioned, "I can go back to work if Dr. Kathrynn Running thinks that is best but, I have three to lesions." Patient states, "I need this paper by Monday." Explained to the patient this information would be relayed to Dr. Kathrynn Running and this writer would contact her with further direction as directed by Dr. Kathrynn Running.

## 2012-11-22 ENCOUNTER — Encounter: Payer: Self-pay | Admitting: Radiation Oncology

## 2012-11-22 ENCOUNTER — Telehealth: Payer: Self-pay | Admitting: Radiation Oncology

## 2012-11-22 NOTE — Telephone Encounter (Signed)
Phoned patient to inform her letter she requested from Dr. Kathrynn Running is ready for pick up. Patient verbalized understanding.

## 2012-11-22 NOTE — Telephone Encounter (Signed)
Per patient's request faxed work letter to patient's daughter's home fax machine at [phone]. Confirmation fax obtained.

## 2012-11-22 NOTE — Progress Notes (Unsigned)
  Radiation Oncology         [phone] ________________________________  Name: ESTHER BRADSTREET  MRN: [id number]  Date: 11/22/2012  DOB: [birth year]  To whom it may concern:  Kassidee Propps is a patient under my care with stage IV lung cancer.  Her cancer has spread to her brain and caused some disability.  She has been unable to work.  If she remains stable, she will be released to return to work on December 1st, 2014.  If you have any questions, please contact me.  Sincerely,  Artist Pais. Kathrynn Running, M.D.

## 2012-11-23 ENCOUNTER — Telehealth: Payer: Self-pay | Admitting: Radiation Oncology

## 2012-11-23 NOTE — Telephone Encounter (Signed)
Patient phoned again questioning if requested paperwork with May 23, 2012 was ready. Explained to the patient it is not. Explained Dr. Kathrynn Running is out of the office today but, will return tomorrow. Will contact patient once correction is made. Patient verbalized understanding.

## 2012-11-24 ENCOUNTER — Telehealth: Payer: Self-pay | Admitting: Radiation Oncology

## 2012-11-24 NOTE — Telephone Encounter (Signed)
Faxed corrected paperwork as requested by patient to fax number [phone]. Confirmation fax obtained. Phoned patient making her aware of these actions.

## 2012-12-10 ENCOUNTER — Other Ambulatory Visit: Payer: Self-pay | Admitting: Radiation Oncology

## 2012-12-10 NOTE — Telephone Encounter (Signed)
As ordered by Dr. Kathrynn Running called in hydrocodone actaminophen 5/325 mg script and gabapentin 600 mg tid to [place] at [facility] in [place]. Phoned patient to make her aware this has been done. She verbalized understanding.

## 2012-12-21 ENCOUNTER — Telehealth: Payer: Self-pay | Admitting: *Deleted

## 2012-12-21 NOTE — Telephone Encounter (Signed)
Pt called requesting a refill on a rx Dr Kathrynn Running had refilled on 6/20 for hydrocodone.  Attempted to call pt back to ask Dr Kathrynn Running for refill since he appears to be overseeing her rx.  Called return ph# multiple times, unable to leave msg.  SLJ

## 2012-12-29 ENCOUNTER — Other Ambulatory Visit: Payer: Self-pay | Admitting: Radiation Oncology

## 2012-12-29 DIAGNOSIS — C719 Malignant neoplasm of brain, unspecified: Secondary | ICD-10-CM

## 2012-12-29 DIAGNOSIS — Z923 Personal history of irradiation: Secondary | ICD-10-CM

## 2012-12-29 MED ORDER — GABAPENTIN 300 MG PO CAPS: 600.0000 mg | ORAL_CAPSULE | Freq: Three times a day (TID) | ORAL | Status: DC

## 2012-12-29 MED ORDER — HYDROCODONE-ACETAMINOPHEN 5-325 MG PO TABS: 2.0000 | ORAL_TABLET | Freq: Four times a day (QID) | ORAL | Status: DC | PRN

## 2012-12-30 ENCOUNTER — Telehealth: Payer: Self-pay | Admitting: Radiation Oncology

## 2012-12-30 NOTE — Telephone Encounter (Signed)
Per Dr. Broadus John order phoned in hydrocodone-acetaminophen 5/325 mg. Spoke with Karolee Stamps, Teacher, early years/pre, at [employer] in [place]. Requested the patient take two tablets by mouth every 6 hours prn pain. Also, phoned patient to make her aware medication is ready for pick up.

## 2013-01-10 ENCOUNTER — Ambulatory Visit (HOSPITAL_COMMUNITY)
Admission: RE | Admit: 2013-01-10 | Discharge: 2013-01-10 | Disposition: A | Discharge status: Home / Self Care | Payer: Medicare Other | Source: Ambulatory Visit | Attending: Internal Medicine | Admitting: Internal Medicine

## 2013-01-10 ENCOUNTER — Other Ambulatory Visit (HOSPITAL_BASED_OUTPATIENT_CLINIC_OR_DEPARTMENT_OTHER): Payer: Medicare Other | Admitting: Lab

## 2013-01-10 ENCOUNTER — Encounter (HOSPITAL_COMMUNITY): Payer: Self-pay

## 2013-01-10 ENCOUNTER — Other Ambulatory Visit (HOSPITAL_COMMUNITY): Payer: Medicare Other

## 2013-01-10 DIAGNOSIS — I319 Disease of pericardium, unspecified: Secondary | ICD-10-CM | POA: Insufficient documentation

## 2013-01-10 DIAGNOSIS — C3490 Malignant neoplasm of unspecified part of unspecified bronchus or lung: Secondary | ICD-10-CM | POA: Insufficient documentation

## 2013-01-10 DIAGNOSIS — I2510 Atherosclerotic heart disease of native coronary artery without angina pectoris: Secondary | ICD-10-CM | POA: Insufficient documentation

## 2013-01-10 DIAGNOSIS — I708 Atherosclerosis of other arteries: Secondary | ICD-10-CM | POA: Insufficient documentation

## 2013-01-10 DIAGNOSIS — I714 Abdominal aortic aneurysm, without rupture, unspecified: Secondary | ICD-10-CM | POA: Insufficient documentation

## 2013-01-10 DIAGNOSIS — Z9221 Personal history of antineoplastic chemotherapy: Secondary | ICD-10-CM | POA: Insufficient documentation

## 2013-01-10 DIAGNOSIS — C7931 Secondary malignant neoplasm of brain: Secondary | ICD-10-CM | POA: Insufficient documentation

## 2013-01-10 DIAGNOSIS — K4020 Bilateral inguinal hernia, without obstruction or gangrene, not specified as recurrent: Secondary | ICD-10-CM | POA: Insufficient documentation

## 2013-01-10 DIAGNOSIS — M439 Deforming dorsopathy, unspecified: Secondary | ICD-10-CM | POA: Insufficient documentation

## 2013-01-10 DIAGNOSIS — J438 Other emphysema: Secondary | ICD-10-CM | POA: Insufficient documentation

## 2013-01-10 DIAGNOSIS — R599 Enlarged lymph nodes, unspecified: Secondary | ICD-10-CM | POA: Insufficient documentation

## 2013-01-10 DIAGNOSIS — Z923 Personal history of irradiation: Secondary | ICD-10-CM | POA: Insufficient documentation

## 2013-01-10 DIAGNOSIS — K7689 Other specified diseases of liver: Secondary | ICD-10-CM | POA: Insufficient documentation

## 2013-01-10 LAB — COMPREHENSIVE METABOLIC PANEL (CC13)
ALT: 29 U/L (ref 0–55)
AST: 19 U/L (ref 5–34)
CO2: 26 mEq/L (ref 22–29)
Calcium: 9.7 mg/dL (ref 8.4–10.4)
Chloride: 104 mEq/L (ref 98–109)
Potassium: 4.4 mEq/L (ref 3.5–5.1)
Sodium: 141 mEq/L (ref 136–145)
Total Protein: 7.5 g/dL (ref 6.4–8.3)

## 2013-01-10 LAB — CBC WITH DIFFERENTIAL/PLATELET
BASO%: 1 % (ref 0.0–2.0)
EOS%: 1.9 % (ref 0.0–7.0)
HCT: 47.7 % — ABNORMAL HIGH (ref 34.8–46.6)
LYMPH%: 20.1 % (ref 14.0–49.7)
MCH: 31.5 pg (ref 25.1–34.0)
MCHC: 34 g/dL (ref 31.5–36.0)
MONO#: 0.5 10*3/uL (ref 0.1–0.9)
NEUT%: 71.2 % (ref 38.4–76.8)
Platelets: 184 10*3/uL (ref 145–400)
RBC: 5.15 10*6/uL (ref 3.70–5.45)
WBC: 8.8 10*3/uL (ref 3.9–10.3)
lymph#: 1.8 10*3/uL (ref 0.9–3.3)

## 2013-01-10 MED ORDER — IOHEXOL 300 MG/ML  SOLN
100.0000 mL | Freq: Once | INTRAMUSCULAR | Status: AC | PRN
  Administered 2013-01-10: 100 mL via INTRAVENOUS

## 2013-01-12 ENCOUNTER — Encounter: Payer: Self-pay | Admitting: Internal Medicine

## 2013-01-12 ENCOUNTER — Telehealth: Payer: Self-pay | Admitting: Internal Medicine

## 2013-01-12 ENCOUNTER — Ambulatory Visit (HOSPITAL_BASED_OUTPATIENT_CLINIC_OR_DEPARTMENT_OTHER): Payer: Medicare Other | Admitting: Internal Medicine

## 2013-01-12 VITALS — BP 132/87 | HR 80 | Temp 97.1°F | Resp 18 | Ht 62.0 in | Wt 188.0 lb

## 2013-01-12 DIAGNOSIS — C3490 Malignant neoplasm of unspecified part of unspecified bronchus or lung: Secondary | ICD-10-CM

## 2013-01-12 NOTE — Patient Instructions (Signed)
Your scan showed no significant evidence for disease progression.  Followup visit in 3 months with repeat CT scan of the chest, abdomen and pelvis.

## 2013-01-12 NOTE — Telephone Encounter (Signed)
gv and printed avs for pt °

## 2013-01-12 NOTE — Progress Notes (Signed)
[facility] [phone]   [phone]  OFFICE PROGRESS NOTE  Courtney Blamer, MD [facility] [address] [place] [address]  DIAGNOSIS: Metastatic non-small cell lung cancer, favoring adenocarcinoma diagnosed in July of 2012, presented with locally advanced disease in the chest as well as brain metastasis. Veristrat test poor.   PRIOR THERAPY:  1. Status post stereotactic radiotherapy to 2 brain lesions under the care of Dr. Mitzi Hansen. 2. Status post a course of concurrent chemoradiation with weekly carboplatin and paclitaxel, last dose of chemotherapy was given 03/05/2011. 3. Systemic chemotherapy with carboplatin for AUC of 5 and Alimta 500 mg/M2. The patient is status post 3 cycles. Last dose was given 06/03/2011  CURRENT THERAPY: Observation.   INTERVAL HISTORY: Courtney West 62 y.o. female returns to the clinic today for followup visit. The patient is feeling fine today with no specific complaints except for mild shortness breath with exertion. She denied having any significant chest pain, cough or hemoptysis. The patient denied having any significant weight loss or night sweats. She has no nausea or vomiting. The patient has repeat CT scan of the chest, abdomen and pelvis performed recently and she is here for evaluation and discussion of her scan results.  MEDICAL HISTORY: Past Medical History  Diagnosis Date  . Hypertension   . COPD (chronic obstructive pulmonary disease)   . Stroke   . Abdominal aortic aneurysm   . Dyslipidemia   . S/P radiation therapy 7/12 thru 9/12, 04/18/11    xrt to brain mets  . Lung cancer 12/2010  . Brain cancer     mets from lung primary  . Allergy     codeine,pcn,sulfa drugs  . Radiation 10/20/2011    frontal mets/Palliation  . History of radiation therapy 01/19/12, 01/21/12, 01/26/12    RLL lung  . History of radiation therapy 04/18/11    single fraction to 6 brain mets  .  Clotted vascular catheter 03/30/2012  . Lung cancer 12/22/2010    ALLERGIES:  is allergic to carboplatin; codeine; dilaudid; penicillins; sulfa antibiotics; and dilaudid.  MEDICATIONS:  Current Outpatient Prescriptions  Medication Sig Dispense Refill  . amitriptyline (ELAVIL) 100 MG tablet Take 200 mg by mouth at bedtime.        Marland Kitchen atorvastatin (LIPITOR) 20 MG tablet Take 20 mg by mouth daily.      . budesonide-formoterol (SYMBICORT) 80-4.5 MCG/ACT inhaler Inhale 2 puffs into the lungs as needed.       . gabapentin (NEURONTIN) 300 MG capsule Take 2 capsules (600 mg total) by mouth 3 (three) times daily.  180 capsule  2  . HYDROcodone-acetaminophen (NORCO/VICODIN) 5-325 MG per tablet Take 2 tablets by mouth every 6 (six) hours as needed for pain.  90 tablet  2  . lidocaine-prilocaine (EMLA) cream Apply topically as needed.  30 g  0  . metoprolol succinate (TOPROL-XL) 50 MG 24 hr tablet Take 50 mg by mouth daily. Take with or immediately following a meal.       No current facility-administered medications for this visit.    SURGICAL HISTORY:  Past Surgical History  Procedure Laterality Date  . Carpal tunnel release    . Left knee surgery    . Cholecystectomy    . Partial hysterectomy      REVIEW OF SYSTEMS:  A comprehensive review of systems was negative except for: Respiratory: positive for dyspnea on exertion   PHYSICAL EXAMINATION: General appearance: alert,  cooperative and no distress Head: Normocephalic, without obvious abnormality, atraumatic Neck: no adenopathy Lymph nodes: Cervical, supraclavicular, and axillary nodes normal. Resp: clear to auscultation bilaterally Cardio: regular rate and rhythm, S1, S2 normal, no murmur, click, rub or gallop GI: soft, non-tender; bowel sounds normal; no masses,  no organomegaly Extremities: extremities normal, atraumatic, no cyanosis or edema  ECOG PERFORMANCE STATUS: 1 - Symptomatic but completely ambulatory  Blood pressure 132/87,  pulse 80, temperature 97.1 F (36.2 C), temperature source Oral, resp. rate 18, height 5\' 2"  (1.575 m), weight 188 lb (85.276 kg).  LABORATORY DATA: Lab Results  Component Value Date   WBC 8.8 01/10/2013   HGB 16.2* 01/10/2013   HCT 47.7* 01/10/2013   MCV 92.5 01/10/2013   PLT 184 01/10/2013      Chemistry      Component Value Date/Time   NA 141 01/10/2013 1220   NA 143 12/24/2011 1311   NA 138 09/06/2011 2145   K 4.4 01/10/2013 1220   K 4.7 12/24/2011 1311   K 4.3 09/06/2011 2145   CL 106 10/06/2012 1303   CL 99 12/24/2011 1311   CL 102 09/06/2011 2145   CO2 26 01/10/2013 1220   CO2 31 12/24/2011 1311   CO2 29 09/06/2011 2145   BUN 11.0 01/10/2013 1220   BUN 13 12/24/2011 1311   BUN 22 09/06/2011 2145   CREATININE 0.8 01/10/2013 1220   CREATININE 1.2 12/24/2011 1311   CREATININE 1.15* 09/06/2011 2145      Component Value Date/Time   CALCIUM 9.7 01/10/2013 1220   CALCIUM 10.2 12/24/2011 1311   CALCIUM 9.6 09/06/2011 2145   ALKPHOS 135 01/10/2013 1220   ALKPHOS 98* 12/24/2011 1311   ALKPHOS 103 07/30/2011 0053   AST 19 01/10/2013 1220   AST 18 12/24/2011 1311   AST 13 07/30/2011 0053   ALT 29 01/10/2013 1220   ALT 12 07/30/2011 0053   BILITOT 0.20 01/10/2013 1220   BILITOT 0.50 12/24/2011 1311   BILITOT 0.1* 07/30/2011 0053       RADIOGRAPHIC STUDIES: Ct Chest W Contrast  01/10/2013   *RADIOLOGY REPORT*  Clinical Data:  Metastatic lung cancer to the brain.  Chemotherapy and radiation therapy complete.  CT CHEST, ABDOMEN AND PELVIS WITH CONTRAST  Technique: Contiguous axial images of the chest abdomen and pelvis were obtained after IV contrast administration.  Contrast: 100  ml Omnipaque-300  Comparison: 10/06/2012    CT CHEST  Findings: Lung windows demonstrate mild minimal motion degradation. Moderate to severe centrilobular emphysema.  Slight increase in probable scarring at the right lung base, including on image 40/series 5. Persistent peribronchovascular interstitial radiation change in the right lower  lobe. Posterior right upper lobe subpleural nodule measures 6 mm image 14/series 5.  5 mm on the prior.  Slight difference favored to be due to differences in slice selection.  Azygos fissure. Redemonstration of left paramediastinal/perihilar radiation change. No evidence of residual or recurrent tumor in this area.  Soft tissue windows demonstrate no supraclavicular adenopathy. Right-sided Port-A-Cath which terminates at the low SVC.  Bovine arch.  Moderate atherosclerosis within the origin of the right subclavian artery.  Normal heart size with slight increasing moderate pericardial effusion.  Trace bilateral pleural fluid which is new.  Coronary artery atherosclerosis. No central pulmonary embolism, on this non-dedicated study.  1.2 cm low right paratracheal node is unchanged.  Other smaller middle mediastinal nodes are similar. Right infrahilar node measures 1.3 cm on image 31/series two and is similar to on  the prior exam. Right hilar node is also unchanged 1.3 cm.  IMPRESSION:  1.  Similar radiation changes bilaterally.  No evidence of recurrent pulmonary nodule or mass. 2.  Increasing moderate pericardial effusion; development of trace bilateral pleural fluid. 3.  Similar right-sided mediastinal and hilar adenopathy.    CT ABDOMEN AND PELVIS  Findings:  Moderate hepatic steatosis, without focal liver lesion. Similar mild hepatomegaly.  Atypical but similar splenic morphology, favored to be within normal variation.  Normal stomach, pancreas, biliary tract.  Cholecystectomy.  Normal adrenal glands and kidneys.  Circumaortic left renal vein.  Pararenal abdominal aortic aneurysm. This is similar at maximally 4.9 cm.  No surrounding hemorrhage. No retroperitoneal or retrocrural adenopathy.  Normal colon, appendix, and terminal ileum.  Normal small bowel without abdominal ascites.    No pelvic adenopathy.    Normal urinary bladder.  Hysterectomy. No adnexal mass.  No significant free fluid.  Bilateral tiny fat  containing inguinal hernias.  Similar appearance of moderate to severe L2 compression deformity with mild focal kyphosis centered about this area.  IMPRESSION:  1. No acute process or evidence of metastatic disease in the abdomen/pelvis. 2.  Hepatic steatosis. 3.  Similar abdominal aortic aneurysm.   Original Report Authenticated By: Jeronimo Greaves, M.D.   ASSESSMENT AND PLAN: This is a very pleasant 62 years old white female with metastatic non-small cell lung cancer status post systemic chemotherapy with carboplatin and Alimta and has been observation since December of 2012 with no significant evidence for disease progression. I discussed the scan results with the patient today. I recommended for her to continue on observation with repeat CT scan of the chest, abdomen and pelvis in 3 months. The patient will continue her routine followup visit and evaluation by Dr. Kathrynn Running for the metastatic brain lesions. She was advised to call immediately if she has any concerning symptoms in the interval.  All questions were answered. The patient knows to call the clinic with any problems, questions or concerns. We can certainly see the patient much sooner if necessary.

## 2013-01-21 ENCOUNTER — Other Ambulatory Visit: Payer: Self-pay | Admitting: Radiation Oncology

## 2013-01-24 ENCOUNTER — Telehealth: Payer: Self-pay | Admitting: Radiation Oncology

## 2013-01-24 NOTE — Telephone Encounter (Signed)
Phoned patient making her aware this prescription had been phoned into [facility]. Patient verbalized understanding and expressed appreciation for the call.

## 2013-01-24 NOTE — Telephone Encounter (Signed)
Per Dr. Broadus John order called in Hydrocodone-acetaminophen 5/325 mg. Take two tablets by mouth every six hours prn pain. Qty:90. No refills. Spoke with Cassie, pharmacist, at [facility].

## 2013-02-15 ENCOUNTER — Telehealth: Payer: Self-pay | Admitting: Radiation Oncology

## 2013-02-15 ENCOUNTER — Ambulatory Visit
Admission: RE | Admit: 2013-02-15 | Discharge: 2013-02-15 | Disposition: A | Discharge status: Home / Self Care | Payer: Medicare Other | Source: Ambulatory Visit | Attending: Radiation Oncology | Admitting: Radiation Oncology

## 2013-02-15 DIAGNOSIS — C7931 Secondary malignant neoplasm of brain: Secondary | ICD-10-CM

## 2013-02-15 MED ORDER — GADOBENATE DIMEGLUMINE 529 MG/ML IV SOLN: 17.0000 mL | Freq: Once | INTRAVENOUS | Status: AC | PRN

## 2013-02-15 NOTE — Telephone Encounter (Signed)
Per Dr. Broadus John order called in Hydrocodone-acetaminophen 5/325 mg. Take two tablets by mouth every six hours prn pain. Qty:90. No refills.

## 2013-02-15 NOTE — Telephone Encounter (Signed)
Phoned patient to make her aware this prescription had been phoned into [facility] pharmacy. No answer. Left message with my return contact number.

## 2013-02-25 ENCOUNTER — Other Ambulatory Visit: Payer: Self-pay | Admitting: Radiation Therapy

## 2013-02-25 DIAGNOSIS — C7931 Secondary malignant neoplasm of brain: Secondary | ICD-10-CM

## 2013-03-28 ENCOUNTER — Other Ambulatory Visit: Payer: Self-pay | Admitting: Radiation Oncology

## 2013-03-28 ENCOUNTER — Telehealth: Payer: Self-pay | Admitting: Radiation Oncology

## 2013-03-28 DIAGNOSIS — C719 Malignant neoplasm of brain, unspecified: Secondary | ICD-10-CM

## 2013-03-28 DIAGNOSIS — C3490 Malignant neoplasm of unspecified part of unspecified bronchus or lung: Secondary | ICD-10-CM

## 2013-03-28 MED ORDER — HYDROCODONE-ACETAMINOPHEN 5-325 MG PO TABS: ORAL_TABLET | ORAL | Status: DC

## 2013-03-28 NOTE — Telephone Encounter (Signed)
Phoned patient to discuss hydrocodone refill. No answer. No option to leave message. Will attempt to contact patient again tomorrow.

## 2013-03-28 NOTE — Telephone Encounter (Signed)
Message copied by Agnes Lawrence on Mon Mar 28, 2013  4:48 PM ------      Message from: [place], [place]      Created: Mon Mar 28, 2013  4:05 PM      Regarding: RE: Refill request      Contact: [phone]       Please refill.  I know Hydrocodone is changing to a non-phone refill class of opioids, so, I printed Rx just in case.      MM                  ----- Message -----         From: Agnes Lawrence, RN         Sent: 03/28/2013   2:35 PM           To: Oneita Hurt, MD      Subject: Refill request                                           Dr. Kathrynn Running.            Courtney West is requesting her hydrocodone/acetaminophen be refilled. Records indicate you filled it last on 01/23/2013.             Dose, Route, Frequency: As Directed         Dispense Quantity:  90 tablet Refills:  0 Fills Remaining:  0                       Sig: TAKE TWO TABLETS BY MOUTH EVERY 6 HOURS AS NEEDED FOR PAIN                     Written Date:  01/23/13 Expiration Date:  07/22/13           Start Date:  01/21/13 End Date:  --           Ordering Provider:  Oneita Hurt, MD Authorizing Provider:  Oneita Hurt, MD Ordering User:  Oneita Hurt, MD                                              Original Order:  HYDROcodone-acetaminophen (NORCO/VICODIN) 5-325 MG per tablet [[id number]]                   Pharmacy:  [id number] - [place], [place] - [address] Justus Memory, RN       ------

## 2013-03-29 ENCOUNTER — Telehealth: Payer: Self-pay | Admitting: Radiation Oncology

## 2013-03-29 NOTE — Telephone Encounter (Signed)
Phoned patient. Informed her medication prescription is ready for pick up. Requested she bring her license with her so that the prescription could be signed out. Apologized that the laws had changed and this writer could no longer phone in the prescription. Patient verbalized understanding and expressed appreciation for the call.

## 2013-04-13 ENCOUNTER — Other Ambulatory Visit (HOSPITAL_BASED_OUTPATIENT_CLINIC_OR_DEPARTMENT_OTHER): Payer: Medicare Other | Admitting: Lab

## 2013-04-13 ENCOUNTER — Encounter (HOSPITAL_COMMUNITY): Payer: Self-pay

## 2013-04-13 ENCOUNTER — Ambulatory Visit (HOSPITAL_COMMUNITY)
Admission: RE | Admit: 2013-04-13 | Discharge: 2013-04-13 | Disposition: A | Discharge status: Home / Self Care | Payer: Medicare Other | Source: Ambulatory Visit | Attending: Internal Medicine | Admitting: Internal Medicine

## 2013-04-13 ENCOUNTER — Encounter (INDEPENDENT_AMBULATORY_CARE_PROVIDER_SITE_OTHER): Payer: Self-pay

## 2013-04-13 DIAGNOSIS — D7389 Other diseases of spleen: Secondary | ICD-10-CM | POA: Insufficient documentation

## 2013-04-13 DIAGNOSIS — C3490 Malignant neoplasm of unspecified part of unspecified bronchus or lung: Secondary | ICD-10-CM

## 2013-04-13 DIAGNOSIS — C7931 Secondary malignant neoplasm of brain: Secondary | ICD-10-CM | POA: Insufficient documentation

## 2013-04-13 DIAGNOSIS — I319 Disease of pericardium, unspecified: Secondary | ICD-10-CM | POA: Insufficient documentation

## 2013-04-13 DIAGNOSIS — J438 Other emphysema: Secondary | ICD-10-CM | POA: Insufficient documentation

## 2013-04-13 DIAGNOSIS — I714 Abdominal aortic aneurysm, without rupture, unspecified: Secondary | ICD-10-CM | POA: Insufficient documentation

## 2013-04-13 LAB — COMPREHENSIVE METABOLIC PANEL
ALT: 13 U/L (ref 0–35)
AST: 15 U/L (ref 0–37)
BUN: 22 mg/dL (ref 6–23)
CO2: 29 mEq/L (ref 19–32)
Calcium: 9.9 mg/dL (ref 8.4–10.5)
Chloride: 98 mEq/L (ref 96–112)
Creatinine, Ser: 0.87 mg/dL (ref 0.50–1.10)
Total Bilirubin: 0.3 mg/dL (ref 0.3–1.2)

## 2013-04-13 LAB — CBC WITH DIFFERENTIAL/PLATELET
Basophils Absolute: 0.1 10*3/uL (ref 0.0–0.1)
EOS%: 3.4 % (ref 0.0–7.0)
HCT: 46.9 % — ABNORMAL HIGH (ref 34.8–46.6)
HGB: 15.8 g/dL (ref 11.6–15.9)
MCH: 31.2 pg (ref 25.1–34.0)
MCV: 92.7 fL (ref 79.5–101.0)
NEUT#: 6.4 10*3/uL (ref 1.5–6.5)
NEUT%: 64.5 % (ref 38.4–76.8)
WBC: 9.9 10*3/uL (ref 3.9–10.3)
lymph#: 2.3 10*3/uL (ref 0.9–3.3)

## 2013-04-13 MED ORDER — IOHEXOL 300 MG/ML  SOLN
100.0000 mL | Freq: Once | INTRAMUSCULAR | Status: AC | PRN
  Administered 2013-04-13: 100 mL via INTRAVENOUS

## 2013-04-14 ENCOUNTER — Ambulatory Visit (HOSPITAL_BASED_OUTPATIENT_CLINIC_OR_DEPARTMENT_OTHER): Payer: Medicare Other | Admitting: Internal Medicine

## 2013-04-14 ENCOUNTER — Encounter: Payer: Self-pay | Admitting: *Deleted

## 2013-04-14 ENCOUNTER — Encounter: Payer: Self-pay | Admitting: Internal Medicine

## 2013-04-14 DIAGNOSIS — C7931 Secondary malignant neoplasm of brain: Secondary | ICD-10-CM

## 2013-04-14 DIAGNOSIS — C3430 Malignant neoplasm of lower lobe, unspecified bronchus or lung: Secondary | ICD-10-CM

## 2013-04-14 MED ORDER — LIDOCAINE-PRILOCAINE 2.5-2.5 % EX CREA: TOPICAL_CREAM | CUTANEOUS | Status: DC | PRN

## 2013-04-14 NOTE — Patient Instructions (Signed)
Followup visit in 4 months with repeat CT scan of the chest, abdomen and pelvis. 

## 2013-04-14 NOTE — Progress Notes (Signed)
Oxygen re-certification saturation qualifications:  Pt Saturation on RA at Rest:  91% Pt Saturation on RA while ambulating 87% Pt saturation on 2 L O2 while ambulating: 93%

## 2013-04-14 NOTE — Progress Notes (Signed)
[facility] [phone]) [phone]   Fax:([phone]) [phone]  OFFICE PROGRESS NOTE  [name], MD [address] [place] [address]  DIAGNOSIS: Metastatic non-small cell lung cancer, favoring adenocarcinoma diagnosed in July of 2012, presented with locally advanced disease in the chest as well as brain metastasis. Veristrat test poor.   PRIOR THERAPY:  1. Status post stereotactic radiotherapy to 2 brain lesions under the care of Dr. Mitzi Hansen. 2. Status post a course of concurrent chemoradiation with weekly carboplatin and paclitaxel, last dose of chemotherapy was given 03/05/2011. 3. Systemic chemotherapy with carboplatin for AUC of 5 and Alimta 500 mg/M2. The patient is status post 3 cycles. Last dose was given 06/03/2011  CURRENT THERAPY: Observation.  INTERVAL HISTORY: Courtney West 62 y.o. female returns to the clinic today for routine three-month followup visit. The patient has no complaints today. She denied having any significant chest pain, shortness of breath, cough or hemoptysis. She has no weight loss or night sweats. Unfortunately she continues to smoke even after several consolidation in the past about the importance to quit smoking. The patient had repeat CT scan of the chest, abdomen and pelvis performed recently and she is here for evaluation and discussion of her scan results.  MEDICAL HISTORY: Past Medical History  Diagnosis Date  . Hypertension   . COPD (chronic obstructive pulmonary disease)   . Stroke   . Abdominal aortic aneurysm   . Dyslipidemia   . S/P radiation therapy 7/12 thru 9/12, 04/18/11    xrt to brain mets  . Lung cancer 12/2010  . Brain cancer     mets from lung primary  . Allergy     codeine,pcn,sulfa drugs  . Radiation 10/20/2011    frontal mets/Palliation  . History of radiation therapy 01/19/12, 01/21/12, 01/26/12    RLL lung  . History of radiation therapy 04/18/11    single fraction to 6 brain mets  .  Clotted vascular catheter 03/30/2012  . Lung cancer 12/22/2010    ALLERGIES:  is allergic to carboplatin; codeine; dilaudid; penicillins; sulfa antibiotics; and dilaudid.  MEDICATIONS:  Current Outpatient Prescriptions  Medication Sig Dispense Refill  . amitriptyline (ELAVIL) 100 MG tablet Take 200 mg by mouth at bedtime.        . budesonide-formoterol (SYMBICORT) 80-4.5 MCG/ACT inhaler Inhale 2 puffs into the lungs as needed.       . gabapentin (NEURONTIN) 300 MG capsule Take 2 capsules (600 mg total) by mouth 3 (three) times daily.  180 capsule  2  . lidocaine-prilocaine (EMLA) cream Apply topically as needed.  30 g  0   No current facility-administered medications for this visit.    SURGICAL HISTORY:  Past Surgical History  Procedure Laterality Date  . Carpal tunnel release    . Left knee surgery    . Cholecystectomy    . Partial hysterectomy      REVIEW OF SYSTEMS:  A comprehensive review of systems was negative except for: Constitutional: positive for fatigue Respiratory: positive for dyspnea on exertion   PHYSICAL EXAMINATION: General appearance: alert, cooperative, fatigued and no distress Head: Normocephalic, without obvious abnormality, atraumatic Neck: no adenopathy, no JVD, supple, symmetrical, trachea midline and thyroid not enlarged, symmetric, no tenderness/mass/nodules Lymph nodes: Cervical, supraclavicular, and axillary nodes normal. Resp: clear to auscultation bilaterally Back: symmetric, no curvature. ROM normal. No CVA tenderness. Cardio: regular rate and rhythm, S1, S2 normal, no murmur, click, rub or gallop GI: soft, non-tender; bowel  sounds normal; no masses,  no organomegaly Extremities: extremities normal, atraumatic, no cyanosis or edema  ECOG PERFORMANCE STATUS: 1 - Symptomatic but completely ambulatory  Blood pressure 106/58, pulse 81, temperature 97.9 F (36.6 C), temperature source Oral, resp. rate 20, height 5\' 2"  (1.575 m), weight 184 lb 1.6 oz  (83.507 kg).  LABORATORY DATA: Lab Results  Component Value Date   WBC 9.9 04/13/2013   HGB 15.8 04/13/2013   HCT 46.9* 04/13/2013   MCV 92.7 04/13/2013   PLT 200 04/13/2013      Chemistry      Component Value Date/Time   NA 135 04/13/2013 1213   NA 141 01/10/2013 1220   NA 143 12/24/2011 1311   K 3.9 04/13/2013 1213   K 4.4 01/10/2013 1220   K 4.7 12/24/2011 1311   CL 98 04/13/2013 1213   CL 106 10/06/2012 1303   CL 99 12/24/2011 1311   CO2 29 04/13/2013 1213   CO2 26 01/10/2013 1220   CO2 31 12/24/2011 1311   BUN 22 04/13/2013 1213   BUN 11.0 01/10/2013 1220   BUN 13 12/24/2011 1311   CREATININE 0.87 04/13/2013 1213   CREATININE 0.8 01/10/2013 1220   CREATININE 1.2 12/24/2011 1311      Component Value Date/Time   CALCIUM 9.9 04/13/2013 1213   CALCIUM 9.7 01/10/2013 1220   CALCIUM 10.2 12/24/2011 1311   ALKPHOS 96 04/13/2013 1213   ALKPHOS 135 01/10/2013 1220   ALKPHOS 98* 12/24/2011 1311   AST 15 04/13/2013 1213   AST 19 01/10/2013 1220   AST 18 12/24/2011 1311   ALT 13 04/13/2013 1213   ALT 29 01/10/2013 1220   ALT 18 12/24/2011 1311   BILITOT 0.3 04/13/2013 1213   BILITOT 0.20 01/10/2013 1220   BILITOT 0.50 12/24/2011 1311       RADIOGRAPHIC STUDIES: Ct Chest W Contrast  04/13/2013   CLINICAL DATA:  Lung cancer with brain Mets. Prior gallbladder resection.  EXAM: CT CHEST, ABDOMEN, AND PELVIS WITH CONTRAST  TECHNIQUE: Multidetector CT imaging of the chest, abdomen and pelvis was performed following the standard protocol during bolus administration of intravenous contrast.  CONTRAST:  OMNIPAQUE IOHEXOL 300 MG/ML  SOLN  COMPARISON:  01/10/2013  FINDINGS: CT CHEST FINDINGS  Right Port-A-Cath tip is positioned at the mid SVC level.  There is no axillary lymphadenopathy. 12 mm short axis low right paratracheal node is stable at 12 mm. 13 mm right hilar lymph node is unchanged. 13 mm right infrahilar lymph node is also stable. Postradiation change in the left hilar region is similar to  previous exam. The small pericardial effusion is unchanged. No evidence for pleural effusion.  Lung windows show emphysema bilaterally. 5 mm posterior right upper lobe subpleural nodule is 6 mm on the previous study. The probable scarring in the posterior right lower lobe is stable in appearance. This tracks up into the posterior right hilar region and likely represents post radiation fibrosis.  Postradiation changes in the medial left lung are stable.  Bone windows reveal no worrisome lytic or sclerotic osseous lesions.    CT ABDOMEN AND PELVIS FINDINGS  No focal abnormality is seen in the liver. Calcification in the peripheral spleen laterally is stable. The stomach, duodenum, pancreas, and adrenal glands have normal features. No focal abnormality is seen in either kidney.  Abdominal aortic aneurysm measures 5.0 cm today compared to 4.9 cm previously. Coronal diameter of this aneurysm is 5.3 cm. No free fluid or lymphadenopathy in the abdomen.  Imaging through the pelvis shows no free intraperitoneal fluid. No pelvic sidewall lymphadenopathy. Bladder is unremarkable. Uterus is surgically absent. There is no adnexal mass. Terminal ileum is normal. The appendix is normal.  Bone windows reveal no worrisome lytic or sclerotic osseous lesions. L2 compression fracture is stable.    IMPRESSION: No new or progressive disease in the chest. Stable appearance of post radiation change and scattered small mediastinal and hilar lymph nodes.  No evidence for metastatic disease in the abdomen or pelvis.  Abdominal aortic aneurysm.  Stable appearance of small to moderate pericardial effusion.   Electronically Signed   By: Kennith Center M.D.   On: 04/13/2013 15:19   ASSESSMENT AND PLAN: This is a very pleasant 62 years old white female with metastatic non-small cell lung cancer, adenocarcinoma status post stereotactic radiotherapy to brain lesion as well as concurrent chemoradiation and consolidation systemic chemotherapy  with carboplatin and Alimta status post 3 cycles. The patient is doing fine and the recent scan showed no evidence for disease Progression. I discussed the scan results with the patient today. I recommended for her to continue on observation with repeat CT scan of the chest, abdomen and pelvis in 4 months. She was advised to call immediately if she has any concerning symptoms in the interval. The patient voices understanding of current disease status and treatment options and is in agreement with the current care plan.  All questions were answered. The patient knows to call the clinic with any problems, questions or concerns. We can certainly see the patient much sooner if necessary.

## 2013-04-15 ENCOUNTER — Encounter: Payer: Self-pay | Admitting: *Deleted

## 2013-04-15 ENCOUNTER — Telehealth: Payer: Self-pay | Admitting: Internal Medicine

## 2013-04-15 NOTE — Progress Notes (Signed)
[facility] Clinical Social Work  Clinical Social Work was referred by Database administrator for assessment of psychosocial needs.  Clinical Social Worker contacted patient by phone to offer support and assess for needs.  Ms. Dierolf states she needs financial assistance for heating and air repair.  She states she cannot afford to pay the technician to perform evaluation (which costs around $300.00).  This price does not include repairing the unit, only the evaluation and an estimate.  The patient has no heat in the home and no other home she can reside in.  CSW informed patient she is not aware of any resources to provide assistance.  The patient has already utilized the [employer], however, CSW encouraged patient to call Jaclyn Shaggy to identify any possible [place] [employer] Companies that are able to assist.   Kathrin Penner, MSW, LCSW Clinical Social Worker [facility] [phone]

## 2013-05-18 ENCOUNTER — Other Ambulatory Visit: Payer: Self-pay | Admitting: Radiation Therapy

## 2013-05-18 DIAGNOSIS — C7931 Secondary malignant neoplasm of brain: Secondary | ICD-10-CM

## 2013-05-23 ENCOUNTER — Telehealth: Payer: Self-pay | Admitting: *Deleted

## 2013-05-23 NOTE — Telephone Encounter (Signed)
CALLED PATIENT TO ASK ABOUT COMING IN FOR LABS, LVM FOR A RETURN CALL 

## 2013-05-27 ENCOUNTER — Ambulatory Visit
Admission: RE | Admit: 2013-05-27 | Discharge: 2013-05-27 | Disposition: A | Discharge status: Home / Self Care | Payer: Medicaid Other | Source: Ambulatory Visit | Attending: Radiation Oncology | Admitting: Radiation Oncology

## 2013-05-27 DIAGNOSIS — C7931 Secondary malignant neoplasm of brain: Secondary | ICD-10-CM

## 2013-05-27 MED ORDER — GADOBENATE DIMEGLUMINE 529 MG/ML IV SOLN
17.0000 mL | Freq: Once | INTRAVENOUS | Status: AC | PRN
  Administered 2013-05-27: 17 mL via INTRAVENOUS

## 2013-05-29 ENCOUNTER — Encounter: Payer: Self-pay | Admitting: Radiation Oncology

## 2013-05-29 NOTE — Progress Notes (Signed)
Radiation Oncology         [phone] ________________________________  Name: Courtney West MRN: [id number]  Date: 05/30/2013  DOB: 09/03/1950  Multidisciplinary Neuro Oncology Clinic Follow-Up Visit Note  CC: Courtney Blamer, MD  Courtney Blamer, MD  Diagnosis:   62 year old woman with metastatic lung cancer status post:  1. SRS to a 10-mm left frontal and 5-mm right frontal brain metastasis to 20 Gy on January 16, 2011.  2. The patient's primary lung cancer was treated to 66 Gy in 33 fractions of 2 Gy through March 10, 2011 . 3. SRS to 6 new brain metastases (2 adjacent left temporal brain metastases, a 4.7-mm vermis target, a 3.4-mm right frontal brain metastasis, a left parietal 5.3-mm brain metastasis, and a 6.6-mm left cerebellar brain metastasis) received 20 Gy in a single fraction.April 18, 2011. 4. SRS to a 4 mm posterior frontal target to 20 Gy on 10/20/2011 5. SBRT to a 1.6 cm metachronous clinical stage IA primary cancer of the right lower lung on 01/19/12, 01/21/12, and 01/26/12 to 54 gray in 3 fractions of 18 gray   Interval Since Last Radiation:  14  months  Narrative:  The patient returns today for routine follow-up.  The recent films were presented in our multidisciplinary conference with neuroradiology just prior to the clinic.                            ALLERGIES:  is allergic to carboplatin; codeine; dilaudid; penicillins; sulfa antibiotics; and dilaudid.  Meds: Current Outpatient Prescriptions  Medication Sig Dispense Refill  . amitriptyline (ELAVIL) 100 MG tablet Take 200 mg by mouth at bedtime.        . budesonide-formoterol (SYMBICORT) 80-4.5 MCG/ACT inhaler Inhale 2 puffs into the lungs as needed.       . gabapentin (NEURONTIN) 300 MG capsule Take 2 capsules (600 mg total) by mouth 3 (three) times daily.  180 capsule  2  . lidocaine-prilocaine (EMLA) cream Apply topically as needed.  30 g  0   No current facility-administered medications for this encounter.     Physical Findings: The patient is in no acute distress. Patient is alert and oriented.  vitals were not taken for this visit..  No significant changes.  Lab Findings: Lab Results  Component Value Date   WBC 9.9 04/13/2013   HGB 15.8 04/13/2013   HCT 46.9* 04/13/2013   MCV 92.7 04/13/2013   PLT 200 04/13/2013    @LASTCHEM @  Radiographic Findings: Mr Courtney West WU Contrast  05/27/2013   CLINICAL DATA:  Lung cancer with brain metastases. Status post SRS. Restaging.  EXAM: MRI HEAD WITHOUT AND WITH CONTRAST  TECHNIQUE: Multiplanar, multiecho pulse sequences of the brain and surrounding structures were obtained without and with intravenous contrast.  CONTRAST:  17mL MULTIHANCE GADOBENATE DIMEGLUMINE 529 MG/ML IV SOLN  COMPARISON:  Multiple prior exams, most recently 02/15/2013.  FINDINGS: The left cerebellar lesion demonstrates continued interval growth and, now measuring 5.2 mm.  A left temporal lesion is stable to slightly decreased in size.  The lesion in the anterior right frontal lobe demonstrates persistent interval growth, now measuring 7.5 x 5.0 cm on axial image 87.  A posterior left frontal lobe lesion is similar in size to the prior exam, measuring 7 x 6 mm.  A linear lesion in the right frontal operculum is also stable.  No new lesions are evident.  The edema surrounding the posterior left frontal lobe lesion  has slightly decreased. Edema in the anterior right frontal lobe lesion is similar to the prior exam.  Flow is present in the major intracranial arteries. Globes and orbits are intact. The paranasal sinuses and mastoid air cells are clear.  IMPRESSION: 1. Five intracranial metastatic lesions are again seen. 2. Interval growth of a left cerebellar lesion suggesting progressive disease. 3. Persistent interval growth of a lesion in the anterior right frontal lobe is also worrisome for residual disease. 4. Stable appearance of a left temporal lobe lesion, left posterior frontal lobe  lesion, and linear lesion in the right frontal operculum.   Electronically Signed   By: Courtney West M.D.   On: 05/27/2013 16:04    Impression:  The patient has a mixed response at this time.  She has enlargement of 2 treated sites.  Plan:  MRI in 3 months then follow-up  _____________________________________  Courtney West, M.D.

## 2013-05-30 ENCOUNTER — Ambulatory Visit (HOSPITAL_BASED_OUTPATIENT_CLINIC_OR_DEPARTMENT_OTHER): Payer: Medicare Other

## 2013-05-30 ENCOUNTER — Telehealth: Payer: Self-pay | Admitting: Internal Medicine

## 2013-05-30 ENCOUNTER — Encounter: Payer: Self-pay | Admitting: Radiation Oncology

## 2013-05-30 ENCOUNTER — Encounter (INDEPENDENT_AMBULATORY_CARE_PROVIDER_SITE_OTHER): Payer: Self-pay

## 2013-05-30 ENCOUNTER — Ambulatory Visit
Admission: RE | Admit: 2013-05-30 | Discharge: 2013-05-30 | Disposition: A | Discharge status: Home / Self Care | Payer: Medicare Other | Source: Ambulatory Visit | Attending: Radiation Oncology | Admitting: Radiation Oncology

## 2013-05-30 VITALS — BP 135/87 | HR 86 | Temp 98.0°F | Resp 18 | Wt 184.0 lb

## 2013-05-30 DIAGNOSIS — C719 Malignant neoplasm of brain, unspecified: Secondary | ICD-10-CM

## 2013-05-30 DIAGNOSIS — Z452 Encounter for adjustment and management of vascular access device: Secondary | ICD-10-CM

## 2013-05-30 DIAGNOSIS — C7931 Secondary malignant neoplasm of brain: Secondary | ICD-10-CM

## 2013-05-30 DIAGNOSIS — C3430 Malignant neoplasm of lower lobe, unspecified bronchus or lung: Secondary | ICD-10-CM

## 2013-05-30 DIAGNOSIS — Z923 Personal history of irradiation: Secondary | ICD-10-CM

## 2013-05-30 DIAGNOSIS — C3490 Malignant neoplasm of unspecified part of unspecified bronchus or lung: Secondary | ICD-10-CM

## 2013-05-30 MED ORDER — HEPARIN SOD (PORK) LOCK FLUSH 100 UNIT/ML IV SOLN
500.0000 [IU] | Freq: Once | INTRAVENOUS | Status: AC
  Administered 2013-05-30: 500 [IU] via INTRAVENOUS
  Filled 2013-05-30: qty 5

## 2013-05-30 MED ORDER — HYDROCODONE-ACETAMINOPHEN 5-325 MG PO TABS: 1.0000 | ORAL_TABLET | Freq: Four times a day (QID) | ORAL | Status: DC | PRN

## 2013-05-30 MED ORDER — GABAPENTIN 300 MG PO CAPS: 600.0000 mg | ORAL_CAPSULE | Freq: Three times a day (TID) | ORAL | Status: DC

## 2013-05-30 MED ORDER — SODIUM CHLORIDE 0.9 % IJ SOLN
10.0000 mL | INTRAMUSCULAR | Status: DC | PRN
  Administered 2013-05-30: 10 mL via INTRAVENOUS
  Filled 2013-05-30: qty 10

## 2013-05-30 MED ORDER — BUDESONIDE-FORMOTEROL FUMARATE 80-4.5 MCG/ACT IN AERO: 2.0000 | INHALATION_SPRAY | RESPIRATORY_TRACT | Status: DC | PRN

## 2013-05-30 NOTE — Telephone Encounter (Signed)
Gave pt oral contrast  For Ct on February 2015

## 2013-05-30 NOTE — Progress Notes (Signed)
Reports she is out of her pain medication, symbicort and neurontin. Patient scheduled for port flush following this appointment. Report right hip pain 5 on a scale of 0-10 with movement. Reports daily headaches related "being a worrier." Reports occasional episodes of nausea but, denies emesis. Denies diplopia or ringing in the ears. Affect appropriate. Answers questions appropriately. Denies episodes of confusion or memory changes. Steady gait noted. Dry cough noted. Denies shortness of breath.

## 2013-07-07 ENCOUNTER — Encounter (INDEPENDENT_AMBULATORY_CARE_PROVIDER_SITE_OTHER): Payer: Self-pay

## 2013-07-07 ENCOUNTER — Ambulatory Visit (HOSPITAL_BASED_OUTPATIENT_CLINIC_OR_DEPARTMENT_OTHER): Payer: Medicare Other

## 2013-07-07 VITALS — BP 104/63 | HR 72 | Temp 98.0°F

## 2013-07-07 DIAGNOSIS — Z95828 Presence of other vascular implants and grafts: Secondary | ICD-10-CM

## 2013-07-07 DIAGNOSIS — C7949 Secondary malignant neoplasm of other parts of nervous system: Secondary | ICD-10-CM

## 2013-07-07 DIAGNOSIS — C3430 Malignant neoplasm of lower lobe, unspecified bronchus or lung: Secondary | ICD-10-CM

## 2013-07-07 DIAGNOSIS — C7931 Secondary malignant neoplasm of brain: Secondary | ICD-10-CM

## 2013-07-07 DIAGNOSIS — Z452 Encounter for adjustment and management of vascular access device: Secondary | ICD-10-CM

## 2013-07-07 MED ORDER — HEPARIN SOD (PORK) LOCK FLUSH 100 UNIT/ML IV SOLN
500.0000 [IU] | Freq: Once | INTRAVENOUS | Status: AC
  Administered 2013-07-07: 500 [IU] via INTRAVENOUS
  Filled 2013-07-07: qty 5

## 2013-07-07 MED ORDER — SODIUM CHLORIDE 0.9 % IJ SOLN
10.0000 mL | INTRAMUSCULAR | Status: DC | PRN
  Administered 2013-07-07: 10 mL via INTRAVENOUS
  Filled 2013-07-07: qty 10

## 2013-07-07 NOTE — Patient Instructions (Signed)

## 2013-07-12 ENCOUNTER — Emergency Department (HOSPITAL_COMMUNITY)
Admission: EM | Admit: 2013-07-12 | Discharge: 2013-07-12 | Disposition: A | Discharge status: Home / Self Care | Payer: Medicare Other | Attending: Emergency Medicine | Admitting: Emergency Medicine

## 2013-07-12 ENCOUNTER — Encounter (HOSPITAL_COMMUNITY): Payer: Self-pay | Admitting: Emergency Medicine

## 2013-07-12 DIAGNOSIS — J449 Chronic obstructive pulmonary disease, unspecified: Secondary | ICD-10-CM | POA: Insufficient documentation

## 2013-07-12 DIAGNOSIS — K529 Noninfective gastroenteritis and colitis, unspecified: Secondary | ICD-10-CM

## 2013-07-12 DIAGNOSIS — Z880 Allergy status to penicillin: Secondary | ICD-10-CM | POA: Insufficient documentation

## 2013-07-12 DIAGNOSIS — Z85841 Personal history of malignant neoplasm of brain: Secondary | ICD-10-CM | POA: Insufficient documentation

## 2013-07-12 DIAGNOSIS — F17200 Nicotine dependence, unspecified, uncomplicated: Secondary | ICD-10-CM | POA: Insufficient documentation

## 2013-07-12 DIAGNOSIS — I10 Essential (primary) hypertension: Secondary | ICD-10-CM | POA: Insufficient documentation

## 2013-07-12 DIAGNOSIS — Z8639 Personal history of other endocrine, nutritional and metabolic disease: Secondary | ICD-10-CM | POA: Insufficient documentation

## 2013-07-12 DIAGNOSIS — K5289 Other specified noninfective gastroenteritis and colitis: Secondary | ICD-10-CM | POA: Insufficient documentation

## 2013-07-12 DIAGNOSIS — Z85118 Personal history of other malignant neoplasm of bronchus and lung: Secondary | ICD-10-CM | POA: Insufficient documentation

## 2013-07-12 DIAGNOSIS — Z862 Personal history of diseases of the blood and blood-forming organs and certain disorders involving the immune mechanism: Secondary | ICD-10-CM | POA: Insufficient documentation

## 2013-07-12 DIAGNOSIS — Z8673 Personal history of transient ischemic attack (TIA), and cerebral infarction without residual deficits: Secondary | ICD-10-CM | POA: Insufficient documentation

## 2013-07-12 DIAGNOSIS — J4489 Other specified chronic obstructive pulmonary disease: Secondary | ICD-10-CM | POA: Insufficient documentation

## 2013-07-12 DIAGNOSIS — Z79899 Other long term (current) drug therapy: Secondary | ICD-10-CM | POA: Insufficient documentation

## 2013-07-12 LAB — CBC WITH DIFFERENTIAL/PLATELET
Basophils Absolute: 0.1 10*3/uL (ref 0.0–0.1)
Basophils Relative: 1 % (ref 0–1)
EOS ABS: 0.3 10*3/uL (ref 0.0–0.7)
Eosinophils Relative: 3 % (ref 0–5)
HCT: 44.5 % (ref 36.0–46.0)
Hemoglobin: 15.8 g/dL — ABNORMAL HIGH (ref 12.0–15.0)
Lymphocytes Relative: 29 % (ref 12–46)
Lymphs Abs: 2.6 10*3/uL (ref 0.7–4.0)
MCH: 32.4 pg (ref 26.0–34.0)
MCHC: 35.5 g/dL (ref 30.0–36.0)
MCV: 91.2 fL (ref 78.0–100.0)
MONO ABS: 0.9 10*3/uL (ref 0.1–1.0)
Monocytes Relative: 10 % (ref 3–12)
NEUTROS PCT: 57 % (ref 43–77)
Neutro Abs: 4.9 10*3/uL (ref 1.7–7.7)
PLATELETS: 188 10*3/uL (ref 150–400)
RBC: 4.88 MIL/uL (ref 3.87–5.11)
RDW: 12.5 % (ref 11.5–15.5)
WBC: 8.8 10*3/uL (ref 4.0–10.5)

## 2013-07-12 LAB — COMPREHENSIVE METABOLIC PANEL
ALT: 14 U/L (ref 0–35)
AST: 14 U/L (ref 0–37)
Albumin: 3.3 g/dL — ABNORMAL LOW (ref 3.5–5.2)
Alkaline Phosphatase: 93 U/L (ref 39–117)
BUN: 9 mg/dL (ref 6–23)
CALCIUM: 8.8 mg/dL (ref 8.4–10.5)
CO2: 23 mEq/L (ref 19–32)
CREATININE: 0.65 mg/dL (ref 0.50–1.10)
Chloride: 103 mEq/L (ref 96–112)
GFR calc Af Amer: >90 mL/min (ref >90)
GFR calc non Af Amer: >90 mL/min (ref >90)
Glucose, Bld: 95 mg/dL (ref 70–99)
Potassium: 3.6 mEq/L — ABNORMAL LOW (ref 3.7–5.3)
SODIUM: 139 meq/L (ref 137–147)
TOTAL PROTEIN: 7.4 g/dL (ref 6.0–8.3)
Total Bilirubin: 0.2 mg/dL — ABNORMAL LOW (ref 0.3–1.2)

## 2013-07-12 MED ORDER — DICYCLOMINE HCL 20 MG PO TABS: ORAL_TABLET | ORAL | Status: DC

## 2013-07-12 MED ORDER — CIPROFLOXACIN HCL 500 MG PO TABS: 500.0000 mg | ORAL_TABLET | Freq: Two times a day (BID) | ORAL | Status: DC

## 2013-07-12 NOTE — ED Notes (Signed)
Pt reports diarrhea since last Thursday.  Pt reports abd discomfort 4/10

## 2013-07-12 NOTE — ED Provider Notes (Signed)
CSN: [id number]     Arrival date & time 07/12/13  1939 History  This chart was scribed for Maudry Diego, MD by Eugenia Mcalpine, ED Scribe. This patient was seen in room APA19/APA19 and the patient's care was started at 8:59 PM.    Chief Complaint  Patient presents with  . Diarrhea   (Consider location/radiation/quality/duration/timing/severity/associated sxs/prior Treatment) Patient is a 63 y.o. female presenting with diarrhea. The history is provided by the patient. No language interpreter was used.  Diarrhea Severity:  Mild Onset quality:  Sudden Duration:  5 days Timing:  Constant Progression:  Unchanged Relieved by:  None tried Ineffective treatments:  None tried Associated symptoms: no abdominal pain, no chills, no recent cough, no fever, no headaches and no vomiting   Risk factors: no sick contacts     Past Medical History  Diagnosis Date  . Hypertension   . COPD (chronic obstructive pulmonary disease)   . Stroke   . Abdominal aortic aneurysm   . Dyslipidemia   . S/P radiation therapy 7/12 thru 9/12, 04/18/11    xrt to brain mets  . Lung cancer 12/2010  . Brain cancer     mets from lung primary  . Allergy     codeine,pcn,sulfa drugs  . Radiation 10/20/2011    frontal mets/Palliation  . History of radiation therapy 01/19/12, 01/21/12, 01/26/12    RLL lung  . History of radiation therapy 04/18/11    single fraction to 6 brain mets  . Clotted vascular catheter 03/30/2012  . Lung cancer 12/22/2010   Past Surgical History  Procedure Laterality Date  . Carpal tunnel release    . Left knee surgery    . Cholecystectomy    . Partial hysterectomy     Family History  Problem Relation Age of Onset  . Cancer Father     lung, leukemia  . Cancer Maternal Aunt     kidney   History  Substance Use Topics  . Smoking status: Current Every Day Smoker -- 1.00 packs/day for 50 years    Types: Cigarettes  . Smokeless tobacco: Not on file  . Alcohol Use: No   OB History   Grav  Para Term Preterm Abortions TAB SAB Ect Mult Living                 Review of Systems  Constitutional: Negative for fever, chills, appetite change and fatigue.  HENT: Negative for congestion, ear discharge and sinus pressure.   Eyes: Negative for discharge.  Respiratory: Negative for cough.   Cardiovascular: Negative for chest pain.  Gastrointestinal: Positive for diarrhea. Negative for vomiting and abdominal pain.  Genitourinary: Negative for frequency and hematuria.  Musculoskeletal: Negative for back pain.  Skin: Negative for rash.  Neurological: Negative for seizures and headaches.  Psychiatric/Behavioral: Negative for hallucinations.  All other systems reviewed and are negative.    Allergies  Carboplatin; Codeine; Sulfa antibiotics; Dilaudid; and Penicillins  Home Medications   Current Outpatient Rx  Name  Route  Sig  Dispense  Refill  . amitriptyline (ELAVIL) 100 MG tablet   Oral   Take 200 mg by mouth at bedtime.           . budesonide-formoterol (SYMBICORT) 80-4.5 MCG/ACT inhaler   Inhalation   Inhale 2 puffs into the lungs as needed.   1 Inhaler   5   . gabapentin (NEURONTIN) 300 MG capsule   Oral   Take 2 capsules (600 mg total) by mouth 3 (three) times daily.  180 capsule   2   . HYDROcodone-acetaminophen (NORCO/VICODIN) 5-325 MG per tablet   Oral   Take 1-2 tablets by mouth every 6 (six) hours as needed for moderate pain.   90 tablet   0   . ibuprofen (ADVIL,MOTRIN) 200 MG tablet   Oral   Take 600 mg by mouth every 6 (six) hours as needed for headache.         . lidocaine-prilocaine (EMLA) cream   Topical   Apply topically as needed.   30 g   0    BP 125/73  Pulse 82  Temp(Src) 98.4 F (36.9 C) (Oral)  Resp 18  Ht 5\' 3"  (1.6 m)  Wt 185 lb (83.915 kg)  BMI 32.78 kg/m2  SpO2 94% Physical Exam  Nursing note and vitals reviewed. Constitutional: She is oriented to person, place, and time. She appears well-developed.  HENT:  Head:  Normocephalic.  Eyes: Conjunctivae and EOM are normal. No scleral icterus.  Neck: Neck supple. No thyromegaly present.  Cardiovascular: Normal rate and regular rhythm.  Exam reveals no gallop and no friction rub.   No murmur heard. Pulmonary/Chest: No stridor. She has no wheezes. She has no rales. She exhibits no tenderness.  Abdominal: She exhibits no distension. There is no tenderness. There is no rebound.  Musculoskeletal: Normal range of motion. She exhibits no edema.  Lymphadenopathy:    She has no cervical adenopathy.  Neurological: She is oriented to person, place, and time. She exhibits normal muscle tone. Coordination normal.  Skin: No rash noted. No erythema.  Psychiatric: She has a normal mood and affect. Her behavior is normal.    ED Course  Procedures (including critical care time) Labs Review Labs Reviewed - No data to display Imaging Review No results found.  EKG Interpretation   None       MDM  Colitis        The chart was scribed for me under my direct supervision.  I personally performed the history, physical, and medical decision making and all procedures in the evaluation of this patient.Maudry Diego, MD 07/12/13 2251

## 2013-07-12 NOTE — Discharge Instructions (Signed)
Drink plenty of fluids and follow up as needed °

## 2013-08-15 ENCOUNTER — Other Ambulatory Visit (HOSPITAL_BASED_OUTPATIENT_CLINIC_OR_DEPARTMENT_OTHER): Payer: Medicare Other

## 2013-08-15 ENCOUNTER — Encounter (INDEPENDENT_AMBULATORY_CARE_PROVIDER_SITE_OTHER): Payer: Self-pay

## 2013-08-15 ENCOUNTER — Other Ambulatory Visit: Payer: Self-pay | Admitting: Radiation Oncology

## 2013-08-15 ENCOUNTER — Encounter (HOSPITAL_COMMUNITY): Payer: Self-pay

## 2013-08-15 ENCOUNTER — Ambulatory Visit (HOSPITAL_COMMUNITY)
Admission: RE | Admit: 2013-08-15 | Discharge: 2013-08-15 | Disposition: A | Discharge status: Home / Self Care | Payer: Medicare Other | Source: Ambulatory Visit | Attending: Internal Medicine | Admitting: Internal Medicine

## 2013-08-15 DIAGNOSIS — C7949 Secondary malignant neoplasm of other parts of nervous system: Secondary | ICD-10-CM

## 2013-08-15 DIAGNOSIS — I319 Disease of pericardium, unspecified: Secondary | ICD-10-CM | POA: Insufficient documentation

## 2013-08-15 DIAGNOSIS — C3490 Malignant neoplasm of unspecified part of unspecified bronchus or lung: Secondary | ICD-10-CM

## 2013-08-15 DIAGNOSIS — I714 Abdominal aortic aneurysm, without rupture, unspecified: Secondary | ICD-10-CM | POA: Insufficient documentation

## 2013-08-15 DIAGNOSIS — C7931 Secondary malignant neoplasm of brain: Secondary | ICD-10-CM

## 2013-08-15 DIAGNOSIS — C3430 Malignant neoplasm of lower lobe, unspecified bronchus or lung: Secondary | ICD-10-CM

## 2013-08-15 DIAGNOSIS — Z9221 Personal history of antineoplastic chemotherapy: Secondary | ICD-10-CM | POA: Insufficient documentation

## 2013-08-15 DIAGNOSIS — Z923 Personal history of irradiation: Secondary | ICD-10-CM | POA: Insufficient documentation

## 2013-08-15 LAB — CBC WITH DIFFERENTIAL/PLATELET
BASO%: 0.1 % (ref 0.0–2.0)
Basophils Absolute: 0 10*3/uL (ref 0.0–0.1)
EOS ABS: 0.3 10*3/uL (ref 0.0–0.5)
EOS%: 3 % (ref 0.0–7.0)
HEMATOCRIT: 49.3 % — AB (ref 34.8–46.6)
HGB: 16.8 g/dL — ABNORMAL HIGH (ref 11.6–15.9)
LYMPH%: 25.9 % (ref 14.0–49.7)
MCH: 31.6 pg (ref 25.1–34.0)
MCHC: 34 g/dL (ref 31.5–36.0)
MCV: 92.9 fL (ref 79.5–101.0)
MONO#: 0.6 10*3/uL (ref 0.1–0.9)
MONO%: 6 % (ref 0.0–14.0)
NEUT%: 65 % (ref 38.4–76.8)
NEUTROS ABS: 6.1 10*3/uL (ref 1.5–6.5)
Platelets: 182 10*3/uL (ref 145–400)
RBC: 5.31 10*6/uL (ref 3.70–5.45)
RDW: 13.3 % (ref 11.2–14.5)
WBC: 9.4 10*3/uL (ref 3.9–10.3)
lymph#: 2.4 10*3/uL (ref 0.9–3.3)

## 2013-08-15 LAB — COMPREHENSIVE METABOLIC PANEL (CC13)
ALK PHOS: 111 U/L (ref 40–150)
ALT: 14 U/L (ref 0–55)
AST: 17 U/L (ref 5–34)
Albumin: 3.7 g/dL (ref 3.5–5.0)
Anion Gap: 13 mEq/L — ABNORMAL HIGH (ref 3–11)
BILIRUBIN TOTAL: 0.44 mg/dL (ref 0.20–1.20)
BUN: 13.1 mg/dL (ref 7.0–26.0)
CO2: 25 meq/L (ref 22–29)
Calcium: 10.1 mg/dL (ref 8.4–10.4)
Chloride: 104 mEq/L (ref 98–109)
Creatinine: 0.8 mg/dL (ref 0.6–1.1)
Glucose: 108 mg/dl (ref 70–140)
Potassium: 4.4 mEq/L (ref 3.5–5.1)
SODIUM: 142 meq/L (ref 136–145)
TOTAL PROTEIN: 7.8 g/dL (ref 6.4–8.3)

## 2013-08-15 MED ORDER — HYDROCODONE-ACETAMINOPHEN 5-325 MG PO TABS: 1.0000 | ORAL_TABLET | Freq: Four times a day (QID) | ORAL | Status: DC | PRN

## 2013-08-15 MED ORDER — IOHEXOL 300 MG/ML  SOLN
100.0000 mL | Freq: Once | INTRAMUSCULAR | Status: AC | PRN
  Administered 2013-08-15: 100 mL via INTRAVENOUS

## 2013-08-17 ENCOUNTER — Telehealth: Payer: Self-pay | Admitting: Internal Medicine

## 2013-08-17 ENCOUNTER — Ambulatory Visit (HOSPITAL_BASED_OUTPATIENT_CLINIC_OR_DEPARTMENT_OTHER): Payer: Medicare Other | Admitting: Internal Medicine

## 2013-08-17 ENCOUNTER — Encounter: Payer: Self-pay | Admitting: Internal Medicine

## 2013-08-17 VITALS — BP 106/68 | HR 93 | Temp 97.8°F | Resp 18 | Ht 63.0 in | Wt 183.7 lb

## 2013-08-17 DIAGNOSIS — C3490 Malignant neoplasm of unspecified part of unspecified bronchus or lung: Secondary | ICD-10-CM

## 2013-08-17 DIAGNOSIS — C3430 Malignant neoplasm of lower lobe, unspecified bronchus or lung: Secondary | ICD-10-CM

## 2013-08-17 DIAGNOSIS — C7949 Secondary malignant neoplasm of other parts of nervous system: Secondary | ICD-10-CM

## 2013-08-17 DIAGNOSIS — C7931 Secondary malignant neoplasm of brain: Secondary | ICD-10-CM

## 2013-08-17 NOTE — Patient Instructions (Signed)
Smoking Cessation, Tips for Success If you are ready to quit smoking, congratulations! You have chosen to help yourself be healthier. Cigarettes bring nicotine, tar, carbon monoxide, and other irritants into your body. Your lungs, heart, and blood vessels will be able to work better without these poisons. There are many different ways to quit smoking. Nicotine gum, nicotine patches, a nicotine inhaler, or nicotine nasal spray can help with physical craving. Hypnosis, support groups, and medicines help break the habit of smoking. WHAT THINGS CAN I DO TO MAKE QUITTING EASIER?  Here are some tips to help you quit for good:  Pick a date when you will quit smoking completely. Tell all of your friends and family about your plan to quit on that date.  Do not try to slowly cut down on the number of cigarettes you are smoking. Pick a quit date and quit smoking completely starting on that day.  Throw away all cigarettes.   Clean and remove all ashtrays from your home, work, and car.   On a card, write down your reasons for quitting. Carry the card with you and read it when you get the urge to smoke.   Cleanse your body of nicotine. Drink enough water and fluids to keep your urine clear or pale yellow. Do this after quitting to flush the nicotine from your body.   Learn to predict your moods. Do not let a bad situation be your excuse to have a cigarette. Some situations in your life might tempt you into wanting a cigarette.   Never have "just one" cigarette. It leads to wanting another and another. Remind yourself of your decision to quit.   Change habits associated with smoking. If you smoked while driving or when feeling stressed, try other activities to replace smoking. Stand up when drinking your coffee. Brush your teeth after eating. Sit in a different chair when you read the paper. Avoid alcohol while trying to quit, and try to drink fewer caffeinated beverages. Alcohol and caffeine may urge  you to smoke.   Avoid foods and drinks that can trigger a desire to smoke, such as sugary or spicy foods and alcohol.   Ask people who smoke not to smoke around you.   Have something planned to do right after eating or having a cup of coffee. For example, plan to take a walk or exercise.   Try a relaxation exercise to calm you down and decrease your stress. Remember, you may be tense and nervous for the first 2 weeks after you quit, but this will pass.   Find new activities to keep your hands busy. Play with a pen, coin, or rubber band. Doodle or draw things on paper.   Brush your teeth right after eating. This will help cut down on the craving for the taste of tobacco after meals. You can also try mouthwash.   Use oral substitutes in place of cigarettes. Try using lemon drops, carrots, cinnamon sticks, or chewing gum. Keep them handy so they are available when you have the urge to smoke.   When you have the urge to smoke, try deep breathing.   Designate your home as a nonsmoking area.   If you are a heavy smoker, ask your health care provider about a prescription for nicotine chewing gum. It can ease your withdrawal from nicotine.   Reward yourself. Set aside the cigarette money you save and buy yourself something nice.   Look for support from others. Join a support group or   smoking cessation program. Ask someone at home or at work to help you with your plan to quit smoking.   Always ask yourself, "Do I need this cigarette or is this just a reflex?" Tell yourself, "Today, I choose not to smoke," or "I do not want to smoke." You are reminding yourself of your decision to quit.  Do not replace cigarette smoking with electronic cigarettes (commonly called e-cigarettes). The safety of e-cigarettes is unknown, and some may contain harmful chemicals.  If you relapse, do not give up! Plan ahead and think about what you will do the next time you get the urge to smoke.  HOW WILL  I FEEL WHEN I QUIT SMOKING? You may have symptoms of withdrawal because your body is used to nicotine (the addictive substance in cigarettes). You may crave cigarettes, be irritable, feel very hungry, cough often, get headaches, or have difficulty concentrating. The withdrawal symptoms are only temporary. They are strongest when you first quit but will go away within 10 14 days. When withdrawal symptoms occur, stay in control. Think about your reasons for quitting. Remind yourself that these are signs that your body is healing and getting used to being without cigarettes. Remember that withdrawal symptoms are easier to treat than the major diseases that smoking can cause.  Even after the withdrawal is over, expect periodic urges to smoke. However, these cravings are generally short lived and will go away whether you smoke or not. Do not smoke!  WHAT RESOURCES ARE AVAILABLE TO HELP ME QUIT SMOKING? Your health care provider can direct you to community resources or hospitals for support, which may include:  Group support.  Education.  Hypnosis.  Therapy. Document Released: 03/07/2004 Document Revised: 03/30/2013 Document Reviewed: 11/25/2012 [employer] Patient Information 2014 [employer].  

## 2013-08-17 NOTE — Telephone Encounter (Signed)
Gave pt appt for lab and MD, for June, gave pt oral contrAST

## 2013-08-17 NOTE — Progress Notes (Signed)
[facility] Telephone:([phone]   Fax:([phone]) [place], [place] [address], [place] [address]  DIAGNOSIS: Metastatic non-small cell lung cancer, favoring adenocarcinoma diagnosed in July of 2012, presented with locally advanced disease in the chest as well as brain metastasis. Veristrat test poor.   PRIOR THERAPY:  1. Status post stereotactic radiotherapy to 2 brain lesions under the care of Dr. Lisbeth Renshaw. 2. Status post a course of concurrent chemoradiation with weekly carboplatin and paclitaxel, last dose of chemotherapy was given 03/05/2011. 3. Systemic chemotherapy with carboplatin for AUC of 5 and Alimta 500 mg/M2. The patient is status post 3 cycles. Last dose was given 06/03/2011  CURRENT THERAPY: Observation.  INTERVAL HISTORY: Courtney West 63 y.o. female returns to the clinic today for routine four-month followup visit accompanied by a friend. The patient has no complaints today. She denied having any significant chest pain, shortness of breath, cough or hemoptysis. She has no weight loss or night sweats. The patient had repeat CT scan of the chest, abdomen and pelvis performed recently and she is here for evaluation and discussion of her scan results.  MEDICAL HISTORY: Past Medical History  Diagnosis Date  . Hypertension   . COPD (chronic obstructive pulmonary disease)   . Stroke   . Abdominal aortic aneurysm   . Dyslipidemia   . S/P radiation therapy 7/12 thru 9/12, 04/18/11    xrt to brain mets  . Lung cancer 12/2010  . Brain cancer     mets from lung primary  . Allergy     codeine,pcn,sulfa drugs  . Radiation 10/20/2011    frontal mets/Palliation  . History of radiation therapy 01/19/12, 01/21/12, 01/26/12    RLL lung  . History of radiation therapy 04/18/11    single fraction to 6 brain mets  . Clotted vascular catheter 03/30/2012  . Lung cancer 12/22/2010    ALLERGIES:  is allergic to  carboplatin; codeine; sulfa antibiotics; dilaudid; and penicillins.  MEDICATIONS:  Current Outpatient Prescriptions  Medication Sig Dispense Refill  . amitriptyline (ELAVIL) 100 MG tablet Take 200 mg by mouth at bedtime.        . budesonide-formoterol (SYMBICORT) 80-4.5 MCG/ACT inhaler Inhale 2 puffs into the lungs as needed.  1 Inhaler  5  . gabapentin (NEURONTIN) 300 MG capsule Take 2 capsules (600 mg total) by mouth 3 (three) times daily.  180 capsule  2  . HYDROcodone-acetaminophen (NORCO/VICODIN) 5-325 MG per tablet Take 1-2 tablets by mouth every 6 (six) hours as needed for moderate pain.  90 tablet  0  . ibuprofen (ADVIL,MOTRIN) 200 MG tablet Take 600 mg by mouth every 6 (six) hours as needed for headache.      . lidocaine-prilocaine (EMLA) cream Apply topically as needed.  30 g  0   No current facility-administered medications for this visit.    SURGICAL HISTORY:  Past Surgical History  Procedure Laterality Date  . Carpal tunnel release    . Left knee surgery    . Cholecystectomy    . Partial hysterectomy      REVIEW OF SYSTEMS:  A comprehensive review of systems was negative except for: Constitutional: positive for fatigue Respiratory: positive for dyspnea on exertion   PHYSICAL EXAMINATION: General appearance: alert, cooperative, fatigued and no distress Head: Normocephalic, without obvious abnormality, atraumatic Neck: no adenopathy, no JVD, supple, symmetrical, trachea midline and thyroid not enlarged, symmetric, no tenderness/mass/nodules Lymph nodes: Cervical, supraclavicular,  and axillary nodes normal. Resp: clear to auscultation bilaterally Back: symmetric, no curvature. ROM normal. No CVA tenderness. Cardio: regular rate and rhythm, S1, S2 normal, no murmur, click, rub or gallop GI: soft, non-tender; bowel sounds normal; no masses,  no organomegaly Extremities: extremities normal, atraumatic, no cyanosis or edema  ECOG PERFORMANCE STATUS: 1 - Symptomatic but  completely ambulatory  Blood pressure 106/68, pulse 93, temperature 97.8 F (36.6 C), temperature source Oral, resp. rate 18, height 5\' 3"  (1.6 m), weight 183 lb 11.2 oz (83.326 kg).  LABORATORY DATA: Lab Results  Component Value Date   WBC 9.4 08/15/2013   HGB 16.8* 08/15/2013   HCT 49.3* 08/15/2013   MCV 92.9 08/15/2013   PLT 182 08/15/2013      Chemistry      Component Value Date/Time   NA 142 08/15/2013 1151   NA 139 07/12/2013 2120   NA 143 12/24/2011 1311   K 4.4 08/15/2013 1151   K 3.6* 07/12/2013 2120   K 4.7 12/24/2011 1311   CL 103 07/12/2013 2120   CL 106 10/06/2012 1303   CL 99 12/24/2011 1311   CO2 25 08/15/2013 1151   CO2 23 07/12/2013 2120   CO2 31 12/24/2011 1311   BUN 13.1 08/15/2013 1151   BUN 9 07/12/2013 2120   BUN 13 12/24/2011 1311   CREATININE 0.8 08/15/2013 1151   CREATININE 0.65 07/12/2013 2120   CREATININE 1.2 12/24/2011 1311      Component Value Date/Time   CALCIUM 10.1 08/15/2013 1151   CALCIUM 8.8 07/12/2013 2120   CALCIUM 10.2 12/24/2011 1311   ALKPHOS 111 08/15/2013 1151   ALKPHOS 93 07/12/2013 2120   ALKPHOS 98* 12/24/2011 1311   AST 17 08/15/2013 1151   AST 14 07/12/2013 2120   AST 18 12/24/2011 1311   ALT 14 08/15/2013 1151   ALT 14 07/12/2013 2120   ALT 18 12/24/2011 1311   BILITOT 0.44 08/15/2013 1151   BILITOT 0.2* 07/12/2013 2120   BILITOT 0.50 12/24/2011 1311       RADIOGRAPHIC STUDIES: Ct Chest W Contrast  08/15/2013   CLINICAL DATA:  Lung cancer restaging. Chemotherapy and radiation therapy complete.  EXAM: CT CHEST, ABDOMEN, AND PELVIS WITH CONTRAST  TECHNIQUE: Multidetector CT imaging of the chest, abdomen and pelvis was performed following the standard protocol during bolus administration of intravenous contrast.  CONTRAST:  166mL OMNIPAQUE IOHEXOL 300 MG/ML  SOLN  COMPARISON:  CT CHEST W/CM dated 04/13/2013  FINDINGS: CT CHEST FINDINGS  There is a port in the right anterior chest wall. No axillary or supraclavicular lymphadenopathy. No mediastinal hilar  lymphadenopathy. There is a moderate size pericardial effusion minimally increased and volume compared to prior. Effusion measures 19 mm in thickness along the right ventricular wall compared to 15 mm on prior. No central pulmonary embolism.  Review of the lung parenchyma demonstrates severe centrilobular emphysema. There is a subpleural nodule in the right upper lobe measuring 4 mm unchanged from prior. There is a reticular pattern in the left and right lower lobes extending from the hilum with some perihilar consolidation which is also unchanged from prior and most suggestive of radiation change. No new nodularity.  CT ABDOMEN AND PELVIS FINDINGS  There is no focal hepatic lesion. Post cholecystectomy. The pancreas, spleen, adrenal glands, kidneys are unchanged. Linear calcifications in the lateral aspect of the spleen are unchanged.  The stomach, small bowel, appendix, cecum normal. The colon and rectosigmoid colon are normal.  Abdominal aorta is aneurysmal 15 mm  unchanged from prior. No retroperitoneal periportal lymphadenopathy. Mesenteric adenopathy.  The post hysterectomy anatomy. The bladder is normal. No pelvic lymphadenopathy. No aggressive osseous lesion. Chronic compression fracture at L4 is unchanged.  IMPRESSION: 1. No evidence of lung cancer recurrence in the chest, abdomen, or pelvis. 2. Perihilar and lower lobe particular fibrotic pattern most suggestive of radiation change. 3. Slight increase in pericardial effusion. 4. Sable abdominal aortic aneurysm.   Electronically Signed   By: Suzy Bouchard M.D.   On: 08/15/2013 15:01    ASSESSMENT AND PLAN: This is a very pleasant 63 years old white female with metastatic non-small cell lung cancer, adenocarcinoma status post stereotactic radiotherapy to brain lesion as well as concurrent chemoradiation and consolidation systemic chemotherapy with carboplatin and Alimta status post 3 cycles. The patient is doing fine and the recent scan showed no  evidence for disease progression. I discussed the scan results with the patient today. I recommended for her to continue on observation with repeat CT scan of the chest, abdomen and pelvis in 4 months. She was advised to call immediately if she has any concerning symptoms in the interval. The patient voices understanding of current disease status and treatment options and is in agreement with the current care plan. I strongly encouraged her to quit smoking. All questions were answered. The patient knows to call the clinic with any problems, questions or concerns. We can certainly see the patient much sooner if necessary.  Disclaimer: This note was dictated with voice recognition software. Similar sounding words can inadvertently be transcribed and may not be corrected upon review.

## 2013-08-18 ENCOUNTER — Other Ambulatory Visit: Payer: Self-pay | Admitting: Radiation Therapy

## 2013-08-18 DIAGNOSIS — C7931 Secondary malignant neoplasm of brain: Secondary | ICD-10-CM

## 2013-08-18 DIAGNOSIS — C7949 Secondary malignant neoplasm of other parts of nervous system: Principal | ICD-10-CM

## 2013-09-02 ENCOUNTER — Ambulatory Visit
Admission: RE | Admit: 2013-09-02 | Discharge: 2013-09-02 | Disposition: A | Discharge status: Home / Self Care | Payer: Medicare Other | Source: Ambulatory Visit | Attending: Radiation Oncology | Admitting: Radiation Oncology

## 2013-09-02 DIAGNOSIS — C7949 Secondary malignant neoplasm of other parts of nervous system: Principal | ICD-10-CM

## 2013-09-02 DIAGNOSIS — C7931 Secondary malignant neoplasm of brain: Secondary | ICD-10-CM

## 2013-09-02 MED ORDER — GADOBENATE DIMEGLUMINE 529 MG/ML IV SOLN
17.0000 mL | Freq: Once | INTRAVENOUS | Status: AC | PRN
  Administered 2013-09-02: 17 mL via INTRAVENOUS

## 2013-09-05 ENCOUNTER — Encounter: Payer: Self-pay | Admitting: Radiation Oncology

## 2013-09-05 ENCOUNTER — Ambulatory Visit
Admission: RE | Admit: 2013-09-05 | Discharge: 2013-09-05 | Disposition: A | Discharge status: Home / Self Care | Payer: Medicare Other | Source: Ambulatory Visit | Attending: Radiation Oncology | Admitting: Radiation Oncology

## 2013-09-05 VITALS — BP 118/73 | HR 85 | Resp 16

## 2013-09-05 DIAGNOSIS — C7931 Secondary malignant neoplasm of brain: Secondary | ICD-10-CM

## 2013-09-05 DIAGNOSIS — C7949 Secondary malignant neoplasm of other parts of nervous system: Principal | ICD-10-CM

## 2013-09-05 NOTE — Progress Notes (Signed)
Here to review 09/02/13 MRI. Reports occasional frontal headaches. Denies nausea, vomiting, or dizziness. Reports poor vision with floaters. Denies ringing in the ears. Reports she is sleepy today because she couldn't sleep last night. Explains she was anxious about today's appt. Denies pain.

## 2013-09-05 NOTE — Progress Notes (Signed)
Radiation Oncology         [phone]) [phone] ________________________________  Name: Courtney West MRN: [id number]  Date: 09/05/2013  DOB: [birth year]  Multidisciplinary Neuro Oncology Clinic Follow-Up Visit Note  CC: Shirline Frees, MD  Shirline Frees, MD  Diagnosis:   63 year old woman with metastatic lung cancer status post:  1. SRS to a 10-mm left frontal and 5-mm right frontal brain metastasis to 20 Gy on January 16, 2011.  2. The patient's primary lung cancer was treated to 66 Gy in 33 fractions of 2 Gy through March 10, 2011 . 3. SRS to 6 new brain metastases (2 adjacent left temporal brain metastases, a 4.7-mm vermis target, a 3.4-mm right frontal brain metastasis, a left parietal 5.3-mm brain metastasis, and a 6.6-mm left cerebellar brain metastasis) received 20 Gy in a single fraction.April 18, 2011. 4. SRS to a 4 mm posterior frontal target to 20 Gy on 10/20/2011 5. SBRT to a 1.6 cm metachronous clinical stage IA primary cancer of the right lower lung on 01/19/12, 01/21/12, and 01/26/12 to 54 gray in 3 fractions of 18 gray   Interval Since Last Radiation:  20  months  Narrative:  The patient returns today for routine follow-up.  The recent films were presented in our multidisciplinary conference with neuroradiology just prior to the clinic.  Reports occasional frontal headaches. Denies nausea, vomiting, or dizziness. Reports poor vision with floaters. Denies ringing in the ears. Reports she is sleepy today because she couldn't sleep last night. Explains she was anxious about today's appt. Denies pain.                                ALLERGIES:  is allergic to carboplatin; codeine; sulfa antibiotics; dilaudid; and penicillins.  Meds: Current Outpatient Prescriptions  Medication Sig Dispense Refill  . amitriptyline (ELAVIL) 100 MG tablet Take 200 mg by mouth at bedtime.        . budesonide-formoterol (SYMBICORT) 80-4.5 MCG/ACT inhaler Inhale 2 puffs into the lungs as needed.  1  Inhaler  5  . gabapentin (NEURONTIN) 300 MG capsule Take 2 capsules (600 mg total) by mouth 3 (three) times daily.  180 capsule  2  . HYDROcodone-acetaminophen (NORCO/VICODIN) 5-325 MG per tablet Take 1-2 tablets by mouth every 6 (six) hours as needed for moderate pain.  90 tablet  0  . lidocaine-prilocaine (EMLA) cream Apply topically as needed.  30 g  0  . ibuprofen (ADVIL,MOTRIN) 200 MG tablet Take 600 mg by mouth every 6 (six) hours as needed for headache.       No current facility-administered medications for this encounter.    Physical Findings: The patient is in no acute distress. Patient is alert and oriented.  blood pressure is 118/73 and her pulse is 85. Her respiration is 16. .  No significant changes.  Lab Findings: Lab Results  Component Value Date   WBC 9.4 08/15/2013   HGB 16.8* 08/15/2013   HCT 49.3* 08/15/2013   MCV 92.9 08/15/2013   PLT 182 08/15/2013    @LASTCHEM @  Radiographic Findings: Ct Chest W Contrast  08/15/2013   CLINICAL DATA:  Lung cancer restaging. Chemotherapy and radiation therapy complete.  EXAM: CT CHEST, ABDOMEN, AND PELVIS WITH CONTRAST  TECHNIQUE: Multidetector CT imaging of the chest, abdomen and pelvis was performed following the standard protocol during bolus administration of intravenous contrast.  CONTRAST:  169mL OMNIPAQUE IOHEXOL 300 MG/ML  SOLN  COMPARISON:  CT  CHEST W/CM dated 04/13/2013  FINDINGS: CT CHEST FINDINGS  There is a port in the right anterior chest wall. No axillary or supraclavicular lymphadenopathy. No mediastinal hilar lymphadenopathy. There is a moderate size pericardial effusion minimally increased and volume compared to prior. Effusion measures 19 mm in thickness along the right ventricular wall compared to 15 mm on prior. No central pulmonary embolism.  Review of the lung parenchyma demonstrates severe centrilobular emphysema. There is a subpleural nodule in the right upper lobe measuring 4 mm unchanged from prior. There is a  reticular pattern in the left and right lower lobes extending from the hilum with some perihilar consolidation which is also unchanged from prior and most suggestive of radiation change. No new nodularity.  CT ABDOMEN AND PELVIS FINDINGS  There is no focal hepatic lesion. Post cholecystectomy. The pancreas, spleen, adrenal glands, kidneys are unchanged. Linear calcifications in the lateral aspect of the spleen are unchanged.  The stomach, small bowel, appendix, cecum normal. The colon and rectosigmoid colon are normal.  Abdominal aorta is aneurysmal 15 mm unchanged from prior. No retroperitoneal periportal lymphadenopathy. Mesenteric adenopathy.  The post hysterectomy anatomy. The bladder is normal. No pelvic lymphadenopathy. No aggressive osseous lesion. Chronic compression fracture at L4 is unchanged.  IMPRESSION: 1. No evidence of lung cancer recurrence in the chest, abdomen, or pelvis. 2. Perihilar and lower lobe particular fibrotic pattern most suggestive of radiation change. 3. Slight increase in pericardial effusion. 4. Sable abdominal aortic aneurysm.   Electronically Signed   By: Suzy Bouchard M.D.   On: 08/15/2013 15:01   Mr Jeri Cos OX Contrast  09/02/2013   CLINICAL DATA:  S RS staging. Metastatic lung cancer. Follow-up 05/27/2013  EXAM: MRI HEAD WITHOUT AND WITH CONTRAST  TECHNIQUE: Multiplanar, multiecho pulse sequences of the brain and surrounding structures were obtained without and with intravenous contrast.  CONTRAST:  71mL MULTIHANCE GADOBENATE DIMEGLUMINE 529 MG/ML IV SOLN  COMPARISON:  05/27/2013 and multiple previous  FINDINGS: The study appears precisely unchanged from the last examination.  Five enhancing brain lesions measure the same. Findings are described on the axial thin section contrast-enhanced sequence. Image number 41 shows a left cerebellar lesion measuring 5.3 mm in diameter. Image 51 shows a left temporal lesion measuring 3.3 x 4 mm. Image 88 shows a 4.7 x 8 mm right frontal  lesion. Image 89 shows a 2 x 3 mm right frontal lesion. Image 107 shows a 6.1 x 7 mm left parietal lesion. No new or growing lesion.  Chronic atrophy and T2 hyperintensity of the lateral right cerebellum is unchanged. No hydrocephalus or extra-axial collection. No skull or skullbase lesion.  IMPRESSION: No change since last exam.  Five enhancing brain lesions are stable.   Electronically Signed   By: Nelson Chimes M.D.   On: 09/02/2013 14:43   Ct Abdomen Pelvis W Contrast  08/15/2013   CLINICAL DATA:  Lung cancer restaging. Chemotherapy and radiation therapy complete.  EXAM: CT CHEST, ABDOMEN, AND PELVIS WITH CONTRAST  TECHNIQUE: Multidetector CT imaging of the chest, abdomen and pelvis was performed following the standard protocol during bolus administration of intravenous contrast.  CONTRAST:  130mL OMNIPAQUE IOHEXOL 300 MG/ML  SOLN  COMPARISON:  CT CHEST W/CM dated 04/13/2013  FINDINGS: CT CHEST FINDINGS  There is a port in the right anterior chest wall. No axillary or supraclavicular lymphadenopathy. No mediastinal hilar lymphadenopathy. There is a moderate size pericardial effusion minimally increased and volume compared to prior. Effusion measures 19 mm in thickness along  the right ventricular wall compared to 15 mm on prior. No central pulmonary embolism.  Review of the lung parenchyma demonstrates severe centrilobular emphysema. There is a subpleural nodule in the right upper lobe measuring 4 mm unchanged from prior. There is a reticular pattern in the left and right lower lobes extending from the hilum with some perihilar consolidation which is also unchanged from prior and most suggestive of radiation change. No new nodularity.  CT ABDOMEN AND PELVIS FINDINGS  There is no focal hepatic lesion. Post cholecystectomy. The pancreas, spleen, adrenal glands, kidneys are unchanged. Linear calcifications in the lateral aspect of the spleen are unchanged.  The stomach, small bowel, appendix, cecum normal. The  colon and rectosigmoid colon are normal.  Abdominal aorta is aneurysmal 15 mm unchanged from prior. No retroperitoneal periportal lymphadenopathy. Mesenteric adenopathy.  The post hysterectomy anatomy. The bladder is normal. No pelvic lymphadenopathy. No aggressive osseous lesion. Chronic compression fracture at L4 is unchanged.  IMPRESSION: 1. No evidence of lung cancer recurrence in the chest, abdomen, or pelvis. 2. Perihilar and lower lobe particular fibrotic pattern most suggestive of radiation change. 3. Slight increase in pericardial effusion. 4. Sable abdominal aortic aneurysm.   Electronically Signed   By: Suzy Bouchard M.D.   On: 08/15/2013 15:01   Impression:  The patient is recovering from the effects of radiation.  Her MRI has remained stable with no active tumor growth.  Plan:  MRI in 4 months, then follow-up.  _____________________________________  Sheral Apley. Tammi Klippel, M.D.

## 2013-09-29 ENCOUNTER — Other Ambulatory Visit: Payer: Self-pay | Admitting: Radiation Therapy

## 2013-09-29 DIAGNOSIS — C7949 Secondary malignant neoplasm of other parts of nervous system: Principal | ICD-10-CM

## 2013-09-29 DIAGNOSIS — C7931 Secondary malignant neoplasm of brain: Secondary | ICD-10-CM

## 2013-10-03 ENCOUNTER — Ambulatory Visit: Payer: Medicare Other | Attending: Radiation Oncology | Admitting: Radiation Oncology

## 2013-10-03 ENCOUNTER — Telehealth: Payer: Self-pay | Admitting: Radiation Oncology

## 2013-10-03 NOTE — Telephone Encounter (Signed)
Left message with patient earlier today because she hadn't shown for follow up appointment. Patient reports she was asleep when I called since she was up until 0400. Offered to reschedule follow up for back pain. She reports she no longer needs to see Dr. Tammi Klippel. She states,"I got a B-active knee brace from the drug store and it has helped a lot." Also, patient reports she picked up her symbicort. Patient understands to call with future needs but, confirms she doesn't need to be seen by Dr. Tammi Klippel at this time.

## 2013-10-13 ENCOUNTER — Other Ambulatory Visit: Payer: Self-pay | Admitting: Radiation Oncology

## 2013-10-13 DIAGNOSIS — Z923 Personal history of irradiation: Secondary | ICD-10-CM

## 2013-10-13 DIAGNOSIS — C719 Malignant neoplasm of brain, unspecified: Secondary | ICD-10-CM

## 2013-10-13 MED ORDER — GABAPENTIN 300 MG PO CAPS: 600.0000 mg | ORAL_CAPSULE | Freq: Three times a day (TID) | ORAL | Status: DC

## 2013-10-14 ENCOUNTER — Telehealth: Payer: Self-pay | Admitting: Radiation Oncology

## 2013-10-14 NOTE — Telephone Encounter (Signed)
Phoned patient's home. Informed patient her neurontin has been escribed to [facility] in [place]. She verbalized understanding and expressed appreciation for the call.

## 2013-11-08 ENCOUNTER — Telehealth: Payer: Self-pay | Admitting: Radiation Oncology

## 2013-11-08 ENCOUNTER — Other Ambulatory Visit: Payer: Self-pay | Admitting: Radiation Oncology

## 2013-11-08 DIAGNOSIS — C7949 Secondary malignant neoplasm of other parts of nervous system: Secondary | ICD-10-CM

## 2013-11-08 DIAGNOSIS — C3490 Malignant neoplasm of unspecified part of unspecified bronchus or lung: Secondary | ICD-10-CM

## 2013-11-08 DIAGNOSIS — C7931 Secondary malignant neoplasm of brain: Secondary | ICD-10-CM

## 2013-11-08 MED ORDER — HYDROCODONE-ACETAMINOPHEN 5-325 MG PO TABS: 1.0000 | ORAL_TABLET | Freq: Four times a day (QID) | ORAL | Status: DC | PRN

## 2013-11-08 NOTE — Telephone Encounter (Signed)
Phoned patient making her aware that her vicodin script is at the rad onc nursing station ready for pick up. Patient verbalized understanding.

## 2013-11-17 ENCOUNTER — Telehealth: Payer: Self-pay | Admitting: Radiation Oncology

## 2013-11-17 NOTE — Telephone Encounter (Signed)
Placed PHYSICIAN ORDER FOR LUMBAR ORTHOSIS BRACE on Dr. Johny Shears desk to sign.

## 2013-11-23 ENCOUNTER — Telehealth: Payer: Self-pay | Admitting: Radiation Oncology

## 2013-11-23 NOTE — Telephone Encounter (Signed)
[facility] phoned a second time today requesting back brace form be completed and faxed back. Explained that Dr. Tammi Klippel isn't finished with this form yet but, when he does it will be faxed promptly.

## 2013-11-26 ENCOUNTER — Emergency Department (HOSPITAL_COMMUNITY)
Admission: EM | Admit: 2013-11-26 | Discharge: 2013-11-26 | Disposition: A | Discharge status: Home / Self Care | Payer: Medicare Other | Attending: Emergency Medicine | Admitting: Emergency Medicine

## 2013-11-26 ENCOUNTER — Emergency Department (HOSPITAL_COMMUNITY): Payer: Medicare Other

## 2013-11-26 ENCOUNTER — Encounter (HOSPITAL_COMMUNITY): Payer: Self-pay | Admitting: Emergency Medicine

## 2013-11-26 DIAGNOSIS — Z8639 Personal history of other endocrine, nutritional and metabolic disease: Secondary | ICD-10-CM | POA: Insufficient documentation

## 2013-11-26 DIAGNOSIS — IMO0002 Reserved for concepts with insufficient information to code with codable children: Secondary | ICD-10-CM | POA: Insufficient documentation

## 2013-11-26 DIAGNOSIS — I10 Essential (primary) hypertension: Secondary | ICD-10-CM | POA: Insufficient documentation

## 2013-11-26 DIAGNOSIS — Z923 Personal history of irradiation: Secondary | ICD-10-CM | POA: Insufficient documentation

## 2013-11-26 DIAGNOSIS — Z862 Personal history of diseases of the blood and blood-forming organs and certain disorders involving the immune mechanism: Secondary | ICD-10-CM | POA: Insufficient documentation

## 2013-11-26 DIAGNOSIS — Z85841 Personal history of malignant neoplasm of brain: Secondary | ICD-10-CM | POA: Insufficient documentation

## 2013-11-26 DIAGNOSIS — R071 Chest pain on breathing: Secondary | ICD-10-CM | POA: Insufficient documentation

## 2013-11-26 DIAGNOSIS — J441 Chronic obstructive pulmonary disease with (acute) exacerbation: Secondary | ICD-10-CM | POA: Insufficient documentation

## 2013-11-26 DIAGNOSIS — Z880 Allergy status to penicillin: Secondary | ICD-10-CM | POA: Insufficient documentation

## 2013-11-26 DIAGNOSIS — Z8673 Personal history of transient ischemic attack (TIA), and cerebral infarction without residual deficits: Secondary | ICD-10-CM | POA: Insufficient documentation

## 2013-11-26 DIAGNOSIS — Z79899 Other long term (current) drug therapy: Secondary | ICD-10-CM | POA: Insufficient documentation

## 2013-11-26 DIAGNOSIS — J40 Bronchitis, not specified as acute or chronic: Secondary | ICD-10-CM

## 2013-11-26 DIAGNOSIS — Z85118 Personal history of other malignant neoplasm of bronchus and lung: Secondary | ICD-10-CM | POA: Insufficient documentation

## 2013-11-26 DIAGNOSIS — F17200 Nicotine dependence, unspecified, uncomplicated: Secondary | ICD-10-CM | POA: Insufficient documentation

## 2013-11-26 DIAGNOSIS — Z791 Long term (current) use of non-steroidal anti-inflammatories (NSAID): Secondary | ICD-10-CM | POA: Insufficient documentation

## 2013-11-26 DIAGNOSIS — R0789 Other chest pain: Secondary | ICD-10-CM

## 2013-11-26 MED ORDER — CYCLOBENZAPRINE HCL 10 MG PO TABS: 10.0000 mg | ORAL_TABLET | Freq: Three times a day (TID) | ORAL | Status: DC | PRN

## 2013-11-26 MED ORDER — NAPROXEN 500 MG PO TABS: 500.0000 mg | ORAL_TABLET | Freq: Two times a day (BID) | ORAL | Status: DC

## 2013-11-26 MED ORDER — CYCLOBENZAPRINE HCL 10 MG PO TABS
10.0000 mg | ORAL_TABLET | Freq: Once | ORAL | Status: AC
  Administered 2013-11-26: 10 mg via ORAL
  Filled 2013-11-26: qty 1

## 2013-11-26 MED ORDER — DM-GUAIFENESIN ER 30-600 MG PO TB12: 1.0000 | ORAL_TABLET | Freq: Two times a day (BID) | ORAL | Status: DC | PRN

## 2013-11-26 MED ORDER — NAPROXEN 250 MG PO TABS
500.0000 mg | ORAL_TABLET | Freq: Once | ORAL | Status: AC
  Administered 2013-11-26: 500 mg via ORAL
  Filled 2013-11-26: qty 2

## 2013-11-26 MED ORDER — AEROCHAMBER Z-STAT PLUS/MEDIUM MISC: 1.0000 | Freq: Once | Status: DC

## 2013-11-26 MED ORDER — ALBUTEROL SULFATE HFA 108 (90 BASE) MCG/ACT IN AERS: 2.0000 | INHALATION_SPRAY | RESPIRATORY_TRACT | Status: DC | PRN

## 2013-11-26 MED ORDER — ALBUTEROL SULFATE (2.5 MG/3ML) 0.083% IN NEBU
2.5000 mg | INHALATION_SOLUTION | Freq: Once | RESPIRATORY_TRACT | Status: AC
  Administered 2013-11-26: 2.5 mg via RESPIRATORY_TRACT
  Filled 2013-11-26: qty 3

## 2013-11-26 MED ORDER — IPRATROPIUM-ALBUTEROL 0.5-2.5 (3) MG/3ML IN SOLN
3.0000 mL | Freq: Once | RESPIRATORY_TRACT | Status: AC
  Administered 2013-11-26: 3 mL via RESPIRATORY_TRACT
  Filled 2013-11-26: qty 3

## 2013-11-26 MED ORDER — IPRATROPIUM BROMIDE 0.02 % IN SOLN: 0.5000 mg | Freq: Once | RESPIRATORY_TRACT | Status: DC

## 2013-11-26 MED ORDER — ALBUTEROL SULFATE (2.5 MG/3ML) 0.083% IN NEBU: 5.0000 mg | INHALATION_SOLUTION | Freq: Once | RESPIRATORY_TRACT | Status: DC

## 2013-11-26 MED ORDER — AZITHROMYCIN 250 MG PO TABS: ORAL_TABLET | ORAL | Status: DC

## 2013-11-26 NOTE — ED Notes (Signed)
Pt states she coughed real hard and think she broke a rib on the left

## 2013-11-26 NOTE — ED Notes (Signed)
Family at bedside. Patient's family brought [employer]

## 2013-11-26 NOTE — ED Provider Notes (Signed)
CSN: [id number]     Arrival date & time 11/26/13  1813 History   First MD Initiated Contact with Patient 11/26/13 1844     Chief Complaint  Patient presents with  . Chest Pain     (Consider location/radiation/quality/duration/timing/severity/associated sxs/prior Treatment) HPI Patient reports she started having a cough about a week ago. She states today she coughed really hard and she felt like she had a ripping sensation in her left lateral chest wall. She denies any known wheezing however she can't take a big deep breath. She states her cough has been dry without fever. She denies sore throat, rhinorrhea, nausea, vomiting, or diarrhea. Patient states she has stage IV lung cancer however she stopped her chemotherapy and radiation therapy a year ago. She states she takes Symbicort on a daily basis. She does not use albuterol.  PCP none Oncologist Dr. Julien Nordmann Radiation oncologist Dr. Tammi Klippel Neurosurgeon Dr. Cyndy Freeze   Past Medical History  Diagnosis Date  . Hypertension   . COPD (chronic obstructive pulmonary disease)   . Stroke   . Abdominal aortic aneurysm   . Dyslipidemia   . S/P radiation therapy 7/12 thru 9/12, 04/18/11    xrt to brain mets  . Lung cancer 12/2010  . Brain cancer     mets from lung primary  . Allergy     codeine,pcn,sulfa drugs  . Radiation 10/20/2011    frontal mets/Palliation  . History of radiation therapy 01/19/12, 01/21/12, 01/26/12    RLL lung  . History of radiation therapy 04/18/11    single fraction to 6 brain mets  . Clotted vascular catheter 03/30/2012  . Lung cancer 12/22/2010   Past Surgical History  Procedure Laterality Date  . Carpal tunnel release    . Left knee surgery    . Cholecystectomy    . Partial hysterectomy     Family History  Problem Relation Age of Onset  . Cancer Father     lung, leukemia  . Cancer Maternal Aunt     kidney   History  Substance Use Topics  . Smoking status: Current Every Day Smoker -- 1.00 packs/day for 50  years    Types: Cigarettes  . Smokeless tobacco: Not on file  . Alcohol Use: No   Patient and her sister live together Patient uses oxygen 2 L per minute nasal cannula mainly at night and sometimes during the day  OB History   Grav Para Term Preterm Abortions TAB SAB Ect Mult Living                 Review of Systems  All other systems reviewed and are negative.     Allergies  Carboplatin; Codeine; Sulfa antibiotics; Dilaudid; and Penicillins  Home Medications   Prior to Admission medications   Medication Sig Start Date End Date Taking? Authorizing Provider  budesonide-formoterol (SYMBICORT) 80-4.5 MCG/ACT inhaler Inhale 2 puffs into the lungs as needed. 05/30/13  Yes Lora Paula, MD  gabapentin (NEURONTIN) 300 MG capsule Take 2 capsules (600 mg total) by mouth 3 (three) times daily. 10/13/13  Yes Lora Paula, MD  HYDROcodone-acetaminophen (NORCO/VICODIN) 5-325 MG per tablet Take 1-2 tablets by mouth every 6 (six) hours as needed for moderate pain. 11/08/13  Yes Lora Paula, MD  ibuprofen (ADVIL,MOTRIN) 200 MG tablet Take 600 mg by mouth every 6 (six) hours as needed for headache.   Yes Historical Provider, MD  amitriptyline (ELAVIL) 100 MG tablet Take 200 mg by mouth at bedtime.  Historical Provider, MD   BP 155/88  Pulse 72  Temp(Src) 98 F (36.7 C) (Oral)  Resp 20  Ht 5\' 3"  (1.6 m)  Wt 193 lb (87.544 kg)  BMI 34.20 kg/m2  SpO2 87% then improved to 92%  Vital signs normal except for hypoxia  Physical Exam  Nursing note and vitals reviewed. Constitutional: She is oriented to person, place, and time. She appears well-developed and well-nourished.  Non-toxic appearance. She does not appear ill. No distress.  HENT:  Head: Normocephalic and atraumatic.  Right Ear: External ear normal.  Left Ear: External ear normal.  Nose: Nose normal. No mucosal edema or rhinorrhea.  Mouth/Throat: Oropharynx is clear and moist and mucous membranes are normal. No  dental abscesses or uvula swelling.  Eyes: Conjunctivae and EOM are normal. Pupils are equal, round, and reactive to light.  Neck: Normal range of motion and full passive range of motion without pain. Neck supple.  Cardiovascular: Normal rate, regular rhythm and normal heart sounds.  Exam reveals no gallop and no friction rub.   No murmur heard. Pulmonary/Chest: Effort normal and breath sounds normal. No respiratory distress. She has no wheezes. She has no rhonchi. She has no rales. She exhibits no tenderness and no crepitus.    Patient has tenderness over her left lateral inferior chest wall without bruising or crepitance. She has marked decreased breath sounds. She appears to have some shortness of breath.  Abdominal: Soft. Normal appearance and bowel sounds are normal. She exhibits no distension. There is no tenderness. There is no rebound and no guarding.  Musculoskeletal: Normal range of motion. She exhibits no edema and no tenderness.  Moves all extremities well.   Neurological: She is alert and oriented to person, place, and time. She has normal strength. No cranial nerve deficit.  Skin: Skin is warm, dry and intact. No rash noted. No erythema. No pallor.  Psychiatric: She has a normal mood and affect. Her speech is normal and behavior is normal. Her mood appears not anxious.    ED Course  Procedures (including critical care time)  Medications  aerochamber Z-Stat Plus/medium 1 each (not administered)  naproxen (NAPROSYN) tablet 500 mg (500 mg Oral Given 11/26/13 1927)  cyclobenzaprine (FLEXERIL) tablet 10 mg (10 mg Oral Given 11/26/13 1927)  ipratropium-albuterol (DUONEB) 0.5-2.5 (3) MG/3ML nebulizer solution 3 mL (3 mLs Nebulization Given 11/26/13 1935)  albuterol (PROVENTIL) (2.5 MG/3ML) 0.083% nebulizer solution 2.5 mg (2.5 mg Nebulization Given 11/26/13 1935)   Checked after her nebulizer treatment. She has some improved air movement. She still has some restriction because it hurts to  breathe deeply. Patient does have home oxygen that she can wear. Her pulse ox did drop into the 80s in the ED.  Labs Review Labs Reviewed - No data to display  Imaging Review Dg Ribs Unilateral W/chest Left  11/26/2013   CLINICAL DATA:  Left rib and chest pain.  Cough.  Lung carcinoma.  EXAM: LEFT RIBS AND CHEST - 3+ VIEW  COMPARISON:  09/06/2011 and more recent chest CT on 08/15/2013  FINDINGS: No fracture or other bone lesions seen involving the left ribs. No evidence of pneumothorax hemothorax.  Heart size remains within normal limits. Post treatment changes in the left hemithorax again demonstrated. Right basilar scarring also noted. No evidence of acute infiltrate or pleural effusion. Right-sided Port-A-Cath remains in appropriate position.  IMPRESSION: No acute findings.  Stable post treatment changes in left hemithorax and right basilar scarring.   Electronically Signed   By:  Earle Gell M.D.   On: 11/26/2013 19:01     EKG Interpretation None      MDM   Final diagnoses:  Bronchitis  Chest wall pain    New Prescriptions   ALBUTEROL (PROVENTIL HFA;VENTOLIN HFA) 108 (90 BASE) MCG/ACT INHALER    Inhale 2 puffs into the lungs every 4 (four) hours as needed for wheezing or shortness of breath.   AZITHROMYCIN (ZITHROMAX) 250 MG TABLET    Take 2 po the first day then once a day for the next 4 days.   CYCLOBENZAPRINE (FLEXERIL) 10 MG TABLET    Take 1 tablet (10 mg total) by mouth 3 (three) times daily as needed for muscle spasms.   DEXTROMETHORPHAN-GUAIFENESIN (MUCINEX DM) 30-600 MG PER 12 HR TABLET    Take 1 tablet by mouth 2 (two) times daily as needed for cough.   NAPROXEN (NAPROSYN) 500 MG TABLET    Take 1 tablet (500 mg total) by mouth 2 (two) times daily with a meal.    Plan discharge   Rolland Porter, MD, Alanson Aly, MD 11/26/13 2031

## 2013-11-26 NOTE — ED Notes (Signed)
Pt's RA sats at 81%, pt states that she is suppose to wear all the time but only wears at night as needed per pt, sats increased to 87% at present and respiratory made aware as well

## 2013-11-26 NOTE — Discharge Instructions (Signed)
Use the inhaler for wheezing or shortness of breath. Take the mucinex DM for cough. Take the naproxen and flexeril for your chest wall pain. Take the antibiotic until gone. Recheck if you get a high fever, struggle to breathe or seem worse.    Bronchitis Bronchitis is inflammation of the airways that extend from the windpipe into the lungs (bronchi). The inflammation often causes mucus to develop, which leads to a cough. If the inflammation becomes severe, it may cause shortness of breath. CAUSES  Bronchitis may be caused by:   Viral infections.   Bacteria.   Cigarette smoke.   Allergens, pollutants, and other irritants.  SIGNS AND SYMPTOMS  The most common symptom of bronchitis is a frequent cough that produces mucus. Other symptoms include:  Fever.   Body aches.   Chest congestion.   Chills.   Shortness of breath.   Sore throat.  DIAGNOSIS  Bronchitis is usually diagnosed through a medical history and physical exam. Tests, such as chest X-rays, are sometimes done to rule out other conditions.  TREATMENT  You may need to avoid contact with whatever caused the problem (smoking, for example). Medicines are sometimes needed. These may include:  Antibiotics. These may be prescribed if the condition is caused by bacteria.  Cough suppressants. These may be prescribed for relief of cough symptoms.   Inhaled medicines. These may be prescribed to help open your airways and make it easier for you to breathe.   Steroid medicines. These may be prescribed for those with recurrent (chronic) bronchitis. HOME CARE INSTRUCTIONS  Get plenty of rest.   Drink enough fluids to keep your urine clear or pale yellow (unless you have a medical condition that requires fluid restriction). Increasing fluids may help thin your secretions and will prevent dehydration.   Only take over-the-counter or prescription medicines as directed by your health care provider.  Only take  antibiotics as directed. Make sure you finish them even if you start to feel better.  Avoid secondhand smoke, irritating chemicals, and strong fumes. These will make bronchitis worse. If you are a smoker, quit smoking. Consider using nicotine gum or skin patches to help control withdrawal symptoms. Quitting smoking will help your lungs heal faster.   Put a cool-mist humidifier in your bedroom at night to moisten the air. This may help loosen mucus. Change the water in the humidifier daily. You can also run the hot water in your shower and sit in the bathroom with the door closed for 5 10 minutes.   Follow up with your health care provider as directed.   Wash your hands frequently to avoid catching bronchitis again or spreading an infection to others.  SEEK MEDICAL CARE IF: Your symptoms do not improve after 1 week of treatment.  SEEK IMMEDIATE MEDICAL CARE IF:  Your fever increases.  You have chills.   You have chest pain.   You have worsening shortness of breath.   You have bloody sputum.  You faint.  You have lightheadedness.  You have a severe headache.   You vomit repeatedly. MAKE SURE YOU:   Understand these instructions.  Will watch your condition.  Will get help right away if you are not doing well or get worse. Document Released: 06/09/2005 Document Revised: 03/30/2013 Document Reviewed: 02/01/2013 [facility] Patient Information 2014 [place].  Chest Wall Pain Chest wall pain is pain in or around the bones and muscles of your chest. It may take up to 6 weeks to get better. It may  take longer if you must stay physically active in your work and activities.  CAUSES  Chest wall pain may happen on its own. However, it may be caused by:  A viral illness like the flu.  Injury.  Coughing.  Exercise.  Arthritis.  Fibromyalgia.  Shingles. HOME CARE INSTRUCTIONS   Avoid overtiring physical activity. Try not to strain or perform activities that  cause pain. This includes any activities using your chest or your abdominal and side muscles, especially if heavy weights are used.  Put ice on the sore area.  Put ice in a plastic bag.  Place a towel between your skin and the bag.  Leave the ice on for 15-20 minutes per hour while awake for the first 2 days.  Only take over-the-counter or prescription medicines for pain, discomfort, or fever as directed by your caregiver. SEEK IMMEDIATE MEDICAL CARE IF:   Your pain increases, or you are very uncomfortable.  You have a fever.  Your chest pain becomes worse.  You have new, unexplained symptoms.  You have nausea or vomiting.  You feel sweaty or lightheaded.  You have a cough with phlegm (sputum), or you cough up blood. MAKE SURE YOU:   Understand these instructions.  Will watch your condition.  Will get help right away if you are not doing well or get worse. Document Released: 06/09/2005 Document Revised: 09/01/2011 Document Reviewed: 02/03/2011 [facility] Patient Information 2014 [place], [place].

## 2013-11-29 ENCOUNTER — Telehealth: Payer: Self-pay | Admitting: Radiation Oncology

## 2013-11-29 NOTE — Telephone Encounter (Signed)
Faxed complete PHYSICIAN ORDER FOR LUMBAR ORTHOSIS BRACE to [phone]. Confirmation fax of delivery obtained.

## 2013-12-01 ENCOUNTER — Telehealth: Payer: Self-pay | Admitting: *Deleted

## 2013-12-01 NOTE — Telephone Encounter (Signed)
Received call from [place] at [facility] [place] [phone].  She states that pt is requesting a refill on her albuterol inhaler.  Per dr Vista Mink, pt needs to get refills with PCP or pulmonologist.  Informed Jodie.  SLJ

## 2013-12-09 ENCOUNTER — Telehealth: Payer: Self-pay | Admitting: Radiation Oncology

## 2013-12-09 NOTE — Telephone Encounter (Signed)
Faxed spine scans to [facility] at [phone] as proof of the need for a back brace. Fax confirmation of delivery obtained.

## 2013-12-14 ENCOUNTER — Encounter (HOSPITAL_COMMUNITY): Payer: Self-pay

## 2013-12-14 ENCOUNTER — Other Ambulatory Visit (HOSPITAL_BASED_OUTPATIENT_CLINIC_OR_DEPARTMENT_OTHER): Payer: Medicare Other

## 2013-12-14 ENCOUNTER — Other Ambulatory Visit: Payer: Self-pay | Admitting: Internal Medicine

## 2013-12-14 ENCOUNTER — Ambulatory Visit (HOSPITAL_COMMUNITY)
Admission: RE | Admit: 2013-12-14 | Discharge: 2013-12-14 | Disposition: A | Discharge status: Home / Self Care | Payer: Medicare Other | Source: Ambulatory Visit | Attending: Internal Medicine | Admitting: Internal Medicine

## 2013-12-14 DIAGNOSIS — K4090 Unilateral inguinal hernia, without obstruction or gangrene, not specified as recurrent: Secondary | ICD-10-CM | POA: Diagnosis not present

## 2013-12-14 DIAGNOSIS — Z90710 Acquired absence of both cervix and uterus: Secondary | ICD-10-CM | POA: Diagnosis not present

## 2013-12-14 DIAGNOSIS — C7949 Secondary malignant neoplasm of other parts of nervous system: Secondary | ICD-10-CM

## 2013-12-14 DIAGNOSIS — I714 Abdominal aortic aneurysm, without rupture, unspecified: Secondary | ICD-10-CM | POA: Diagnosis not present

## 2013-12-14 DIAGNOSIS — I2510 Atherosclerotic heart disease of native coronary artery without angina pectoris: Secondary | ICD-10-CM | POA: Diagnosis not present

## 2013-12-14 DIAGNOSIS — C3490 Malignant neoplasm of unspecified part of unspecified bronchus or lung: Secondary | ICD-10-CM | POA: Insufficient documentation

## 2013-12-14 DIAGNOSIS — I7409 Other arterial embolism and thrombosis of abdominal aorta: Secondary | ICD-10-CM | POA: Diagnosis not present

## 2013-12-14 DIAGNOSIS — Z9221 Personal history of antineoplastic chemotherapy: Secondary | ICD-10-CM | POA: Diagnosis not present

## 2013-12-14 DIAGNOSIS — C3430 Malignant neoplasm of lower lobe, unspecified bronchus or lung: Secondary | ICD-10-CM

## 2013-12-14 DIAGNOSIS — C7931 Secondary malignant neoplasm of brain: Secondary | ICD-10-CM

## 2013-12-14 DIAGNOSIS — I319 Disease of pericardium, unspecified: Secondary | ICD-10-CM | POA: Diagnosis not present

## 2013-12-14 DIAGNOSIS — M439 Deforming dorsopathy, unspecified: Secondary | ICD-10-CM | POA: Insufficient documentation

## 2013-12-14 DIAGNOSIS — Z923 Personal history of irradiation: Secondary | ICD-10-CM | POA: Diagnosis not present

## 2013-12-14 DIAGNOSIS — J438 Other emphysema: Secondary | ICD-10-CM | POA: Insufficient documentation

## 2013-12-14 DIAGNOSIS — I7090 Unspecified atherosclerosis: Secondary | ICD-10-CM | POA: Diagnosis not present

## 2013-12-14 LAB — COMPREHENSIVE METABOLIC PANEL (CC13)
ALBUMIN: 3.8 g/dL (ref 3.5–5.0)
ALK PHOS: 103 U/L (ref 40–150)
ALT: 16 U/L (ref 0–55)
AST: 17 U/L (ref 5–34)
Anion Gap: 10 mEq/L (ref 3–11)
BUN: 17.7 mg/dL (ref 7.0–26.0)
CALCIUM: 10 mg/dL (ref 8.4–10.4)
CHLORIDE: 103 meq/L (ref 98–109)
CO2: 27 mEq/L (ref 22–29)
Creatinine: 0.9 mg/dL (ref 0.6–1.1)
Glucose: 95 mg/dl (ref 70–140)
POTASSIUM: 5 meq/L (ref 3.5–5.1)
SODIUM: 141 meq/L (ref 136–145)
TOTAL PROTEIN: 7.6 g/dL (ref 6.4–8.3)
Total Bilirubin: 0.32 mg/dL (ref 0.20–1.20)

## 2013-12-14 LAB — CBC WITH DIFFERENTIAL/PLATELET
BASO%: 1.2 % (ref 0.0–2.0)
Basophils Absolute: 0.1 10*3/uL (ref 0.0–0.1)
EOS%: 2.8 % (ref 0.0–7.0)
Eosinophils Absolute: 0.3 10*3/uL (ref 0.0–0.5)
HCT: 50.3 % — ABNORMAL HIGH (ref 34.8–46.6)
HGB: 16.8 g/dL — ABNORMAL HIGH (ref 11.6–15.9)
LYMPH#: 3.5 10*3/uL — AB (ref 0.9–3.3)
LYMPH%: 34.8 % (ref 14.0–49.7)
MCH: 31.6 pg (ref 25.1–34.0)
MCHC: 33.5 g/dL (ref 31.5–36.0)
MCV: 94.5 fL (ref 79.5–101.0)
MONO#: 0.7 10*3/uL (ref 0.1–0.9)
MONO%: 7.4 % (ref 0.0–14.0)
NEUT%: 53.8 % (ref 38.4–76.8)
NEUTROS ABS: 5.4 10*3/uL (ref 1.5–6.5)
Platelets: 182 10*3/uL (ref 145–400)
RBC: 5.32 10*6/uL (ref 3.70–5.45)
RDW: 13.5 % (ref 11.2–14.5)
WBC: 10.1 10*3/uL (ref 3.9–10.3)

## 2013-12-14 MED ORDER — IOHEXOL 300 MG/ML  SOLN
100.0000 mL | Freq: Once | INTRAMUSCULAR | Status: AC | PRN
  Administered 2013-12-14: 100 mL via INTRAVENOUS

## 2013-12-15 ENCOUNTER — Telehealth: Payer: Self-pay | Admitting: Internal Medicine

## 2013-12-15 ENCOUNTER — Ambulatory Visit (HOSPITAL_BASED_OUTPATIENT_CLINIC_OR_DEPARTMENT_OTHER): Payer: Medicare Other | Admitting: Internal Medicine

## 2013-12-15 ENCOUNTER — Encounter: Payer: Self-pay | Admitting: Internal Medicine

## 2013-12-15 VITALS — BP 137/61 | HR 85 | Temp 97.6°F | Resp 18 | Ht 63.0 in | Wt 192.6 lb

## 2013-12-15 DIAGNOSIS — Z85118 Personal history of other malignant neoplasm of bronchus and lung: Secondary | ICD-10-CM

## 2013-12-15 DIAGNOSIS — Z8673 Personal history of transient ischemic attack (TIA), and cerebral infarction without residual deficits: Secondary | ICD-10-CM

## 2013-12-15 DIAGNOSIS — J449 Chronic obstructive pulmonary disease, unspecified: Secondary | ICD-10-CM

## 2013-12-15 DIAGNOSIS — Z923 Personal history of irradiation: Secondary | ICD-10-CM

## 2013-12-15 DIAGNOSIS — C3490 Malignant neoplasm of unspecified part of unspecified bronchus or lung: Secondary | ICD-10-CM

## 2013-12-15 NOTE — Patient Instructions (Signed)
Smoking Cessation, Tips for Success If you are ready to quit smoking, congratulations! You have chosen to help yourself be healthier. Cigarettes bring nicotine, tar, carbon monoxide, and other irritants into your body. Your lungs, heart, and blood vessels will be able to work better without these poisons. There are many different ways to quit smoking. Nicotine gum, nicotine patches, a nicotine inhaler, or nicotine nasal spray can help with physical craving. Hypnosis, support groups, and medicines help break the habit of smoking. WHAT THINGS CAN I DO TO MAKE QUITTING EASIER?  Here are some tips to help you quit for good:  Pick a date when you will quit smoking completely. Tell all of your friends and family about your plan to quit on that date.  Do not try to slowly cut down on the number of cigarettes you are smoking. Pick a quit date and quit smoking completely starting on that day.  Throw away all cigarettes.   Clean and remove all ashtrays from your home, work, and car.   On a card, write down your reasons for quitting. Carry the card with you and read it when you get the urge to smoke.   Cleanse your body of nicotine. Drink enough water and fluids to keep your urine clear or pale yellow. Do this after quitting to flush the nicotine from your body.   Learn to predict your moods. Do not let a bad situation be your excuse to have a cigarette. Some situations in your life might tempt you into wanting a cigarette.   Never have "just one" cigarette. It leads to wanting another and another. Remind yourself of your decision to quit.   Change habits associated with smoking. If you smoked while driving or when feeling stressed, try other activities to replace smoking. Stand up when drinking your coffee. Brush your teeth after eating. Sit in a different chair when you read the paper. Avoid alcohol while trying to quit, and try to drink fewer caffeinated beverages. Alcohol and caffeine may urge  you to smoke.   Avoid foods and drinks that can trigger a desire to smoke, such as sugary or spicy foods and alcohol.   Ask people who smoke not to smoke around you.   Have something planned to do right after eating or having a cup of coffee. For example, plan to take a walk or exercise.   Try a relaxation exercise to calm you down and decrease your stress. Remember, you may be tense and nervous for the first 2 weeks after you quit, but this will pass.   Find new activities to keep your hands busy. Play with a pen, coin, or rubber band. Doodle or draw things on paper.   Brush your teeth right after eating. This will help cut down on the craving for the taste of tobacco after meals. You can also try mouthwash.   Use oral substitutes in place of cigarettes. Try using lemon drops, carrots, cinnamon sticks, or chewing gum. Keep them handy so they are available when you have the urge to smoke.   When you have the urge to smoke, try deep breathing.   Designate your home as a nonsmoking area.   If you are a heavy smoker, ask your health care provider about a prescription for nicotine chewing gum. It can ease your withdrawal from nicotine.   Reward yourself. Set aside the cigarette money you save and buy yourself something nice.   Look for support from others. Join a support group or   smoking cessation program. Ask someone at home or at work to help you with your plan to quit smoking.   Always ask yourself, "Do I need this cigarette or is this just a reflex?" Tell yourself, "Today, I choose not to smoke," or "I do not want to smoke." You are reminding yourself of your decision to quit.  Do not replace cigarette smoking with electronic cigarettes (commonly called e-cigarettes). The safety of e-cigarettes is unknown, and some may contain harmful chemicals.  If you relapse, do not give up! Plan ahead and think about what you will do the next time you get the urge to smoke.  HOW WILL  I FEEL WHEN I QUIT SMOKING? You may have symptoms of withdrawal because your body is used to nicotine (the addictive substance in cigarettes). You may crave cigarettes, be irritable, feel very hungry, cough often, get headaches, or have difficulty concentrating. The withdrawal symptoms are only temporary. They are strongest when you first quit but will go away within 10-14 days. When withdrawal symptoms occur, stay in control. Think about your reasons for quitting. Remind yourself that these are signs that your body is healing and getting used to being without cigarettes. Remember that withdrawal symptoms are easier to treat than the major diseases that smoking can cause.  Even after the withdrawal is over, expect periodic urges to smoke. However, these cravings are generally short lived and will go away whether you smoke or not. Do not smoke!  WHAT RESOURCES ARE AVAILABLE TO HELP ME QUIT SMOKING? Your health care provider can direct you to community resources or hospitals for support, which may include:  Group support.  Education.  Hypnosis.  Therapy. Document Released: 03/07/2004 Document Revised: 03/30/2013 Document Reviewed: 11/25/2012 [facility] Patient Information 2015 [place], [place]. This information is not intended to replace advice given to you by your health care provider. Make sure you discuss any questions you have with your health care provider.

## 2013-12-15 NOTE — Telephone Encounter (Signed)
gv adn printed appt sched and avs for pt for Dec....gv pt barium

## 2013-12-15 NOTE — Progress Notes (Signed)
[facility] [phone]   Fax:(336) [place], [place] [address] [place] [address]  DIAGNOSIS: Metastatic non-small cell lung cancer, favoring adenocarcinoma diagnosed in July of 2012, presented with locally advanced disease in the chest as well as brain metastasis. Veristrat test poor.   PRIOR THERAPY:  1. Status post stereotactic radiotherapy to 2 brain lesions under the care of Dr. Lisbeth Renshaw. 2. Status post a course of concurrent chemoradiation with weekly carboplatin and paclitaxel, last dose of chemotherapy was given 03/05/2011. 3. Systemic chemotherapy with carboplatin for AUC of 5 and Alimta 500 mg/M2. The patient is status post 3 cycles. Last dose was given 06/03/2011  CURRENT THERAPY: Observation.  INTERVAL HISTORY: Courtney West 63 y.o. female returns to the clinic today for routine four-month followup visit  The patient has no complaints today. Unfortunately she continues to smoke even after several counseling and recommendation to quit smoking. She denied having any significant chest pain, shortness of breath, cough or hemoptysis. She has no weight loss or night sweats. The patient had repeat CT scan of the chest, abdomen and pelvis performed recently and she is here for evaluation and discussion of her scan results.  MEDICAL HISTORY: Past Medical History  Diagnosis Date  . Hypertension   . COPD (chronic obstructive pulmonary disease)   . Stroke   . Abdominal aortic aneurysm   . Dyslipidemia   . S/P radiation therapy 7/12 thru 9/12, 04/18/11    xrt to brain mets  . Lung cancer 12/2010  . Brain cancer     mets from lung primary  . Allergy     codeine,pcn,sulfa drugs  . Radiation 10/20/2011    frontal mets/Palliation  . History of radiation therapy 01/19/12, 01/21/12, 01/26/12    RLL lung  . History of radiation therapy 04/18/11    single fraction to 6 brain mets  . Clotted vascular catheter  03/30/2012  . Lung cancer 12/22/2010    ALLERGIES:  is allergic to carboplatin; codeine; sulfa antibiotics; dilaudid; and penicillins.  MEDICATIONS:  Current Outpatient Prescriptions  Medication Sig Dispense Refill  . albuterol (PROVENTIL HFA;VENTOLIN HFA) 108 (90 BASE) MCG/ACT inhaler Inhale 2 puffs into the lungs every 4 (four) hours as needed for wheezing or shortness of breath.  6.7 g  0  . amitriptyline (ELAVIL) 100 MG tablet Take 200 mg by mouth at bedtime.        Marland Kitchen azithromycin (ZITHROMAX) 250 MG tablet Take 2 po the first day then once a day for the next 4 days.  6 tablet  0  . budesonide-formoterol (SYMBICORT) 80-4.5 MCG/ACT inhaler Inhale 2 puffs into the lungs as needed.  1 Inhaler  5  . cyclobenzaprine (FLEXERIL) 10 MG tablet Take 1 tablet (10 mg total) by mouth 3 (three) times daily as needed for muscle spasms.  30 tablet  0  . dextromethorphan-guaiFENesin (MUCINEX DM) 30-600 MG per 12 hr tablet Take 1 tablet by mouth 2 (two) times daily as needed for cough.  20 tablet  0  . gabapentin (NEURONTIN) 300 MG capsule Take 2 capsules (600 mg total) by mouth 3 (three) times daily.  180 capsule  5  . HYDROcodone-acetaminophen (NORCO/VICODIN) 5-325 MG per tablet Take 1-2 tablets by mouth every 6 (six) hours as needed for moderate pain.  90 tablet  0  . ibuprofen (ADVIL,MOTRIN) 200 MG tablet Take 600 mg by mouth every 6 (six) hours as needed for  headache.      . naproxen (NAPROSYN) 500 MG tablet Take 1 tablet (500 mg total) by mouth 2 (two) times daily with a meal.  30 tablet  0   No current facility-administered medications for this visit.    SURGICAL HISTORY:  Past Surgical History  Procedure Laterality Date  . Carpal tunnel release    . Left knee surgery    . Cholecystectomy    . Partial hysterectomy      REVIEW OF SYSTEMS:  A comprehensive review of systems was negative except for: Constitutional: positive for fatigue Respiratory: positive for dyspnea on exertion   PHYSICAL  EXAMINATION: General appearance: alert, cooperative, fatigued and no distress Head: Normocephalic, without obvious abnormality, atraumatic Neck: no adenopathy, no JVD, supple, symmetrical, trachea midline and thyroid not enlarged, symmetric, no tenderness/mass/nodules Lymph nodes: Cervical, supraclavicular, and axillary nodes normal. Resp: clear to auscultation bilaterally Back: symmetric, no curvature. ROM normal. No CVA tenderness. Cardio: regular rate and rhythm, S1, S2 normal, no murmur, click, rub or gallop GI: soft, non-tender; bowel sounds normal; no masses,  no organomegaly Extremities: extremities normal, atraumatic, no cyanosis or edema  ECOG PERFORMANCE STATUS: 1 - Symptomatic but completely ambulatory  Blood pressure 137/61, pulse 85, temperature 97.6 F (36.4 C), temperature source Oral, resp. rate 18, height 5\' 3"  (1.6 m), weight 192 lb 9.6 oz (87.363 kg), SpO2 97.00%.  LABORATORY DATA: Lab Results  Component Value Date   WBC 10.1 12/14/2013   HGB 16.8* 12/14/2013   HCT 50.3* 12/14/2013   MCV 94.5 12/14/2013   PLT 182 12/14/2013      Chemistry      Component Value Date/Time   NA 141 12/14/2013 0812   NA 139 07/12/2013 2120   NA 143 12/24/2011 1311   K 5.0 12/14/2013 0812   K 3.6* 07/12/2013 2120   K 4.7 12/24/2011 1311   CL 103 07/12/2013 2120   CL 106 10/06/2012 1303   CL 99 12/24/2011 1311   CO2 27 12/14/2013 0812   CO2 23 07/12/2013 2120   CO2 31 12/24/2011 1311   BUN 17.7 12/14/2013 0812   BUN 9 07/12/2013 2120   BUN 13 12/24/2011 1311   CREATININE 0.9 12/14/2013 0812   CREATININE 0.65 07/12/2013 2120   CREATININE 1.2 12/24/2011 1311      Component Value Date/Time   CALCIUM 10.0 12/14/2013 0812   CALCIUM 8.8 07/12/2013 2120   CALCIUM 10.2 12/24/2011 1311   ALKPHOS 103 12/14/2013 0812   ALKPHOS 93 07/12/2013 2120   ALKPHOS 98* 12/24/2011 1311   AST 17 12/14/2013 0812   AST 14 07/12/2013 2120   AST 18 12/24/2011 1311   ALT 16 12/14/2013 0812   ALT 14 07/12/2013 2120   ALT 18 12/24/2011  1311   BILITOT 0.32 12/14/2013 0812   BILITOT 0.2* 07/12/2013 2120   BILITOT 0.50 12/24/2011 1311       RADIOGRAPHIC STUDIES: Dg Ribs Unilateral W/chest Left  11/26/2013   CLINICAL DATA:  Left rib and chest pain.  Cough.  Lung carcinoma.  EXAM: LEFT RIBS AND CHEST - 3+ VIEW  COMPARISON:  09/06/2011 and more recent chest CT on 08/15/2013  FINDINGS: No fracture or other bone lesions seen involving the left ribs. No evidence of pneumothorax hemothorax.  Heart size remains within normal limits. Post treatment changes in the left hemithorax again demonstrated. Right basilar scarring also noted. No evidence of acute infiltrate or pleural effusion. Right-sided Port-A-Cath remains in appropriate position.  IMPRESSION: No acute findings.  Stable post treatment changes in left hemithorax and right basilar scarring.   Electronically Signed   By: Earle Gell M.D.   On: 11/26/2013 19:01   Ct Chest W Contrast  12/14/2013   CLINICAL DATA:  Lung cancer diagnosed in 2012 with brain metastasis. Chemotherapy and radiation therapy complete. Restaging.  EXAM: CT CHEST, ABDOMEN, AND PELVIS WITH CONTRAST  TECHNIQUE: Multidetector CT imaging of the chest, abdomen and pelvis was performed following the standard protocol during bolus administration of intravenous contrast.  CONTRAST:  13mL OMNIPAQUE IOHEXOL 300 MG/ML  SOLN  COMPARISON:  08/15/2013  FINDINGS:   CT CHEST FINDINGS  Lungs/Pleura: Severe centrilobular emphysema. Azygos fissure, a normal anatomic variant.  4 mm posterior right upper lobe lung nodule on image 11 which is unchanged.  Similar appearance of left perihilar and right infrahilar architectural distortion and traction bronchiectasis, likely related to radiation.  No pleural fluid.  Heart/Mediastinum: No supraclavicular adenopathy. Right-sided Port-A-Cath which terminates at the low SVC.  Bovine arch.  Dense aortic and branch vessel atherosclerosis.  Normal heart size. Moderate pericardial effusion is similar. No  central pulmonary embolism, on this non-dedicated study.  Multivessel coronary artery atherosclerosis. Similar middle mediastinal nodes. A low right paratracheal node measures 1.1 cm on image 20 and is unchanged. 1.2 cm subcarinal node is also similar.  Right suprahilar node which measures 1.2 cm on image 20 is unchanged.    CT ABDOMEN AND PELVIS FINDINGS  Abdomen/Pelvis: Normal liver. Atypical appearance of the spleen with irregular capsule and capsular calcifications. Possibly related to remote trauma. This is unchanged. Normal stomach, pancreas. Cholecystectomy, without biliary ductal dilatation. Normal adrenal glands and kidneys.  Infrarenal abdominal aortic aneurysm with extensive wall thrombus. This is unchanged at 4.9 cm. No surrounding hemorrhage. No retroperitoneal or retrocrural adenopathy. Normal colon, appendix, and terminal ileum. Normal small bowel without abdominal ascites. Right greater the left fat containing inguinal hernias. No pelvic adenopathy. Hysterectomy. Normal urinary bladder. No adnexal mass or significant free fluid.  Bones/Musculoskeletal: Compression deformity at L2 with mild ventral canal encroachment. This is unchanged. No suspicious focal osseous lesion.   IMPRESSION: 1. Similar radiation fibrosis within the lungs, without evidence of recurrent or metastatic disease. 2. Centrilobular emphysema with a similar 4 mm posterior right upper lobe lung nodule. 3. Similar mildly enlarged thoracic nodes, favor a benign etiology. 4. No acute process or evidence of metastatic disease in the abdomen or pelvis. 5. Similar infrarenal abdominal aortic aneurysm. 6. Similar L2 compression deformity. 7. Similar moderate pericardial effusion. 8. Age advanced coronary artery atherosclerosis. Recommend assessment of coronary risk factors and consideration of medical therapy.   Electronically Signed   By: Abigail Miyamoto M.D.   On: 12/14/2013 11:05    ASSESSMENT AND PLAN: This is a very pleasant 63  years old white female with metastatic non-small cell lung cancer, adenocarcinoma status post stereotactic radiotherapy to brain lesion as well as concurrent chemoradiation and consolidation systemic chemotherapy with carboplatin and Alimta status post 3 cycles. The patient is doing fine and the recent scan showed no evidence for disease progression. I discussed the scan results with the patient today. I recommended for her to continue on observation with repeat CT scan of the chest, abdomen and pelvis in 6 months. She was advised to call immediately if she has any concerning symptoms in the interval. The patient voices understanding of current disease status and treatment options and is in agreement with the current care plan. I strongly encouraged her to quit smoking. All questions  were answered. The patient knows to call the clinic with any problems, questions or concerns. We can certainly see the patient much sooner if necessary.  Disclaimer: This note was dictated with voice recognition software. Similar sounding words can inadvertently be transcribed and may not be corrected upon review.

## 2013-12-18 ENCOUNTER — Encounter: Payer: Self-pay | Admitting: Internal Medicine

## 2014-01-17 ENCOUNTER — Encounter (INDEPENDENT_AMBULATORY_CARE_PROVIDER_SITE_OTHER): Payer: Self-pay

## 2014-01-17 ENCOUNTER — Ambulatory Visit
Admission: RE | Admit: 2014-01-17 | Discharge: 2014-01-17 | Disposition: A | Discharge status: Home / Self Care | Payer: Medicare HMO | Source: Ambulatory Visit | Attending: Radiation Oncology | Admitting: Radiation Oncology

## 2014-01-17 DIAGNOSIS — C7931 Secondary malignant neoplasm of brain: Secondary | ICD-10-CM

## 2014-01-17 DIAGNOSIS — C7949 Secondary malignant neoplasm of other parts of nervous system: Principal | ICD-10-CM

## 2014-01-17 MED ORDER — GADOBENATE DIMEGLUMINE 529 MG/ML IV SOLN
18.0000 mL | Freq: Once | INTRAVENOUS | Status: AC | PRN
  Administered 2014-01-17: 18 mL via INTRAVENOUS

## 2014-01-18 ENCOUNTER — Ambulatory Visit
Admission: RE | Admit: 2014-01-18 | Discharge: 2014-01-18 | Disposition: A | Discharge status: Home / Self Care | Payer: Medicare Other | Source: Ambulatory Visit | Attending: Radiation Oncology | Admitting: Radiation Oncology

## 2014-01-18 ENCOUNTER — Encounter: Payer: Self-pay | Admitting: Radiation Oncology

## 2014-01-18 VITALS — BP 103/67 | HR 95 | Resp 18 | Wt 191.2 lb

## 2014-01-18 DIAGNOSIS — C7931 Secondary malignant neoplasm of brain: Secondary | ICD-10-CM

## 2014-01-18 DIAGNOSIS — C7949 Secondary malignant neoplasm of other parts of nervous system: Principal | ICD-10-CM

## 2014-01-18 MED ORDER — ESZOPICLONE 2 MG PO TABS: 2.0000 mg | ORAL_TABLET | Freq: Every evening | ORAL | Status: DC | PRN

## 2014-01-18 NOTE — Progress Notes (Signed)
Weight and vitals stable. Denies headache, dizziness, nausea or vomiting. Denies diplopia or ringing in the ears. Steady gait noted. Denies seizures, falls or confusion. Denies shortness of breath. Reports occasional dry cough with stress incontinence. Reports low back pain that radiates around to her left flank. She describes this pain as sharp for which motrin resolved.

## 2014-01-18 NOTE — Progress Notes (Signed)
Radiation Oncology         [phone]) [phone] ________________________________  Name: Courtney West MRN: [id number]  Date: 01/18/2014  DOB: 07-Jan-1951  Multidisciplinary Neuro Oncology Clinic Follow-Up Visit Note  CC: Shirline Frees, MD  Shirline Frees, MD  Diagnosis:   63 year old woman with metastatic lung cancer status post:  1. SRS to a 10-mm left frontal and 5-mm right frontal brain metastasis to 20 Gy on January 16, 2011.  2. The patient's primary lung cancer was treated to 66 Gy in 33 fractions of 2 Gy through March 10, 2011 . 3. SRS to 6 new brain metastases (2 adjacent left temporal brain metastases, a 4.7-mm vermis target, a 3.4-mm right frontal brain metastasis, a left parietal 5.3-mm brain metastasis, and a 6.6-mm left cerebellar brain metastasis) received 20 Gy in a single fraction.April 18, 2011. 4. SRS to a 4 mm posterior frontal target to 20 Gy on 10/20/2011 5. SBRT to a 1.6 cm metachronous clinical stage IA primary cancer of the right lower lung on 01/19/12, 01/21/12, and 01/26/12 to 54 gray in 3 fractions of 18 gray   Interval Since Last Radiation:  23  months  Narrative:  The patient returns today for routine follow-up.  The recent films were presented in our multidisciplinary conference with neuroradiology just prior to the clinic.  She is without complaint and recently returns from a road trip to [place].                              ALLERGIES:  is allergic to carboplatin; codeine; sulfa antibiotics; dilaudid; and penicillins.  Meds: Current Outpatient Prescriptions  Medication Sig Dispense Refill  . gabapentin (NEURONTIN) 300 MG capsule Take 2 capsules (600 mg total) by mouth 3 (three) times daily.  180 capsule  5  . albuterol (PROVENTIL HFA;VENTOLIN HFA) 108 (90 BASE) MCG/ACT inhaler Inhale 2 puffs into the lungs every 4 (four) hours as needed for wheezing or shortness of breath.  6.7 g  0  . amitriptyline (ELAVIL) 100 MG tablet Take 200 mg by mouth at bedtime.         Marland Kitchen azithromycin (ZITHROMAX) 250 MG tablet Take 2 po the first day then once a day for the next 4 days.  6 tablet  0  . budesonide-formoterol (SYMBICORT) 80-4.5 MCG/ACT inhaler Inhale 2 puffs into the lungs as needed.  1 Inhaler  5  . cyclobenzaprine (FLEXERIL) 10 MG tablet Take 1 tablet (10 mg total) by mouth 3 (three) times daily as needed for muscle spasms.  30 tablet  0  . dextromethorphan-guaiFENesin (MUCINEX DM) 30-600 MG per 12 hr tablet Take 1 tablet by mouth 2 (two) times daily as needed for cough.  20 tablet  0  . eszopiclone (LUNESTA) 2 MG TABS tablet Take 1 tablet (2 mg total) by mouth at bedtime as needed for sleep. Take immediately before bedtime  30 tablet  5  . HYDROcodone-acetaminophen (NORCO/VICODIN) 5-325 MG per tablet Take 1-2 tablets by mouth every 6 (six) hours as needed for moderate pain.  90 tablet  0  . ibuprofen (ADVIL,MOTRIN) 200 MG tablet Take 600 mg by mouth every 6 (six) hours as needed for headache.      . naproxen (NAPROSYN) 500 MG tablet Take 1 tablet (500 mg total) by mouth 2 (two) times daily with a meal.  30 tablet  0   No current facility-administered medications for this encounter.    Physical Findings:  The patient is in no acute distress. Patient is alert and oriented.  weight is 191 lb 3.2 oz (86.728 kg). Her blood pressure is 103/67 and her pulse is 95. Her respiration is 18 and oxygen saturation is 94%. .  No significant changes.  Lab Findings: Lab Results  Component Value Date   WBC 10.1 12/14/2013   HGB 16.8* 12/14/2013   HCT 50.3* 12/14/2013   MCV 94.5 12/14/2013   PLT 182 12/14/2013    @LASTCHEM @  Radiographic Findings: Mr Jeri Cos ZO Contrast  01/17/2014   CLINICAL DATA:  Metastatic lung cancer. Thirty-six months stereotactic radiosurgery restaging  EXAM: MRI HEAD WITHOUT AND WITH CONTRAST  TECHNIQUE: Multiplanar, multiecho pulse sequences of the brain and surrounding structures were obtained without and with intravenous contrast.  CONTRAST:   68mL MULTIHANCE GADOBENATE DIMEGLUMINE 529 MG/ML IV SOLN  COMPARISON:  MRI 09/02/2013, 05/27/2013  FINDINGS: 5 enhancing metastatic deposits are again noted. No new lesions are identified.  Left cerebellar ring-enhancing lesion measures 5.6 mm and is unchanged.  Left lateral temporal lobe lesion appears slightly larger on axial images but unchanged on coronal and sagittal images. This may be due to volume averaging. This lesion measures 4 x 5 mm.  Right frontal lesion is slightly smaller now measuring 4.5 x 6.5 mm.  Right posterior frontal lesion measures 2 x 3 mm and is unchanged.  Left frontal parietal white matter lesion is smaller now measuring 3 mm.  Negative for acute infarct.  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  IMPRESSION: Five metastatic deposits are again noted. The lesions are stable to slightly improved as above. No new lesions.   Electronically Signed   By: Franchot Gallo M.D.   On: 01/17/2014 16:17    Impression:  The patient is recovering from the effects of radiation.  She has no active brain metastases at present.  Plan:  MRI in 4 months, then follow-up  _____________________________________  Sheral Apley. Tammi Klippel, M.D.

## 2014-01-19 ENCOUNTER — Telehealth: Payer: Self-pay | Admitting: Radiation Oncology

## 2014-01-19 NOTE — Telephone Encounter (Signed)
Per Dr. Johny Shears order phoned in [place]. Spoke with Josephina Gip, pharmacist. Left message on patient's answering machine that script will be ready for pick up at her pharmacy within the hour.

## 2014-04-19 ENCOUNTER — Other Ambulatory Visit: Payer: Self-pay | Admitting: Nurse Practitioner

## 2014-04-27 ENCOUNTER — Other Ambulatory Visit: Payer: Self-pay | Admitting: Radiation Therapy

## 2014-04-27 DIAGNOSIS — C7931 Secondary malignant neoplasm of brain: Secondary | ICD-10-CM

## 2014-05-05 ENCOUNTER — Telehealth: Payer: Self-pay | Admitting: Radiation Oncology

## 2014-05-05 NOTE — Telephone Encounter (Signed)
-----   Message from Lora Paula, MD sent at 05/04/2014  9:51 AM EST ----- Regarding: RE: Medication refill request Please phone in refill, but, no dose increase at this time.   ----- Message -----    From: Heywood Footman, RN    Sent: 04/28/2014  11:48 AM      To: Lora Paula, MD, [facility] Subject: Medication refill request                      Dr. Tammi Klippel.  Manuela Schwartz called Ashwini this morning to touch base with her about upcoming appointments. During the conversation Satin insisted Manuela Schwartz let you know she will be out of her gabapentin come November 23rd. On 10/13/2013 you wrote for [facility] to have 600 mg tid. Shalan is requesting a higher dose of neurontin. She explained to Manuela Schwartz that in addition to the neurontin she takes 4 ibuprofen each time.   [name], RN

## 2014-05-05 NOTE — Telephone Encounter (Signed)
Per Dr. Johny Shears order called in gabapentin 600 mg tid with five refills to Richardson Landry at [facility] in [place]. Then, phoned patient making her aware this was done.

## 2014-06-03 ENCOUNTER — Encounter (HOSPITAL_COMMUNITY): Payer: Self-pay

## 2014-06-03 ENCOUNTER — Emergency Department (HOSPITAL_COMMUNITY)
Admission: EM | Admit: 2014-06-03 | Discharge: 2014-06-04 | Disposition: A | Discharge status: Home / Self Care | Payer: Medicare HMO | Attending: Emergency Medicine | Admitting: Emergency Medicine

## 2014-06-03 DIAGNOSIS — Z7951 Long term (current) use of inhaled steroids: Secondary | ICD-10-CM | POA: Insufficient documentation

## 2014-06-03 DIAGNOSIS — Z720 Tobacco use: Secondary | ICD-10-CM | POA: Diagnosis not present

## 2014-06-03 DIAGNOSIS — J440 Chronic obstructive pulmonary disease with acute lower respiratory infection: Secondary | ICD-10-CM | POA: Diagnosis not present

## 2014-06-03 DIAGNOSIS — Z8679 Personal history of other diseases of the circulatory system: Secondary | ICD-10-CM | POA: Diagnosis not present

## 2014-06-03 DIAGNOSIS — Z880 Allergy status to penicillin: Secondary | ICD-10-CM | POA: Insufficient documentation

## 2014-06-03 DIAGNOSIS — J209 Acute bronchitis, unspecified: Secondary | ICD-10-CM | POA: Insufficient documentation

## 2014-06-03 DIAGNOSIS — R05 Cough: Secondary | ICD-10-CM | POA: Diagnosis present

## 2014-06-03 DIAGNOSIS — I10 Essential (primary) hypertension: Secondary | ICD-10-CM | POA: Diagnosis not present

## 2014-06-03 DIAGNOSIS — J449 Chronic obstructive pulmonary disease, unspecified: Secondary | ICD-10-CM

## 2014-06-03 DIAGNOSIS — Z79899 Other long term (current) drug therapy: Secondary | ICD-10-CM | POA: Diagnosis not present

## 2014-06-03 DIAGNOSIS — Z8673 Personal history of transient ischemic attack (TIA), and cerebral infarction without residual deficits: Secondary | ICD-10-CM | POA: Diagnosis not present

## 2014-06-03 DIAGNOSIS — Z85841 Personal history of malignant neoplasm of brain: Secondary | ICD-10-CM | POA: Diagnosis not present

## 2014-06-03 DIAGNOSIS — Z923 Personal history of irradiation: Secondary | ICD-10-CM | POA: Insufficient documentation

## 2014-06-03 DIAGNOSIS — Z85118 Personal history of other malignant neoplasm of bronchus and lung: Secondary | ICD-10-CM | POA: Insufficient documentation

## 2014-06-03 DIAGNOSIS — Z791 Long term (current) use of non-steroidal anti-inflammatories (NSAID): Secondary | ICD-10-CM | POA: Diagnosis not present

## 2014-06-03 NOTE — ED Notes (Signed)
Patient complaining of hurting all over, head congestion, trouble breathing through her nose. Patient states that she has been coughing.

## 2014-06-04 ENCOUNTER — Emergency Department (HOSPITAL_COMMUNITY): Payer: Medicare HMO

## 2014-06-04 DIAGNOSIS — J209 Acute bronchitis, unspecified: Secondary | ICD-10-CM | POA: Diagnosis not present

## 2014-06-04 MED ORDER — BENZONATATE 100 MG PO CAPS: 100.0000 mg | ORAL_CAPSULE | Freq: Three times a day (TID) | ORAL | Status: DC

## 2014-06-04 MED ORDER — PREDNISONE 10 MG PO TABS: 20.0000 mg | ORAL_TABLET | Freq: Two times a day (BID) | ORAL | Status: DC

## 2014-06-04 MED ORDER — AZITHROMYCIN 250 MG PO TABS: ORAL_TABLET | ORAL | Status: DC

## 2014-06-04 MED ORDER — ALBUTEROL SULFATE (2.5 MG/3ML) 0.083% IN NEBU
5.0000 mg | INHALATION_SOLUTION | Freq: Once | RESPIRATORY_TRACT | Status: AC
  Administered 2014-06-04: 5 mg via RESPIRATORY_TRACT
  Filled 2014-06-04: qty 6

## 2014-06-04 MED ORDER — IPRATROPIUM BROMIDE 0.02 % IN SOLN
0.5000 mg | Freq: Once | RESPIRATORY_TRACT | Status: AC
Start: 2014-06-04 — End: 2014-06-04
  Administered 2014-06-04: 0.5 mg via RESPIRATORY_TRACT
  Filled 2014-06-04: qty 2.5

## 2014-06-04 MED ORDER — ALBUTEROL SULFATE HFA 108 (90 BASE) MCG/ACT IN AERS
2.0000 | INHALATION_SPRAY | RESPIRATORY_TRACT | Status: DC | PRN
  Administered 2014-06-04: 2 via RESPIRATORY_TRACT
  Filled 2014-06-04: qty 6.7

## 2014-06-04 NOTE — Discharge Instructions (Signed)
Zithromax and prednisone as prescribed.  Albuterol inhaler: 2 puffs every 4 hours as needed for wheezing.   Acute Bronchitis Bronchitis is inflammation of the airways that extend from the windpipe into the lungs (bronchi). The inflammation often causes mucus to develop. This leads to a cough, which is the most common symptom of bronchitis.  In acute bronchitis, the condition usually develops suddenly and goes away over time, usually in a couple weeks. Smoking, allergies, and asthma can make bronchitis worse. Repeated episodes of bronchitis may cause further lung problems.  CAUSES Acute bronchitis is most often caused by the same virus that causes a cold. The virus can spread from person to person (contagious) through coughing, sneezing, and touching contaminated objects. SIGNS AND SYMPTOMS   Cough.   Fever.   Coughing up mucus.   Body aches.   Chest congestion.   Chills.   Shortness of breath.   Sore throat.  DIAGNOSIS  Acute bronchitis is usually diagnosed through a physical exam. Your health care provider will also ask you questions about your medical history. Tests, such as chest X-rays, are sometimes done to rule out other conditions.  TREATMENT  Acute bronchitis usually goes away in a couple weeks. Oftentimes, no medical treatment is necessary. Medicines are sometimes given for relief of fever or cough. Antibiotic medicines are usually not needed but may be prescribed in certain situations. In some cases, an inhaler may be recommended to help reduce shortness of breath and control the cough. A cool mist vaporizer may also be used to help thin bronchial secretions and make it easier to clear the chest.  HOME CARE INSTRUCTIONS  Get plenty of rest.   Drink enough fluids to keep your urine clear or pale yellow (unless you have a medical condition that requires fluid restriction). Increasing fluids may help thin your respiratory secretions (sputum) and reduce chest  congestion, and it will prevent dehydration.   Take medicines only as directed by your health care provider.  If you were prescribed an antibiotic medicine, finish it all even if you start to feel better.  Avoid smoking and secondhand smoke. Exposure to cigarette smoke or irritating chemicals will make bronchitis worse. If you are a smoker, consider using nicotine gum or skin patches to help control withdrawal symptoms. Quitting smoking will help your lungs heal faster.   Reduce the chances of another bout of acute bronchitis by washing your hands frequently, avoiding people with cold symptoms, and trying not to touch your hands to your mouth, nose, or eyes.   Keep all follow-up visits as directed by your health care provider.  SEEK MEDICAL CARE IF: Your symptoms do not improve after 1 week of treatment.  SEEK IMMEDIATE MEDICAL CARE IF:  You develop an increased fever or chills.   You have chest pain.   You have severe shortness of breath.  You have bloody sputum.   You develop dehydration.  You faint or repeatedly feel like you are going to pass out.  You develop repeated vomiting.  You develop a severe headache. MAKE SURE YOU:   Understand these instructions.  Will watch your condition.  Will get help right away if you are not doing well or get worse. Document Released: 07/17/2004 Document Revised: 10/24/2013 Document Reviewed: 11/30/2012 [facility] Patient Information 2015 [place], [place]. This information is not intended to replace advice given to you by your health care provider. Make sure you discuss any questions you have with your health care provider.

## 2014-06-04 NOTE — ED Notes (Signed)
Pt alert & oriented x4, stable gait. Patient given discharge instructions, paperwork & prescription(s). Patient  instructed to stop at the registration desk to finish any additional paperwork. Patient verbalized understanding. Pt left department w/ no further questions. 

## 2014-06-04 NOTE — ED Notes (Signed)
Pt states she has a dry cough & nasal congestion. Pt states she just feels generally bad all over.

## 2014-06-04 NOTE — ED Provider Notes (Signed)
CSN: [id number]     Arrival date & time 06/03/14  2210 History  This chart was scribed for Veryl Speak, MD by Jeanell Sparrow, ED Scribe. This patient was seen in room APA03/APA03 and the patient's care was started at 12:12 AM.   Chief Complaint  Patient presents with  . Generalized Body Aches   Patient is a 63 y.o. female presenting with general illness. The history is provided by the patient. No language interpreter was used.  Illness Location:  Generalized Severity:  Moderate Onset quality:  Gradual Timing:  Constant Progression:  Unchanged Chronicity:  New Relieved by:  None Worsened by:  None Ineffective treatments:  None Associated symptoms: congestion, cough, myalgias (generalized) and sore throat   Risk factors:  Hx of smoking   HPI Comments: Blakeleigh P Poke is a 63 y.o. female who presents to the Emergency Department complaining of constant moderate generalized myalgias. She states that she has associated congestion, nonproductive cough, sore throat. She reports no modifying factors. She denies any sick contacts. She reports a hx of smoking, COPD, and stage IV lung cancer   Past Medical History  Diagnosis Date  . Hypertension   . COPD (chronic obstructive pulmonary disease)   . Stroke   . Abdominal aortic aneurysm   . Dyslipidemia   . S/P radiation therapy 7/12 thru 9/12, 04/18/11    xrt to brain mets  . Lung cancer 12/2010  . Brain cancer     mets from lung primary  . Allergy     codeine,pcn,sulfa drugs  . Radiation 10/20/2011    frontal mets/Palliation  . History of radiation therapy 01/19/12, 01/21/12, 01/26/12    RLL lung  . History of radiation therapy 04/18/11    single fraction to 6 brain mets  . Clotted vascular catheter 03/30/2012  . Lung cancer 12/22/2010   Past Surgical History  Procedure Laterality Date  . Carpal tunnel release    . Left knee surgery    . Cholecystectomy    . Partial hysterectomy     Family History  Problem Relation Age of Onset  .  Cancer Father     lung, leukemia  . Cancer Maternal Aunt     kidney   History  Substance Use Topics  . Smoking status: Current Every Day Smoker -- 1.00 packs/day for 50 years    Types: Cigarettes  . Smokeless tobacco: Not on file  . Alcohol Use: No   OB History    No data available     Review of Systems  HENT: Positive for congestion and sore throat.   Respiratory: Positive for cough.   Musculoskeletal: Positive for myalgias (generalized).  All other systems reviewed and are negative.   Allergies  Carboplatin; Codeine; Sulfa antibiotics; Dilaudid; and Penicillins  Home Medications   Prior to Admission medications   Medication Sig Start Date End Date Taking? Authorizing Provider  albuterol (PROVENTIL HFA;VENTOLIN HFA) 108 (90 BASE) MCG/ACT inhaler Inhale 2 puffs into the lungs every 4 (four) hours as needed for wheezing or shortness of breath. 11/26/13   Janice Norrie, MD  amitriptyline (ELAVIL) 100 MG tablet Take 200 mg by mouth at bedtime.      Historical Provider, MD  azithromycin (ZITHROMAX) 250 MG tablet Take 2 po the first day then once a day for the next 4 days. 11/26/13   Janice Norrie, MD  budesonide-formoterol (SYMBICORT) 80-4.5 MCG/ACT inhaler Inhale 2 puffs into the lungs as needed. 05/30/13   Lora Paula, MD  cyclobenzaprine (FLEXERIL) 10 MG tablet Take 1 tablet (10 mg total) by mouth 3 (three) times daily as needed for muscle spasms. 11/26/13   Janice Norrie, MD  dextromethorphan-guaiFENesin (MUCINEX DM) 30-600 MG per 12 hr tablet Take 1 tablet by mouth 2 (two) times daily as needed for cough. 11/26/13   Janice Norrie, MD  eszopiclone (LUNESTA) 2 MG TABS tablet Take 1 tablet (2 mg total) by mouth at bedtime as needed for sleep. Take immediately before bedtime 01/18/14   Lora Paula, MD  gabapentin (NEURONTIN) 300 MG capsule Take 2 capsules (600 mg total) by mouth 3 (three) times daily. 10/13/13   Lora Paula, MD  HYDROcodone-acetaminophen (NORCO/VICODIN) 5-325 MG per  tablet Take 1-2 tablets by mouth every 6 (six) hours as needed for moderate pain. 11/08/13   Lora Paula, MD  ibuprofen (ADVIL,MOTRIN) 200 MG tablet Take 600 mg by mouth every 6 (six) hours as needed for headache.    Historical Provider, MD  naproxen (NAPROSYN) 500 MG tablet Take 1 tablet (500 mg total) by mouth 2 (two) times daily with a meal. 11/26/13   Janice Norrie, MD   BP 135/62 mmHg  Pulse 84  Temp(Src) 98.2 F (36.8 C) (Oral)  Resp 18  Ht 5\' 3"  (1.6 m)  Wt 199 lb (90.266 kg)  BMI 35.26 kg/m2  SpO2 95% Physical Exam  Constitutional: She is oriented to person, place, and time. She appears well-developed and well-nourished.  HENT:  Head: Normocephalic and atraumatic.  Right Ear: Tympanic membrane normal.  Left Ear: Tympanic membrane normal.  Eyes: Conjunctivae are normal. Pupils are equal, round, and reactive to light.  Neck: Neck supple. No tracheal deviation present.  Cardiovascular: Normal rate, regular rhythm and normal heart sounds.  Exam reveals no gallop and no friction rub.   No murmur heard. Pulmonary/Chest: Effort normal. No respiratory distress. She has wheezes. She has no rales.  Slight expiraory wheezing bilaterally.   Abdominal: Soft. She exhibits no distension. There is no tenderness. There is no rebound and no guarding.  Musculoskeletal: Normal range of motion.  Neurological: She is alert and oriented to person, place, and time.  Skin: Skin is warm and dry.  Psychiatric: She has a normal mood and affect. Her behavior is normal.  Nursing note and vitals reviewed.   ED Course  Procedures (including critical care time) DIAGNOSTIC STUDIES: Oxygen Saturation is 95% on RA, normal by my interpretation.    COORDINATION OF CARE: 12:16 AM- Pt advised of plan for treatment which includes medication and radiology and pt agrees.  Labs Review Labs Reviewed - No data to display  Imaging Review No results found.   EKG Interpretation None      MDM   Final  diagnoses:  None    Chest x-ray reveals no acute process. Her symptoms sounds like a developing bronchitis. She has a history of COPD and will be treated with Zithromax, prednisone, and albuterol MDI, and when necessary return.   I personally performed the services described in this documentation, which was scribed in my presence. The recorded information has been reviewed and is accurate.      Veryl Speak, MD 06/04/14 [phone]

## 2014-06-05 ENCOUNTER — Ambulatory Visit: Payer: Medicare HMO | Admitting: Radiation Oncology

## 2014-06-14 ENCOUNTER — Ambulatory Visit (HOSPITAL_BASED_OUTPATIENT_CLINIC_OR_DEPARTMENT_OTHER): Payer: Medicare HMO | Admitting: Lab

## 2014-06-14 ENCOUNTER — Ambulatory Visit (HOSPITAL_COMMUNITY)
Admission: RE | Admit: 2014-06-14 | Discharge: 2014-06-14 | Disposition: A | Discharge status: Home / Self Care | Payer: Medicare HMO | Source: Ambulatory Visit | Attending: Internal Medicine | Admitting: Internal Medicine

## 2014-06-14 DIAGNOSIS — C3490 Malignant neoplasm of unspecified part of unspecified bronchus or lung: Secondary | ICD-10-CM | POA: Insufficient documentation

## 2014-06-14 DIAGNOSIS — Z9221 Personal history of antineoplastic chemotherapy: Secondary | ICD-10-CM | POA: Insufficient documentation

## 2014-06-14 DIAGNOSIS — R0602 Shortness of breath: Secondary | ICD-10-CM | POA: Insufficient documentation

## 2014-06-14 DIAGNOSIS — Z85118 Personal history of other malignant neoplasm of bronchus and lung: Secondary | ICD-10-CM

## 2014-06-14 DIAGNOSIS — Z923 Personal history of irradiation: Secondary | ICD-10-CM | POA: Insufficient documentation

## 2014-06-14 LAB — CBC WITH DIFFERENTIAL/PLATELET
BASO%: 0.5 % (ref 0.0–2.0)
Basophils Absolute: 0.1 10*3/uL (ref 0.0–0.1)
EOS ABS: 0.3 10*3/uL (ref 0.0–0.5)
EOS%: 1.8 % (ref 0.0–7.0)
HEMATOCRIT: 48.9 % — AB (ref 34.8–46.6)
HGB: 16.8 g/dL — ABNORMAL HIGH (ref 11.6–15.9)
LYMPH#: 4 10*3/uL — AB (ref 0.9–3.3)
LYMPH%: 26.5 % (ref 14.0–49.7)
MCH: 32.2 pg (ref 25.1–34.0)
MCHC: 34.4 g/dL (ref 31.5–36.0)
MCV: 93.7 fL (ref 79.5–101.0)
MONO#: 1.1 10*3/uL — ABNORMAL HIGH (ref 0.1–0.9)
MONO%: 6.9 % (ref 0.0–14.0)
NEUT%: 64.3 % (ref 38.4–76.8)
NEUTROS ABS: 9.8 10*3/uL — AB (ref 1.5–6.5)
Platelets: 197 10*3/uL (ref 145–400)
RBC: 5.22 10*6/uL (ref 3.70–5.45)
RDW: 12.9 % (ref 11.2–14.5)
WBC: 15.2 10*3/uL — AB (ref 3.9–10.3)

## 2014-06-14 LAB — COMPREHENSIVE METABOLIC PANEL (CC13)
ALK PHOS: 101 U/L (ref 40–150)
ALT: 19 U/L (ref 0–55)
AST: 13 U/L (ref 5–34)
Albumin: 3.7 g/dL (ref 3.5–5.0)
Anion Gap: 12 mEq/L — ABNORMAL HIGH (ref 3–11)
BUN: 23.9 mg/dL (ref 7.0–26.0)
CO2: 28 mEq/L (ref 22–29)
Calcium: 9.6 mg/dL (ref 8.4–10.4)
Chloride: 101 mEq/L (ref 98–109)
Creatinine: 1.1 mg/dL (ref 0.6–1.1)
EGFR: 52 mL/min/{1.73_m2} — ABNORMAL LOW (ref >90)
Glucose: 116 mg/dl (ref 70–140)
POTASSIUM: 4.6 meq/L (ref 3.5–5.1)
SODIUM: 141 meq/L (ref 136–145)
Total Bilirubin: 0.24 mg/dL (ref 0.20–1.20)
Total Protein: 7.4 g/dL (ref 6.4–8.3)

## 2014-06-14 MED ORDER — IOHEXOL 300 MG/ML  SOLN
100.0000 mL | Freq: Once | INTRAMUSCULAR | Status: AC | PRN
  Administered 2014-06-14: 100 mL via INTRAVENOUS

## 2014-06-21 ENCOUNTER — Encounter: Payer: Self-pay | Admitting: Internal Medicine

## 2014-06-21 ENCOUNTER — Telehealth: Payer: Self-pay | Admitting: Internal Medicine

## 2014-06-21 ENCOUNTER — Ambulatory Visit (HOSPITAL_BASED_OUTPATIENT_CLINIC_OR_DEPARTMENT_OTHER): Payer: Medicare HMO | Admitting: Internal Medicine

## 2014-06-21 VITALS — BP 111/49 | HR 87 | Temp 98.3°F | Resp 19 | Ht 63.0 in | Wt 201.7 lb

## 2014-06-21 DIAGNOSIS — C3491 Malignant neoplasm of unspecified part of right bronchus or lung: Secondary | ICD-10-CM

## 2014-06-21 DIAGNOSIS — C7989 Secondary malignant neoplasm of other specified sites: Secondary | ICD-10-CM

## 2014-06-21 DIAGNOSIS — C7931 Secondary malignant neoplasm of brain: Secondary | ICD-10-CM

## 2014-06-21 DIAGNOSIS — Z720 Tobacco use: Secondary | ICD-10-CM

## 2014-06-21 DIAGNOSIS — D72829 Elevated white blood cell count, unspecified: Secondary | ICD-10-CM

## 2014-06-21 DIAGNOSIS — C3412 Malignant neoplasm of upper lobe, left bronchus or lung: Secondary | ICD-10-CM

## 2014-06-21 NOTE — Telephone Encounter (Signed)
gv and printed appt sched and avs for pt for June 2016...gv pt barium

## 2014-06-21 NOTE — Patient Instructions (Signed)
Smoking Cessation, Tips for Success  If you are ready to quit smoking, congratulations! You have chosen to help yourself be healthier. Cigarettes bring nicotine, tar, carbon monoxide, and other irritants into your body. Your lungs, heart, and blood vessels will be able to work better without these poisons. There are many different ways to quit smoking. Nicotine gum, nicotine patches, a nicotine inhaler, or nicotine nasal spray can help with physical craving. Hypnosis, support groups, and medicines help break the habit of smoking.  WHAT THINGS CAN I DO TO MAKE QUITTING EASIER?   Here are some tips to help you quit for good:  · Pick a date when you will quit smoking completely. Tell all of your friends and family about your plan to quit on that date.  · Do not try to slowly cut down on the number of cigarettes you are smoking. Pick a quit date and quit smoking completely starting on that day.  · Throw away all cigarettes.    · Clean and remove all ashtrays from your home, work, and car.  · On a card, write down your reasons for quitting. Carry the card with you and read it when you get the urge to smoke.  · Cleanse your body of nicotine. Drink enough water and fluids to keep your urine clear or pale yellow. Do this after quitting to flush the nicotine from your body.  · Learn to predict your moods. Do not let a bad situation be your excuse to have a cigarette. Some situations in your life might tempt you into wanting a cigarette.  · Never have "just one" cigarette. It leads to wanting another and another. Remind yourself of your decision to quit.  · Change habits associated with smoking. If you smoked while driving or when feeling stressed, try other activities to replace smoking. Stand up when drinking your coffee. Brush your teeth after eating. Sit in a different chair when you read the paper. Avoid alcohol while trying to quit, and try to drink fewer caffeinated beverages. Alcohol and caffeine may urge you to  smoke.  · Avoid foods and drinks that can trigger a desire to smoke, such as sugary or spicy foods and alcohol.  · Ask people who smoke not to smoke around you.  · Have something planned to do right after eating or having a cup of coffee. For example, plan to take a walk or exercise.  · Try a relaxation exercise to calm you down and decrease your stress. Remember, you may be tense and nervous for the first 2 weeks after you quit, but this will pass.  · Find new activities to keep your hands busy. Play with a pen, coin, or rubber band. Doodle or draw things on paper.  · Brush your teeth right after eating. This will help cut down on the craving for the taste of tobacco after meals. You can also try mouthwash.    · Use oral substitutes in place of cigarettes. Try using lemon drops, carrots, cinnamon sticks, or chewing gum. Keep them handy so they are available when you have the urge to smoke.  · When you have the urge to smoke, try deep breathing.  · Designate your home as a nonsmoking area.  · If you are a heavy smoker, ask your health care provider about a prescription for nicotine chewing gum. It can ease your withdrawal from nicotine.  · Reward yourself. Set aside the cigarette money you save and buy yourself something nice.  · Look for   support from others. Join a support group or smoking cessation program. Ask someone at home or at work to help you with your plan to quit smoking.  · Always ask yourself, "Do I need this cigarette or is this just a reflex?" Tell yourself, "Today, I choose not to smoke," or "I do not want to smoke." You are reminding yourself of your decision to quit.  · Do not replace cigarette smoking with electronic cigarettes (commonly called e-cigarettes). The safety of e-cigarettes is unknown, and some may contain harmful chemicals.  · If you relapse, do not give up! Plan ahead and think about what you will do the next time you get the urge to smoke.  HOW WILL I FEEL WHEN I QUIT SMOKING?  You  may have symptoms of withdrawal because your body is used to nicotine (the addictive substance in cigarettes). You may crave cigarettes, be irritable, feel very hungry, cough often, get headaches, or have difficulty concentrating. The withdrawal symptoms are only temporary. They are strongest when you first quit but will go away within 10-14 days. When withdrawal symptoms occur, stay in control. Think about your reasons for quitting. Remind yourself that these are signs that your body is healing and getting used to being without cigarettes. Remember that withdrawal symptoms are easier to treat than the major diseases that smoking can cause.   Even after the withdrawal is over, expect periodic urges to smoke. However, these cravings are generally short lived and will go away whether you smoke or not. Do not smoke!  WHAT RESOURCES ARE AVAILABLE TO HELP ME QUIT SMOKING?  Your health care provider can direct you to community resources or hospitals for support, which may include:  · Group support.  · Education.  · Hypnosis.  · Therapy.  Document Released: 03/07/2004 Document Revised: 10/24/2013 Document Reviewed: 11/25/2012  [employer]® Patient Information ©2015 [employer]. This information is not intended to replace advice given to you by your health care provider. Make sure you discuss any questions you have with your health care provider.

## 2014-06-21 NOTE — Progress Notes (Signed)
[facility] [phone]   Fax:(336) [place], [place] [address] [place] [address]  DIAGNOSIS: Metastatic non-small cell lung cancer, favoring adenocarcinoma diagnosed in July of 2012, presented with locally advanced disease in the chest as well as brain metastasis. Veristrat test poor.   PRIOR THERAPY:  1. Status post stereotactic radiotherapy to 2 brain lesions under the care of Dr. Lisbeth Renshaw. 2. Status post a course of concurrent chemoradiation with weekly carboplatin and paclitaxel, last dose of chemotherapy was given 03/05/2011. 3. Systemic chemotherapy with carboplatin for AUC of 5 and Alimta 500 mg/M2. The patient is status post 3 cycles. Last dose was given 06/03/2011  CURRENT THERAPY: Observation.  INTERVAL HISTORY: Courtney West 63 y.o. female returns to the clinic today for routine four-month followup visit  she is recovering from a recent acute bronchitis. The patient was treated with Z-Pak and prednisone. The patient has no complaints today except for chest congestion. Unfortunately she continues to smoke and unwilling to quit smoking even after several counseling and recommendation to quit smoking. She denied having any significant chest pain, shortness of breath, cough or hemoptysis. She has no weight loss or night sweats. The patient had repeat CT scan of the chest, abdomen and pelvis performed recently and she is here for evaluation and discussion of her scan results.  MEDICAL HISTORY: Past Medical History  Diagnosis Date  . Hypertension   . COPD (chronic obstructive pulmonary disease)   . Stroke   . Abdominal aortic aneurysm   . Dyslipidemia   . S/P radiation therapy 7/12 thru 9/12, 04/18/11    xrt to brain mets  . Lung cancer 12/2010  . Brain cancer     mets from lung primary  . Allergy     codeine,pcn,sulfa drugs  . Radiation 10/20/2011    frontal mets/Palliation  . History of radiation  therapy 01/19/12, 01/21/12, 01/26/12    RLL lung  . History of radiation therapy 04/18/11    single fraction to 6 brain mets  . Clotted vascular catheter 03/30/2012  . Lung cancer 12/22/2010    ALLERGIES:  is allergic to carboplatin; codeine; sulfa antibiotics; dilaudid; and penicillins.  MEDICATIONS:  Current Outpatient Prescriptions  Medication Sig Dispense Refill  . albuterol (PROVENTIL HFA;VENTOLIN HFA) 108 (90 BASE) MCG/ACT inhaler Inhale 2 puffs into the lungs every 4 (four) hours as needed for wheezing or shortness of breath. 6.7 g 0  . benzonatate (TESSALON) 100 MG capsule Take 1 capsule (100 mg total) by mouth every 8 (eight) hours. 21 capsule 0  . budesonide-formoterol (SYMBICORT) 80-4.5 MCG/ACT inhaler Inhale 2 puffs into the lungs as needed. 1 Inhaler 5  . eszopiclone (LUNESTA) 2 MG TABS tablet Take 1 tablet (2 mg total) by mouth at bedtime as needed for sleep. Take immediately before bedtime 30 tablet 5  . gabapentin (NEURONTIN) 300 MG capsule Take 2 capsules (600 mg total) by mouth 3 (three) times daily. 180 capsule 5  . ibuprofen (ADVIL,MOTRIN) 200 MG tablet Take 600 mg by mouth every 6 (six) hours as needed for headache.     No current facility-administered medications for this visit.    SURGICAL HISTORY:  Past Surgical History  Procedure Laterality Date  . Carpal tunnel release    . Left knee surgery    . Cholecystectomy    . Partial hysterectomy      REVIEW OF SYSTEMS:  A comprehensive review of systems  was negative except for: Constitutional: positive for fatigue Respiratory: positive for dyspnea on exertion   PHYSICAL EXAMINATION: General appearance: alert, cooperative, fatigued and no distress Head: Normocephalic, without obvious abnormality, atraumatic Neck: no adenopathy, no JVD, supple, symmetrical, trachea midline and thyroid not enlarged, symmetric, no tenderness/mass/nodules Lymph nodes: Cervical, supraclavicular, and axillary nodes normal. Resp: wheezes  bilaterally Back: symmetric, no curvature. ROM normal. No CVA tenderness. Cardio: regular rate and rhythm, S1, S2 normal, no murmur, click, rub or gallop GI: soft, non-tender; bowel sounds normal; no masses,  no organomegaly Extremities: extremities normal, atraumatic, no cyanosis or edema  ECOG PERFORMANCE STATUS: 1 - Symptomatic but completely ambulatory  Blood pressure 111/49, pulse 87, temperature 98.3 F (36.8 C), temperature source Oral, resp. rate 19, height 5\' 3"  (1.6 m), weight 201 lb 11.2 oz (91.491 kg), SpO2 94 %.  LABORATORY DATA: Lab Results  Component Value Date   WBC 15.2* 06/14/2014   HGB 16.8* 06/14/2014   HCT 48.9* 06/14/2014   MCV 93.7 06/14/2014   PLT 197 06/14/2014      Chemistry      Component Value Date/Time   NA 141 06/14/2014 0808   NA 139 07/12/2013 2120   NA 143 12/24/2011 1311   K 4.6 06/14/2014 0808   K 3.6* 07/12/2013 2120   K 4.7 12/24/2011 1311   CL 103 07/12/2013 2120   CL 106 10/06/2012 1303   CL 99 12/24/2011 1311   CO2 28 06/14/2014 0808   CO2 23 07/12/2013 2120   CO2 31 12/24/2011 1311   BUN 23.9 06/14/2014 0808   BUN 9 07/12/2013 2120   BUN 13 12/24/2011 1311   CREATININE 1.1 06/14/2014 0808   CREATININE 0.65 07/12/2013 2120   CREATININE 1.2 12/24/2011 1311      Component Value Date/Time   CALCIUM 9.6 06/14/2014 0808   CALCIUM 8.8 07/12/2013 2120   CALCIUM 10.2 12/24/2011 1311   ALKPHOS 101 06/14/2014 0808   ALKPHOS 93 07/12/2013 2120   ALKPHOS 98* 12/24/2011 1311   AST 13 06/14/2014 0808   AST 14 07/12/2013 2120   AST 18 12/24/2011 1311   ALT 19 06/14/2014 0808   ALT 14 07/12/2013 2120   ALT 18 12/24/2011 1311   BILITOT 0.24 06/14/2014 0808   BILITOT 0.2* 07/12/2013 2120   BILITOT 0.50 12/24/2011 1311       RADIOGRAPHIC STUDIES: Dg Chest 2 View  06/04/2014   CLINICAL DATA:  Cough, congestion, and difficulty breathing. History of lung cancer with brain metastasis.  EXAM: CHEST  2 VIEW  COMPARISON:  11/26/2013   FINDINGS: Mild cardiac enlargement. Scarring in the mid lungs and left hilum consistent with prior radiation change. No change since prior study. Probable emphysematous changes in the upper lungs. Heart for type central venous catheter is unchanged. No pneumothorax. No blunting of costophrenic angles. No developing airspace disease. Calcification of the aorta. Compression of L2 is demonstrated similar to previous studies.  IMPRESSION: No evidence of active pulmonary disease. Unchanged scarring in the mid lungs and left hilum consistent with previous radiation changes.   Electronically Signed   By: Lucienne Capers M.D.   On: 06/04/2014 01:20   Ct Chest W Contrast  06/14/2014   CLINICAL DATA:  Lung cancer diagnosed 2012, chemotherapy/ XRT complete. Shortness of breath. Prior cholecystectomy.  EXAM: CT CHEST, ABDOMEN, AND PELVIS WITH CONTRAST  TECHNIQUE: Multidetector CT imaging of the chest, abdomen and pelvis was performed following the standard protocol during bolus administration of intravenous contrast.  CONTRAST:  140mL OMNIPAQUE IOHEXOL 300 MG/ML  SOLN  COMPARISON:  12/14/2013.  FINDINGS: CT CHEST FINDINGS  Radiation changes in the left lower lobe/paramediastinal region. Radiation changes in the central right lower lobe.  5 x 4 mm subpleural nodule in the posterior right upper lobe (series 5/image 15), unchanged. No new/suspicious pulmonary nodules.  Moderate centrilobular and paraseptal emphysematous changes, upper lobe predominant.  No pleural effusion or pneumothorax.  Visualized thyroid is unremarkable.  The heart is top-normal in size. Moderate pericardial effusion, unchanged. Coronary atherosclerosis in the left main coronary artery and LAD. Atherosclerotic calcifications of the aortic arch.  Small mediastinal lymph nodes measuring up to 12 mm short axis in the right paratracheal region (series 2/ image 23), previously 11 mm. 11 mm short axis right hilar node, previously 12 mm.  Mild degenerative  changes of the thoracic spine.  CT ABDOMEN AND PELVIS FINDINGS  Hepatobiliary: Liver is within normal limits. No suspicious/enhancing hepatic lesions.  Status post hysterectomy. No intrahepatic or extrahepatic ductal dilatation.  Pancreas: Pancreas is notable for a 9 mm nonaggressive cyst along the inferior aspect of the pancreatic body (series 2/ image 52), grossly unchanged. No associated dilatation of the main pancreatic duct.  Spleen: Calcifications along the lateral spleen (series 2/image 49), chronic.  Adrenals/Urinary Tract: Adrenal glands are unremarkable.  Kidneys are within normal limits.  No hydronephrosis.  Bladder is within normal limits.  Stomach/Bowel: Stomach is unremarkable.  No evidence of bowel obstruction.  Normal appendix.  Vascular/Lymphatic: 5.0 x 5.4 cm infrarenal abdominal aortic aneurysm (series 2/ image 71), unchanged. Atherosclerotic calcifications of the abdominal aorta and branch vessels.  No suspicious abdominopelvic lymphadenopathy.  1.5 x 2.3 cm fat density lesion in the sigmoid mesentery (series 2/ image 100), likely reflecting fat necrosis.  Reproductive: Status post hysterectomy.  No adnexal masses.  Other: No abdominopelvic ascites.  Small fat containing bilateral inguinal hernias.  Musculoskeletal: Degenerative changes of the lumbar spine.  Stable wedge deformity of the L2 vertebral body.  IMPRESSION: Stable radiation changes in the lungs bilaterally.  Stable 4 x 5 mm right upper lobe pulmonary nodule.  No evidence of recurrent or metastatic disease in the chest, abdomen, or pelvis.  Stable 5.0 x 5.4 cm infrarenal abdominal aortic aneurysm.  Additional stable ancillary findings as above.   Electronically Signed   By: Julian Hy M.D.   On: 06/14/2014 11:03   Ct Abdomen Pelvis W Contrast  06/14/2014   CLINICAL DATA:  Lung cancer diagnosed 2012, chemotherapy/ XRT complete. Shortness of breath. Prior cholecystectomy.  EXAM: CT CHEST, ABDOMEN, AND PELVIS WITH CONTRAST   TECHNIQUE: Multidetector CT imaging of the chest, abdomen and pelvis was performed following the standard protocol during bolus administration of intravenous contrast.  CONTRAST:  180mL OMNIPAQUE IOHEXOL 300 MG/ML  SOLN  COMPARISON:  12/14/2013.  FINDINGS: CT CHEST FINDINGS  Radiation changes in the left lower lobe/paramediastinal region. Radiation changes in the central right lower lobe.  5 x 4 mm subpleural nodule in the posterior right upper lobe (series 5/image 15), unchanged. No new/suspicious pulmonary nodules.  Moderate centrilobular and paraseptal emphysematous changes, upper lobe predominant.  No pleural effusion or pneumothorax.  Visualized thyroid is unremarkable.  The heart is top-normal in size. Moderate pericardial effusion, unchanged. Coronary atherosclerosis in the left main coronary artery and LAD. Atherosclerotic calcifications of the aortic arch.  Small mediastinal lymph nodes measuring up to 12 mm short axis in the right paratracheal region (series 2/ image 23), previously 11 mm. 11 mm short axis right hilar  node, previously 12 mm.  Mild degenerative changes of the thoracic spine.  CT ABDOMEN AND PELVIS FINDINGS  Hepatobiliary: Liver is within normal limits. No suspicious/enhancing hepatic lesions.  Status post hysterectomy. No intrahepatic or extrahepatic ductal dilatation.  Pancreas: Pancreas is notable for a 9 mm nonaggressive cyst along the inferior aspect of the pancreatic body (series 2/ image 52), grossly unchanged. No associated dilatation of the main pancreatic duct.  Spleen: Calcifications along the lateral spleen (series 2/image 49), chronic.  Adrenals/Urinary Tract: Adrenal glands are unremarkable.  Kidneys are within normal limits.  No hydronephrosis.  Bladder is within normal limits.  Stomach/Bowel: Stomach is unremarkable.  No evidence of bowel obstruction.  Normal appendix.  Vascular/Lymphatic: 5.0 x 5.4 cm infrarenal abdominal aortic aneurysm (series 2/ image 71), unchanged.  Atherosclerotic calcifications of the abdominal aorta and branch vessels.  No suspicious abdominopelvic lymphadenopathy.  1.5 x 2.3 cm fat density lesion in the sigmoid mesentery (series 2/ image 100), likely reflecting fat necrosis.  Reproductive: Status post hysterectomy.  No adnexal masses.  Other: No abdominopelvic ascites.  Small fat containing bilateral inguinal hernias.  Musculoskeletal: Degenerative changes of the lumbar spine.  Stable wedge deformity of the L2 vertebral body.  IMPRESSION: Stable radiation changes in the lungs bilaterally.  Stable 4 x 5 mm right upper lobe pulmonary nodule.  No evidence of recurrent or metastatic disease in the chest, abdomen, or pelvis.  Stable 5.0 x 5.4 cm infrarenal abdominal aortic aneurysm.  Additional stable ancillary findings as above.   Electronically Signed   By: Julian Hy M.D.   On: 06/14/2014 11:03   ASSESSMENT AND PLAN: This is a very pleasant 63 years old white female with metastatic non-small cell lung cancer, adenocarcinoma status post stereotactic radiotherapy to brain lesion as well as concurrent chemoradiation and consolidation systemic chemotherapy with carboplatin and Alimta status post 3 cycles. She has no complaints today except for the chest congestion from recent acute bronchitis. She was treated with prednisone and Z pak. The recent CT scan of the chest, abdomen and pelvis showed no evidence for disease progression. I discussed the scan results with the patient today. I recommended for her to continue on observation with repeat CT scan of the chest, abdomen and pelvis in 6 months. I counseled the patient today about the smoke cessation but unfortunately she is not willing to quit smoking. The patient has leukocytosis today most likely secondary to the recent bronchitis and treatment with prednisone. She was advised to call immediately if she has any concerning symptoms in the interval.  The patient voices understanding of current  disease status and treatment options and is in agreement with the current care plan. I strongly encouraged her to quit smoking. All questions were answered. The patient knows to call the clinic with any problems, questions or concerns. We can certainly see the patient much sooner if necessary.  Disclaimer: This note was dictated with voice recognition software. Similar sounding words can inadvertently be transcribed and may not be corrected upon review.

## 2014-06-22 ENCOUNTER — Ambulatory Visit
Admission: RE | Admit: 2014-06-22 | Discharge: 2014-06-22 | Disposition: A | Discharge status: Home / Self Care | Payer: Medicare HMO | Source: Ambulatory Visit | Attending: Radiation Oncology | Admitting: Radiation Oncology

## 2014-06-22 DIAGNOSIS — C7931 Secondary malignant neoplasm of brain: Secondary | ICD-10-CM

## 2014-06-22 MED ORDER — GADOBENATE DIMEGLUMINE 529 MG/ML IV SOLN
19.0000 mL | Freq: Once | INTRAVENOUS | Status: AC | PRN
  Administered 2014-06-22: 19 mL via INTRAVENOUS

## 2014-06-26 ENCOUNTER — Ambulatory Visit
Admission: RE | Admit: 2014-06-26 | Discharge: 2014-06-26 | Disposition: A | Discharge status: Home / Self Care | Payer: Medicare HMO | Source: Ambulatory Visit | Attending: Radiation Oncology | Admitting: Radiation Oncology

## 2014-06-26 ENCOUNTER — Encounter: Payer: Self-pay | Admitting: Radiation Therapy

## 2014-06-26 ENCOUNTER — Encounter: Payer: Self-pay | Admitting: Radiation Oncology

## 2014-06-26 VITALS — BP 120/66 | HR 87 | Resp 16 | Wt 202.9 lb

## 2014-06-26 DIAGNOSIS — C7931 Secondary malignant neoplasm of brain: Secondary | ICD-10-CM

## 2014-06-26 DIAGNOSIS — C7949 Secondary malignant neoplasm of other parts of nervous system: Secondary | ICD-10-CM

## 2014-06-26 DIAGNOSIS — Z923 Personal history of irradiation: Secondary | ICD-10-CM

## 2014-06-26 DIAGNOSIS — T66XXXD Radiation sickness, unspecified, subsequent encounter: Secondary | ICD-10-CM

## 2014-06-26 DIAGNOSIS — C719 Malignant neoplasm of brain, unspecified: Secondary | ICD-10-CM

## 2014-06-26 MED ORDER — GABAPENTIN 400 MG PO CAPS: 800.0000 mg | ORAL_CAPSULE | Freq: Three times a day (TID) | ORAL | Status: DC

## 2014-06-26 NOTE — Progress Notes (Signed)
Radiation Oncology         [id number]   Name: Courtney West   Date: 06/26/2014   MRN: [id number]  DOB: 1951-04-10    Multidisciplinary Brain and Spine Oncology Clinic Follow-Up Visit Note  CC: Shirline Frees, MD  Shirline Frees, MD    ICD-9-CM ICD-10-CM   1. Brain cancer 191.9 C71.9 gabapentin (NEURONTIN) 400 MG capsule  2. History of radiation therapy V15.3 Z92.3 gabapentin (NEURONTIN) 400 MG capsule  3. Radiation adverse effect, subsequent encounter V58.89 T66.XXXD gabapentin (NEURONTIN) 400 MG capsule   990    4. Brain Metastases 198.3 C79.31     Diagnosis:   64 year old woman with metastatic lung cancer status post:  1. SRS to a 10-mm left frontal and 5-mm right frontal brain metastasis to 20 Gy on January 16, 2011.  2. The patient's primary lung cancer was treated to 66 Gy in 33 fractions of 2 Gy through March 10, 2011 . 3. SRS to 6 new brain metastases (2 adjacent left temporal brain metastases, a 4.7-mm vermis target, a 3.4-mm right frontal brain metastasis, a left parietal 5.3-mm brain metastasis, and a 6.6-mm left cerebellar brain metastasis) received 20 Gy in a single fraction.April 18, 2011. 4. SRS to a 4 mm posterior frontal target to 20 Gy on 10/20/2011 5. SBRT to a 1.6 cm metachronous clinical stage IA primary cancer of the right lower lung on 01/19/12, 01/21/12, and 01/26/12 to 54 gray in 3 fractions of 18 gray  Interval Since Last Radiation:  28  months  Narrative:  The patient returns today for routine follow-up.  The recent films were presented in our multidisciplinary conference with neuroradiology just prior to the clinic.  Reports mild headache related to stress because "I was running late." Denies nausea, vomiting, dizziness, diplopia, or ringing in the ears. Weight and vitals stable.                               ALLERGIES:  is allergic to carboplatin; codeine; sulfa antibiotics; dilaudid; and penicillins.  Meds: Current Outpatient Prescriptions    Medication Sig Dispense Refill  . albuterol (PROVENTIL HFA;VENTOLIN HFA) 108 (90 BASE) MCG/ACT inhaler Inhale 2 puffs into the lungs every 4 (four) hours as needed for wheezing or shortness of breath. 6.7 g 0  . benzonatate (TESSALON) 100 MG capsule Take 1 capsule (100 mg total) by mouth every 8 (eight) hours. 21 capsule 0  . budesonide-formoterol (SYMBICORT) 80-4.5 MCG/ACT inhaler Inhale 2 puffs into the lungs as needed. 1 Inhaler 5  . eszopiclone (LUNESTA) 2 MG TABS tablet Take 1 tablet (2 mg total) by mouth at bedtime as needed for sleep. Take immediately before bedtime 30 tablet 5  . gabapentin (NEURONTIN) 400 MG capsule Take 2 capsules (800 mg total) by mouth 3 (three) times daily. 180 capsule 6  . ibuprofen (ADVIL,MOTRIN) 200 MG tablet Take 600 mg by mouth every 6 (six) hours as needed for headache.     No current facility-administered medications for this encounter.    Physical Findings: The patient is in no acute distress. Patient is alert and oriented.  weight is 202 lb 14.4 oz (92.035 kg). Her blood pressure is 120/66 and her pulse is 87. Her respiration is 16. .  No significant changes.  Lab Findings: Lab Results  Component Value Date   WBC 15.2* 06/14/2014   HGB 16.8* 06/14/2014   HCT 48.9* 06/14/2014   MCV 93.7 06/14/2014  PLT 197 06/14/2014    @LASTCHEM @  Radiographic Findings: Dg Chest 2 View  06/04/2014   CLINICAL DATA:  Cough, congestion, and difficulty breathing. History of lung cancer with brain metastasis.  EXAM: CHEST  2 VIEW  COMPARISON:  11/26/2013  FINDINGS: Mild cardiac enlargement. Scarring in the mid lungs and left hilum consistent with prior radiation change. No change since prior study. Probable emphysematous changes in the upper lungs. Heart for type central venous catheter is unchanged. No pneumothorax. No blunting of costophrenic angles. No developing airspace disease. Calcification of the aorta. Compression of L2 is demonstrated similar to previous  studies.  IMPRESSION: No evidence of active pulmonary disease. Unchanged scarring in the mid lungs and left hilum consistent with previous radiation changes.   Electronically Signed   By: Lucienne Capers M.D.   On: 06/04/2014 01:20   Ct Chest W Contrast  06/14/2014   CLINICAL DATA:  Lung cancer diagnosed 2012, chemotherapy/ XRT complete. Shortness of breath. Prior cholecystectomy.  EXAM: CT CHEST, ABDOMEN, AND PELVIS WITH CONTRAST  TECHNIQUE: Multidetector CT imaging of the chest, abdomen and pelvis was performed following the standard protocol during bolus administration of intravenous contrast.  CONTRAST:  150mL OMNIPAQUE IOHEXOL 300 MG/ML  SOLN  COMPARISON:  12/14/2013.  FINDINGS: CT CHEST FINDINGS  Radiation changes in the left lower lobe/paramediastinal region. Radiation changes in the central right lower lobe.  5 x 4 mm subpleural nodule in the posterior right upper lobe (series 5/image 15), unchanged. No new/suspicious pulmonary nodules.  Moderate centrilobular and paraseptal emphysematous changes, upper lobe predominant.  No pleural effusion or pneumothorax.  Visualized thyroid is unremarkable.  The heart is top-normal in size. Moderate pericardial effusion, unchanged. Coronary atherosclerosis in the left main coronary artery and LAD. Atherosclerotic calcifications of the aortic arch.  Small mediastinal lymph nodes measuring up to 12 mm short axis in the right paratracheal region (series 2/ image 23), previously 11 mm. 11 mm short axis right hilar node, previously 12 mm.  Mild degenerative changes of the thoracic spine.  CT ABDOMEN AND PELVIS FINDINGS  Hepatobiliary: Liver is within normal limits. No suspicious/enhancing hepatic lesions.  Status post hysterectomy. No intrahepatic or extrahepatic ductal dilatation.  Pancreas: Pancreas is notable for a 9 mm nonaggressive cyst along the inferior aspect of the pancreatic body (series 2/ image 52), grossly unchanged. No associated dilatation of the main  pancreatic duct.  Spleen: Calcifications along the lateral spleen (series 2/image 49), chronic.  Adrenals/Urinary Tract: Adrenal glands are unremarkable.  Kidneys are within normal limits.  No hydronephrosis.  Bladder is within normal limits.  Stomach/Bowel: Stomach is unremarkable.  No evidence of bowel obstruction.  Normal appendix.  Vascular/Lymphatic: 5.0 x 5.4 cm infrarenal abdominal aortic aneurysm (series 2/ image 71), unchanged. Atherosclerotic calcifications of the abdominal aorta and branch vessels.  No suspicious abdominopelvic lymphadenopathy.  1.5 x 2.3 cm fat density lesion in the sigmoid mesentery (series 2/ image 100), likely reflecting fat necrosis.  Reproductive: Status post hysterectomy.  No adnexal masses.  Other: No abdominopelvic ascites.  Small fat containing bilateral inguinal hernias.  Musculoskeletal: Degenerative changes of the lumbar spine.  Stable wedge deformity of the L2 vertebral body.  IMPRESSION: Stable radiation changes in the lungs bilaterally.  Stable 4 x 5 mm right upper lobe pulmonary nodule.  No evidence of recurrent or metastatic disease in the chest, abdomen, or pelvis.  Stable 5.0 x 5.4 cm infrarenal abdominal aortic aneurysm.  Additional stable ancillary findings as above.   Electronically Signed  By: Julian Hy M.D.   On: 06/14/2014 11:03   Mr Jeri Cos NF Contrast  06/22/2014   CLINICAL DATA:  Metastatic non-small cell lung cancer. Previous stereotactic radiosurgery and chemotherapy. No current therapy.  EXAM: MRI HEAD WITHOUT AND WITH CONTRAST  TECHNIQUE: Multiplanar, multiecho pulse sequences of the brain and surrounding structures were obtained without and with intravenous contrast.  CONTRAST:  69mL MULTIHANCE GADOBENATE DIMEGLUMINE 529 MG/ML IV SOLN  COMPARISON:  12/2013  FINDINGS: There is no evidence of acute infarct, intracranial hemorrhage, midline shift, or extra-axial fluid collection. Ventricles are normal for age. Right cerebellar encephalomalacia  is again seen. Small, scattered foci of T2 hyperintensity in the cerebral white matter do not appear significantly changed and are nonspecific but may reflect mild chronic small vessel ischemic disease.  Enhancing lesions measure 6 mm in the left cerebellum (series 10, image 41), 6 x 5 mm in the left temporal lobe (series 10, image 52), 6 x 5 mm in the right frontal lobe (series 10, image 90), 3 mm more posteriorly in the right frontal lobe (series 10, image 90), and 4 mm in the posterior left frontal lobe (series 10, image 110), all unchanged. No new lesions are identified.  Orbits are unremarkable. Mild mucosal thickening is present in the right greater than left maxillary sinuses with a small amount of secretions in the right maxillary sinus. Mastoid air cells are clear. Major intracranial vascular flow voids are preserved.  IMPRESSION: Five small metastases without significant interval change. No new lesions.   Electronically Signed   By: Logan Bores   On: 06/22/2014 15:42   Ct Abdomen Pelvis W Contrast  06/14/2014   CLINICAL DATA:  Lung cancer diagnosed 2012, chemotherapy/ XRT complete. Shortness of breath. Prior cholecystectomy.  EXAM: CT CHEST, ABDOMEN, AND PELVIS WITH CONTRAST  TECHNIQUE: Multidetector CT imaging of the chest, abdomen and pelvis was performed following the standard protocol during bolus administration of intravenous contrast.  CONTRAST:  176mL OMNIPAQUE IOHEXOL 300 MG/ML  SOLN  COMPARISON:  12/14/2013.  FINDINGS: CT CHEST FINDINGS  Radiation changes in the left lower lobe/paramediastinal region. Radiation changes in the central right lower lobe.  5 x 4 mm subpleural nodule in the posterior right upper lobe (series 5/image 15), unchanged. No new/suspicious pulmonary nodules.  Moderate centrilobular and paraseptal emphysematous changes, upper lobe predominant.  No pleural effusion or pneumothorax.  Visualized thyroid is unremarkable.  The heart is top-normal in size. Moderate pericardial  effusion, unchanged. Coronary atherosclerosis in the left main coronary artery and LAD. Atherosclerotic calcifications of the aortic arch.  Small mediastinal lymph nodes measuring up to 12 mm short axis in the right paratracheal region (series 2/ image 23), previously 11 mm. 11 mm short axis right hilar node, previously 12 mm.  Mild degenerative changes of the thoracic spine.  CT ABDOMEN AND PELVIS FINDINGS  Hepatobiliary: Liver is within normal limits. No suspicious/enhancing hepatic lesions.  Status post hysterectomy. No intrahepatic or extrahepatic ductal dilatation.  Pancreas: Pancreas is notable for a 9 mm nonaggressive cyst along the inferior aspect of the pancreatic body (series 2/ image 52), grossly unchanged. No associated dilatation of the main pancreatic duct.  Spleen: Calcifications along the lateral spleen (series 2/image 49), chronic.  Adrenals/Urinary Tract: Adrenal glands are unremarkable.  Kidneys are within normal limits.  No hydronephrosis.  Bladder is within normal limits.  Stomach/Bowel: Stomach is unremarkable.  No evidence of bowel obstruction.  Normal appendix.  Vascular/Lymphatic: 5.0 x 5.4 cm infrarenal abdominal aortic aneurysm (series  2/ image 71), unchanged. Atherosclerotic calcifications of the abdominal aorta and branch vessels.  No suspicious abdominopelvic lymphadenopathy.  1.5 x 2.3 cm fat density lesion in the sigmoid mesentery (series 2/ image 100), likely reflecting fat necrosis.  Reproductive: Status post hysterectomy.  No adnexal masses.  Other: No abdominopelvic ascites.  Small fat containing bilateral inguinal hernias.  Musculoskeletal: Degenerative changes of the lumbar spine.  Stable wedge deformity of the L2 vertebral body.  IMPRESSION: Stable radiation changes in the lungs bilaterally.  Stable 4 x 5 mm right upper lobe pulmonary nodule.  No evidence of recurrent or metastatic disease in the chest, abdomen, or pelvis.  Stable 5.0 x 5.4 cm infrarenal abdominal aortic  aneurysm.  Additional stable ancillary findings as above.   Electronically Signed   By: Julian Hy M.D.   On: 06/14/2014 11:03    Impression:  The patient has no evidence of active intracranial metastases at this time. Although untreated, several of her brain metastases remain stable.  Plan:  Repeat MRI in 6 months then follow-up.  _____________________________________  Sheral Apley. Tammi Klippel, M.D.

## 2014-06-26 NOTE — Progress Notes (Signed)
Reports mild headache related to stress because "I was running late." Denies nausea, vomiting, dizziness, diplopia, or ringing in the ears. Weight and vitals stable.

## 2014-06-28 ENCOUNTER — Other Ambulatory Visit: Payer: Self-pay | Admitting: Radiation Oncology

## 2014-07-28 ENCOUNTER — Telehealth: Payer: Self-pay | Admitting: Radiation Oncology

## 2014-07-28 NOTE — Telephone Encounter (Signed)
Received message from [place], [place] sister, concerned the patient "had lost a whole day." Phoned patient. Patient seemed very alert and answered all questions appropriately. Patient reports the day her sister was referring to was Tuesday when she "was sick."Patient went on to explain she was sick Tuesday with a sore throat and headache. Patient reports headache has resolved. Denies nausea, vomiting, dizziness, diplopia or ringing in the ears. Reports she is "walking fine." Denies falling. Denies any needs at this time. Patient understands to contact this nurse with needs.

## 2014-07-31 ENCOUNTER — Telehealth: Payer: Self-pay | Admitting: Radiation Oncology

## 2014-07-31 NOTE — Telephone Encounter (Signed)
Returned telephone message left by patient's sister, Brand Males. No answer. Left message requesting return call.

## 2014-07-31 NOTE — Telephone Encounter (Signed)
Sister, Courtney West, called back concerned about the patient. She explains that Courtney West, the other sister, went last Tuesday to visit the patient. Courtney West reports that Courtney West told her the patient was walking around the house in nothing but, a t-shirt while she was there. Also, she reports the patient seemed to be in a daze. Courtney West is not on this patient's release of information form. Expressed appreciation for the call and her concern. Will continue to check in routinely with this patient. Patient denies that she has any needs at the moment.

## 2014-08-01 ENCOUNTER — Telehealth: Payer: Self-pay | Admitting: Radiation Oncology

## 2014-08-01 NOTE — Telephone Encounter (Signed)
Called to check in on the patient. No answer on either her home or cell phone. No option on either to leave a message.

## 2014-08-23 ENCOUNTER — Telehealth: Payer: Self-pay | Admitting: Radiation Oncology

## 2014-08-23 NOTE — Telephone Encounter (Signed)
Patient's sister, Tye [place], called today concerned because she hasn't been able to reach her sister via phone in three days. Phoned [place] on her cell. She promptly answered and confirmed she is "doing fine." She states, "I will call her thanks for checking on me." Encouraged patient to call this nurse with future needs."

## 2014-09-13 ENCOUNTER — Other Ambulatory Visit: Payer: Self-pay | Admitting: Radiation Therapy

## 2014-09-13 DIAGNOSIS — C7931 Secondary malignant neoplasm of brain: Secondary | ICD-10-CM

## 2014-11-23 ENCOUNTER — Telehealth: Payer: Self-pay | Admitting: Radiation Oncology

## 2014-11-23 ENCOUNTER — Other Ambulatory Visit: Payer: Self-pay | Admitting: Radiation Oncology

## 2014-11-23 DIAGNOSIS — C7931 Secondary malignant neoplasm of brain: Secondary | ICD-10-CM

## 2014-11-23 DIAGNOSIS — I714 Abdominal aortic aneurysm, without rupture, unspecified: Secondary | ICD-10-CM

## 2014-11-23 DIAGNOSIS — M5136 Other intervertebral disc degeneration, lumbar region: Secondary | ICD-10-CM

## 2014-11-23 DIAGNOSIS — C7949 Secondary malignant neoplasm of other parts of nervous system: Secondary | ICD-10-CM

## 2014-11-23 DIAGNOSIS — M51369 Other intervertebral disc degeneration, lumbar region without mention of lumbar back pain or lower extremity pain: Secondary | ICD-10-CM

## 2014-11-23 MED ORDER — DICLOFENAC SODIUM 1 % TD GEL: 2.0000 g | Freq: Four times a day (QID) | TRANSDERMAL | Status: DC

## 2014-11-23 NOTE — Telephone Encounter (Signed)
Phoned patient at home to discuss new script of Voltaren and CT scan. Patient not available. Spoke with her sister, Katharine Look. Explained a medication is ready for pick up at [place], [place]. Asked Katharine Look to have [facility] call this Therapist, sports. She verbalized understanding.

## 2014-11-24 ENCOUNTER — Telehealth: Payer: Self-pay | Admitting: Radiation Oncology

## 2014-11-24 NOTE — Telephone Encounter (Signed)
Phoned patient to ensure she is aware of upcoming scan appointments and that Voltaren was called to her pharmacy. Patient verbalized understanding and confirmed she would be present for upcoming scans.

## 2014-12-11 ENCOUNTER — Ambulatory Visit (HOSPITAL_COMMUNITY)
Admission: RE | Admit: 2014-12-11 | Discharge: 2014-12-11 | Disposition: A | Discharge status: Home / Self Care | Payer: Medicare HMO | Source: Ambulatory Visit | Attending: Internal Medicine | Admitting: Internal Medicine

## 2014-12-11 ENCOUNTER — Other Ambulatory Visit (HOSPITAL_BASED_OUTPATIENT_CLINIC_OR_DEPARTMENT_OTHER): Payer: Medicare HMO

## 2014-12-11 ENCOUNTER — Other Ambulatory Visit: Payer: Medicare HMO

## 2014-12-11 ENCOUNTER — Encounter (HOSPITAL_COMMUNITY): Payer: Self-pay

## 2014-12-11 DIAGNOSIS — D72829 Elevated white blood cell count, unspecified: Secondary | ICD-10-CM | POA: Diagnosis not present

## 2014-12-11 DIAGNOSIS — C3491 Malignant neoplasm of unspecified part of right bronchus or lung: Secondary | ICD-10-CM | POA: Diagnosis present

## 2014-12-11 DIAGNOSIS — C3412 Malignant neoplasm of upper lobe, left bronchus or lung: Secondary | ICD-10-CM

## 2014-12-11 DIAGNOSIS — C7931 Secondary malignant neoplasm of brain: Secondary | ICD-10-CM | POA: Diagnosis not present

## 2014-12-11 DIAGNOSIS — C7949 Secondary malignant neoplasm of other parts of nervous system: Secondary | ICD-10-CM

## 2014-12-11 DIAGNOSIS — I714 Abdominal aortic aneurysm, without rupture, unspecified: Secondary | ICD-10-CM

## 2014-12-11 DIAGNOSIS — M5136 Other intervertebral disc degeneration, lumbar region: Secondary | ICD-10-CM

## 2014-12-11 LAB — CBC WITH DIFFERENTIAL/PLATELET
BASO%: 1 % (ref 0.0–2.0)
Basophils Absolute: 0.1 10*3/uL (ref 0.0–0.1)
EOS%: 2.3 % (ref 0.0–7.0)
Eosinophils Absolute: 0.3 10*3/uL (ref 0.0–0.5)
HCT: 48.9 % — ABNORMAL HIGH (ref 34.8–46.6)
HGB: 16.4 g/dL — ABNORMAL HIGH (ref 11.6–15.9)
LYMPH%: 24.4 % (ref 14.0–49.7)
MCH: 31 pg (ref 25.1–34.0)
MCHC: 33.4 g/dL (ref 31.5–36.0)
MCV: 92.8 fL (ref 79.5–101.0)
MONO#: 0.7 10*3/uL (ref 0.1–0.9)
MONO%: 6.7 % (ref 0.0–14.0)
NEUT#: 7.4 10*3/uL — ABNORMAL HIGH (ref 1.5–6.5)
NEUT%: 65.6 % (ref 38.4–76.8)
PLATELETS: 182 10*3/uL (ref 145–400)
RBC: 5.27 10*6/uL (ref 3.70–5.45)
RDW: 14 % (ref 11.2–14.5)
WBC: 11.2 10*3/uL — ABNORMAL HIGH (ref 3.9–10.3)
lymph#: 2.7 10*3/uL (ref 0.9–3.3)

## 2014-12-11 LAB — BUN AND CREATININE (CC13)
BUN: 10.9 mg/dL (ref 7.0–26.0)
Creatinine: 0.9 mg/dL (ref 0.6–1.1)
EGFR: 71 mL/min/{1.73_m2} — ABNORMAL LOW (ref >90)

## 2014-12-11 LAB — COMPREHENSIVE METABOLIC PANEL (CC13)
ALT: 21 U/L (ref 0–55)
ANION GAP: 11 meq/L (ref 3–11)
AST: 19 U/L (ref 5–34)
Albumin: 3.8 g/dL (ref 3.5–5.0)
Alkaline Phosphatase: 93 U/L (ref 40–150)
BILIRUBIN TOTAL: 0.42 mg/dL (ref 0.20–1.20)
BUN: 11 mg/dL (ref 7.0–26.0)
CO2: 28 mEq/L (ref 22–29)
CREATININE: 0.9 mg/dL (ref 0.6–1.1)
Calcium: 9.7 mg/dL (ref 8.4–10.4)
Chloride: 104 mEq/L (ref 98–109)
EGFR: 71 mL/min/{1.73_m2} — ABNORMAL LOW (ref >90)
GLUCOSE: 103 mg/dL (ref 70–140)
Potassium: 5 mEq/L (ref 3.5–5.1)
Sodium: 143 mEq/L (ref 136–145)
Total Protein: 7.3 g/dL (ref 6.4–8.3)

## 2014-12-11 MED ORDER — IOHEXOL 300 MG/ML  SOLN
100.0000 mL | Freq: Once | INTRAMUSCULAR | Status: AC | PRN
  Administered 2014-12-11: 100 mL via INTRAVENOUS

## 2014-12-18 ENCOUNTER — Ambulatory Visit
Admission: RE | Admit: 2014-12-18 | Discharge: 2014-12-18 | Disposition: A | Discharge status: Home / Self Care | Payer: Medicare HMO | Source: Ambulatory Visit | Attending: Radiation Oncology | Admitting: Radiation Oncology

## 2014-12-18 ENCOUNTER — Encounter: Payer: Self-pay | Admitting: Internal Medicine

## 2014-12-18 ENCOUNTER — Ambulatory Visit (HOSPITAL_BASED_OUTPATIENT_CLINIC_OR_DEPARTMENT_OTHER): Payer: Medicare HMO | Admitting: Internal Medicine

## 2014-12-18 ENCOUNTER — Telehealth: Payer: Self-pay | Admitting: Internal Medicine

## 2014-12-18 VITALS — BP 100/55 | HR 96 | Temp 98.2°F | Resp 18 | Ht 63.0 in | Wt 194.3 lb

## 2014-12-18 DIAGNOSIS — C7949 Secondary malignant neoplasm of other parts of nervous system: Secondary | ICD-10-CM

## 2014-12-18 DIAGNOSIS — C7989 Secondary malignant neoplasm of other specified sites: Secondary | ICD-10-CM

## 2014-12-18 DIAGNOSIS — C7931 Secondary malignant neoplasm of brain: Secondary | ICD-10-CM

## 2014-12-18 DIAGNOSIS — C3412 Malignant neoplasm of upper lobe, left bronchus or lung: Secondary | ICD-10-CM | POA: Diagnosis not present

## 2014-12-18 DIAGNOSIS — C3491 Malignant neoplasm of unspecified part of right bronchus or lung: Secondary | ICD-10-CM

## 2014-12-18 MED ORDER — GADOBENATE DIMEGLUMINE 529 MG/ML IV SOLN
18.0000 mL | Freq: Once | INTRAVENOUS | Status: AC | PRN
  Administered 2014-12-18: 18 mL via INTRAVENOUS

## 2014-12-18 NOTE — Progress Notes (Signed)
[facility] [phone]   [phone]  OFFICE PROGRESS NOTE  Shirline Frees, MD [place] [address]  DIAGNOSIS: Metastatic non-small cell lung cancer, favoring adenocarcinoma diagnosed in July of 2012, presented with locally advanced disease in the chest as well as brain metastasis. Veristrat test poor.   PRIOR THERAPY:  1. Status post stereotactic radiotherapy to 2 brain lesions under the care of Dr. Lisbeth Renshaw. 2. Status post a course of concurrent chemoradiation with weekly carboplatin and paclitaxel, last dose of chemotherapy was given 03/05/2011. 3. Systemic chemotherapy with carboplatin for AUC of 5 and Alimta 500 mg/M2. The patient is status post 3 cycles. Last dose was given 06/03/2011  CURRENT THERAPY: Observation.  INTERVAL HISTORY: Courtney West 64 y.o. female returns to the clinic today for routine six-month followup visit  the patient is feeling fine today with no specific complaints except for mild fatigue and shortness breath with exertion. Unfortunately she continues to smoke and unwilling to quit smoking. She denied having any significant chest pain, shortness of breath, cough or hemoptysis. She has no weight loss or night sweats. The patient had repeat CT scan of the chest, abdomen and pelvis performed recently and she is here for evaluation and discussion of her scan results.  MEDICAL HISTORY: Past Medical History  Diagnosis Date  . Hypertension   . COPD (chronic obstructive pulmonary disease)   . Stroke   . Abdominal aortic aneurysm   . Dyslipidemia   . S/P radiation therapy 7/12 thru 9/12, 04/18/11    xrt to brain mets  . Lung cancer 12/2010  . Brain cancer     mets from lung primary  . Allergy     codeine,pcn,sulfa drugs  . Radiation 10/20/2011    frontal mets/Palliation  . History of radiation therapy 01/19/12, 01/21/12, 01/26/12    RLL lung  . History of radiation therapy 04/18/11    single  fraction to 6 brain mets  . Clotted vascular catheter 03/30/2012  . Lung cancer 12/22/2010    ALLERGIES:  is allergic to carboplatin; codeine; sulfa antibiotics; dilaudid; and penicillins.  MEDICATIONS:  Current Outpatient Prescriptions  Medication Sig Dispense Refill  . albuterol (PROVENTIL HFA;VENTOLIN HFA) 108 (90 BASE) MCG/ACT inhaler Inhale 2 puffs into the lungs every 4 (four) hours as needed for wheezing or shortness of breath. 6.7 g 0  . benzonatate (TESSALON) 100 MG capsule Take 1 capsule (100 mg total) by mouth every 8 (eight) hours. 21 capsule 0  . diclofenac sodium (VOLTAREN) 1 % GEL Apply 2 g topically 4 (four) times daily. 100 g 5  . eszopiclone (LUNESTA) 2 MG TABS tablet Take 1 tablet (2 mg total) by mouth at bedtime as needed for sleep. Take immediately before bedtime 30 tablet 5  . gabapentin (NEURONTIN) 300 MG capsule Take 300 mg by mouth daily.    Marland Kitchen ibuprofen (ADVIL,MOTRIN) 200 MG tablet Take 600 mg by mouth every 6 (six) hours as needed for headache.    . SYMBICORT 80-4.5 MCG/ACT inhaler INHALE TWO PUFFS INTO LUNGS AS NEEDED 2 Inhaler 5   No current facility-administered medications for this visit.    SURGICAL HISTORY:  Past Surgical History  Procedure Laterality Date  . Carpal tunnel release    . Left knee surgery    . Cholecystectomy    . Partial hysterectomy      REVIEW OF SYSTEMS:  A comprehensive review of systems was negative except for: Constitutional: positive  for fatigue Respiratory: positive for dyspnea on exertion   PHYSICAL EXAMINATION: General appearance: alert, cooperative, fatigued and no distress Head: Normocephalic, without obvious abnormality, atraumatic Neck: no adenopathy, no JVD, supple, symmetrical, trachea midline and thyroid not enlarged, symmetric, no tenderness/mass/nodules Lymph nodes: Cervical, supraclavicular, and axillary nodes normal. Resp: wheezes bilaterally Back: symmetric, no curvature. ROM normal. No CVA tenderness. Cardio:  regular rate and rhythm, S1, S2 normal, no murmur, click, rub or gallop GI: soft, non-tender; bowel sounds normal; no masses,  no organomegaly Extremities: extremities normal, atraumatic, no cyanosis or edema  ECOG PERFORMANCE STATUS: 1 - Symptomatic but completely ambulatory  Blood pressure 100/55, pulse 96, temperature 98.2 F (36.8 C), temperature source Oral, resp. rate 18, height '5\' 3"'$  (1.6 m), weight 194 lb 4.8 oz (88.134 kg), SpO2 92 %.  LABORATORY DATA: Lab Results  Component Value Date   WBC 11.2* 12/11/2014   HGB 16.4* 12/11/2014   HCT 48.9* 12/11/2014   MCV 92.8 12/11/2014   PLT 182 12/11/2014      Chemistry      Component Value Date/Time   NA 143 12/11/2014 0839   NA 139 07/12/2013 2120   NA 143 12/24/2011 1311   K 5.0 12/11/2014 0839   K 3.6* 07/12/2013 2120   K 4.7 12/24/2011 1311   CL 103 07/12/2013 2120   CL 106 10/06/2012 1303   CL 99 12/24/2011 1311   CO2 28 12/11/2014 0839   CO2 23 07/12/2013 2120   CO2 31 12/24/2011 1311   BUN 10.9 12/11/2014 0840   BUN 9 07/12/2013 2120   BUN 13 12/24/2011 1311   CREATININE 0.9 12/11/2014 0840   CREATININE 0.65 07/12/2013 2120   CREATININE 1.2 12/24/2011 1311      Component Value Date/Time   CALCIUM 9.7 12/11/2014 0839   CALCIUM 8.8 07/12/2013 2120   CALCIUM 10.2 12/24/2011 1311   ALKPHOS 93 12/11/2014 0839   ALKPHOS 93 07/12/2013 2120   ALKPHOS 98* 12/24/2011 1311   AST 19 12/11/2014 0839   AST 14 07/12/2013 2120   AST 18 12/24/2011 1311   ALT 21 12/11/2014 0839   ALT 14 07/12/2013 2120   ALT 18 12/24/2011 1311   BILITOT 0.42 12/11/2014 0839   BILITOT 0.2* 07/12/2013 2120   BILITOT 0.50 12/24/2011 1311       RADIOGRAPHIC STUDIES: Ct Chest W Contrast  12/11/2014   CLINICAL DATA:  Subsequent encounter for lung cancer.  EXAM: CT CHEST, ABDOMEN, AND PELVIS WITH CONTRAST  TECHNIQUE: Multidetector CT imaging of the chest, abdomen and pelvis was performed following the standard protocol during bolus  administration of intravenous contrast.  CONTRAST:  176m OMNIPAQUE IOHEXOL 300 MG/ML  SOLN  COMPARISON:  06/14/2014.  03/29/2012.  FINDINGS: CT CHEST FINDINGS  Mediastinum/Nodes: The tip of the right-sided Port-A-Cath is in the distal SVC. There is no axillary lymphadenopathy. 12 mm precarinal lymph node measured previously is 16 mm on the current study. 11 mm upper right hilar lymph node measured previously is now 12 mm 13 mm short axis inferior right hilar lymph node (image 22 series 3) was 7 mm previously. No subcarinal lymphadenopathy. Changes radiation fibrosis are again seen in the right hilum.  Heart size is stable.  Moderate pericardial effusion is unchanged.  Lungs/Pleura: Moderate to advanced emphysema again noted bilaterally. 5 mm posterior right upper lobe nodule (image 15 series 5) is stable. Soft tissue attenuation is identified in the central right lower lobe, as before likely reflects clips radiation change. However, the lateral and  posterior aspect of this opacities become more pronounced in confluent in the interval. This is well demonstrated on coronal reformations (see image 87 series 602 today and compare to image 77 of series 602 previously). Also compare axial image 32 of series 5 today to image 34 of series 5 previously.  Musculoskeletal: Bone windows reveal no worrisome lytic or sclerotic osseous lesions.  CT ABDOMEN AND PELVIS FINDINGS  Hepatobiliary: No focal abnormality within the liver parenchyma. Gallbladder is surgically absent. No intrahepatic or extrahepatic biliary dilation.  Pancreas: Stable 9-10 mm cystic lesion the pancreatic body when comparing back to the 2013 exam. This is compatible with a benign process. No dilatation of the main duct. No enhancing pancreatic mass.  Spleen: Stable in appearance with some capsular calcification laterally.  Adrenals/Urinary Tract: Stable appearance of bilateral adrenal thickening without nodularity. No and he seen lesion in either kidney. No  hydronephrosis. No evidence for hydroureter. Urinary bladder is unremarkable.  Stomach/Bowel: Stomach is nondistended. No gastric wall thickening. No evidence of outlet obstruction. Duodenum is normally positioned as is the ligament of Treitz. There is a tiny duodenum diverticulum noted. No small bowel wall thickening. No small bowel dilatation. Terminal ileum is normal. Appendix is normal. No gross colonic mass. No colonic wall thickening. No substantial diverticular change.  Vascular/Lymphatic: Fusiform infrarenal abdominal aortic aneurysm measures 5.6 x 5.2 cm today compared to 5.4 x 5.0 cm previously.  Note gastrohepatic or hepatoduodenal ligament lymphadenopathy. No para-aortic lymphadenopathy. No pelvic sidewall lymphadenopathy.  Reproductive: Uterus is surgically absent.  No adnexal mass.  Other: No intraperitoneal free fluid.  Musculoskeletal: Bone windows reveal no worrisome lytic or sclerotic osseous lesions. Stable compression deformity of the L2 vertebral body.  IMPRESSION: 1. Interval progression of soft tissue associated with probable post radiation scarring in the central right lower lobe. This is associated with an enlarging inferior right hilar lymph node. Together, imaging features are concerning for recurrent disease. 2. Stable 5 mm right upper lobe posterior pulmonary nodule. 3. Slight interval progression of abdominal aortic aneurysm, now measuring 5.6 cm in diameter. 4. Stable moderate pericardial effusion. 5. Postradiation changes in the left parahilar lung are stable.   Electronically Signed   By: Misty Stanley M.D.   On: 12/11/2014 10:56   Mr Jeri Cos EH Contrast  12/18/2014   CLINICAL DATA:  64 year old hypertensive female with history of lung cancer post stereotactic radiation surgery. Forty-seven month restaging. Subsequent encounter.  EXAM: MRI HEAD WITHOUT AND WITH CONTRAST  TECHNIQUE: Multiplanar, multiecho pulse sequences of the brain and surrounding structures were obtained without  and with intravenous contrast.  CONTRAST:  58m MULTIHANCE GADOBENATE DIMEGLUMINE 529 MG/ML IV SOLN  COMPARISON:  Most recent examination 06/22/2014.  FINDINGS: Previously described 5 intracranial sub cm enhancing lesions consistent with metastatic disease without significant change. As seen on series 10, posterior left frontal lobe (image 107), 2 lesions within the right frontal lobe (image 89), peripheral aspect of the left temporal lobe (image 49) and left cerebellum (image 38).  Question new 2 x 3 x 1 mm enhancing lesion superior vermis (series 10, image 59 and series 12, image 20) versus small vessel.  No acute infarct.  No intracranial hemorrhage.  Scattered T2 areas of altered signal intensity without significant change from prior exam and may reflect changes of prior tumor, treated tumor, residual tumor and/or small vessel disease type changes. Encephalomalacia most notable right cerebellum.  No hydrocephalus.  Major intracranial vascular structures are patent.  Progressive prominent mucosal thickening right maxillary sinus  measuring up to 1.5 cm with slight complexity. No intraorbital extension identified. Minimal mucosal thickening right frontal sinus mild mucosal thickening ethmoid sinus air cells bilaterally.  No primary worrisome orbital lesion/abnormality.  Cervical medullary junction and pineal region unremarkable. Slightly partially empty sella without change.  IMPRESSION: Previously described 5 intracranial sub cm enhancing lesions consistent with metastatic disease without significant change. As seen on series 10, posterior left frontal lobe (image 107), 2 lesions within the right frontal lobe (image 89), peripheral aspect of the left temporal lobe (image 49) and left cerebellum (image 38).  Question new 2 x 3 x 1 mm enhancing lesion superior vermis (series 10, image 59 and series 12, image 20) versus small vessel.  Progressive prominent mucosal thickening right maxillary sinus measuring up to 1.5  cm.  Please see above.   Electronically Signed   By: Genia Del M.D.   On: 12/18/2014 13:07   Ct Abdomen Pelvis W Contrast  12/11/2014   CLINICAL DATA:  Subsequent encounter for lung cancer.  EXAM: CT CHEST, ABDOMEN, AND PELVIS WITH CONTRAST  TECHNIQUE: Multidetector CT imaging of the chest, abdomen and pelvis was performed following the standard protocol during bolus administration of intravenous contrast.  CONTRAST:  171m OMNIPAQUE IOHEXOL 300 MG/ML  SOLN  COMPARISON:  06/14/2014.  03/29/2012.  FINDINGS: CT CHEST FINDINGS  Mediastinum/Nodes: The tip of the right-sided Port-A-Cath is in the distal SVC. There is no axillary lymphadenopathy. 12 mm precarinal lymph node measured previously is 16 mm on the current study. 11 mm upper right hilar lymph node measured previously is now 12 mm 13 mm short axis inferior right hilar lymph node (image 22 series 3) was 7 mm previously. No subcarinal lymphadenopathy. Changes radiation fibrosis are again seen in the right hilum.  Heart size is stable.  Moderate pericardial effusion is unchanged.  Lungs/Pleura: Moderate to advanced emphysema again noted bilaterally. 5 mm posterior right upper lobe nodule (image 15 series 5) is stable. Soft tissue attenuation is identified in the central right lower lobe, as before likely reflects clips radiation change. However, the lateral and posterior aspect of this opacities become more pronounced in confluent in the interval. This is well demonstrated on coronal reformations (see image 87 series 602 today and compare to image 77 of series 602 previously). Also compare axial image 32 of series 5 today to image 34 of series 5 previously.  Musculoskeletal: Bone windows reveal no worrisome lytic or sclerotic osseous lesions.  CT ABDOMEN AND PELVIS FINDINGS  Hepatobiliary: No focal abnormality within the liver parenchyma. Gallbladder is surgically absent. No intrahepatic or extrahepatic biliary dilation.  Pancreas: Stable 9-10 mm cystic  lesion the pancreatic body when comparing back to the 2013 exam. This is compatible with a benign process. No dilatation of the main duct. No enhancing pancreatic mass.  Spleen: Stable in appearance with some capsular calcification laterally.  Adrenals/Urinary Tract: Stable appearance of bilateral adrenal thickening without nodularity. No and he seen lesion in either kidney. No hydronephrosis. No evidence for hydroureter. Urinary bladder is unremarkable.  Stomach/Bowel: Stomach is nondistended. No gastric wall thickening. No evidence of outlet obstruction. Duodenum is normally positioned as is the ligament of Treitz. There is a tiny duodenum diverticulum noted. No small bowel wall thickening. No small bowel dilatation. Terminal ileum is normal. Appendix is normal. No gross colonic mass. No colonic wall thickening. No substantial diverticular change.  Vascular/Lymphatic: Fusiform infrarenal abdominal aortic aneurysm measures 5.6 x 5.2 cm today compared to 5.4 x 5.0 cm previously.  Note gastrohepatic or hepatoduodenal ligament lymphadenopathy. No para-aortic lymphadenopathy. No pelvic sidewall lymphadenopathy.  Reproductive: Uterus is surgically absent.  No adnexal mass.  Other: No intraperitoneal free fluid.  Musculoskeletal: Bone windows reveal no worrisome lytic or sclerotic osseous lesions. Stable compression deformity of the L2 vertebral body.  IMPRESSION: 1. Interval progression of soft tissue associated with probable post radiation scarring in the central right lower lobe. This is associated with an enlarging inferior right hilar lymph node. Together, imaging features are concerning for recurrent disease. 2. Stable 5 mm right upper lobe posterior pulmonary nodule. 3. Slight interval progression of abdominal aortic aneurysm, now measuring 5.6 cm in diameter. 4. Stable moderate pericardial effusion. 5. Postradiation changes in the left parahilar lung are stable.   Electronically Signed   By: Misty Stanley M.D.    On: 12/11/2014 10:56   ASSESSMENT AND PLAN: This is a very pleasant 64 years old white female with metastatic non-small cell lung cancer, adenocarcinoma status post stereotactic radiotherapy to brain lesion as well as concurrent chemoradiation and consolidation systemic chemotherapy with carboplatin and Alimta status post 3 cycles. The recent CT scan of the chest, abdomen and pelvis showed questionable disease progression with interval progression of the soft tissue associated with the postradiation scarring in the central right lower lobe with enlarging inferior right hilar lymph nodes. I discussed the scan results with the patient today and give her the option of close observation versus consideration of systemic chemotherapy.  The patient again mentioned that she is not interested in any systemic therapy and she would like to continue on observation. I would see her back for follow-up visit with repeat CT scan of the chest, abdomen and pelvis in 6 months. I counseled the patient today about the smoke cessation but unfortunately she is not willing to quit smoking. She was advised to call immediately if she has any concerning symptoms in the interval. The patient voices understanding of current disease status and treatment options and is in agreement with the current care plan. All questions were answered. The patient knows to call the clinic with any problems, questions or concerns. We can certainly see the patient much sooner if necessary.  Disclaimer: This note was dictated with voice recognition software. Similar sounding words can inadvertently be transcribed and may not be corrected upon review.

## 2014-12-18 NOTE — Telephone Encounter (Signed)
Pt confirmed labs/ov per 06/27 POF, gave pt AVS and Calendar... KJ, gave pt barium for CT

## 2014-12-19 ENCOUNTER — Encounter: Payer: Self-pay | Admitting: Radiation Therapy

## 2014-12-20 ENCOUNTER — Encounter: Payer: Self-pay | Admitting: Radiation Oncology

## 2014-12-20 ENCOUNTER — Ambulatory Visit
Admission: RE | Admit: 2014-12-20 | Discharge: 2014-12-20 | Disposition: A | Discharge status: Home / Self Care | Payer: Medicare HMO | Source: Ambulatory Visit | Attending: Radiation Oncology | Admitting: Radiation Oncology

## 2014-12-20 VITALS — BP 114/58 | HR 73 | Resp 16 | Wt 194.8 lb

## 2014-12-20 DIAGNOSIS — C7949 Secondary malignant neoplasm of other parts of nervous system: Principal | ICD-10-CM

## 2014-12-20 DIAGNOSIS — C7931 Secondary malignant neoplasm of brain: Secondary | ICD-10-CM

## 2014-12-20 DIAGNOSIS — C3491 Malignant neoplasm of unspecified part of right bronchus or lung: Secondary | ICD-10-CM

## 2014-12-20 MED ORDER — ALBUTEROL SULFATE HFA 108 (90 BASE) MCG/ACT IN AERS: 2.0000 | INHALATION_SPRAY | RESPIRATORY_TRACT | Status: DC | PRN

## 2014-12-20 MED ORDER — ESZOPICLONE 2 MG PO TABS: 2.0000 mg | ORAL_TABLET | Freq: Every evening | ORAL | Status: DC | PRN

## 2014-12-20 NOTE — Progress Notes (Signed)
Radiation Oncology         [phone]   Name: Courtney West   Date: 12/20/2014   MRN: [id number]  DOB: July 25, 1950    Multidisciplinary Brain and Spine Oncology Clinic Follow-Up Visit Note  CC: Shirline Frees, MD  Shirline Frees, MD  Diagnosis: Courtney West is a 64 year old woman presenting to clinic in regards to her metastatic lung cancer status post:  1. SRS to a 10-mm left frontal and 5-mm right frontal brain metastasis to 20 Gy on January 16, 2011.  2. The patient's primary lung cancer was treated to 66 Gy in 33 fractions of 2 Gy through March 10, 2011 . 3. SRS to 6 new brain metastases (2 adjacent left temporal brain metastases, a 4.7-mm vermis target, a 3.4-mm right frontal brain metastasis, a left parietal 5.3-mm brain metastasis, and a 6.6-mm left cerebellar brain metastasis) received 20 Gy in a single fraction.April 18, 2011. 4. SRS to a 4 mm posterior frontal target to 20 Gy on 10/20/2011 5. SBRT to a 1.6 cm metachronous clinical stage IA primary cancer of the right lower lung on 01/19/12, 01/21/12, and 01/26/12 to 54 gray in 3 fractions of 18 gray  Interval Since Last Radiation:  34  months  Narrative:  The patient returns today for routine follow-up.  The recent films were presented in our multidisciplinary conference with neuroradiology just prior to the clinic.  Kittel denies nausea, vomiting, dizziness, diplopia, pain, hemoptysis, or ringing in the ears. Her weight and vitals are stable. Reports an occasional productive cough and mild headaches managed well with tylenol. "Whenever I get headache's, they're not in one place. They're all over the place," the patient stated. These headaches do not occur daily or negatively effect her quality of life.   ALLERGIES:  is allergic to carboplatin; codeine; sulfa antibiotics; dilaudid; and penicillins.  Meds: Current Outpatient Prescriptions  Medication Sig Dispense Refill  . albuterol (PROVENTIL HFA;VENTOLIN HFA) 108 (90 BASE)  MCG/ACT inhaler Inhale 2 puffs into the lungs every 4 (four) hours as needed for wheezing or shortness of breath. 6.7 g 0  . eszopiclone (LUNESTA) 2 MG TABS tablet Take 1 tablet (2 mg total) by mouth at bedtime as needed for sleep. Take immediately before bedtime 30 tablet 5  . gabapentin (NEURONTIN) 300 MG capsule Take 300 mg by mouth daily.    Marland Kitchen ibuprofen (ADVIL,MOTRIN) 200 MG tablet Take 600 mg by mouth every 6 (six) hours as needed for headache.    . SYMBICORT 80-4.5 MCG/ACT inhaler INHALE TWO PUFFS INTO LUNGS AS NEEDED 2 Inhaler 5  . benzonatate (TESSALON) 100 MG capsule Take 1 capsule (100 mg total) by mouth every 8 (eight) hours. (Patient not taking: Reported on 12/20/2014) 21 capsule 0  . diclofenac sodium (VOLTAREN) 1 % GEL Apply 2 g topically 4 (four) times daily. (Patient not taking: Reported on 12/20/2014) 100 g 5   No current facility-administered medications for this encounter.    Physical Findings: The patient is in no acute distress. Patient is alert and oriented X3.  weight is 194 lb 12.8 oz (88.361 kg). Her blood pressure is 114/58 and her pulse is 73. Her respiration is 16. .  No significant changes.  Lab Findings: Lab Results  Component Value Date   WBC 11.2* 12/11/2014   HGB 16.4* 12/11/2014   HCT 48.9* 12/11/2014   MCV 92.8 12/11/2014   PLT 182 12/11/2014    '@LASTCHEM'$ @  Radiographic Findings: Ct Chest W Contrast  12/11/2014   CLINICAL DATA:  Subsequent encounter for lung cancer.  EXAM: CT CHEST, ABDOMEN, AND PELVIS WITH CONTRAST  TECHNIQUE: Multidetector CT imaging of the chest, abdomen and pelvis was performed following the standard protocol during bolus administration of intravenous contrast.  CONTRAST:  166m OMNIPAQUE IOHEXOL 300 MG/ML  SOLN  COMPARISON:  06/14/2014.  03/29/2012.  FINDINGS: CT CHEST FINDINGS  Mediastinum/Nodes: The tip of the right-sided Port-A-Cath is in the distal SVC. There is no axillary lymphadenopathy. 12 mm precarinal lymph node measured  previously is 16 mm on the current study. 11 mm upper right hilar lymph node measured previously is now 12 mm 13 mm short axis inferior right hilar lymph node (image 22 series 3) was 7 mm previously. No subcarinal lymphadenopathy. Changes radiation fibrosis are again seen in the right hilum.  Heart size is stable.  Moderate pericardial effusion is unchanged.  Lungs/Pleura: Moderate to advanced emphysema again noted bilaterally. 5 mm posterior right upper lobe nodule (image 15 series 5) is stable. Soft tissue attenuation is identified in the central right lower lobe, as before likely reflects clips radiation change. However, the lateral and posterior aspect of this opacities become more pronounced in confluent in the interval. This is well demonstrated on coronal reformations (see image 87 series 602 today and compare to image 77 of series 602 previously). Also compare axial image 32 of series 5 today to image 34 of series 5 previously.  Musculoskeletal: Bone windows reveal no worrisome lytic or sclerotic osseous lesions.  CT ABDOMEN AND PELVIS FINDINGS  Hepatobiliary: No focal abnormality within the liver parenchyma. Gallbladder is surgically absent. No intrahepatic or extrahepatic biliary dilation.  Pancreas: Stable 9-10 mm cystic lesion the pancreatic body when comparing back to the 2013 exam. This is compatible with a benign process. No dilatation of the main duct. No enhancing pancreatic mass.  Spleen: Stable in appearance with some capsular calcification laterally.  Adrenals/Urinary Tract: Stable appearance of bilateral adrenal thickening without nodularity. No and he seen lesion in either kidney. No hydronephrosis. No evidence for hydroureter. Urinary bladder is unremarkable.  Stomach/Bowel: Stomach is nondistended. No gastric wall thickening. No evidence of outlet obstruction. Duodenum is normally positioned as is the ligament of Treitz. There is a tiny duodenum diverticulum noted. No small bowel wall  thickening. No small bowel dilatation. Terminal ileum is normal. Appendix is normal. No gross colonic mass. No colonic wall thickening. No substantial diverticular change.  Vascular/Lymphatic: Fusiform infrarenal abdominal aortic aneurysm measures 5.6 x 5.2 cm today compared to 5.4 x 5.0 cm previously.  Note gastrohepatic or hepatoduodenal ligament lymphadenopathy. No para-aortic lymphadenopathy. No pelvic sidewall lymphadenopathy.  Reproductive: Uterus is surgically absent.  No adnexal mass.  Other: No intraperitoneal free fluid.  Musculoskeletal: Bone windows reveal no worrisome lytic or sclerotic osseous lesions. Stable compression deformity of the L2 vertebral body.  IMPRESSION: 1. Interval progression of soft tissue associated with probable post radiation scarring in the central right lower lobe. This is associated with an enlarging inferior right hilar lymph node. Together, imaging features are concerning for recurrent disease. 2. Stable 5 mm right upper lobe posterior pulmonary nodule. 3. Slight interval progression of abdominal aortic aneurysm, now measuring 5.6 cm in diameter. 4. Stable moderate pericardial effusion. 5. Postradiation changes in the left parahilar lung are stable.   Electronically Signed   By: EMisty StanleyM.D.   On: 12/11/2014 10:56   Mr Courtney West  12/18/2014   CLINICAL DATA:  64year old hypertensive female with history of lung cancer post stereotactic radiation surgery.  Forty-seven month restaging. Subsequent encounter.  EXAM: MRI HEAD WITHOUT AND WITH CONTRAST  TECHNIQUE: Multiplanar, multiecho pulse sequences of the brain and surrounding structures were obtained without and with intravenous contrast.  CONTRAST:  90m MULTIHANCE GADOBENATE DIMEGLUMINE 529 MG/ML IV SOLN  COMPARISON:  Most recent examination 06/22/2014.  FINDINGS: Previously described 5 intracranial sub cm enhancing lesions consistent with metastatic disease without significant change. As seen on series 10,  posterior left frontal lobe (image 107), 2 lesions within the right frontal lobe (image 89), peripheral aspect of the left temporal lobe (image 49) and left cerebellum (image 38).  Question new 2 x 3 x 1 mm enhancing lesion superior vermis (series 10, image 59 and series 12, image 20) versus small vessel.  No acute infarct.  No intracranial hemorrhage.  Scattered T2 areas of altered signal intensity without significant change from prior exam and may reflect changes of prior tumor, treated tumor, residual tumor and/or small vessel disease type changes. Encephalomalacia most notable right cerebellum.  No hydrocephalus.  Major intracranial vascular structures are patent.  Progressive prominent mucosal thickening right maxillary sinus measuring up to 1.5 cm with slight complexity. No intraorbital extension identified. Minimal mucosal thickening right frontal sinus mild mucosal thickening ethmoid sinus air cells bilaterally.  No primary worrisome orbital lesion/abnormality.  Cervical medullary junction and pineal region unremarkable. Slightly partially empty sella without change.  IMPRESSION: Previously described 5 intracranial sub cm enhancing lesions consistent with metastatic disease without significant change. As seen on series 10, posterior left frontal lobe (image 107), 2 lesions within the right frontal lobe (image 89), peripheral aspect of the left temporal lobe (image 49) and left cerebellum (image 38).  Question new 2 x 3 x 1 mm enhancing lesion superior vermis (series 10, image 59 and series 12, image 20) versus small vessel.  Progressive prominent mucosal thickening right maxillary sinus measuring up to 1.5 cm.  Please see above.   Electronically Signed   By: Courtney DelM.D.   On: 12/18/2014 13:07   Ct Abdomen Pelvis W Contrast  12/11/2014   CLINICAL DATA:  Subsequent encounter for lung cancer.  EXAM: CT CHEST, ABDOMEN, AND PELVIS WITH CONTRAST  TECHNIQUE: Multidetector CT imaging of the chest, abdomen  and pelvis was performed following the standard protocol during bolus administration of intravenous contrast.  CONTRAST:  1058mOMNIPAQUE IOHEXOL 300 MG/ML  SOLN  COMPARISON:  06/14/2014.  03/29/2012.  FINDINGS: CT CHEST FINDINGS  Mediastinum/Nodes: The tip of the right-sided Port-A-Cath is in the distal SVC. There is no axillary lymphadenopathy. 12 mm precarinal lymph node measured previously is 16 mm on the current study. 11 mm upper right hilar lymph node measured previously is now 12 mm 13 mm short axis inferior right hilar lymph node (image 22 series 3) was 7 mm previously. No subcarinal lymphadenopathy. Changes radiation fibrosis are again seen in the right hilum.  Heart size is stable.  Moderate pericardial effusion is unchanged.  Lungs/Pleura: Moderate to advanced emphysema again noted bilaterally. 5 mm posterior right upper lobe nodule (image 15 series 5) is stable. Soft tissue attenuation is identified in the central right lower lobe, as before likely reflects clips radiation change. However, the lateral and posterior aspect of this opacities become more pronounced in confluent in the interval. This is well demonstrated on coronal reformations (see image 87 series 602 today and compare to image 77 of series 602 previously). Also compare axial image 32 of series 5 today to image 34 of series 5 previously.  Musculoskeletal:  Bone windows reveal no worrisome lytic or sclerotic osseous lesions.  CT ABDOMEN AND PELVIS FINDINGS  Hepatobiliary: No focal abnormality within the liver parenchyma. Gallbladder is surgically absent. No intrahepatic or extrahepatic biliary dilation.  Pancreas: Stable 9-10 mm cystic lesion the pancreatic body when comparing back to the 2013 exam. This is compatible with a benign process. No dilatation of the main duct. No enhancing pancreatic mass.  Spleen: Stable in appearance with some capsular calcification laterally.  Adrenals/Urinary Tract: Stable appearance of bilateral adrenal  thickening without nodularity. No and he seen lesion in either kidney. No hydronephrosis. No evidence for hydroureter. Urinary bladder is unremarkable.  Stomach/Bowel: Stomach is nondistended. No gastric wall thickening. No evidence of outlet obstruction. Duodenum is normally positioned as is the ligament of Treitz. There is a tiny duodenum diverticulum noted. No small bowel wall thickening. No small bowel dilatation. Terminal ileum is normal. Appendix is normal. No gross colonic mass. No colonic wall thickening. No substantial diverticular change.  Vascular/Lymphatic: Fusiform infrarenal abdominal aortic aneurysm measures 5.6 x 5.2 cm today compared to 5.4 x 5.0 cm previously.  Note gastrohepatic or hepatoduodenal ligament lymphadenopathy. No para-aortic lymphadenopathy. No pelvic sidewall lymphadenopathy.  Reproductive: Uterus is surgically absent.  No adnexal mass.  Other: No intraperitoneal free fluid.  Musculoskeletal: Bone windows reveal no worrisome lytic or sclerotic osseous lesions. Stable compression deformity of the L2 vertebral body.  IMPRESSION: 1. Interval progression of soft tissue associated with probable post radiation scarring in the central right lower lobe. This is associated with an enlarging inferior right hilar lymph node. Together, imaging features are concerning for recurrent disease. 2. Stable 5 mm right upper lobe posterior pulmonary nodule. 3. Slight interval progression of abdominal aortic aneurysm, now measuring 5.6 cm in diameter. 4. Stable moderate pericardial effusion. 5. Postradiation changes in the left parahilar lung are stable.   Electronically Signed   By: Courtney West M.D.   On: 12/11/2014 10:56    Impression: Courtney West is a 64 year old woman presenting to clinic in regards to her metastatic lung cancer and has no evidence of active intracranial metastases at this time. Although untreated, several of her brain metastases remain stable.  Plan:  The patient was advised of  new results discovered on most recent MRI. The patient expressed that she strongly does not wish to take on additional treatments at this time. Possible treatment options and symptoms to be expected via these possible treatment options were discussed. Follow-up six months  with radiation oncology, but, no further brain MRI unless symptoms..  This document serves as a record of services personally performed by Tyler Pita, MD. It was created on his behalf by Lenn Cal, a trained medical scribe. The creation of this record is based on the scribe's personal observations and the provider's statements to them. This document has been checked and approved by the attending provider.  _____________________________________  Sheral Apley. Tammi Klippel, M.D.

## 2014-12-20 NOTE — Progress Notes (Signed)
Weight and vitals stable. Denies pain. Reports an occasional productive cough. Denies hemoptysis. Denies nausea, vomiting, dizziness, diplopia, or ringing in the ears. Reports mild headaches managed well with tylenol.   BP 114/58 mmHg  Pulse 73  Resp 16  Wt 194 lb 12.8 oz (88.361 kg) Wt Readings from Last 3 Encounters:  12/20/14 194 lb 12.8 oz (88.361 kg)  12/18/14 194 lb 4.8 oz (88.134 kg)  12/18/14 197 lb (89.359 kg)

## 2014-12-22 ENCOUNTER — Telehealth: Payer: Self-pay | Admitting: Radiation Oncology

## 2014-12-22 NOTE — Telephone Encounter (Signed)
[place] was without power yesterday. Thus, refills of albuteral and lunesta had to be called in today. Per Dr. Johny Shears order called in Lunesta and Albuterol. Phoned patient making her aware this was done.

## 2014-12-27 ENCOUNTER — Telehealth: Payer: Self-pay | Admitting: Radiation Oncology

## 2014-12-27 NOTE — Telephone Encounter (Signed)
Phoned patient informing her prior authorization was completed by this RN today electronically for Proventil inhaler. Explained [employer] could take 48-72 hours to respond. Patient verbalized understanding.

## 2014-12-27 NOTE — Telephone Encounter (Signed)
Received call from patient that the pharmacy couldn't fill her script. Phoned pharmacy. Spoke with Tamika. She reports her insurance will not cover Proventil nor ProAir. Tamika to fax prior authorization form to [phone].

## 2015-01-03 ENCOUNTER — Telehealth: Payer: Self-pay | Admitting: Radiation Oncology

## 2015-01-03 NOTE — Telephone Encounter (Signed)
Completed prior authorization for Proventil but, was denied. [employer] reports that Ventolin is on their formulary. Phoned pharmacist, Josephina Gip. She confirms ventolin and proventil are the exact same drug just different manufacturers. Josephina Gip reports she will re-enter the script as ventolin. Phoned patient making her aware this was done. She verbalized understanding and expressed appreciation for all the hard work.

## 2015-02-06 ENCOUNTER — Other Ambulatory Visit: Payer: Self-pay | Admitting: Radiation Oncology

## 2015-02-07 ENCOUNTER — Other Ambulatory Visit: Payer: Self-pay | Admitting: Radiation Oncology

## 2015-02-07 ENCOUNTER — Telehealth: Payer: Self-pay | Admitting: Radiation Oncology

## 2015-02-07 DIAGNOSIS — C7931 Secondary malignant neoplasm of brain: Secondary | ICD-10-CM

## 2015-02-07 DIAGNOSIS — C3491 Malignant neoplasm of unspecified part of right bronchus or lung: Secondary | ICD-10-CM

## 2015-02-07 DIAGNOSIS — C7949 Secondary malignant neoplasm of other parts of nervous system: Secondary | ICD-10-CM

## 2015-02-07 MED ORDER — GABAPENTIN 300 MG PO CAPS: 300.0000 mg | ORAL_CAPSULE | Freq: Every day | ORAL | Status: DC

## 2015-02-07 NOTE — Telephone Encounter (Signed)
Left message for patient that gabapentin refill was Escribed to [facility], [place].

## 2015-02-22 ENCOUNTER — Telehealth: Payer: Self-pay | Admitting: Internal Medicine

## 2015-02-22 NOTE — Telephone Encounter (Signed)
Faxed pt medical records to [facility] [phone]

## 2015-03-23 ENCOUNTER — Telehealth: Payer: Self-pay | Admitting: Radiation Oncology

## 2015-03-23 NOTE — Telephone Encounter (Signed)
Patient left message requesting return call. No answer. Left message.

## 2015-03-25 ENCOUNTER — Inpatient Hospital Stay (HOSPITAL_COMMUNITY)
Admission: EM | Admit: 2015-03-25 | Discharge: 2015-03-28 | DRG: 190 | Disposition: A | Discharge status: Home Health | Payer: Medicare Other | Attending: Family Medicine | Admitting: Family Medicine

## 2015-03-25 ENCOUNTER — Other Ambulatory Visit: Payer: Self-pay | Admitting: Radiation Oncology

## 2015-03-25 ENCOUNTER — Encounter (HOSPITAL_COMMUNITY): Payer: Self-pay | Admitting: Nurse Practitioner

## 2015-03-25 DIAGNOSIS — C7931 Secondary malignant neoplasm of brain: Secondary | ICD-10-CM

## 2015-03-25 DIAGNOSIS — C3491 Malignant neoplasm of unspecified part of right bronchus or lung: Secondary | ICD-10-CM | POA: Diagnosis present

## 2015-03-25 DIAGNOSIS — M5136 Other intervertebral disc degeneration, lumbar region: Secondary | ICD-10-CM | POA: Diagnosis present

## 2015-03-25 DIAGNOSIS — Z85841 Personal history of malignant neoplasm of brain: Secondary | ICD-10-CM

## 2015-03-25 DIAGNOSIS — F17210 Nicotine dependence, cigarettes, uncomplicated: Secondary | ICD-10-CM | POA: Diagnosis present

## 2015-03-25 DIAGNOSIS — J90 Pleural effusion, not elsewhere classified: Secondary | ICD-10-CM | POA: Diagnosis present

## 2015-03-25 DIAGNOSIS — Z880 Allergy status to penicillin: Secondary | ICD-10-CM

## 2015-03-25 DIAGNOSIS — Z8051 Family history of malignant neoplasm of kidney: Secondary | ICD-10-CM

## 2015-03-25 DIAGNOSIS — J441 Chronic obstructive pulmonary disease with (acute) exacerbation: Secondary | ICD-10-CM | POA: Diagnosis not present

## 2015-03-25 DIAGNOSIS — C7949 Secondary malignant neoplasm of other parts of nervous system: Principal | ICD-10-CM

## 2015-03-25 DIAGNOSIS — E785 Hyperlipidemia, unspecified: Secondary | ICD-10-CM | POA: Diagnosis present

## 2015-03-25 DIAGNOSIS — Z923 Personal history of irradiation: Secondary | ICD-10-CM

## 2015-03-25 DIAGNOSIS — Z885 Allergy status to narcotic agent status: Secondary | ICD-10-CM

## 2015-03-25 DIAGNOSIS — J449 Chronic obstructive pulmonary disease, unspecified: Secondary | ICD-10-CM | POA: Diagnosis present

## 2015-03-25 DIAGNOSIS — I714 Abdominal aortic aneurysm, without rupture: Secondary | ICD-10-CM | POA: Diagnosis present

## 2015-03-25 DIAGNOSIS — Z79899 Other long term (current) drug therapy: Secondary | ICD-10-CM

## 2015-03-25 DIAGNOSIS — D72829 Elevated white blood cell count, unspecified: Secondary | ICD-10-CM | POA: Diagnosis present

## 2015-03-25 DIAGNOSIS — Z6832 Body mass index (BMI) 32.0-32.9, adult: Secondary | ICD-10-CM

## 2015-03-25 DIAGNOSIS — Z9221 Personal history of antineoplastic chemotherapy: Secondary | ICD-10-CM

## 2015-03-25 DIAGNOSIS — I10 Essential (primary) hypertension: Secondary | ICD-10-CM | POA: Diagnosis present

## 2015-03-25 DIAGNOSIS — Z8673 Personal history of transient ischemic attack (TIA), and cerebral infarction without residual deficits: Secondary | ICD-10-CM

## 2015-03-25 DIAGNOSIS — R0602 Shortness of breath: Secondary | ICD-10-CM | POA: Diagnosis not present

## 2015-03-25 DIAGNOSIS — Z806 Family history of leukemia: Secondary | ICD-10-CM

## 2015-03-25 DIAGNOSIS — Z791 Long term (current) use of non-steroidal anti-inflammatories (NSAID): Secondary | ICD-10-CM

## 2015-03-25 DIAGNOSIS — I513 Intracardiac thrombosis, not elsewhere classified: Secondary | ICD-10-CM | POA: Diagnosis present

## 2015-03-25 DIAGNOSIS — E440 Moderate protein-calorie malnutrition: Secondary | ICD-10-CM | POA: Diagnosis present

## 2015-03-25 DIAGNOSIS — Z882 Allergy status to sulfonamides status: Secondary | ICD-10-CM

## 2015-03-25 DIAGNOSIS — Z23 Encounter for immunization: Secondary | ICD-10-CM

## 2015-03-25 DIAGNOSIS — J418 Mixed simple and mucopurulent chronic bronchitis: Secondary | ICD-10-CM

## 2015-03-25 DIAGNOSIS — J189 Pneumonia, unspecified organism: Secondary | ICD-10-CM | POA: Diagnosis present

## 2015-03-25 DIAGNOSIS — M549 Dorsalgia, unspecified: Secondary | ICD-10-CM | POA: Diagnosis present

## 2015-03-25 DIAGNOSIS — Z888 Allergy status to other drugs, medicaments and biological substances status: Secondary | ICD-10-CM

## 2015-03-25 LAB — I-STAT CHEM 8, ED
BUN: 19 mg/dL (ref 6–20)
CALCIUM ION: 1.17 mmol/L (ref 1.13–1.30)
Chloride: 106 mmol/L (ref 101–111)
Creatinine, Ser: 0.8 mg/dL (ref 0.44–1.00)
Glucose, Bld: 102 mg/dL — ABNORMAL HIGH (ref 65–99)
HCT: 51 % — ABNORMAL HIGH (ref 36.0–46.0)
Hemoglobin: 17.3 g/dL — ABNORMAL HIGH (ref 12.0–15.0)
Potassium: 3.8 mmol/L (ref 3.5–5.1)
SODIUM: 144 mmol/L (ref 135–145)
TCO2: 24 mmol/L (ref 0–100)

## 2015-03-25 LAB — CBC WITH DIFFERENTIAL/PLATELET
Basophils Absolute: 0.1 10*3/uL (ref 0.0–0.1)
Basophils Relative: 1 %
EOS ABS: 0.2 10*3/uL (ref 0.0–0.7)
Eosinophils Relative: 2 %
HCT: 47.2 % — ABNORMAL HIGH (ref 36.0–46.0)
Hemoglobin: 16 g/dL — ABNORMAL HIGH (ref 12.0–15.0)
LYMPHS ABS: 3.6 10*3/uL (ref 0.7–4.0)
LYMPHS PCT: 29 %
MCH: 32 pg (ref 26.0–34.0)
MCHC: 33.9 g/dL (ref 30.0–36.0)
MCV: 94.4 fL (ref 78.0–100.0)
MONO ABS: 0.6 10*3/uL (ref 0.1–1.0)
Monocytes Relative: 5 %
Neutro Abs: 7.8 10*3/uL — ABNORMAL HIGH (ref 1.7–7.7)
Neutrophils Relative %: 63 %
PLATELETS: 198 10*3/uL (ref 150–400)
RBC: 5 MIL/uL (ref 3.87–5.11)
RDW: 12.9 % (ref 11.5–15.5)
WBC: 12.3 10*3/uL — ABNORMAL HIGH (ref 4.0–10.5)

## 2015-03-25 MED ORDER — ALBUTEROL SULFATE (2.5 MG/3ML) 0.083% IN NEBU
5.0000 mg | INHALATION_SOLUTION | Freq: Once | RESPIRATORY_TRACT | Status: AC
  Administered 2015-03-25: 5 mg via RESPIRATORY_TRACT
  Filled 2015-03-25: qty 6

## 2015-03-25 MED ORDER — IPRATROPIUM BROMIDE 0.02 % IN SOLN
0.5000 mg | Freq: Once | RESPIRATORY_TRACT | Status: AC
  Administered 2015-03-25: 0.5 mg via RESPIRATORY_TRACT
  Filled 2015-03-25: qty 2.5

## 2015-03-25 MED ORDER — GABAPENTIN 300 MG PO CAPS: 600.0000 mg | ORAL_CAPSULE | Freq: Three times a day (TID) | ORAL | Status: DC

## 2015-03-25 MED ORDER — KETOROLAC TROMETHAMINE 30 MG/ML IJ SOLN
30.0000 mg | Freq: Once | INTRAMUSCULAR | Status: AC
  Administered 2015-03-25: 30 mg via INTRAVENOUS
  Filled 2015-03-25: qty 1

## 2015-03-25 MED ORDER — IPRATROPIUM BROMIDE 0.02 % IN SOLN
RESPIRATORY_TRACT | Status: AC
  Filled 2015-03-25: qty 2.5

## 2015-03-25 NOTE — ED Notes (Signed)
Pt went to restroom a aprox. 10 minutes ago did not get a sample.

## 2015-03-25 NOTE — ED Provider Notes (Addendum)
CSN: [id number]   Arrival date & time 03/25/15 2157  History  By signing my name below, I, Altamease Oiler, attest that this documentation has been prepared under the direction and in the presence of Allysha Tryon, MD. Electronically Signed: Altamease Oiler, ED Scribe. 03/26/2015. 12:49 AM.  Chief Complaint  Patient presents with  . Flank/Bank Pain    . CA Pt     HPI Patient is a 65 y.o. female presenting with back pain. The history is provided by the patient. No language interpreter was used.  Back Pain Location:  Thoracic spine Quality:  Burning Radiates to:  Does not radiate Pain severity:  Severe Onset quality:  Gradual Duration:  5 hours Timing:  Constant Progression:  Worsening Chronicity:  New Context: not falling, not jumping from heights, not lifting heavy objects, not MCA, not MVA, not occupational injury, not pedestrian accident, not physical stress, not recent injury and not twisting   Relieved by:  Nothing Worsened by:  Nothing tried Ineffective treatments:  None tried Associated symptoms: no abdominal pain, no abdominal swelling, no bladder incontinence, no bowel incontinence, no chest pain, no dysuria, no fever, no headaches, no leg pain, no numbness, no paresthesias, no pelvic pain, no perianal numbness, no tingling and no weakness    Courtney West is a 64 y.o. female with history of lung cancer with metastasis to the brain s/p radiation and  COPD who presents to the Emergency Department complaining of increasing and atraumatic right mid back pain with onset around 6:30 PM. Pt describes the pain as burning and rates it 10/10 in severity with no modifying factors. She took nothing for pain PTA. She states that she has not had radiation therapy at the right lung, only on the left. Pt denies increased cough.   Past Medical History  Diagnosis Date  . Hypertension   . COPD (chronic obstructive pulmonary disease) ([place])   . Stroke ([place])   . Abdominal aortic aneurysm ([place])    . Dyslipidemia   . S/P radiation therapy 7/12 thru 9/12, 04/18/11    xrt to brain mets  . Lung cancer ([place]) 12/2010  . Brain cancer ([place])     mets from lung primary  . Allergy     codeine,pcn,sulfa drugs  . Radiation 10/20/2011    frontal mets/Palliation  . History of radiation therapy 01/19/12, 01/21/12, 01/26/12    RLL lung  . History of radiation therapy 04/18/11    single fraction to 6 brain mets  . Clotted vascular catheter ([place]) 03/30/2012  . Lung cancer ([place]) 12/22/2010    Past Surgical History  Procedure Laterality Date  . Carpal tunnel release    . Left knee surgery    . Cholecystectomy    . Partial hysterectomy      Family History  Problem Relation Age of Onset  . Cancer Father     lung, leukemia  . Cancer Maternal Aunt     kidney    Social History  Substance Use Topics  . Smoking status: Current Every Day Smoker -- 1.00 packs/day for 50 years    Types: Cigarettes  . Smokeless tobacco: None  . Alcohol Use: No     Review of Systems  Constitutional: Negative for fever and chills.  Cardiovascular: Negative for chest pain.  Gastrointestinal: Negative for nausea, vomiting, abdominal pain and bowel incontinence.  Genitourinary: Negative for bladder incontinence, dysuria and pelvic pain.  Musculoskeletal: Positive for back pain.  Neurological: Negative for tingling, weakness, numbness, headaches and paresthesias.  All other systems reviewed and are negative.  Home Medications   Prior to Admission medications   Medication Sig Start Date End Date Taking? Authorizing Provider  albuterol (PROVENTIL HFA;VENTOLIN HFA) 108 (90 BASE) MCG/ACT inhaler Inhale 2 puffs into the lungs every 4 (four) hours as needed for wheezing or shortness of breath. 12/20/14  Yes Tyler Pita, MD  eszopiclone (LUNESTA) 2 MG TABS tablet Take 1 tablet (2 mg total) by mouth at bedtime as needed for sleep. Take immediately before bedtime 12/20/14  Yes Tyler Pita, MD  gabapentin (NEURONTIN)  300 MG capsule Take 1 capsule (300 mg total) by mouth daily. Patient taking differently: Take 600 mg by mouth 2 (two) times daily.  02/07/15  Yes Tyler Pita, MD  SYMBICORT 80-4.5 MCG/ACT inhaler INHALE TWO PUFFS INTO LUNGS AS NEEDED Patient taking differently: INHALE TWO PUFFS INTO LUNGS AS NEEDED for shortness of breath 06/30/14  Yes Tyler Pita, MD  benzonatate (TESSALON) 100 MG capsule Take 1 capsule (100 mg total) by mouth every 8 (eight) hours. Patient not taking: Reported on 12/20/2014 06/04/14   Veryl Speak, MD  diclofenac sodium (VOLTAREN) 1 % GEL Apply 2 g topically 4 (four) times daily. Patient not taking: Reported on 12/20/2014 11/23/14   Tyler Pita, MD  gabapentin (NEURONTIN) 300 MG capsule Take 2 capsules (600 mg total) by mouth 3 (three) times daily. Patient not taking: Reported on 03/25/2015 03/25/15   Tyler Pita, MD  ibuprofen (ADVIL,MOTRIN) 200 MG tablet Take 600 mg by mouth every 6 (six) hours as needed for headache.    Historical Provider, MD    Allergies  Carboplatin; Codeine; Sulfa antibiotics; Dilaudid; and Penicillins  Triage Vitals: BP 119/73 mmHg  Pulse 85  Temp(Src) 98.4 F (36.9 C) (Oral)  Resp 18  Ht '5\' 3"'$  (1.6 m)  Wt 195 lb (88.451 kg)  BMI 34.55 kg/m2  SpO2 95%  Physical Exam  Constitutional: She is oriented to person, place, and time. She appears well-developed and well-nourished. No distress.  HENT:  Head: Normocephalic and atraumatic.  Moist mucous membranes  Eyes: Pupils are equal, round, and reactive to light.  Neck: Normal range of motion.  No bruit Trachea midline  Cardiovascular: Normal rate and regular rhythm.   Pulses:      Dorsalis pedis pulses are 2+ on the right side, and 2+ on the left side.  Pulmonary/Chest: Effort normal. No stridor. No respiratory distress. She has no wheezes. She has no rhonchi. She has no rales.  Breath sounds equal and symetric  Abdominal: Soft. Bowel sounds are normal. She exhibits no distension.  There is no tenderness. There is no rebound and no guarding.  Musculoskeletal: Normal range of motion. She exhibits no edema or tenderness.  No step off, crepitus or point tenderness of the spine  Neurological: She is alert and oriented to person, place, and time.  Skin: Skin is warm and dry. She is not diaphoretic.  Psychiatric: She has a normal mood and affect.  Nursing note and vitals reviewed.   ED Course  Procedures   DIAGNOSTIC STUDIES: Oxygen Saturation is 95% on RA, normal by my interpretation.    COORDINATION OF CARE: 11:10 PM Discussed treatment plan which includes lab work, CT angio chest. Toradol, and a breathing treatment with pt at bedside and pt agreed to plan.  Labs Reviewed  CBC WITH DIFFERENTIAL/PLATELET - Abnormal; Notable for the following:    WBC 12.3 (*)    Hemoglobin 16.0 (*)    HCT 47.2 (*)  Neutro Abs 7.8 (*)    All other components within normal limits  I-STAT CHEM 8, ED - Abnormal; Notable for the following:    Glucose, Bld 102 (*)    Hemoglobin 17.3 (*)    HCT 51.0 (*)    All other components within normal limits  URINALYSIS, ROUTINE W REFLEX MICROSCOPIC (NOT AT [facility])    Imaging Review No results found.  I personally reviewed and evaluated these images and lab results as a part of my medical decision-making.  EKG Interpretation  Date/Time:  Monday March 26 2015 00:42:55 EDT Ventricular Rate:  78 PR Interval:  203 QRS Duration: 103 QT Interval:  402 QTC Calculation: 458 R Axis:   -20 Text Interpretation:  Sinus rhythm Ventricular premature complex Low voltage, extremity and precordial leads Consider inferior infarct, old Confirmed by [facility]  MD, Raphaella Larkin ([id number]) on 03/26/2015 12:48:02 AM  MDM   Final diagnoses:  None   Results for orders placed or performed during the hospital encounter of 03/25/15  CBC with Differential/Platelet  Result Value Ref Range   WBC 12.3 (H) 4.0 - 10.5 K/uL   RBC 5.00 3.87 - 5.11 MIL/uL    Hemoglobin 16.0 (H) 12.0 - 15.0 g/dL   HCT 47.2 (H) 36.0 - 46.0 %   MCV 94.4 78.0 - 100.0 fL   MCH 32.0 26.0 - 34.0 pg   MCHC 33.9 30.0 - 36.0 g/dL   RDW 12.9 11.5 - 15.5 %   Platelets 198 150 - 400 K/uL   Neutrophils Relative % 63 %   Neutro Abs 7.8 (H) 1.7 - 7.7 K/uL   Lymphocytes Relative 29 %   Lymphs Abs 3.6 0.7 - 4.0 K/uL   Monocytes Relative 5 %   Monocytes Absolute 0.6 0.1 - 1.0 K/uL   Eosinophils Relative 2 %   Eosinophils Absolute 0.2 0.0 - 0.7 K/uL   Basophils Relative 1 %   Basophils Absolute 0.1 0.0 - 0.1 K/uL  I-stat chem 8, ed  Result Value Ref Range   Sodium 144 135 - 145 mmol/L   Potassium 3.8 3.5 - 5.1 mmol/L   Chloride 106 101 - 111 mmol/L   BUN 19 6 - 20 mg/dL   Creatinine, Ser 0.80 0.44 - 1.00 mg/dL   Glucose, Bld 102 (H) 65 - 99 mg/dL   Calcium, Ion 1.17 1.13 - 1.30 mmol/L   TCO2 24 0 - 100 mmol/L   Hemoglobin 17.3 (H) 12.0 - 15.0 g/dL   HCT 51.0 (H) 36.0 - 46.0 %  I-stat troponin, ED  Result Value Ref Range   Troponin i, poc 0.00 0.00 - 0.08 ng/mL   Comment 3           Ct Angio Chest Pe W/cm &/or Wo Cm  03/26/2015   CLINICAL DATA:  Acute onset of right flank pain and chronic cough. Decreased O2 saturation. Leukocytosis. Known metastatic lung cancer to the brain. Initial encounter.  EXAM: CT ANGIOGRAPHY CHEST WITH CONTRAST  TECHNIQUE: Multidetector CT imaging of the chest was performed using the standard protocol during bolus administration of intravenous contrast. Multiplanar CT image reconstructions and MIPs were obtained to evaluate the vascular anatomy.  CONTRAST:  100 mL of Omnipaque 350 IV contrast  COMPARISON:  CT of the chest performed 12/11/2014  FINDINGS: There is no evidence of pulmonary embolus.  Focal right basilar airspace opacity is similar in appearance to the prior study and may simply reflect postradiation change, though recurrent disease again cannot be excluded. PET/CT could be considered  for further evaluation. Airspace opacification at  the left hilum likely reflects postradiation change. Underlying emphysematous change is noted bilaterally, more prominent at the upper lung lobes. There is no evidence of pleural effusion or pneumothorax. No masses are identified; no abnormal focal contrast enhancement is seen.  Scattered mildly prominent right hilar and azygoesophageal recess nodes are seen, measuring up to 1.2 cm in short axis. A moderate pericardial effusion is noted. Scattered calcification is noted along the aortic arch, with mild mural thrombus at the right innominate artery. The great vessels are otherwise grossly unremarkable. No axillary lymphadenopathy is seen. The thyroid gland is unremarkable in appearance. A right-sided chest port is noted.  The visualized portions of the liver and spleen are unremarkable, aside from mild scarring and calcification along the periphery of the spleen.  No acute osseous abnormalities are seen.  Review of the MIP images confirms the above findings.  IMPRESSION: 1. No evidence of pulmonary embolus. 2. Focal right basilar airspace opacity is similar in appearance to the prior study and may simply reflect postradiation change, though worsened from 2015. Recurrent disease again cannot be excluded. PET/CT would be helpful for further evaluation, as deemed clinically appropriate. 3. Airspace opacification of the left hilum likely reflects postradiation change. 4. Mildly prominent right hilar and azygoesophageal recess nodes noted, similar and nonspecific in appearance. 5. Moderate pericardial effusion again noted. 6. Mild mural thrombus again noted at the right innominate artery.   Electronically Signed   By: Garald Balding M.D.   On: 03/26/2015 01:31    Medications  albuterol (PROVENTIL) (2.5 MG/3ML) 0.083% nebulizer solution 5 mg (5 mg Nebulization Not Given 03/26/15 0339)  levofloxacin (LEVAQUIN) IVPB 500 mg (500 mg Intravenous New Bag/Given 03/26/15 0336)  ketorolac (TORADOL) 30 MG/ML injection 30 mg (30  mg Intravenous Given 03/25/15 2344)  albuterol (PROVENTIL) (2.5 MG/3ML) 0.083% nebulizer solution 5 mg (5 mg Nebulization Given 03/25/15 2345)  ipratropium (ATROVENT) nebulizer solution 0.5 mg (0.5 mg Nebulization Given 03/25/15 2345)  ipratropium (ATROVENT) 0.02 % nebulizer solution (  Duplicate 33/2/95 1884)  iohexol (OMNIPAQUE) 350 MG/ML injection 100 mL (100 mLs Intravenous Contrast Given 03/26/15 0057)  methylPREDNISolone sodium succinate (SOLU-MEDROL) 125 mg/2 mL injection 125 mg (125 mg Intravenous Given 03/26/15 0335)  albuterol (PROVENTIL,VENTOLIN) solution continuous neb (10 mg/hr Nebulization Given 03/26/15 0355)   MDM Reviewed: previous chart, nursing note and vitals Reviewed previous: labs Interpretation: labs, ECG and CT scan (elevation of white count,  area of concern in the RLL on CT, ) Total time providing critical care: 30-74 minutes. This excludes time spent performing separately reportable procedures and services. Consults: admitting MD  CRITICAL CARE Performed by: Carlisle Beers Total critical care time: 61 minutes Critical care time was exclusive of separately billable procedures and treating other patients. Critical care was necessary to treat or prevent imminent or life-threatening deterioration. Critical care was time spent personally by me on the following activities: development of treatment plan with patient and/or surrogate as well as nursing, discussions with consultants, evaluation of patient's response to treatment, examination of patient, obtaining history from patient or surrogate, ordering and performing treatments and interventions, ordering and review of laboratory studies, ordering and review of radiographic studies, pulse oximetry and re-evaluation of patient's condition.   I, Malcolm Quast-RASCH,Jacci Ruberg K, personally performed the services described in this documentation. All medical record entries made by the scribe were at my direction and in my presence.  I  have reviewed the chart and discharge instructions and agree that the record reflects  my personal performance and is accurate and complete. Jodi Criscuolo-RASCH,Morrill Bomkamp K.  03/26/2015. 4:34 AM.      Boni Maclellan, MD 03/26/15 0434  Sherrel Shafer, MD 03/26/15 [phone]

## 2015-03-25 NOTE — ED Notes (Signed)
Pt presents with flank/back pain 10/10 which she localizes posterior/lateral aspect of her right side, adds that it feels like it is burning and it "is in the lungs." Reports hx of lung cancer with mets to the brain.

## 2015-03-26 ENCOUNTER — Encounter (HOSPITAL_COMMUNITY): Payer: Self-pay | Admitting: Radiology

## 2015-03-26 ENCOUNTER — Emergency Department (HOSPITAL_COMMUNITY): Payer: Medicare Other

## 2015-03-26 DIAGNOSIS — M5136 Other intervertebral disc degeneration, lumbar region: Secondary | ICD-10-CM | POA: Diagnosis present

## 2015-03-26 DIAGNOSIS — D72829 Elevated white blood cell count, unspecified: Secondary | ICD-10-CM

## 2015-03-26 DIAGNOSIS — R0602 Shortness of breath: Secondary | ICD-10-CM | POA: Diagnosis present

## 2015-03-26 DIAGNOSIS — Z8673 Personal history of transient ischemic attack (TIA), and cerebral infarction without residual deficits: Secondary | ICD-10-CM | POA: Diagnosis not present

## 2015-03-26 DIAGNOSIS — Z882 Allergy status to sulfonamides status: Secondary | ICD-10-CM | POA: Diagnosis not present

## 2015-03-26 DIAGNOSIS — E785 Hyperlipidemia, unspecified: Secondary | ICD-10-CM | POA: Diagnosis present

## 2015-03-26 DIAGNOSIS — J189 Pneumonia, unspecified organism: Secondary | ICD-10-CM

## 2015-03-26 DIAGNOSIS — I513 Intracardiac thrombosis, not elsewhere classified: Secondary | ICD-10-CM | POA: Diagnosis present

## 2015-03-26 DIAGNOSIS — Z806 Family history of leukemia: Secondary | ICD-10-CM | POA: Diagnosis not present

## 2015-03-26 DIAGNOSIS — Z85841 Personal history of malignant neoplasm of brain: Secondary | ICD-10-CM | POA: Diagnosis not present

## 2015-03-26 DIAGNOSIS — E440 Moderate protein-calorie malnutrition: Secondary | ICD-10-CM | POA: Insufficient documentation

## 2015-03-26 DIAGNOSIS — C7931 Secondary malignant neoplasm of brain: Secondary | ICD-10-CM | POA: Diagnosis present

## 2015-03-26 DIAGNOSIS — Z23 Encounter for immunization: Secondary | ICD-10-CM | POA: Diagnosis not present

## 2015-03-26 DIAGNOSIS — Z880 Allergy status to penicillin: Secondary | ICD-10-CM | POA: Diagnosis not present

## 2015-03-26 DIAGNOSIS — Z923 Personal history of irradiation: Secondary | ICD-10-CM | POA: Diagnosis not present

## 2015-03-26 DIAGNOSIS — C3491 Malignant neoplasm of unspecified part of right bronchus or lung: Secondary | ICD-10-CM

## 2015-03-26 DIAGNOSIS — Z9221 Personal history of antineoplastic chemotherapy: Secondary | ICD-10-CM | POA: Diagnosis not present

## 2015-03-26 DIAGNOSIS — I10 Essential (primary) hypertension: Secondary | ICD-10-CM

## 2015-03-26 DIAGNOSIS — Z6832 Body mass index (BMI) 32.0-32.9, adult: Secondary | ICD-10-CM | POA: Diagnosis not present

## 2015-03-26 DIAGNOSIS — Z888 Allergy status to other drugs, medicaments and biological substances status: Secondary | ICD-10-CM | POA: Diagnosis not present

## 2015-03-26 DIAGNOSIS — C7949 Secondary malignant neoplasm of other parts of nervous system: Secondary | ICD-10-CM | POA: Diagnosis present

## 2015-03-26 DIAGNOSIS — J441 Chronic obstructive pulmonary disease with (acute) exacerbation: Secondary | ICD-10-CM | POA: Diagnosis present

## 2015-03-26 DIAGNOSIS — Z8051 Family history of malignant neoplasm of kidney: Secondary | ICD-10-CM | POA: Diagnosis not present

## 2015-03-26 DIAGNOSIS — J90 Pleural effusion, not elsewhere classified: Secondary | ICD-10-CM | POA: Diagnosis present

## 2015-03-26 DIAGNOSIS — Z79899 Other long term (current) drug therapy: Secondary | ICD-10-CM | POA: Diagnosis not present

## 2015-03-26 DIAGNOSIS — F17210 Nicotine dependence, cigarettes, uncomplicated: Secondary | ICD-10-CM | POA: Diagnosis present

## 2015-03-26 DIAGNOSIS — M549 Dorsalgia, unspecified: Secondary | ICD-10-CM | POA: Diagnosis present

## 2015-03-26 DIAGNOSIS — J449 Chronic obstructive pulmonary disease, unspecified: Secondary | ICD-10-CM | POA: Diagnosis not present

## 2015-03-26 DIAGNOSIS — Z791 Long term (current) use of non-steroidal anti-inflammatories (NSAID): Secondary | ICD-10-CM | POA: Diagnosis not present

## 2015-03-26 DIAGNOSIS — Z885 Allergy status to narcotic agent status: Secondary | ICD-10-CM | POA: Diagnosis not present

## 2015-03-26 DIAGNOSIS — I714 Abdominal aortic aneurysm, without rupture: Secondary | ICD-10-CM | POA: Diagnosis present

## 2015-03-26 DIAGNOSIS — I213 ST elevation (STEMI) myocardial infarction of unspecified site: Secondary | ICD-10-CM

## 2015-03-26 LAB — CBC
HEMATOCRIT: 44.8 % (ref 36.0–46.0)
Hemoglobin: 15.4 g/dL — ABNORMAL HIGH (ref 12.0–15.0)
MCH: 32 pg (ref 26.0–34.0)
MCHC: 34.4 g/dL (ref 30.0–36.0)
MCV: 92.9 fL (ref 78.0–100.0)
Platelets: 149 10*3/uL — ABNORMAL LOW (ref 150–400)
RBC: 4.82 MIL/uL (ref 3.87–5.11)
RDW: 12.8 % (ref 11.5–15.5)
WBC: 8.2 10*3/uL (ref 4.0–10.5)

## 2015-03-26 LAB — URINALYSIS, ROUTINE W REFLEX MICROSCOPIC
BILIRUBIN URINE: NEGATIVE
GLUCOSE, UA: NEGATIVE mg/dL
HGB URINE DIPSTICK: NEGATIVE
Ketones, ur: NEGATIVE mg/dL
Leukocytes, UA: NEGATIVE
Nitrite: NEGATIVE
PROTEIN: NEGATIVE mg/dL
Urobilinogen, UA: 0.2 mg/dL (ref 0.0–1.0)
pH: 5 (ref 5.0–8.0)

## 2015-03-26 LAB — I-STAT TROPONIN, ED: TROPONIN I, POC: 0 ng/mL (ref 0.00–0.08)

## 2015-03-26 LAB — MRSA PCR SCREENING: MRSA by PCR: NEGATIVE

## 2015-03-26 LAB — HEPARIN LEVEL (UNFRACTIONATED): HEPARIN UNFRACTIONATED: 0.58 [IU]/mL (ref 0.30–0.70)

## 2015-03-26 MED ORDER — ONDANSETRON HCL 4 MG/2ML IJ SOLN: 4.0000 mg | Freq: Four times a day (QID) | INTRAMUSCULAR | Status: DC | PRN

## 2015-03-26 MED ORDER — ACETAMINOPHEN 650 MG RE SUPP: 650.0000 mg | Freq: Four times a day (QID) | RECTAL | Status: DC | PRN

## 2015-03-26 MED ORDER — ALUM & MAG HYDROXIDE-SIMETH 200-200-20 MG/5ML PO SUSP: 30.0000 mL | Freq: Four times a day (QID) | ORAL | Status: DC | PRN

## 2015-03-26 MED ORDER — HEPARIN BOLUS VIA INFUSION
2000.0000 [IU] | Freq: Once | INTRAVENOUS | Status: AC
  Administered 2015-03-26: 2000 [IU] via INTRAVENOUS
  Filled 2015-03-26: qty 2000

## 2015-03-26 MED ORDER — CETYLPYRIDINIUM CHLORIDE 0.05 % MT LIQD
7.0000 mL | Freq: Two times a day (BID) | OROMUCOSAL | Status: DC
  Administered 2015-03-26 – 2015-03-28 (×5): 7 mL via OROMUCOSAL

## 2015-03-26 MED ORDER — IPRATROPIUM-ALBUTEROL 0.5-2.5 (3) MG/3ML IN SOLN
3.0000 mL | RESPIRATORY_TRACT | Status: DC
  Administered 2015-03-26 (×4): 3 mL via RESPIRATORY_TRACT
  Filled 2015-03-26 (×4): qty 3

## 2015-03-26 MED ORDER — MORPHINE SULFATE (PF) 2 MG/ML IV SOLN: 1.0000 mg | INTRAVENOUS | Status: DC | PRN

## 2015-03-26 MED ORDER — LEVOFLOXACIN IN D5W 750 MG/150ML IV SOLN
750.0000 mg | INTRAVENOUS | Status: DC
  Administered 2015-03-27 – 2015-03-28 (×2): 750 mg via INTRAVENOUS
  Filled 2015-03-26 (×2): qty 150

## 2015-03-26 MED ORDER — LEVOFLOXACIN IN D5W 500 MG/100ML IV SOLN
500.0000 mg | Freq: Once | INTRAVENOUS | Status: AC
  Administered 2015-03-26: 500 mg via INTRAVENOUS
  Filled 2015-03-26: qty 100

## 2015-03-26 MED ORDER — ACETAMINOPHEN 325 MG PO TABS
650.0000 mg | ORAL_TABLET | Freq: Four times a day (QID) | ORAL | Status: DC | PRN
  Administered 2015-03-26: 650 mg via ORAL
  Filled 2015-03-26: qty 2

## 2015-03-26 MED ORDER — HEPARIN (PORCINE) IN NACL 100-0.45 UNIT/ML-% IJ SOLN
1200.0000 [IU]/h | INTRAMUSCULAR | Status: DC
  Administered 2015-03-26: 1200 [IU]/h via INTRAVENOUS
  Filled 2015-03-26 (×2): qty 250

## 2015-03-26 MED ORDER — ALBUTEROL (5 MG/ML) CONTINUOUS INHALATION SOLN
10.0000 mg/h | INHALATION_SOLUTION | Freq: Once | RESPIRATORY_TRACT | Status: AC
  Administered 2015-03-26: 10 mg/h via RESPIRATORY_TRACT
  Filled 2015-03-26: qty 20

## 2015-03-26 MED ORDER — ALBUTEROL SULFATE (2.5 MG/3ML) 0.083% IN NEBU: 2.5000 mg | INHALATION_SOLUTION | RESPIRATORY_TRACT | Status: DC | PRN

## 2015-03-26 MED ORDER — IOHEXOL 350 MG/ML SOLN
100.0000 mL | Freq: Once | INTRAVENOUS | Status: AC | PRN
  Administered 2015-03-26: 100 mL via INTRAVENOUS

## 2015-03-26 MED ORDER — SODIUM CHLORIDE 0.9 % IJ SOLN: 3.0000 mL | INTRAMUSCULAR | Status: DC | PRN

## 2015-03-26 MED ORDER — ENSURE ENLIVE PO LIQD
237.0000 mL | Freq: Two times a day (BID) | ORAL | Status: DC
  Administered 2015-03-26 – 2015-03-28 (×5): 237 mL via ORAL

## 2015-03-26 MED ORDER — GABAPENTIN 300 MG PO CAPS
600.0000 mg | ORAL_CAPSULE | Freq: Two times a day (BID) | ORAL | Status: DC
Start: 2015-03-26 — End: 2015-03-28
  Administered 2015-03-26 – 2015-03-28 (×5): 600 mg via ORAL
  Filled 2015-03-26 (×6): qty 2

## 2015-03-26 MED ORDER — SODIUM CHLORIDE 0.9 % IJ SOLN
3.0000 mL | Freq: Two times a day (BID) | INTRAMUSCULAR | Status: DC
  Administered 2015-03-26 – 2015-03-28 (×3): 3 mL via INTRAVENOUS

## 2015-03-26 MED ORDER — OXYCODONE HCL 5 MG PO TABS
5.0000 mg | ORAL_TABLET | ORAL | Status: DC | PRN
  Administered 2015-03-26 – 2015-03-28 (×5): 5 mg via ORAL
  Filled 2015-03-26 (×5): qty 1

## 2015-03-26 MED ORDER — ONDANSETRON HCL 4 MG PO TABS: 4.0000 mg | ORAL_TABLET | Freq: Four times a day (QID) | ORAL | Status: DC | PRN

## 2015-03-26 MED ORDER — HEPARIN (PORCINE) IN NACL 100-0.45 UNIT/ML-% IJ SOLN
1400.0000 [IU]/h | INTRAMUSCULAR | Status: AC
  Administered 2015-03-27 – 2015-03-28 (×2): 1400 [IU]/h via INTRAVENOUS
  Filled 2015-03-26 (×3): qty 250

## 2015-03-26 MED ORDER — METHYLPREDNISOLONE SODIUM SUCC 125 MG IJ SOLR
125.0000 mg | Freq: Once | INTRAMUSCULAR | Status: AC
  Administered 2015-03-26: 125 mg via INTRAVENOUS
  Filled 2015-03-26: qty 2

## 2015-03-26 MED ORDER — SODIUM CHLORIDE 0.9 % IV SOLN: 250.0000 mL | INTRAVENOUS | Status: DC | PRN

## 2015-03-26 MED ORDER — ALBUTEROL SULFATE (2.5 MG/3ML) 0.083% IN NEBU: 5.0000 mg | INHALATION_SOLUTION | Freq: Once | RESPIRATORY_TRACT | Status: DC

## 2015-03-26 MED ORDER — IPRATROPIUM-ALBUTEROL 0.5-2.5 (3) MG/3ML IN SOLN
3.0000 mL | Freq: Four times a day (QID) | RESPIRATORY_TRACT | Status: DC
  Administered 2015-03-27 – 2015-03-28 (×7): 3 mL via RESPIRATORY_TRACT
  Filled 2015-03-26 (×6): qty 3

## 2015-03-26 MED ORDER — PNEUMOCOCCAL VAC POLYVALENT 25 MCG/0.5ML IJ INJ
0.5000 mL | INJECTION | INTRAMUSCULAR | Status: AC
  Administered 2015-03-27: 0.5 mL via INTRAMUSCULAR
  Filled 2015-03-26 (×2): qty 0.5

## 2015-03-26 NOTE — Progress Notes (Signed)
Initial Nutrition Assessment  DOCUMENTATION CODES:   Non-severe (moderate) malnutrition in context of acute illness/injury, Obesity unspecified  INTERVENTION:  - Continue Ensure Enlive BID, each supplement provides 350 kcal and 20 grams of protein - RD will continue to monitor for needs  NUTRITION DIAGNOSIS:   Inadequate oral intake related to poor appetite as evidenced by per patient/family report.  GOAL:   Patient will meet greater than or equal to 90% of their needs  MONITOR:   PO intake, Supplement acceptance, Weight trends, Labs, I & O's  REASON FOR ASSESSMENT:   Malnutrition Screening Tool  ASSESSMENT:   64 y.o. female with a history of metastatic Lung Cancer to the Brain S/P Chemo Rx, and Radiation RX, and Radiation Treatement to the Brain, COPD, HTN, Hyperlipidemia, and Previous CVA who presents to the ED with complaints of increased SOB and chest tightness, along with 10/10Right sided Back Pain that started at 6:30 PM. The pain is worse with a deep breath. A CTA of the Chest was performed and revealed Airspace Disease, a Mural thrombus, and a Left Pleural Effusion. She was administered IV Levaquin, Solumedrol, and Nebulizer Treatments and also placed on IV Heparin drip and referred for admission.   Pt seen for MST. BMI indicates obesity. Pt reports she ate toast and part of an egg white omelet for breakfast this AM but she did not like the taste of the omelet. She states that over the past 1 month her appetite has been declining and she is not eating as much as she previously had. She denies taste alterations during this time but states she has lost ~23 lbs in the past 1 month.   Per chart review, pt has lost 17 lbs (9% body weight) in the past 3.5 months which is significant for time frame. Unable to confirm 23 lb weight loss in past 1 month. Mild muscle and fat wasting noted.   She states she drank part of an Ensure this AM but that she was not drinking this  supplement PTA. Encouraged pt to drink supplement as able and she requests chocolate-flavor.   Not currently meeting needs. Medications reviewed. Labs reviewed.   Diet Order:  Diet Heart Room service appropriate?: Yes; Fluid consistency:: Thin  Skin:  Reviewed, no issues  Last BM:  PTA  Height:   Ht Readings from Last 1 Encounters:  03/26/15 '5\' 3"'$  (1.6 m)    Weight:   Wt Readings from Last 1 Encounters:  03/26/15 177 lb 14.6 oz (80.7 kg)    Ideal Body Weight:  52.27 kg (kg)  BMI:  Body mass index is 31.52 kg/(m^2).  Estimated Nutritional Needs:   Kcal:  2025-2300  Protein:  85-95 grams  Fluid:  2 L/day  EDUCATION NEEDS:   No education needs identified at this time     Jarome Matin, RD, LDN Inpatient Clinical Dietitian Pager # [phone] After hours/weekend pager # [phone]

## 2015-03-26 NOTE — ED Notes (Signed)
Patient is still asleep and does not have to urinate at this time.

## 2015-03-26 NOTE — Progress Notes (Signed)
ANTICOAGULATION CONSULT NOTE - Initial Consult  Pharmacy Consult for Heparin Indication: DVT  Allergies  Allergen Reactions  . Carboplatin Itching  . Codeine Nausea And Vomiting  . Sulfa Antibiotics   . Dilaudid [Hydromorphone Hcl] Nausea And Vomiting  . Penicillins Itching and Rash    Patient Measurements: Height: '5\' 3"'$  (160 cm) Weight: 195 lb (88.451 kg) IBW/kg (Calculated) : 52.4 Heparin Dosing Weight:   Vital Signs: Temp: 98.4 F (36.9 C) (10/02 2211) Temp Source: Oral (10/02 2211) BP: 116/74 mmHg (10/03 0401) Pulse Rate: 75 (10/03 0401)  Labs:  Recent Labs  03/25/15 2325 03/25/15 2341  HGB 16.0* 17.3*  HCT 47.2* 51.0*  PLT 198  --   CREATININE  --  0.80    Estimated Creatinine Clearance: 75.9 mL/min (by C-G formula based on Cr of 0.8).   Medical History: Past Medical History  Diagnosis Date  . Hypertension   . COPD (chronic obstructive pulmonary disease) ([place])   . Stroke ([place])   . Abdominal aortic aneurysm ([place])   . Dyslipidemia   . S/P radiation therapy 7/12 thru 9/12, 04/18/11    xrt to brain mets  . Lung cancer ([place]) 12/2010  . Brain cancer ([place])     mets from lung primary  . Allergy     codeine,pcn,sulfa drugs  . Radiation 10/20/2011    frontal mets/Palliation  . History of radiation therapy 01/19/12, 01/21/12, 01/26/12    RLL lung  . History of radiation therapy 04/18/11    single fraction to 6 brain mets  . Clotted vascular catheter ([place]) 03/30/2012  . Lung cancer ([place]) 12/22/2010    Medications:  Infusions:  . heparin      Assessment: Patient with thrombus per ED MD.  No oral anticoagulants noted on med rec.   Goal of Therapy:  Heparin level 0.3-0.7 units/ml Monitor platelets by anticoagulation protocol: Yes   Plan:  Heparin bolus 2000 units iv x1 Heparin drip at 1200 units/hr Daily CBC Next heparin level at 1400    [address], Shea Stakes [place] 03/26/2015,4:37 AM

## 2015-03-26 NOTE — Progress Notes (Signed)
63 year old with h/o metastatic non small cell cancer, s/p chemo, radiation treatments to the brain and back presents with sob and back pain.she was found to have mural thrombus and a left pleural effusion. She was started on IV heparin and admitted to stepdown.   She was being treated for copd exacerbation and mural thrombus.   PLAN TO transfer pt to telemetry tonight.    Hosie Poisson, MD [phone].

## 2015-03-26 NOTE — Care Management Note (Signed)
Case Management Note  Patient Details  Name: TAIESHA BOVARD MRN: [id number] Date of Birth: [birth year]  Subjective/Objective:            copd        Action/Plan: Date:  Oct. 03, 2016 U.R. performed for needs and level of care. Will continue to follow for Case Management needs.  Velva Harman, RN, BSN, [place]   [phone]   Expected Discharge Date:                  Expected Discharge Plan:  Home/Self Care  In-House Referral:  NA  Discharge planning Services  CM Consult  Post Acute Care Choice:  NA Choice offered to:  NA  DME Arranged:    DME Agency:     HH Arranged:    [place] Agency:     Status of Service:  Completed, signed off  Medicare Important Message Given:    Date Medicare IM Given:    Medicare IM give by:    Date Additional Medicare IM Given:    Additional Medicare Important Message give by:     If discussed at [place] of Stay Meetings, dates discussed:    Additional Comments:  Leeroy Cha, RN 03/26/2015, 10:45 AM

## 2015-03-26 NOTE — Progress Notes (Addendum)
PHARMACIST - PHYSICIAN COMMUNICATION CONCERNING:  IV Heparin  64 y.o. female with a history of metastatic Lung Cancer to the Brain S/P chemo and radiation who presents to the ED with complaints of increased SOB and chest tightness, along with 10/10 right sided back pain. A CTA of the chest was performed and revealed airspace disease, a mural thrombus, and a left pleural effusion. Pharmacy consulted to dose IV heparin.  10/4: Currently IV heparin at 1200 units/hr.  First heparin level is subtherapeutic at < 0.10 (goal 0.3-0.7).  Plts slightly low at 149K and Hgb ok.  No issues with bleeding or infusion per RN.    Heparin dosing weight = 70kg  RECOMMENDATION: Bolus heparin 2000 units x 1 (~30 units/kg) Increase heparin infusion to 1400 units/hr = 14 ml/hr.  Recheck heparin level in 6 hours.    Ralene Bathe, PharmD, BCPS 03/26/2015, 3:20 PM  Pager: [phone]

## 2015-03-26 NOTE — H&P (Signed)
[facility] Admission History and Physical       Courtney West AUQ:[id number] DOB: [birth year] DOA: 03/25/2015  Referring physician: EDP PCP: Shirline Frees, MD  Specialists:   Chief Complaint: SOB and Back Pain  HPI: Courtney West is a 64 y.o. female with a history of metastatic Lung Cancer to the Brain S/P Chemo Rx, and Radiation RX, and Radiation Treatement to the Brain, COPD, HTN, Hyperlipidemia, and Previous CVA who presents to the ED with complaints of increased SOB and chest tightness, along with 10/10Right sided Back Pain that started at 6:30 PM.   The pain is worse with a deep breath.   A CTA of the Chest was performed and revealed Airspace Disease, a Mural thrombus, and a Left Pleural Effusion.  She was administered IV Levaquin, Solumedrol, and Nebulizer Treatments and also placed on IV Heparin drip and referred for admission.     Review of Systems:  Constitutional: No Weight Loss, No Weight Gain, Night Sweats, Fevers, Chills, Dizziness, Light Headedness, Fatigue, or Generalized Weakness HEENT: No Headaches, Difficulty Swallowing,Tooth/Dental Problems,Sore Throat,  No Sneezing, Rhinitis, Ear Ache, Nasal Congestion, or Post Nasal Drip,  Cardio-vascular:  No Chest pain, Orthopnea, PND, Edema in Lower Extremities, Anasarca, Dizziness, Palpitations  Resp: +Dyspnea, No DOE, No Productive Cough, No Non-Productive Cough, No Hemoptysis, No Wheezing.    GI: No Heartburn, Indigestion, Abdominal Pain, Nausea, Vomiting, Diarrhea, Constipation, Hematemesis, Hematochezia, Melena, Change in Bowel Habits,  Loss of Appetite  GU: No Dysuria, No Change in Color of Urine, No Urgency or Urinary Frequency, No Flank pain.  Musculoskeletal: No Joint Pain or Swelling, No Decreased Range of Motion, +Right Sided Paraspinal Lumbar Area Back Pain.  Neurologic: No Syncope, No Seizures, Muscle Weakness, Paresthesia, Vision Disturbance or Loss, No Diplopia, No Vertigo, No Difficulty Walking,  Skin: No  Rash or Lesions. Psych: No Change in Mood or Affect, No Depression or Anxiety, No Memory loss, No Confusion, or Hallucinations   Past Medical History  Diagnosis Date  . Hypertension   . COPD (chronic obstructive pulmonary disease) ([place])   . Stroke ([place])   . Abdominal aortic aneurysm ([place])   . Dyslipidemia   . S/P radiation therapy 7/12 thru 9/12, 04/18/11    xrt to brain mets  . Lung cancer ([place]) 12/2010  . Brain cancer ([place])     mets from lung primary  . Allergy     codeine,pcn,sulfa drugs  . Radiation 10/20/2011    frontal mets/Palliation  . History of radiation therapy 01/19/12, 01/21/12, 01/26/12    RLL lung  . History of radiation therapy 04/18/11    single fraction to 6 brain mets  . Clotted vascular catheter ([place]) 03/30/2012  . Lung cancer ([place]) 12/22/2010     Past Surgical History  Procedure Laterality Date  . Carpal tunnel release    . Left knee surgery    . Cholecystectomy    . Partial hysterectomy        Prior to Admission medications   Medication Sig Start Date End Date Taking? Authorizing Provider  albuterol (PROVENTIL HFA;VENTOLIN HFA) 108 (90 BASE) MCG/ACT inhaler Inhale 2 puffs into the lungs every 4 (four) hours as needed for wheezing or shortness of breath. 12/20/14  Yes Tyler Pita, MD  eszopiclone (LUNESTA) 2 MG TABS tablet Take 1 tablet (2 mg total) by mouth at bedtime as needed for sleep. Take immediately before bedtime 12/20/14  Yes Tyler Pita, MD  gabapentin (NEURONTIN) 300 MG capsule Take 1 capsule (300 mg total) by  mouth daily. Patient taking differently: Take 600 mg by mouth 2 (two) times daily.  02/07/15  Yes Tyler Pita, MD  SYMBICORT 80-4.5 MCG/ACT inhaler INHALE TWO PUFFS INTO LUNGS AS NEEDED Patient taking differently: INHALE TWO PUFFS INTO LUNGS AS NEEDED for shortness of breath 06/30/14  Yes Tyler Pita, MD  benzonatate (TESSALON) 100 MG capsule Take 1 capsule (100 mg total) by mouth every 8 (eight) hours. Patient not taking:  Reported on 12/20/2014 06/04/14   Veryl Speak, MD  diclofenac sodium (VOLTAREN) 1 % GEL Apply 2 g topically 4 (four) times daily. Patient not taking: Reported on 12/20/2014 11/23/14   Tyler Pita, MD  gabapentin (NEURONTIN) 300 MG capsule Take 2 capsules (600 mg total) by mouth 3 (three) times daily. Patient not taking: Reported on 03/25/2015 03/25/15   Tyler Pita, MD  ibuprofen (ADVIL,MOTRIN) 200 MG tablet Take 600 mg by mouth every 6 (six) hours as needed for headache.    Historical Provider, MD     Allergies  Allergen Reactions  . Carboplatin Itching  . Codeine Nausea And Vomiting  . Sulfa Antibiotics   . Dilaudid [Hydromorphone Hcl] Nausea And Vomiting  . Penicillins Itching and Rash    Social History:  reports that she has been smoking Cigarettes.  She has a 50 pack-year smoking history. She does not have any smokeless tobacco history on file. She reports that she does not drink alcohol or use illicit drugs.    Family History  Problem Relation Age of Onset  . Cancer Father     lung, leukemia  . Cancer Maternal Aunt     kidney       Physical Exam:  GEN:  Pleasant Obese 64 y.o. Caucasian female examined and in no acute distress; cooperative with exam Filed Vitals:   03/26/15 0044 03/26/15 0338 03/26/15 0355 03/26/15 0401  BP: 110/64 124/79  116/74  Pulse: 88 76  75  Temp:      TempSrc:      Resp: 20 18  20   Height:      Weight:      SpO2: 86% 97% 100% 100%   Blood pressure 116/74, pulse 75, temperature 98.4 F (36.9 C), temperature source Oral, resp. rate 20, height 5' 3"  (1.6 m), weight 88.451 kg (195 lb), SpO2 100 %. PSYCH: She is alert and oriented x4; does not appear anxious does not appear depressed; affect is normal HEENT: Normocephalic and Atraumatic, Mucous membranes pink; PERRLA; EOM intact; Fundi:  Benign;  No scleral icterus, Nares: Patent, Oropharynx: Clear, Edentulous  Except for 2 Lower Teeth,    Neck:  FROM, No Cervical Lymphadenopathy nor  Thyromegaly or Carotid Bruit; No JVD; Breasts:: Not examined CHEST WALL: No tenderness CHEST: Normal respiration, clear to auscultation bilaterally HEART: Regular rate and rhythm; no murmurs rubs or gallops BACK: No kyphosis or scoliosis; No CVA tenderness ABDOMEN: Positive Bowel Sounds,Obese, Soft Non-Tender, No Rebound or Guarding; No Masses, No Organomegaly Rectal Exam: Not done EXTREMITIES: No Cyanosis, Clubbing, or Edema; No Ulcerations. Genitalia: not examined PULSES: 2+ and symmetric SKIN: Normal hydration no rash or ulceration CNS:  Alert and Oriented x 4, No Focal Deficits Vascular: pulses palpable throughout    Labs on Admission:  Basic Metabolic Panel:  Recent Labs Lab 03/25/15 2341  NA 144  K 3.8  CL 106  GLUCOSE 102*  BUN 19  CREATININE 0.80   Liver Function Tests: No results for input(s): AST, ALT, ALKPHOS, BILITOT, PROT, ALBUMIN in the last 168 hours. No results for  input(s): LIPASE, AMYLASE in the last 168 hours. No results for input(s): AMMONIA in the last 168 hours. CBC:  Recent Labs Lab 03/25/15 2325 03/25/15 2341  WBC 12.3*  --   NEUTROABS 7.8*  --   HGB 16.0* 17.3*  HCT 47.2* 51.0*  MCV 94.4  --   PLT 198  --    Cardiac Enzymes: No results for input(s): CKTOTAL, CKMB, CKMBINDEX, TROPONINI in the last 168 hours.  BNP (last 3 results) No results for input(s): BNP in the last 8760 hours.  ProBNP (last 3 results) No results for input(s): PROBNP in the last 8760 hours.  CBG: No results for input(s): GLUCAP in the last 168 hours.  Radiological Exams on Admission: Ct Angio Chest Pe W/cm &/or Wo Cm  03/26/2015   CLINICAL DATA:  Acute onset of right flank pain and chronic cough. Decreased O2 saturation. Leukocytosis. Known metastatic lung cancer to the brain. Initial encounter.  EXAM: CT ANGIOGRAPHY CHEST WITH CONTRAST  TECHNIQUE: Multidetector CT imaging of the chest was performed using the standard protocol during bolus administration of  intravenous contrast. Multiplanar CT image reconstructions and MIPs were obtained to evaluate the vascular anatomy.  CONTRAST:  100 mL of Omnipaque 350 IV contrast  COMPARISON:  CT of the chest performed 12/11/2014  FINDINGS: There is no evidence of pulmonary embolus.  Focal right basilar airspace opacity is similar in appearance to the prior study and may simply reflect postradiation change, though recurrent disease again cannot be excluded. PET/CT could be considered for further evaluation. Airspace opacification at the left hilum likely reflects postradiation change. Underlying emphysematous change is noted bilaterally, more prominent at the upper lung lobes. There is no evidence of pleural effusion or pneumothorax. No masses are identified; no abnormal focal contrast enhancement is seen.  Scattered mildly prominent right hilar and azygoesophageal recess nodes are seen, measuring up to 1.2 cm in short axis. A moderate pericardial effusion is noted. Scattered calcification is noted along the aortic arch, with mild mural thrombus at the right innominate artery. The great vessels are otherwise grossly unremarkable. No axillary lymphadenopathy is seen. The thyroid gland is unremarkable in appearance. A right-sided chest port is noted.  The visualized portions of the liver and spleen are unremarkable, aside from mild scarring and calcification along the periphery of the spleen.  No acute osseous abnormalities are seen.  Review of the MIP images confirms the above findings.  IMPRESSION: 1. No evidence of pulmonary embolus. 2. Focal right basilar airspace opacity is similar in appearance to the prior study and may simply reflect postradiation change, though worsened from 2015. Recurrent disease again cannot be excluded. PET/CT would be helpful for further evaluation, as deemed clinically appropriate. 3. Airspace opacification of the left hilum likely reflects postradiation change. 4. Mildly prominent right hilar and  azygoesophageal recess nodes noted, similar and nonspecific in appearance. 5. Moderate pericardial effusion again noted. 6. Mild mural thrombus again noted at the right innominate artery.   Electronically Signed   By: Garald Balding M.D.   On: 03/26/2015 01:31     EKG: Independently reviewed.     Assessment/Plan:   64 y.o. female with  Active Problems:   1.    COPD Exacerbation/COPD (chronic obstructive pulmonary disease) (HCC)   IV Steroids   DuoNebs   O2   Home Meds of Symbicort, Albuterol MDI PRN     2.   Bronchogenic cancer of right lung ([place])   With Met Disease to Brain S/P chemo and Rad  Rx    Last Treatements 2.5 years ago       3.   CAP (community acquired pneumonia)   IV Levaquin     4.   Mural thrombus of heart (HCC)   IV Heparin drip started     5.   Hypertension- hx   Monitor BPs       6.   Dyslipidemia   Not On Current Meds     7.   Leukocytosis- due to #3   Monitor Trend     8.   DVT Prophylaxis   On IV Heparin Drip     Code Status:     FULL CODE      Family Communication:   Family at Bedside     Disposition Plan:    Inpatient Status        Time spent:  [facility] Pager [phone]   If [place] Please Contact the Day Rounding Team MD for [facility]  If 7PM-7AM, Please Contact Night-Floor Coverage  [email or web address] Password [email or web address] 03/26/2015, 5:05 AM     ADDENDUM:   Patient was seen and examined on 03/26/2015

## 2015-03-26 NOTE — ED Notes (Signed)
Patient transported to X-ray 

## 2015-03-26 NOTE — Progress Notes (Signed)
Report called to D.Brown,RN on 4th floor, telemetry.

## 2015-03-27 ENCOUNTER — Telehealth: Payer: Self-pay | Admitting: Radiation Oncology

## 2015-03-27 DIAGNOSIS — J441 Chronic obstructive pulmonary disease with (acute) exacerbation: Secondary | ICD-10-CM | POA: Diagnosis not present

## 2015-03-27 DIAGNOSIS — J449 Chronic obstructive pulmonary disease, unspecified: Secondary | ICD-10-CM

## 2015-03-27 LAB — CBC
HCT: 43.8 % (ref 36.0–46.0)
Hemoglobin: 14.8 g/dL (ref 12.0–15.0)
MCH: 31.6 pg (ref 26.0–34.0)
MCHC: 33.8 g/dL (ref 30.0–36.0)
MCV: 93.4 fL (ref 78.0–100.0)
PLATELETS: 156 10*3/uL (ref 150–400)
RBC: 4.69 MIL/uL (ref 3.87–5.11)
RDW: 12.8 % (ref 11.5–15.5)
WBC: 14.7 10*3/uL — AB (ref 4.0–10.5)

## 2015-03-27 LAB — BASIC METABOLIC PANEL
ANION GAP: 9 (ref 5–15)
BUN: 20 mg/dL (ref 6–20)
CALCIUM: 9.2 mg/dL (ref 8.9–10.3)
CO2: 26 mmol/L (ref 22–32)
CREATININE: 0.72 mg/dL (ref 0.44–1.00)
Chloride: 105 mmol/L (ref 101–111)
GFR calc Af Amer: >60 mL/min (ref >60)
GLUCOSE: 95 mg/dL (ref 65–99)
Potassium: 3.9 mmol/L (ref 3.5–5.1)
Sodium: 140 mmol/L (ref 135–145)

## 2015-03-27 LAB — HEPARIN LEVEL (UNFRACTIONATED): Heparin Unfractionated: 0.59 IU/mL (ref 0.30–0.70)

## 2015-03-27 MED ORDER — PREDNISONE 20 MG PO TABS
40.0000 mg | ORAL_TABLET | Freq: Every day | ORAL | Status: DC
  Administered 2015-03-28: 40 mg via ORAL
  Filled 2015-03-27: qty 2

## 2015-03-27 NOTE — Progress Notes (Signed)
ANTICOAGULATION CONSULT NOTE - Follow Up Consult  Pharmacy Consult for Heparin Indication: DVT  Allergies  Allergen Reactions  . Carboplatin Itching  . Codeine Nausea And Vomiting  . Sulfa Antibiotics   . Dilaudid [Hydromorphone Hcl] Nausea And Vomiting  . Penicillins Itching and Rash    Patient Measurements: Height: '5\' 3"'$  (160 cm) Weight: 182 lb 12.2 oz (82.9 kg) IBW/kg (Calculated) : 52.4 Heparin Dosing Weight:   Vital Signs: Temp: 98 F (36.7 C) (10/03 2100) Temp Source: Oral (10/03 2100) BP: 119/62 mmHg (10/03 2100) Pulse Rate: 92 (10/03 2100)  Labs:  Recent Labs  03/25/15 2325 03/25/15 2341 03/26/15 0506 03/26/15 1416 03/26/15 2226  HGB 16.0* 17.3* 15.4*  --   --   HCT 47.2* 51.0* 44.8  --   --   PLT 198  --  149*  --   --   HEPARINUNFRC  --   --   --  <0.10* 0.58  CREATININE  --  0.80  --   --   --     Estimated Creatinine Clearance: 73.4 mL/min (by C-G formula based on Cr of 0.8).   Medications:  Infusions:  . heparin 1,400 Units/hr (03/26/15 2000)    Assessment: Patient with heparin level at goal.  No heparin issues noted.  Goal of Therapy:  Heparin level 0.3-0.7 units/ml Monitor platelets by anticoagulation protocol: Yes   Plan:  Continue heparin drip at current rate Recheck level with AM labs  Tyler Deis, Shea Stakes [place] 03/27/2015,4:33 AM

## 2015-03-27 NOTE — Progress Notes (Signed)
[facility] PROGRESS NOTE  Courtney West ATF:[id number] DOB: Nov 09, 1950 DOA: 03/25/2015 PCP: Shirline Frees, MD Interim summary: 64 year old with h/o metastatic non small cell cancer, s/p chemo, radiation treatments to the brain and back presents with sob and back pain.she was found to have mural thrombus and a left pleural effusion. She was started on IV heparin and admitted to stepdown.  Assessment/Plan: Acute Copd exacerbation: Improving. On tapering steroids.  Resume duonebs.   Right sided pneumonia:  Resume levaquin.   Mural thrombus: On IV heparin.  Plan to change to xarelto on discharge.    Hypertension: Well controlled.   Disposition: Spoke in detail with his son today, he reorts her house has been condemned because of the animals. Its not safe for her to go home. PT eval ordered.   Code Status: full code.  Family Communication: son at bedside.     Consultants:  none  Procedures: none  Antibiotics:  Levaquin.   HPI/Subjective:wants to know if she can go home.   Objective: Filed Vitals:   03/27/15 1408  BP: 92/62  Pulse: 64  Temp: 98.1 F (36.7 C)  Resp: 18    Intake/Output Summary (Last 24 hours) at 03/27/15 1934 Last data filed at 03/27/15 1409  Gross per 24 hour  Intake    826 ml  Output      0 ml  Net    826 ml   Filed Weights   03/25/15 2211 03/26/15 0547 03/26/15 2100  Weight: 88.451 kg (195 lb) 80.7 kg (177 lb 14.6 oz) 82.9 kg (182 lb 12.2 oz)    Exam:   General:  Appears anxious,   Cardiovascular: s1s2  Respiratory: bilateral wheezing.   Abdomen: soft NT nd bs+  Musculoskeletal: no pedal edema.   Data Reviewed: Basic Metabolic Panel:  Recent Labs Lab 03/25/15 2341 03/27/15 0540  NA 144 140  K 3.8 3.9  CL 106 105  CO2  --  26  GLUCOSE 102* 95  BUN 19 20  CREATININE 0.80 0.72  CALCIUM  --  9.2   Liver Function Tests: No results for input(s): AST, ALT, ALKPHOS, BILITOT, PROT, ALBUMIN in the last 168  hours. No results for input(s): LIPASE, AMYLASE in the last 168 hours. No results for input(s): AMMONIA in the last 168 hours. CBC:  Recent Labs Lab 03/25/15 2325 03/25/15 2341 03/26/15 0506 03/27/15 0540  WBC 12.3*  --  8.2 14.7*  NEUTROABS 7.8*  --   --   --   HGB 16.0* 17.3* 15.4* 14.8  HCT 47.2* 51.0* 44.8 43.8  MCV 94.4  --  92.9 93.4  PLT 198  --  149* 156   Cardiac Enzymes: No results for input(s): CKTOTAL, CKMB, CKMBINDEX, TROPONINI in the last 168 hours. BNP (last 3 results) No results for input(s): BNP in the last 8760 hours.  ProBNP (last 3 results) No results for input(s): PROBNP in the last 8760 hours.  CBG: No results for input(s): GLUCAP in the last 168 hours.  Recent Results (from the past 240 hour(s))  MRSA PCR Screening     Status: None   Collection Time: 03/26/15  5:56 AM  Result Value Ref Range Status   MRSA by PCR NEGATIVE NEGATIVE Final    Comment:        The GeneXpert MRSA Assay (FDA approved for NASAL specimens only), is one component of a comprehensive MRSA colonization surveillance program. It is not intended to diagnose MRSA infection nor to guide or monitor treatment  for MRSA infections.      Studies: Ct Angio Chest Pe W/cm &/or Wo Cm  03/26/2015   CLINICAL DATA:  Acute onset of right flank pain and chronic cough. Decreased O2 saturation. Leukocytosis. Known metastatic lung cancer to the brain. Initial encounter.  EXAM: CT ANGIOGRAPHY CHEST WITH CONTRAST  TECHNIQUE: Multidetector CT imaging of the chest was performed using the standard protocol during bolus administration of intravenous contrast. Multiplanar CT image reconstructions and MIPs were obtained to evaluate the vascular anatomy.  CONTRAST:  100 mL of Omnipaque 350 IV contrast  COMPARISON:  CT of the chest performed 12/11/2014  FINDINGS: There is no evidence of pulmonary embolus.  Focal right basilar airspace opacity is similar in appearance to the prior study and may simply  reflect postradiation change, though recurrent disease again cannot be excluded. PET/CT could be considered for further evaluation. Airspace opacification at the left hilum likely reflects postradiation change. Underlying emphysematous change is noted bilaterally, more prominent at the upper lung lobes. There is no evidence of pleural effusion or pneumothorax. No masses are identified; no abnormal focal contrast enhancement is seen.  Scattered mildly prominent right hilar and azygoesophageal recess nodes are seen, measuring up to 1.2 cm in short axis. A moderate pericardial effusion is noted. Scattered calcification is noted along the aortic arch, with mild mural thrombus at the right innominate artery. The great vessels are otherwise grossly unremarkable. No axillary lymphadenopathy is seen. The thyroid gland is unremarkable in appearance. A right-sided chest port is noted.  The visualized portions of the liver and spleen are unremarkable, aside from mild scarring and calcification along the periphery of the spleen.  No acute osseous abnormalities are seen.  Review of the MIP images confirms the above findings.  IMPRESSION: 1. No evidence of pulmonary embolus. 2. Focal right basilar airspace opacity is similar in appearance to the prior study and may simply reflect postradiation change, though worsened from 2015. Recurrent disease again cannot be excluded. PET/CT would be helpful for further evaluation, as deemed clinically appropriate. 3. Airspace opacification of the left hilum likely reflects postradiation change. 4. Mildly prominent right hilar and azygoesophageal recess nodes noted, similar and nonspecific in appearance. 5. Moderate pericardial effusion again noted. 6. Mild mural thrombus again noted at the right innominate artery.   Electronically Signed   By: Garald Balding M.D.   On: 03/26/2015 01:31    Scheduled Meds: . albuterol  5 mg Nebulization Once  . antiseptic oral rinse  7 mL Mouth Rinse BID   . feeding supplement (ENSURE ENLIVE)  237 mL Oral BID BM  . gabapentin  600 mg Oral BID  . ipratropium-albuterol  3 mL Nebulization Q6H  . levofloxacin (LEVAQUIN) IV  750 mg Intravenous Q24H  . sodium chloride  3 mL Intravenous Q12H   Continuous Infusions: . heparin 1,400 Units/hr (03/27/15 1243)    Active Problems:   Bronchogenic cancer of right lung (HCC)   COPD (chronic obstructive pulmonary disease) (HCC)   CAP (community acquired pneumonia)   Mural thrombus of heart (HCC)   Hypertension   Dyslipidemia   Leukocytosis   COPD exacerbation (HCC)   Malnutrition of moderate degree    Time spent: 25 minutes.     [facility] Pager [phone] If 7PM-7AM, please contact night-coverage at [email or web address], password [facility] 03/27/2015, 7:34 PM  LOS: 1 day

## 2015-03-27 NOTE — Telephone Encounter (Signed)
Patient left message requesting return call. Phoned patient back. Patient requesting a refill of her Lunesta. Explained that Dr. Tammi Klippel filled this last on 12/20/2014 and gave five refills. Patient verbalized understanding. Patient hospitalized in ICU stepdown. Reports she isn't feeling much better and still has significant back pain. Reports she is hoping to go home soon. Denies any further needs at this time.

## 2015-03-28 ENCOUNTER — Telehealth: Payer: Self-pay

## 2015-03-28 LAB — HEPARIN LEVEL (UNFRACTIONATED): HEPARIN UNFRACTIONATED: 0.5 [IU]/mL (ref 0.30–0.70)

## 2015-03-28 LAB — CBC
HCT: 45.7 % (ref 36.0–46.0)
HEMOGLOBIN: 15.7 g/dL — AB (ref 12.0–15.0)
MCH: 32.3 pg (ref 26.0–34.0)
MCHC: 34.4 g/dL (ref 30.0–36.0)
MCV: 94 fL (ref 78.0–100.0)
Platelets: 148 10*3/uL — ABNORMAL LOW (ref 150–400)
RBC: 4.86 MIL/uL (ref 3.87–5.11)
RDW: 13.1 % (ref 11.5–15.5)
WBC: 8.8 10*3/uL (ref 4.0–10.5)

## 2015-03-28 MED ORDER — RIVAROXABAN 15 MG PO TABS
15.0000 mg | ORAL_TABLET | Freq: Two times a day (BID) | ORAL | Status: DC
  Administered 2015-03-28 (×2): 15 mg via ORAL
  Filled 2015-03-28 (×3): qty 1

## 2015-03-28 MED ORDER — LEVOFLOXACIN 750 MG PO TABS
750.0000 mg | ORAL_TABLET | Freq: Every day | ORAL | Status: DC
Start: 2015-03-28 — End: 2015-04-15

## 2015-03-28 MED ORDER — RIVAROXABAN 20 MG PO TABS: 20.0000 mg | ORAL_TABLET | Freq: Every day | ORAL | Status: DC

## 2015-03-28 MED ORDER — PREDNISONE 10 MG PO TABS
ORAL_TABLET | ORAL | Status: DC
Start: 2015-03-28 — End: 2015-04-15

## 2015-03-28 MED ORDER — RIVAROXABAN (XARELTO) VTE STARTER PACK (15 & 20 MG): ORAL_TABLET | ORAL | Status: DC

## 2015-03-28 MED ORDER — GABAPENTIN 300 MG PO CAPS: 600.0000 mg | ORAL_CAPSULE | Freq: Two times a day (BID) | ORAL | Status: DC

## 2015-03-28 MED ORDER — IPRATROPIUM-ALBUTEROL 0.5-2.5 (3) MG/3ML IN SOLN
RESPIRATORY_TRACT | Status: AC
  Filled 2015-03-28: qty 3

## 2015-03-28 MED ORDER — IPRATROPIUM-ALBUTEROL 0.5-2.5 (3) MG/3ML IN SOLN: 3.0000 mL | Freq: Four times a day (QID) | RESPIRATORY_TRACT | Status: DC | PRN

## 2015-03-28 NOTE — Discharge Summary (Signed)
Physician Discharge Summary  TANAYA DUNIGAN YTK:[id number] DOB: [birth year] DOA: 03/25/2015  PCP: Shirline Frees, MD  Admit date: 03/25/2015 Discharge date: 03/28/2015  Time spent: 25* minutes  Recommendations for Outpatient Follow-up:  1. *Follow up PCP in 2 weeks  Discharge Diagnoses:  Active Problems:   Bronchogenic cancer of right lung (HCC)   COPD (chronic obstructive pulmonary disease) (HCC)   CAP (community acquired pneumonia)   Mural thrombus of heart (HCC)   Hypertension   Dyslipidemia   Leukocytosis   COPD exacerbation (HCC)   Malnutrition of moderate degree   Discharge Condition: Stable  Diet recommendation: low salt diet  Filed Weights   03/25/15 2211 03/26/15 0547 03/26/15 2100  Weight: 88.451 kg (195 lb) 80.7 kg (177 lb 14.6 oz) 82.9 kg (182 lb 12.2 oz)    History of present illness:  64 year old with h/o metastatic non small cell cancer, s/p chemo, radiation treatments to the brain and back presents with sob and back pain.she was found to have mural thrombus and a left pleural effusion. She was started on IV heparin and admitted to stepdown.   Hospital Course:   COPD exacerbation Patient was started on Duoneb nebulizer, IV solumedrol. Will discharged home on prednisone taper and duonebs prn Will discharge home on continuous home O2  ? Pneumonia CT showed possible pneumonia, will discharge home on po Levaquin for for total 10 days of therapy.  Mural thrombus CT angiogram showed mild there are thrombus in the right innominate artery. Patient started on [place] and discussed with patient's oncologist  Dr. Julien Nordmann  Hypertension Blood pressure controlled    Procedures: None Consultations:  None  Discharge Exam: Filed Vitals:   03/28/15 1454  BP: 105/81  Pulse: 88  Temp: 98.1 F (36.7 C)  Resp: 16    General: *Appears in no acute distress Cardiovascular: S1-S2 regular Respiratory: Clear to auscultation bilaterally  Discharge  Instructions   Discharge Instructions    Diet - low sodium heart healthy    Complete by:  As directed      Increase activity slowly    Complete by:  As directed           Current Discharge Medication List    START taking these medications   Details  ipratropium-albuterol (DUONEB) 0.5-2.5 (3) MG/3ML SOLN Take 3 mLs by nebulization every 6 (six) hours as needed. Qty: 360 mL, Refills: 2    levofloxacin (LEVAQUIN) 750 MG tablet Take 1 tablet (750 mg total) by mouth daily. Qty: 9 tablet, Refills: 0    predniSONE (DELTASONE) 10 MG tablet Prednisone 40 mg po daily x 1 day then Prednisone 30 mg po daily x 1 day then Prednisone 20 mg po daily x 1 day then Prednisone 10 mg daily x 1 day then stop... Qty: 10 tablet, Refills: 0    Rivaroxaban (XARELTO STARTER PACK) 15 & 20 MG TBPK Take as directed on package: Start with one '15mg'$  tablet by mouth twice a day with food. On Day 22, switch to one '20mg'$  tablet once a day with food. Qty: 51 each, Refills: 2    rivaroxaban (XARELTO) 20 MG TABS tablet Take 1 tablet (20 mg total) by mouth daily with supper. Qty: 30 tablet, Refills: 2      CONTINUE these medications which have CHANGED   Details  gabapentin (NEURONTIN) 300 MG capsule Take 2 capsules (600 mg total) by mouth 2 (two) times daily. Qty: 30 capsule, Refills: 2      CONTINUE these  medications which have NOT CHANGED   Details  albuterol (PROVENTIL HFA;VENTOLIN HFA) 108 (90 BASE) MCG/ACT inhaler Inhale 2 puffs into the lungs every 4 (four) hours as needed for wheezing or shortness of breath. Qty: 6.7 g, Refills: 0   Associated Diagnoses: Bronchogenic cancer of right lung (HCC)    eszopiclone (LUNESTA) 2 MG TABS tablet Take 1 tablet (2 mg total) by mouth at bedtime as needed for sleep. Take immediately before bedtime Qty: 30 tablet, Refills: 5   Associated Diagnoses: Secondary malignant neoplasm of brain and spinal cord (HCC)    SYMBICORT 80-4.5 MCG/ACT inhaler INHALE TWO PUFFS INTO  LUNGS AS NEEDED Qty: 2 Inhaler, Refills: 5    benzonatate (TESSALON) 100 MG capsule Take 1 capsule (100 mg total) by mouth every 8 (eight) hours. Qty: 21 capsule, Refills: 0    diclofenac sodium (VOLTAREN) 1 % GEL Apply 2 g topically 4 (four) times daily. Qty: 100 g, Refills: 5   Associated Diagnoses: DDD (degenerative disc disease), lumbar; Secondary malignant neoplasm of brain and spinal cord ([place]); AAA (abdominal aortic aneurysm) without rupture (HCC)      STOP taking these medications     ibuprofen (ADVIL,MOTRIN) 200 MG tablet        Allergies  Allergen Reactions  . Carboplatin Itching  . Codeine Nausea And Vomiting  . Sulfa Antibiotics   . Dilaudid [Hydromorphone Hcl] Nausea And Vomiting  . Penicillins Itching and Rash      The results of significant diagnostics from this hospitalization (including imaging, microbiology, ancillary and laboratory) are listed below for reference.    Significant Diagnostic Studies: Ct Angio Chest Pe W/cm &/or Wo Cm  03/26/2015   CLINICAL DATA:  Acute onset of right flank pain and chronic cough. Decreased O2 saturation. Leukocytosis. Known metastatic lung cancer to the brain. Initial encounter.  EXAM: CT ANGIOGRAPHY CHEST WITH CONTRAST  TECHNIQUE: Multidetector CT imaging of the chest was performed using the standard protocol during bolus administration of intravenous contrast. Multiplanar CT image reconstructions and MIPs were obtained to evaluate the vascular anatomy.  CONTRAST:  100 mL of Omnipaque 350 IV contrast  COMPARISON:  CT of the chest performed 12/11/2014  FINDINGS: There is no evidence of pulmonary embolus.  Focal right basilar airspace opacity is similar in appearance to the prior study and may simply reflect postradiation change, though recurrent disease again cannot be excluded. PET/CT could be considered for further evaluation. Airspace opacification at the left hilum likely reflects postradiation change. Underlying emphysematous  change is noted bilaterally, more prominent at the upper lung lobes. There is no evidence of pleural effusion or pneumothorax. No masses are identified; no abnormal focal contrast enhancement is seen.  Scattered mildly prominent right hilar and azygoesophageal recess nodes are seen, measuring up to 1.2 cm in short axis. A moderate pericardial effusion is noted. Scattered calcification is noted along the aortic arch, with mild mural thrombus at the right innominate artery. The great vessels are otherwise grossly unremarkable. No axillary lymphadenopathy is seen. The thyroid gland is unremarkable in appearance. A right-sided chest port is noted.  The visualized portions of the liver and spleen are unremarkable, aside from mild scarring and calcification along the periphery of the spleen.  No acute osseous abnormalities are seen.  Review of the MIP images confirms the above findings.  IMPRESSION: 1. No evidence of pulmonary embolus. 2. Focal right basilar airspace opacity is similar in appearance to the prior study and may simply reflect postradiation change, though worsened from 2015. Recurrent  disease again cannot be excluded. PET/CT would be helpful for further evaluation, as deemed clinically appropriate. 3. Airspace opacification of the left hilum likely reflects postradiation change. 4. Mildly prominent right hilar and azygoesophageal recess nodes noted, similar and nonspecific in appearance. 5. Moderate pericardial effusion again noted. 6. Mild mural thrombus again noted at the right innominate artery.   Electronically Signed   By: Garald Balding M.D.   On: 03/26/2015 01:31    Microbiology: Recent Results (from the past 240 hour(s))  MRSA PCR Screening     Status: None   Collection Time: 03/26/15  5:56 AM  Result Value Ref Range Status   MRSA by PCR NEGATIVE NEGATIVE Final    Comment:        The GeneXpert MRSA Assay (FDA approved for NASAL specimens only), is one component of a comprehensive MRSA  colonization surveillance program. It is not intended to diagnose MRSA infection nor to guide or monitor treatment for MRSA infections.      Labs: Basic Metabolic Panel:  Recent Labs Lab 03/25/15 2341 03/27/15 0540  NA 144 140  K 3.8 3.9  CL 106 105  CO2  --  26  GLUCOSE 102* 95  BUN 19 20  CREATININE 0.80 0.72  CALCIUM  --  9.2   Liver Function Tests: No results for input(s): AST, ALT, ALKPHOS, BILITOT, PROT, ALBUMIN in the last 168 hours. No results for input(s): LIPASE, AMYLASE in the last 168 hours. No results for input(s): AMMONIA in the last 168 hours. CBC:  Recent Labs Lab 03/25/15 2325 03/25/15 2341 03/26/15 0506 03/27/15 0540 03/28/15 0540  WBC 12.3*  --  8.2 14.7* 8.8  NEUTROABS 7.8*  --   --   --   --   HGB 16.0* 17.3* 15.4* 14.8 15.7*  HCT 47.2* 51.0* 44.8 43.8 45.7  MCV 94.4  --  92.9 93.4 94.0  PLT 198  --  149* 156 148*       Signed:  Katrisha Segall S  [facility] 03/28/2015, 3:25 PM

## 2015-03-28 NOTE — Care Management Note (Signed)
Case Management Note  Patient Details  Name: Courtney West MRN: [id number] Date of Birth: Nov 15, 1950  Subjective/Objective: PT-no f/u. [facility] chosen for [facility].rep Cyril Mourning  Aware of [place], [place] social worker order, & d/c today. [facility] dme rep Pura Spice aware of qualifying 02 sats in progress notes.MD ordered home 02 2 l continuous.[facility] dme rep will bring 02 tank to rm prior d/c.                 Action/Plan:d/c home w/HHC/DME.   Expected Discharge Date:                  Expected Discharge Plan:  [place]  In-House Referral:  NA, Clinical Social Work  Discharge planning Services  CM Consult  Post Acute Care Choice:  NA Choice offered to:  NA, Patient  DME Arranged:  Oxygen DME Agency:  [place]:  RN, Social Work [employer] Agency:  [place]  Status of Service:  Completed, signed off  Medicare Important Message Given:  Yes-second notification given Date Medicare IM Given:    Medicare IM give by:    Date Additional Medicare IM Given:    Additional Medicare Important Message give by:     If discussed at [place] of Stay Meetings, dates discussed:    Additional Comments:  Dessa Phi, RN 03/28/2015, 2:57 PM

## 2015-03-28 NOTE — Evaluation (Signed)
Physical Therapy Evaluation Patient Details Name: Courtney West MRN: [id number] DOB: [birth year] Today's Date: 03/28/2015   History of Present Illness  64 year old with h/o metastatic non small cell cancer, s/p chemo, radiation treatments to the brain and back presents with sob and back pain.she was found to have mural thrombus and a left pleural effusion. She was started on IV heparin and admitted to stepdown on 10/2  Clinical Impression  Patient is Independent  In mobility. Does require Oxygen to MAINTAIN STAS > 88% NO FURTHER PT AT THIS TIME.   Follow Up Recommendations No PT follow up    Equipment Recommendations  None recommended by PT (except patient requests small portable  O2 tank.)    Recommendations for Other Services       Precautions / Restrictions Precautions Precaution Comments: on O2      Mobility  Bed Mobility Overal bed mobility: Independent                Transfers Overall transfer level: Independent                  Ambulation/Gait Ambulation/Gait assistance: Independent Ambulation Distance (Feet): 400 Feet Assistive device: None Gait Pattern/deviations: WFL(Within Functional Limits)     General Gait Details: ambulated x 400 on 2 l, sats 935, ambulated a160' on RA with sats 83%  Stairs            Wheelchair Mobility    Modified Rankin (Stroke Patients Only)       Balance                                             Pertinent Vitals/Pain Pain Assessment: No/denies pain    Home Living Family/patient expects to be discharged to:: Private residence Living Arrangements: Children;Other relatives Available Help at Discharge: Family Type of Home: House Home Access: Stairs to enter Entrance Stairs-Rails: Right Entrance Stairs-Number of Steps: 3 Home Layout: One level Home Equipment: None      Prior Function Level of Independence: Independent               Hand Dominance         Extremity/Trunk Assessment   Upper Extremity Assessment: Overall WFL for tasks assessed           Lower Extremity Assessment: Overall WFL for tasks assessed      Cervical / Trunk Assessment: Normal  Communication   Communication: No difficulties  Cognition Arousal/Alertness: Awake/alert Behavior During Therapy: WFL for tasks assessed/performed Overall Cognitive Status: Within Functional Limits for tasks assessed                      General Comments      Exercises        Assessment/Plan    PT Assessment [place] does not need any further PT services  PT Diagnosis     PT Problem List    PT Treatment Interventions     PT Goals (Current goals can be found in the Care Plan section) Acute Rehab PT Goals PT Goal Formulation: All assessment and education complete, DC therapy    Frequency     Barriers to discharge        Co-evaluation               End of Session   Activity Tolerance: Patient tolerated treatment well  Patient left: in bed;with call bell/phone within reach;with bed alarm set Nurse Communication: Mobility status         Time: 1355-1430 PT Time Calculation (min) (ACUTE ONLY): 35 min   Charges:   PT Evaluation $Initial PT Evaluation Tier I: 1 Procedure PT Treatments $Gait Training: 8-22 mins   PT G Codes:        Claretha Cooper 03/28/2015, 2:40 PM Tresa Endo PT [phone]

## 2015-03-28 NOTE — Telephone Encounter (Signed)
Call received from Dessa Phi, RN CM requesting a hospital follow up appointment for the patient as she has no PCP.   As per Rolanda Lundborg, [facility] scheduler, the patient has [facility] Medicare and has a PCP assigned - Shirline Frees, MD # [phone], and the [facility] can't schedule an appt for her unless she contacts her insurance company and changes her PCP. Update provided to K. Mahabir, RN CM.  Message then received from Jerline Pain, RN CM that the patient has been dismissed from Dr Kenton Kingfisher' practice and has no PCP.  A subsequent message was received from Jerline Pain, RN CM that the patient will  be following up with her oncologist.

## 2015-03-28 NOTE — Care Management Important Message (Signed)
Important Message  Patient Details  Name: Courtney West MRN: [id number] Date of Birth: 04/07/1951   Medicare Important Message Given:  Yes-second notification given    Camillo Flaming 03/28/2015, 11:33 AMImportant Message  Patient Details  Name: Courtney West MRN: [id number] Date of Birth: 11/18/1950   Medicare Important Message Given:  Yes-second notification given    Camillo Flaming 03/28/2015, 11:33 AM

## 2015-03-28 NOTE — Care Management Note (Signed)
Case Management Note  Patient Details  Name: CHASITIE PASSEY MRN: [id number] Date of Birth: 1951/03/28  Subjective/Objective: [facility] chosen for [facility] rep Kristen aware & following.Await HHC orders, & PT eval.                   Action/Plan:   Expected Discharge Date:                  Expected Discharge Plan:  [place]  In-House Referral:  NA, Clinical Social Work  Discharge planning Services  CM Consult  Post Acute Care Choice:  NA Choice offered to:  NA, Patient  DME Arranged:    DME Agency:     HH Arranged:    [place] Agency:     Status of Service:  In process, will continue to follow  Medicare Important Message Given:  Yes-second notification given Date Medicare IM Given:    Medicare IM give by:    Date Additional Medicare IM Given:    Additional Medicare Important Message give by:     If discussed at [place] of Stay Meetings, dates discussed:    Additional Comments:  Dessa Phi, RN 03/28/2015, 1:10 PM

## 2015-03-28 NOTE — Care Management Note (Signed)
Case Management Note  Patient Details  Name: Courtney West MRN: [id number] Date of Birth: 1951/01/02  Subjective/Objective:    [place] unable to set up pcp appt, & Dr. Shirline Frees office has dismissed patient.Patient is agreeable to use her xrt oncologist/oncologist-Dr. Manning/Mohamed as her pcp.[facility] rep Kristen aware.[facility] dme rep aware of home neb machine needed.               Action/Plan:d/c home w/HHC/DME.  Expected Discharge Date:                  Expected Discharge Plan:  [place]  In-House Referral:  NA, Clinical Social Work  Discharge planning Services  CM Consult  Post Acute Care Choice:  NA Choice offered to:  NA, Patient  DME Arranged:  Oxygen DME Agency:  [place]:  RN, Social Work [employer] Agency:  [place]  Status of Service:  Completed, signed off  Medicare Important Message Given:  Yes-second notification given Date Medicare IM Given:    Medicare IM give by:    Date Additional Medicare IM Given:    Additional Medicare Important Message give by:     If discussed at [place] of Stay Meetings, dates discussed:    Additional Comments:  Dessa Phi, RN 03/28/2015, 3:29 PM

## 2015-03-28 NOTE — Care Management Note (Signed)
Case Management Note  Patient Details  Name: NAELLE DIEGEL MRN: [id number] Date of Birth: 06/06/1951  Subjective/Objective: Spoke to patient about concerns that home is condemned.Patient states her home is condemned but she will stay w/dtr-Faye Tamala Julian c#[phone], address:[address],  (right next door to her home address)She does not want [place] to contact her nephew(Raymond)she states "I told him stay out of my business". She is in agreement to [place] social worker-home concerns. Await PT eval.                   Action/Plan:d/c plan home w/HHC.   Expected Discharge Date:                  Expected Discharge Plan:  [place]  In-House Referral:  NA, Clinical Social Work  Discharge planning Services  CM Consult  Post Acute Care Choice:  NA Choice offered to:  NA, Patient  DME Arranged:    DME Agency:     HH Arranged:    [place] Agency:     Status of Service:  In process, will continue to follow  Medicare Important Message Given:  Yes-second notification given Date Medicare IM Given:    Medicare IM give by:    Date Additional Medicare IM Given:    Additional Medicare Important Message give by:     If discussed at [place] of Stay Meetings, dates discussed:    Additional Comments:  Dessa Phi, RN 03/28/2015, 12:52 PM

## 2015-03-28 NOTE — Progress Notes (Signed)
ANTICOAGULATION CONSULT NOTE - Follow Up Consult  Pharmacy Consult for Heparin, Xarelto Indication: Mural Thrombus  Allergies  Allergen Reactions  . Carboplatin Itching  . Codeine Nausea And Vomiting  . Sulfa Antibiotics   . Dilaudid [Hydromorphone Hcl] Nausea And Vomiting  . Penicillins Itching and Rash    Patient Measurements: Height: '5\' 3"'$  (160 cm) Weight: 182 lb 12.2 oz (82.9 kg) IBW/kg (Calculated) : 52.4 Heparin Dosing Weight: 67 kg  Vital Signs: Temp: 97.4 F (36.3 C) (10/05 0500) Temp Source: Oral (10/05 0500) BP: 96/58 mmHg (10/05 0500) Pulse Rate: 55 (10/05 0500)  Labs:  Recent Labs  03/25/15 2341 03/26/15 0506  03/26/15 2226 03/27/15 0540 03/27/15 0550 03/28/15 0540  HGB 17.3* 15.4*  --   --  14.8  --  15.7*  HCT 51.0* 44.8  --   --  43.8  --  45.7  PLT  --  149*  --   --  156  --  148*  HEPARINUNFRC  --   --   < > 0.58  --  0.59 0.50  CREATININE 0.80  --   --   --  0.72  --   --   < > = values in this interval not displayed.  Estimated Creatinine Clearance: 73.4 mL/min (by C-G formula based on Cr of 0.72).   Medications:  Infusions:  . heparin 1,400 Units/hr (03/28/15 2751)   Assessment: 64 y.o. Female, history of metastatic Lung Cancer to the Brain S/P chemo and radiation. Presents to the ED with complaints of increased SOB and chest tightness, along with 10/10 right sided back pain. CTA of the chest revealed airspace disease, a mural thrombus, and a left pleural effusion. Pharmacy was initially consulted to dose heparin, now consulted to dose Xarelto.  Today, 03/28/2015:  Heparin level 0.5, therapeutic  CBC: Hgb increased 15.7, Plt stable 148  SrCr 0.72, CrCl 73.4  Pt reports minor bruising around IV sites.  Goal of Therapy:  Heparin level 0.3-0.7 units/ml Monitor platelets by anticoagulation protocol: Yes   Plan:  D/C Heparin at 1200 Start Xarelto '15mg'$  BID with meals at 1200 for 21 days, then '20mg'$  once daily with food. F/u renal  function and CBC Pharmacy to provide education prior to d/c  [place], Areal Cochrane 03/28/2015,8:55 AM

## 2015-03-28 NOTE — Care Management Note (Signed)
Case Management Note  Patient Details  Name: JEZABELLE CHISOLM MRN: [id number] Date of Birth: 15-Apr-1951  Subjective/Objective:   pcp appt w/[facility] will set up, & put in d/c section.Patient voiced understanding & agreed.                 Action/Plan:d/c home w/HHC/DME.   Expected Discharge Date:                  Expected Discharge Plan:  [place]  In-House Referral:  NA, Clinical Social Work  Discharge planning Services  CM Consult  Post Acute Care Choice:  NA Choice offered to:  NA, Patient  DME Arranged:  Oxygen DME Agency:  [place]:  RN, Social Work [employer] Agency:  [place]  Status of Service:  Completed, signed off  Medicare Important Message Given:  Yes-second notification given Date Medicare IM Given:    Medicare IM give by:    Date Additional Medicare IM Given:    Additional Medicare Important Message give by:     If discussed at [place] of Stay Meetings, dates discussed:    Additional Comments:  Dessa Phi, RN 03/28/2015, 3:13 PM

## 2015-03-28 NOTE — Progress Notes (Signed)
SATURATION QUALIFICATIONS: (This note is used to comply with regulatory documentation for home oxygen)  Patient Saturations on Room Air at Rest = 93%  Patient Saturations on Room Air while Ambulating =83%  Patient Saturations on 2  Liters of oxygen while Ambulating = 95%  Please briefly explain why patient needs home oxygen:desaturation while ambulating on RA-83%. Ambulation on 2 l is 95% Tresa Endo PT [phone]

## 2015-03-28 NOTE — Discharge Instructions (Signed)
Information on my medicine - XARELTO (rivaroxaban)  This medication education was reviewed with me or my healthcare representative as part of my discharge preparation.  The pharmacist that spoke with me during my hospital stay was:  [place]? Xarelto was prescribed to treat blood clots that may have been found in the veins of your legs (deep vein thrombosis) or in your lungs (pulmonary embolism) and to reduce the risk of them occurring again.  What do you need to know about Xarelto? The starting dose is one 15 mg tablet taken TWICE daily with food for the FIRST 21 DAYS then on (enter date)  04/18/15  the dose is changed to one 20 mg tablet taken ONCE A DAY with your evening meal.  DO NOT stop taking Xarelto without talking to the health care provider who prescribed the medication.  Refill your prescription for 20 mg tablets before you run out.  After discharge, you should have regular check-up appointments with your healthcare provider that is prescribing your Xarelto.  In the future your dose may need to be changed if your kidney function changes by a significant amount.  What do you do if you miss a dose? If you are taking Xarelto TWICE DAILY and you miss a dose, take it as soon as you remember. You may take two 15 mg tablets (total 30 mg) at the same time then resume your regularly scheduled 15 mg twice daily the next day.  If you are taking Xarelto ONCE DAILY and you miss a dose, take it as soon as you remember on the same day then continue your regularly scheduled once daily regimen the next day. Do not take two doses of Xarelto at the same time.   Important Safety Information Xarelto is a blood thinner medicine that can cause bleeding. You should call your healthcare provider right away if you experience any of the following: ? Bleeding from an injury or your nose that does not stop. ? Unusual colored urine (red or dark brown) or unusual  colored stools (red or black). ? Unusual bruising for unknown reasons. ? A serious fall or if you hit your head (even if there is no bleeding).  Some medicines may interact with Xarelto and might increase your risk of bleeding while on Xarelto. To help avoid this, consult your healthcare provider or pharmacist prior to using any new prescription or non-prescription medications, including herbals, vitamins, non-steroidal anti-inflammatory drugs (NSAIDs) and supplements.  This website has more information on Xarelto: [email or web address].[email or web address]

## 2015-04-03 ENCOUNTER — Encounter (HOSPITAL_COMMUNITY): Payer: Self-pay | Admitting: *Deleted

## 2015-04-03 ENCOUNTER — Emergency Department (HOSPITAL_COMMUNITY)
Admission: EM | Admit: 2015-04-03 | Discharge: 2015-04-04 | Disposition: A | Discharge status: Home / Self Care | Payer: Medicare Other | Attending: Emergency Medicine | Admitting: Emergency Medicine

## 2015-04-03 DIAGNOSIS — Z720 Tobacco use: Secondary | ICD-10-CM | POA: Diagnosis not present

## 2015-04-03 DIAGNOSIS — J449 Chronic obstructive pulmonary disease, unspecified: Secondary | ICD-10-CM | POA: Insufficient documentation

## 2015-04-03 DIAGNOSIS — Z8673 Personal history of transient ischemic attack (TIA), and cerebral infarction without residual deficits: Secondary | ICD-10-CM | POA: Diagnosis not present

## 2015-04-03 DIAGNOSIS — I10 Essential (primary) hypertension: Secondary | ICD-10-CM | POA: Insufficient documentation

## 2015-04-03 DIAGNOSIS — Z79899 Other long term (current) drug therapy: Secondary | ICD-10-CM | POA: Insufficient documentation

## 2015-04-03 DIAGNOSIS — M546 Pain in thoracic spine: Secondary | ICD-10-CM | POA: Diagnosis present

## 2015-04-03 DIAGNOSIS — Z8639 Personal history of other endocrine, nutritional and metabolic disease: Secondary | ICD-10-CM | POA: Diagnosis not present

## 2015-04-03 DIAGNOSIS — Z880 Allergy status to penicillin: Secondary | ICD-10-CM | POA: Insufficient documentation

## 2015-04-03 DIAGNOSIS — Z85118 Personal history of other malignant neoplasm of bronchus and lung: Secondary | ICD-10-CM | POA: Insufficient documentation

## 2015-04-03 DIAGNOSIS — Z85841 Personal history of malignant neoplasm of brain: Secondary | ICD-10-CM | POA: Insufficient documentation

## 2015-04-03 NOTE — ED Notes (Signed)
Pt states that she is having rt mid back pain that began last Sunday; pt states that was seen her and diagnosed with a blood clot and then didn't treat her back pain; pt states that she is still in the same pain she came in before with

## 2015-04-04 ENCOUNTER — Encounter: Payer: Self-pay | Admitting: Skilled Nursing Facility1

## 2015-04-04 DIAGNOSIS — M546 Pain in thoracic spine: Secondary | ICD-10-CM | POA: Diagnosis not present

## 2015-04-04 MED ORDER — METHOCARBAMOL 500 MG PO TABS
500.0000 mg | ORAL_TABLET | Freq: Once | ORAL | Status: AC
  Administered 2015-04-04: 500 mg via ORAL
  Filled 2015-04-04: qty 1

## 2015-04-04 MED ORDER — METHOCARBAMOL 500 MG PO TABS: 500.0000 mg | ORAL_TABLET | Freq: Two times a day (BID) | ORAL | Status: DC

## 2015-04-04 NOTE — ED Notes (Signed)
Pt is resting quietly in NAD

## 2015-04-04 NOTE — Progress Notes (Signed)
Subjective:     Patient ID: Courtney West, female   DOB: 1951-01-28, 64 y.o.   MRN: [id number]  HPI   Review of Systems     Objective:   Physical Exam To assist the pt in identifying dietary strategies to gain lost weight back.     Assessment:     Pt identified as being malnourished due to losing some weight. Pt contacted via the telephone at [phone] but it was disconnected. [phone] was called but the pt was unavailable.     Plan:     Dietitian left a voicemail prompting the pt to contact Courtney West at [phone].

## 2015-04-04 NOTE — ED Notes (Signed)
Pt continues to sleep,  Lying on left side,  snoring

## 2015-04-04 NOTE — ED Provider Notes (Signed)
CSN: [id number]     Arrival date & time 04/03/15  2336 History   First MD Initiated Contact with Patient 04/04/15 0232     Chief Complaint  Patient presents with  . Back Pain     (Consider location/radiation/quality/duration/timing/severity/associated sxs/prior Treatment) Patient is a 64 y.o. female presenting with back pain. The history is provided by the patient. No language interpreter was used.  Back Pain Location:  Thoracic spine Associated symptoms: no fever, no headaches and no numbness   Associated symptoms comment:  The patient presents with complaint of back pain in the thoracic back without known injury or trauma. Pain has been present for the past 2 weeks. She was seen in the emergency department at that time but was admitted with another diagnosis and feels the cause of her back pain was not assessed. She is quite somnolent in the emergency department now and history is difficult to ascertain in detail.    Past Medical History  Diagnosis Date  . Hypertension   . COPD (chronic obstructive pulmonary disease) ([place])   . Stroke ([place])   . Abdominal aortic aneurysm ([place])   . Dyslipidemia   . S/P radiation therapy 7/12 thru 9/12, 04/18/11    xrt to brain mets  . Lung cancer ([place]) 12/2010  . Brain cancer ([place])     mets from lung primary  . Allergy     codeine,pcn,sulfa drugs  . Radiation 10/20/2011    frontal mets/Palliation  . History of radiation therapy 01/19/12, 01/21/12, 01/26/12    RLL lung  . History of radiation therapy 04/18/11    single fraction to 6 brain mets  . Clotted vascular catheter ([place]) 03/30/2012  . Lung cancer ([place]) 12/22/2010   Past Surgical History  Procedure Laterality Date  . Carpal tunnel release    . Left knee surgery    . Cholecystectomy    . Partial hysterectomy     Family History  Problem Relation Age of Onset  . Cancer Father     lung, leukemia  . Cancer Maternal Aunt     kidney   Social History  Substance Use Topics  . Smoking  status: Current Every Day Smoker -- 1.00 packs/day for 50 years    Types: Cigarettes  . Smokeless tobacco: None  . Alcohol Use: No   OB History    No data available     Review of Systems  Constitutional: Negative for fever and chills.  Respiratory: Negative.   Cardiovascular: Negative.   Gastrointestinal: Negative.  Negative for nausea.  Musculoskeletal: Positive for back pain.  Skin: Negative.   Neurological: Negative.  Negative for numbness and headaches.      Allergies  Sulfa antibiotics; Carboplatin; Codeine; Dilaudid; and Penicillins  Home Medications   Prior to Admission medications   Medication Sig Start Date End Date Taking? Authorizing Provider  albuterol (PROVENTIL HFA;VENTOLIN HFA) 108 (90 BASE) MCG/ACT inhaler Inhale 2 puffs into the lungs every 4 (four) hours as needed for wheezing or shortness of breath. 12/20/14  Yes Tyler Pita, MD  eszopiclone (LUNESTA) 2 MG TABS tablet Take 1 tablet (2 mg total) by mouth at bedtime as needed for sleep. Take immediately before bedtime 12/20/14  Yes Tyler Pita, MD  gabapentin (NEURONTIN) 300 MG capsule Take 2 capsules (600 mg total) by mouth 2 (two) times daily. 03/28/15  Yes Oswald Hillock, MD  ipratropium-albuterol (DUONEB) 0.5-2.5 (3) MG/3ML SOLN Take 3 mLs by nebulization every 6 (six) hours as needed. 03/28/15  Yes Frederich Chick  Toney Sang, MD  levofloxacin (LEVAQUIN) 750 MG tablet Take 1 tablet (750 mg total) by mouth daily. 03/28/15  Yes Oswald Hillock, MD  Rivaroxaban (XARELTO STARTER PACK) 15 & 20 MG TBPK Take as directed on package: Start with one '15mg'$  tablet by mouth twice a day with food. On Day 22, switch to one '20mg'$  tablet once a day with food. 03/28/15  Yes Oswald Hillock, MD  SYMBICORT 80-4.5 MCG/ACT inhaler INHALE TWO PUFFS INTO LUNGS AS NEEDED Patient taking differently: INHALE TWO PUFFS INTO LUNGS AS NEEDED for shortness of breath 06/30/14  Yes Tyler Pita, MD  diclofenac sodium (VOLTAREN) 1 % GEL Apply 2 g topically 4 (four)  times daily. Patient not taking: Reported on 12/20/2014 11/23/14   Tyler Pita, MD  predniSONE (DELTASONE) 10 MG tablet Prednisone 40 mg po daily x 1 day then Prednisone 30 mg po daily x 1 day then Prednisone 20 mg po daily x 1 day then Prednisone 10 mg daily x 1 day then stop... 03/28/15   Oswald Hillock, MD  rivaroxaban (XARELTO) 20 MG TABS tablet Take 1 tablet (20 mg total) by mouth daily with supper. 04/17/15   Oswald Hillock, MD   BP 104/56 mmHg  Pulse 72  Temp(Src) 98.6 F (37 C) (Oral)  Resp 19  SpO2 93% Physical Exam  Constitutional: She is oriented to person, place, and time. She appears well-developed and well-nourished.  Neck: Normal range of motion.  Pulmonary/Chest: Effort normal. She has no wheezes. She has no rales.  Abdominal: Soft.  Musculoskeletal:  Tender across thoracic mid-back without swelling.   Neurological: She is alert and oriented to person, place, and time.  Skin: Skin is warm and dry.    ED Course  Procedures (including critical care time) Labs Review Labs Reviewed - No data to display  Imaging Review No results found. I have personally reviewed and evaluated these images and lab results as part of my medical decision-making.   EKG Interpretation None      MDM   Final diagnoses:  None    1. Thoracic back pain  The patient has been able to sleep most of the night without difficulty. VSS, no pulmonary issues. She can be discharged home with muscle relaxers to help with back pain. PCP follow up is encouraged. She states Shirline Frees is no longer her physician. Resources provided.    Charlann Lange, PA-C 04/04/15 1102  Everlene Balls, MD 04/04/15 [phone]

## 2015-04-04 NOTE — Discharge Instructions (Signed)
Back Pain, Adult °Back pain is very common in adults. The cause of back pain is rarely dangerous and the pain often gets better over time. The cause of your back pain may not be known. Some common causes of back pain include: °· Strain of the muscles or ligaments supporting the spine. °· Wear and tear (degeneration) of the spinal disks. °· Arthritis. °· Direct injury to the back. °For many people, back pain may return. Since back pain is rarely dangerous, most people can learn to manage this condition on their own. °HOME CARE INSTRUCTIONS °Watch your back pain for any changes. The following actions may help to lessen any discomfort you are feeling: °· Remain active. It is stressful on your back to sit or stand in one place for long periods of time. Do not sit, drive, or stand in one place for more than 30 minutes at a time. Take short walks on even surfaces as soon as you are able. Try to increase the length of time you walk each day. °· Exercise regularly as directed by your health care provider. Exercise helps your back heal faster. It also helps avoid future injury by keeping your muscles strong and flexible. °· Do not stay in bed. Resting more than 1-2 days can delay your recovery. °· Pay attention to your body when you bend and lift. The most comfortable positions are those that put less stress on your recovering back. Always use proper lifting techniques, including: °· Bending your knees. °· Keeping the load close to your body. °· Avoiding twisting. °· Find a comfortable position to sleep. Use a firm mattress and lie on your side with your knees slightly bent. If you lie on your back, put a pillow under your knees. °· Avoid feeling anxious or stressed. Stress increases muscle tension and can worsen back pain. It is important to recognize when you are anxious or stressed and learn ways to manage it, such as with exercise. °· Take medicines only as directed by your health care provider. Over-the-counter  medicines to reduce pain and inflammation are often the most helpful. Your health care provider may prescribe muscle relaxant drugs. These medicines help dull your pain so you can more quickly return to your normal activities and healthy exercise. °· Apply ice to the injured area: °· Put ice in a plastic bag. °· Place a towel between your skin and the bag. °· Leave the ice on for 20 minutes, 2-3 times a day for the first 2-3 days. After that, ice and heat may be alternated to reduce pain and spasms. °· Maintain a healthy weight. Excess weight puts extra stress on your back and makes it difficult to maintain good posture. °SEEK MEDICAL CARE IF: °· You have pain that is not relieved with rest or medicine. °· You have increasing pain going down into the legs or buttocks. °· You have pain that does not improve in one week. °· You have night pain. °· You lose weight. °· You have a fever or chills. °SEEK IMMEDIATE MEDICAL CARE IF:  °· You develop new bowel or bladder control problems. °· You have unusual weakness or numbness in your arms or legs. °· You develop nausea or vomiting. °· You develop abdominal pain. °· You feel faint. °  °This information is not intended to replace advice given to you by your health care provider. Make sure you discuss any questions you have with your health care provider. °  °Document Released: 06/09/2005 Document Revised: 06/30/2014 Document Reviewed: 10/11/2013 °Elsevier Interactive Patient Education ©2016 [employer] Heat Therapy Heat therapy can help ease sore, stiff, injured, and tight muscles and joints. Heat relaxes your muscles, which may help ease your pain.  RISKS AND COMPLICATIONS If you have any of the following conditions, do not use heat therapy unless your health care provider has approved:  Poor circulation.  Healing wounds or scarred skin in the area being treated.  Diabetes, heart disease, or high blood pressure.  Not being able to feel (numbness) the area  being treated.  Unusual swelling of the area being treated.  Active infections.  Blood clots.  Cancer.  Inability to communicate pain. This may include young children and people who have problems with their brain function (dementia).  Pregnancy. Heat therapy should only be used on old, pre-existing, or long-lasting (chronic) injuries. Do not use heat therapy on new injuries unless directed by your health care provider. HOW TO USE HEAT THERAPY There are several different kinds of heat therapy, including:  Moist heat pack.  Warm water bath.  Hot water bottle.  Electric heating pad.  Heated gel pack.  Heated wrap.  Electric heating pad. Use the heat therapy method suggested by your health care provider. Follow your health care provider's instructions on when and how to use heat therapy. GENERAL HEAT THERAPY RECOMMENDATIONS  Do not sleep while using heat therapy. Only use heat therapy while you are awake.  Your skin may turn pink while using heat therapy. Do not use heat therapy if your skin turns red.  Do not use heat therapy if you have new pain.  High heat or long exposure to heat can cause burns. Be careful when using heat therapy to avoid burning your skin.  Do not use heat therapy on areas of your skin that are already irritated, such as with a rash or sunburn. SEEK MEDICAL CARE IF:  You have blisters, redness, swelling, or numbness.  You have new pain.  Your pain is worse. MAKE SURE YOU:  Understand these instructions.  Will watch your condition.  Will get help right away if you are not doing well or get worse.   This information is not intended to replace advice given to you by your health care provider. Make sure you discuss any questions you have with your health care provider.   Document Released: 09/01/2011 Document Revised: 06/30/2014 Document Reviewed: 08/02/2013 Elsevier Interactive Patient Education [employer].

## 2015-04-05 ENCOUNTER — Encounter: Payer: Self-pay | Admitting: Internal Medicine

## 2015-04-05 NOTE — Progress Notes (Signed)
I faxed form --karyn g [phone]---home health svc referred

## 2015-04-14 ENCOUNTER — Emergency Department (HOSPITAL_COMMUNITY)
Admission: EM | Admit: 2015-04-14 | Discharge: 2015-04-15 | Disposition: A | Discharge status: Home / Self Care | Payer: Medicare Other | Attending: Emergency Medicine | Admitting: Emergency Medicine

## 2015-04-14 ENCOUNTER — Emergency Department (HOSPITAL_COMMUNITY): Payer: Medicare Other

## 2015-04-14 ENCOUNTER — Encounter (HOSPITAL_COMMUNITY): Payer: Self-pay | Admitting: *Deleted

## 2015-04-14 DIAGNOSIS — J441 Chronic obstructive pulmonary disease with (acute) exacerbation: Secondary | ICD-10-CM | POA: Diagnosis not present

## 2015-04-14 DIAGNOSIS — I10 Essential (primary) hypertension: Secondary | ICD-10-CM | POA: Diagnosis not present

## 2015-04-14 DIAGNOSIS — F17210 Nicotine dependence, cigarettes, uncomplicated: Secondary | ICD-10-CM | POA: Diagnosis not present

## 2015-04-14 DIAGNOSIS — Z8673 Personal history of transient ischemic attack (TIA), and cerebral infarction without residual deficits: Secondary | ICD-10-CM | POA: Insufficient documentation

## 2015-04-14 DIAGNOSIS — Z85841 Personal history of malignant neoplasm of brain: Secondary | ICD-10-CM | POA: Insufficient documentation

## 2015-04-14 DIAGNOSIS — Z85118 Personal history of other malignant neoplasm of bronchus and lung: Secondary | ICD-10-CM | POA: Diagnosis not present

## 2015-04-14 DIAGNOSIS — J189 Pneumonia, unspecified organism: Secondary | ICD-10-CM

## 2015-04-14 DIAGNOSIS — J159 Unspecified bacterial pneumonia: Secondary | ICD-10-CM | POA: Diagnosis not present

## 2015-04-14 DIAGNOSIS — Z8639 Personal history of other endocrine, nutritional and metabolic disease: Secondary | ICD-10-CM | POA: Insufficient documentation

## 2015-04-14 DIAGNOSIS — Z79899 Other long term (current) drug therapy: Secondary | ICD-10-CM | POA: Insufficient documentation

## 2015-04-14 DIAGNOSIS — R0600 Dyspnea, unspecified: Secondary | ICD-10-CM

## 2015-04-14 DIAGNOSIS — R0602 Shortness of breath: Secondary | ICD-10-CM | POA: Diagnosis present

## 2015-04-14 DIAGNOSIS — Z880 Allergy status to penicillin: Secondary | ICD-10-CM | POA: Insufficient documentation

## 2015-04-14 DIAGNOSIS — M546 Pain in thoracic spine: Secondary | ICD-10-CM | POA: Diagnosis not present

## 2015-04-14 DIAGNOSIS — Z7901 Long term (current) use of anticoagulants: Secondary | ICD-10-CM | POA: Diagnosis not present

## 2015-04-14 DIAGNOSIS — R600 Localized edema: Secondary | ICD-10-CM | POA: Diagnosis not present

## 2015-04-14 LAB — PROTIME-INR
INR: 1.6 — ABNORMAL HIGH (ref 0.00–1.49)
Prothrombin Time: 19.1 seconds — ABNORMAL HIGH (ref 11.6–15.2)

## 2015-04-14 LAB — COMPREHENSIVE METABOLIC PANEL
ALK PHOS: 74 U/L (ref 38–126)
ALT: 16 U/L (ref 14–54)
AST: 16 U/L (ref 15–41)
Albumin: 3.1 g/dL — ABNORMAL LOW (ref 3.5–5.0)
Anion gap: 10 (ref 5–15)
BILIRUBIN TOTAL: 0.5 mg/dL (ref 0.3–1.2)
BUN: 12 mg/dL (ref 6–20)
CALCIUM: 8.6 mg/dL — AB (ref 8.9–10.3)
CHLORIDE: 108 mmol/L (ref 101–111)
CO2: 24 mmol/L (ref 22–32)
CREATININE: 0.65 mg/dL (ref 0.44–1.00)
GFR calc Af Amer: >60 mL/min (ref >60)
Glucose, Bld: 101 mg/dL — ABNORMAL HIGH (ref 65–99)
Potassium: 3.9 mmol/L (ref 3.5–5.1)
Sodium: 142 mmol/L (ref 135–145)
Total Protein: 6.5 g/dL (ref 6.5–8.1)

## 2015-04-14 LAB — CBC WITH DIFFERENTIAL/PLATELET
Basophils Absolute: 0.1 10*3/uL (ref 0.0–0.1)
Basophils Relative: 1 %
EOS ABS: 0.2 10*3/uL (ref 0.0–0.7)
EOS PCT: 2 %
HCT: 41.6 % (ref 36.0–46.0)
Hemoglobin: 14.3 g/dL (ref 12.0–15.0)
LYMPHS ABS: 2.2 10*3/uL (ref 0.7–4.0)
Lymphocytes Relative: 21 %
MCH: 32.1 pg (ref 26.0–34.0)
MCHC: 34.4 g/dL (ref 30.0–36.0)
MCV: 93.3 fL (ref 78.0–100.0)
MONO ABS: 0.6 10*3/uL (ref 0.1–1.0)
MONOS PCT: 5 %
Neutro Abs: 7.5 10*3/uL (ref 1.7–7.7)
Neutrophils Relative %: 71 %
PLATELETS: 246 10*3/uL (ref 150–400)
RBC: 4.46 MIL/uL (ref 3.87–5.11)
RDW: 12.8 % (ref 11.5–15.5)
WBC: 10.4 10*3/uL (ref 4.0–10.5)

## 2015-04-14 LAB — TROPONIN I

## 2015-04-14 LAB — BRAIN NATRIURETIC PEPTIDE: B Natriuretic Peptide: 202 pg/mL — ABNORMAL HIGH (ref 0.0–100.0)

## 2015-04-14 MED ORDER — IOHEXOL 350 MG/ML SOLN
100.0000 mL | Freq: Once | INTRAVENOUS | Status: AC | PRN
  Administered 2015-04-14: 100 mL via INTRAVENOUS

## 2015-04-14 NOTE — ED Notes (Addendum)
Pt c/o sob when she wakes up x 4 days and bilateral feet swelling x 3 days.Pt states she was diagnosed in 2012 with stage 4 lung cancer with mets to the brain. Pt quit taking treatments last year. Pt also c/o pain in her mid back x 3 weeks as well.

## 2015-04-14 NOTE — ED Notes (Signed)
MD at bedside. 

## 2015-04-14 NOTE — ED Provider Notes (Signed)
CSN: [id number]     Arrival date & time 04/14/15  2048 History  By signing my name below, I, Emmanuella Mensah, attest that this documentation has been prepared under the direction and in the presence of Ezequiel Essex, MD. Electronically Signed: Judithann Sauger, ED Scribe. 04/14/2015. 9:21 PM.    Chief Complaint  Patient presents with  . Shortness of Breath   The history is provided by the patient. No language interpreter was used.   HPI Comments: Courtney West is a 64 y.o. female with a hx of COPD, lung cancer, and brain cancer who presents to the Emergency Department complaining of gradually worsening intermittent moderate SOB onset 4-5 days. She reports associated intermittent non-productive cough since consent, gradually worsening constant mid back pain that radiates around to the right lateral side onset 3 weeks ago, and bilateral leg swelling. She denies any fever, productive cough, CP, abdominal pain, n/v/d, numbness, tingling, weakness, loss of bladder/bowel control, or rash.  She reports that she has been taking Gabapentin and Robaxin, and Meloxicam with no relief. She states that she was placed on Xarelto recently after her symptoms began and she has missed 2 doses but took her medication today. She states that she has not been using her oxygen for a month and a half although she is supposed to be on oxygen. She denies any hx of MI or heart issues. She denies that she is currently on Chemo or radiation for her cancer, her last session was about a year and a half ago. She adds that her Cancer physician wants her to start Chemo but she does not want to. She denies any surgery for her AAA.   No PCP   Past Medical History  Diagnosis Date  . Hypertension   . COPD (chronic obstructive pulmonary disease) ([place])   . Stroke ([place])   . Abdominal aortic aneurysm ([place])   . Dyslipidemia   . S/P radiation therapy 7/12 thru 9/12, 04/18/11    xrt to brain mets  . Lung cancer ([place]) 12/2010  . Brain  cancer ([place])     mets from lung primary  . Allergy     codeine,pcn,sulfa drugs  . Radiation 10/20/2011    frontal mets/Palliation  . History of radiation therapy 01/19/12, 01/21/12, 01/26/12    RLL lung  . History of radiation therapy 04/18/11    single fraction to 6 brain mets  . Clotted vascular catheter ([place]) 03/30/2012  . Lung cancer ([place]) 12/22/2010   Past Surgical History  Procedure Laterality Date  . Carpal tunnel release    . Left knee surgery    . Cholecystectomy    . Partial hysterectomy     Family History  Problem Relation Age of Onset  . Cancer Father     lung, leukemia  . Cancer Maternal Aunt     kidney   Social History  Substance Use Topics  . Smoking status: Current Every Day Smoker -- 1.00 packs/day for 50 years    Types: Cigarettes  . Smokeless tobacco: None  . Alcohol Use: No   OB History    No data available     Review of Systems  Constitutional: Negative for fever and chills.  Respiratory: Positive for cough and shortness of breath.   Cardiovascular: Positive for leg swelling. Negative for chest pain.  Gastrointestinal: Negative for nausea, vomiting, abdominal pain and diarrhea.  Genitourinary: Negative for dysuria.  Musculoskeletal: Positive for back pain.  Skin: Negative for rash.  Neurological: Negative for weakness  and numbness.    A complete 10 system review of systems was obtained and all systems are negative except as noted in the HPI and PMH.     Allergies  Sulfa antibiotics; Carboplatin; Codeine; Dilaudid; and Penicillins  Home Medications   Prior to Admission medications   Medication Sig Start Date End Date Taking? Authorizing Provider  albuterol (PROVENTIL HFA;VENTOLIN HFA) 108 (90 BASE) MCG/ACT inhaler Inhale 2 puffs into the lungs every 4 (four) hours as needed for wheezing or shortness of breath. 12/20/14  Yes Tyler Pita, MD  eszopiclone (LUNESTA) 2 MG TABS tablet Take 1 tablet (2 mg total) by mouth at bedtime as needed for  sleep. Take immediately before bedtime 12/20/14  Yes Tyler Pita, MD  gabapentin (NEURONTIN) 300 MG capsule Take 2 capsules (600 mg total) by mouth 2 (two) times daily. 03/28/15  Yes Oswald Hillock, MD  ibuprofen (ADVIL,MOTRIN) 200 MG tablet Take 800 mg by mouth every 6 (six) hours as needed.   Yes Historical Provider, MD  ipratropium-albuterol (DUONEB) 0.5-2.5 (3) MG/3ML SOLN Take 3 mLs by nebulization every 6 (six) hours as needed. 03/28/15  Yes Oswald Hillock, MD  methocarbamol (ROBAXIN) 500 MG tablet Take 1 tablet (500 mg total) by mouth 2 (two) times daily. 04/04/15  Yes Charlann Lange, PA-C  Rivaroxaban (XARELTO STARTER PACK) 15 & 20 MG TBPK Take as directed on package: Start with one '15mg'$  tablet by mouth twice a day with food. On Day 22, switch to one '20mg'$  tablet once a day with food. 03/28/15  Yes Oswald Hillock, MD  SYMBICORT 80-4.5 MCG/ACT inhaler INHALE TWO PUFFS INTO LUNGS AS NEEDED Patient taking differently: INHALE TWO PUFFS INTO LUNGS AS NEEDED for shortness of breath 06/30/14  Yes Tyler Pita, MD  diclofenac sodium (VOLTAREN) 1 % GEL Apply 2 g topically 4 (four) times daily. Patient not taking: Reported on 12/20/2014 11/23/14   Tyler Pita, MD  HYDROcodone-acetaminophen (NORCO/VICODIN) 5-325 MG tablet Take 1 tablet by mouth every 4 (four) hours as needed. 04/15/15   Kristen N Ward, DO  levofloxacin (LEVAQUIN) 500 MG tablet Take 1 tablet (500 mg total) by mouth daily. 04/15/15   Kristen N Ward, DO  predniSONE (DELTASONE) 20 MG tablet Take 3 tablets (60 mg total) by mouth daily. 04/15/15   Kristen N Ward, DO  rivaroxaban (XARELTO) 20 MG TABS tablet Take 1 tablet (20 mg total) by mouth daily with supper. 04/17/15   Oswald Hillock, MD   BP 115/65 mmHg  Pulse 92  Temp(Src) 98.1 F (36.7 C) (Oral)  Resp 24  Ht '5\' 3"'$  (1.6 m)  Wt 177 lb (80.287 kg)  BMI 31.36 kg/m2  SpO2 90% Physical Exam  Constitutional: She is oriented to person, place, and time. She appears well-developed and  well-nourished. No distress.  HENT:  Head: Normocephalic and atraumatic.  Mouth/Throat: Oropharynx is clear and moist. No oropharyngeal exudate.  Eyes: Conjunctivae and EOM are normal. Pupils are equal, round, and reactive to light.  Neck: Normal range of motion. Neck supple.  No meningismus.  Cardiovascular: Normal rate, regular rhythm, normal heart sounds and intact distal pulses.   No murmur heard. Pulmonary/Chest: Effort normal and breath sounds normal. No respiratory distress.  Lungs clear without wheezing  Abdominal: Soft. There is no tenderness. There is no rebound and no guarding.  Musculoskeletal: Normal range of motion. She exhibits no edema or tenderness.  +1 edema of ankles bilaterally Tenderness to right posterior and lateral ribs  Neurological: She is alert and  oriented to person, place, and time. No cranial nerve deficit. She exhibits normal muscle tone. Coordination normal.  No ataxia on finger to nose bilaterally. No pronator drift. 5/5 strength throughout. CN 2-12 intact. Negative Romberg. Equal grip strength. Sensation intact. Gait is normal.   Skin: Skin is warm. No rash noted.  Psychiatric: She has a normal mood and affect. Her behavior is normal.  Nursing note and vitals reviewed.   ED Course  Procedures (including critical care time) DIAGNOSTIC STUDIES: Oxygen Saturation is 100% on RA, normal by my interpretation.    COORDINATION OF CARE: 9:16 PM- Pt advised of plan for treatment and pt agrees. Pt will receive an EKG, X-ray and blood work. Pt denies any pain medication at this time.    Labs Review Labs Reviewed  COMPREHENSIVE METABOLIC PANEL - Abnormal; Notable for the following:    Glucose, Bld 101 (*)    Calcium 8.6 (*)    Albumin 3.1 (*)    All other components within normal limits  BRAIN NATRIURETIC PEPTIDE - Abnormal; Notable for the following:    B Natriuretic Peptide 202.0 (*)    All other components within normal limits  PROTIME-INR - Abnormal;  Notable for the following:    Prothrombin Time 19.1 (*)    INR 1.60 (*)    All other components within normal limits  CBC WITH DIFFERENTIAL/PLATELET  TROPONIN I    Imaging Review Dg Chest 2 View  04/14/2015  CLINICAL DATA:  Shortness of breath for 4 days. Foot swelling for 3 days. History of lung cancer EXAM: CHEST  2 VIEW COMPARISON:  Chest CT 03/26/2015 FINDINGS: The tip of the right chest port in the SVC. Cardiomediastinal contours are enlarged, pericardial effusion seen on recent CT. No pulmonary edema. Ill-defined left hilar airspace disease, similar to prior CT. Ill-defined right basilar opacity, also unchanged. No new consolidation. No pleural effusion. No pneumothorax. IMPRESSION: 1. No acute process. 2. Enlarged cardiomediastinal contours, pericardial effusion noted on recent CT. 3. Bilateral perihilar opacities, similar to prior exam. Electronically Signed   By: Jeb Levering M.D.   On: 04/14/2015 21:51   Ct Angio Chest Pe W/cm &/or Wo Cm  04/15/2015  CLINICAL DATA:  Acute onset of intermittent worsening moderate shortness of breath and nonproductive cough. Mid back pain, radiating to the right side. Bilateral leg swelling. Initial encounter. EXAM: CT ANGIOGRAPHY CHEST WITH CONTRAST TECHNIQUE: Multidetector CT imaging of the chest was performed using the standard protocol during bolus administration of intravenous contrast. Multiplanar CT image reconstructions and MIPs were obtained to evaluate the vascular anatomy. CONTRAST:  121m OMNIPAQUE IOHEXOL 350 MG/ML SOLN COMPARISON:  CTA of the chest performed 03/26/2015, and chest radiograph performed earlier today at 9:16 p.m. FINDINGS: There is no evidence of pulmonary embolus. Focal airspace opacity at the right lung base is perhaps slightly improved from the prior study, though trace right-sided pleural fluid is now seen. This may reflect an acute infectious process, given the patient's symptoms. A 7 mm nodule is noted posteriorly within  the right upper lobe (image 29 of 84), slightly better characterized than on the prior study due to prior atelectasis. Airspace opacification about the left hilum is relatively stable and likely reflects post radiation change. Underlying emphysematous change is again noted at the upper lung lobes. There is no evidence of pneumothorax. A small to moderate pericardial effusion is noted, relatively stable from the prior study. Previously noted azygoesophageal recess and right hilar nodes are slightly less prominent, without evidence of lymphadenopathy.  Scattered calcification is noted along the aortic arch. The great vessels are grossly unremarkable in appearance. A right-sided chest port is noted. No axillary lymphadenopathy is seen. The visualized portions of the thyroid gland are unremarkable in appearance. The visualized portions of the liver are unremarkable. Calcification is again noted along the periphery of the spleen. No acute osseous abnormalities are seen. Review of the MIP images confirms the above findings. IMPRESSION: 1. No evidence of pulmonary embolus. 2. Slight interval improvement in focal airspace opacity at the right lung base, though trace right-sided pleural fluid is now seen. This may reflect an acute infectious process, given the patient's symptoms. 3. Left perihilar postradiation change is unchanged from the prior study. 7 mm nodule noted posteriorly within the right upper lung lobe. Underlying bilateral emphysematous change noted at the upper lung lobes. 4. Small to moderate pericardial effusion is relatively stable in appearance. Electronically Signed   By: Garald Balding M.D.   On: 04/15/2015 00:32   Ezequiel Essex, MD has personally reviewed and evaluated these images and lab results as part of his medical decision-making.   EKG Interpretation   Date/Time:  Saturday April 14 2015 21:45:29 EDT Ventricular Rate:  87 PR Interval:  170 QRS Duration: 96 QT Interval:  375 QTC  Calculation: 451 R Axis:   13 Text Interpretation:  Sinus rhythm Low voltage, extremity and precordial  leads Nonspecific T abnormalities, lateral leads Inferior Q waves noted No  significant change was found Confirmed by Wyvonnia Dusky  MD, Hymie Gorr [phone]) on  04/14/2015 9:54:11 PM      MDM   Final diagnoses:  Dyspnea  Right-sided thoracic back pain  CAP (community acquired pneumonia)  Chronic obstructive pulmonary disease with acute exacerbation ([place])   Patient with history of metastatic lung cancer presenting with four-day history of shortness of breath with ankle swelling. Denies any chest pain. Also states she's had chronic pain to her right mid back reading around to the right side for the past 3 weeks. Denies cough or fever.  Patient in no distress. She is not hypoxic despite not having on her home oxygen. EKG is unchanged. Chest x-ray is unchanged. No wheezing or hypoxia.  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 CXR shows stable enlarged mediastinum and clear lungs. Known to have pericardial effusion. No evidence of volume overload.  CTPE will be obtained to further evaluate the patient's dyspnea and back pain.  Care transferred to Dr. Leonides Schanz at sign out.   I personally performed the services described in this documentation, which was scribed in my presence. The recorded information has been reviewed and is accurate.    Ezequiel Essex, MD 04/15/15 1325

## 2015-04-15 DIAGNOSIS — J441 Chronic obstructive pulmonary disease with (acute) exacerbation: Secondary | ICD-10-CM | POA: Diagnosis not present

## 2015-04-15 MED ORDER — PREDNISONE 20 MG PO TABS: 60.0000 mg | ORAL_TABLET | Freq: Every day | ORAL | Status: DC

## 2015-04-15 MED ORDER — PREDNISONE 10 MG PO TABS
60.0000 mg | ORAL_TABLET | Freq: Once | ORAL | Status: AC
  Administered 2015-04-15: 60 mg via ORAL
  Filled 2015-04-15 (×2): qty 1

## 2015-04-15 MED ORDER — ALBUTEROL SULFATE HFA 108 (90 BASE) MCG/ACT IN AERS
4.0000 | INHALATION_SPRAY | Freq: Once | RESPIRATORY_TRACT | Status: AC
  Administered 2015-04-15: 4 via RESPIRATORY_TRACT
  Filled 2015-04-15: qty 6.7

## 2015-04-15 MED ORDER — HYDROCODONE-ACETAMINOPHEN 5-325 MG PO TABS: 1.0000 | ORAL_TABLET | ORAL | Status: DC | PRN

## 2015-04-15 MED ORDER — LEVOFLOXACIN 500 MG PO TABS: 500.0000 mg | ORAL_TABLET | Freq: Every day | ORAL | Status: DC

## 2015-04-15 MED ORDER — LEVOFLOXACIN IN D5W 500 MG/100ML IV SOLN
500.0000 mg | Freq: Once | INTRAVENOUS | Status: AC
  Administered 2015-04-15: 500 mg via INTRAVENOUS
  Filled 2015-04-15: qty 100

## 2015-04-15 MED ORDER — ALBUTEROL SULFATE HFA 108 (90 BASE) MCG/ACT IN AERS: 2.0000 | INHALATION_SPRAY | RESPIRATORY_TRACT | Status: DC | PRN

## 2015-04-15 NOTE — ED Provider Notes (Addendum)
1:00 AM  Assumed care from Dr. Wyvonnia Dusky.  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 She tells me that she is here because she is continue to have pain and is out of her Robaxin. 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 Patient's labs today are unremarkable. No leukocytosis. No signs of volume overload. Troponin negative. She states she was previously on oxygen but does not have her oxygen currently. Oxygen saturation has been in the upper 90s in the emergency department. She does have some mild expiratory wheezing on exam when I reassessed her but has good air movement and is no respiratory distress, speaking full sentences. Given albuterol inhaler with relief.  CT of her chest shows no pulmonary embolus. She has a focal airspace opacity that was at the right lung base with a trace right-sided pleural effusion. This opacity was seen on previously on CT scan 10/2 and is improving. It appears she completed a course of Levaquin at the beginning of October. She has a small to moderate pericardial effusion that has been present for several months and is stable. She is hemodynamically stable in the ED.  Given this focal airspace opacity in the right lung base, will treat with antibiotics. It is persistent but has improved since the beginning of October. Have offered her admission the patient would like to go home. I feel this is reasonable given she is not febrile, toxic, hypotensive, hypoxic or in any respiratory distress. Given she is having some wheezing currently and has a history of COPD we'll discharge with albuterol, prednisone. We'll give her outpatient follow-up information. We'll also provide her with a prescription for Vicodin for her right rib pain. Discussed return precautions. She verbalized understanding and is  comfortable with this plan.  [place], DO 04/15/15 [place], DO 04/15/15 [id number]

## 2015-04-15 NOTE — ED Notes (Signed)
Pt came back from ct sob. Pt's o2 was 86% on room air which finally raised to 91%. Pt was ambulated in the hallway at 2 different occasions 1 for the initial order for ambulation and her o2 sats were around 95%. Then the pt needed to go to the restroom when I got her back in bed and when ambulating, pt's o2 was anywhere between 87%-93%.

## 2015-04-15 NOTE — Discharge Instructions (Signed)
Community-Acquired Pneumonia, Adult Pneumonia is an infection of the lungs. There are different types of pneumonia. One type can develop while a person is in a hospital. A different type, called community-acquired pneumonia, develops in people who are not, or have not recently been, in the hospital or other health care facility.  CAUSES Pneumonia may be caused by bacteria, viruses, or funguses. Community-acquired pneumonia is often caused by Streptococcus pneumonia bacteria. These bacteria are often passed from one person to another by breathing in droplets from the cough or sneeze of an infected person. RISK FACTORS The condition is more likely to develop in:  People who havechronic diseases, such as chronic obstructive pulmonary disease (COPD), asthma, congestive heart failure, cystic fibrosis, diabetes, or kidney disease.  People who haveearly-stage or late-stage HIV.  People who havesickle cell disease.  People who havehad their spleen removed (splenectomy).  People who havepoor Human resources officer.  People who havemedical conditions that increase the risk of breathing in (aspirating) secretions their own mouth and nose.   People who havea weakened immune system (immunocompromised).  People who smoke.  People whotravel to areas where pneumonia-causing germs commonly exist.  People whoare around animal habitats or animals that have pneumonia-causing germs, including birds, bats, rabbits, cats, and farm animals. SYMPTOMS Symptoms of this condition include:  Adry cough.  A wet (productive) cough.  Fever.  Sweating.  Chest pain, especially when breathing deeply or coughing.  Rapid breathing or difficulty breathing.  Shortness of breath.  Shaking chills.  Fatigue.  Muscle aches. DIAGNOSIS Your health care provider will take a medical history and perform a physical exam. You may also have other tests, including:  Imaging studies of your chest, including  X-rays.  Tests to check your blood oxygen level and other blood gases.  Other tests on blood, mucus (sputum), fluid around your lungs (pleural fluid), and urine. If your pneumonia is severe, other tests may be done to identify the specific cause of your illness. TREATMENT The type of treatment that you receive depends on many factors, such as the cause of your pneumonia, the medicines you take, and other medical conditions that you have. For most adults, treatment and recovery from pneumonia may occur at home. In some cases, treatment must happen in a hospital. Treatment may include:  Antibiotic medicines, if the pneumonia was caused by bacteria.  Antiviral medicines, if the pneumonia was caused by a virus.  Medicines that are given by mouth or through an IV tube.  Oxygen.  Respiratory therapy. Although rare, treating severe pneumonia may include:  Mechanical ventilation. This is done if you are not breathing well on your own and you cannot maintain a safe blood oxygen level.  Thoracentesis. This procedureremoves fluid around one lung or both lungs to help you breathe better. HOME CARE INSTRUCTIONS 1. Take over-the-counter and prescription medicines only as told by your health care provider. 1. Only takecough medicine if you are losing sleep. Understand that cough medicine can prevent your body's natural ability to remove mucus from your lungs. 2. If you were prescribed an antibiotic medicine, take it as told by your health care provider. Do not stop taking the antibiotic even if you start to feel better. 2. Sleep in a semi-upright position at night. Try sleeping in a reclining chair, or place a few pillows under your head. 3. Do not use tobacco products, including cigarettes, chewing tobacco, and e-cigarettes. If you need help quitting, ask your health care provider. 4. Drink enough water to keep your urine  clear or pale yellow. This will help to thin out mucus secretions in your  lungs. PREVENTION There are ways that you can decrease your risk of developing community-acquired pneumonia. Consider getting a pneumococcal vaccine if:  You are older than 64 years of age.  You are older than 64 years of age and are undergoing cancer treatment, have chronic lung disease, or have other medical conditions that affect your immune system. Ask your health care provider if this applies to you. There are different types and schedules of pneumococcal vaccines. Ask your health care provider which vaccination option is best for you. You may also prevent community-acquired pneumonia if you take these actions:  Get an influenza vaccine every year. Ask your health care provider which type of influenza vaccine is best for you.  Go to the dentist on a regular basis.  Wash your hands often. Use hand sanitizer if soap and water are not available. SEEK MEDICAL CARE IF:  You have a fever.  You are losing sleep because you cannot control your cough with cough medicine. SEEK IMMEDIATE MEDICAL CARE IF:  You have worsening shortness of breath.  You have increased chest pain.  Your sickness becomes worse, especially if you are an older adult or have a weakened immune system.  You cough up blood.   This information is not intended to replace advice given to you by your health care provider. Make sure you discuss any questions you have with your health care provider.   Document Released: 06/09/2005 Document Revised: 02/28/2015 Document Reviewed: 10/04/2014 Elsevier Interactive Patient Education 2016 Elsevier Inc. Chronic Obstructive Pulmonary Disease Chronic obstructive pulmonary disease (COPD) is a common lung condition in which airflow from the lungs is limited. COPD is a general term that can be used to describe many different lung problems that limit airflow, including both chronic bronchitis and emphysema. If you have COPD, your lung function will probably never return to normal, but  there are measures you can take to improve lung function and make yourself feel better. CAUSES   Smoking (common).  Exposure to secondhand smoke.  Genetic problems.  Chronic inflammatory lung diseases or recurrent infections. SYMPTOMS  Shortness of breath, especially with physical activity.  Deep, persistent (chronic) cough with a large amount of thick mucus.  Wheezing.  Rapid breaths (tachypnea).  Gray or bluish discoloration (cyanosis) of the skin, especially in your fingers, toes, or lips.  Fatigue.  Weight loss.  Frequent infections or episodes when breathing symptoms become much worse (exacerbations).  Chest tightness. DIAGNOSIS Your health care provider will take a medical history and perform a physical examination to diagnose COPD. Additional tests for COPD may include:  Lung (pulmonary) function tests.  Chest X-ray.  CT scan.  Blood tests. TREATMENT  Treatment for COPD may include:  Inhaler and nebulizer medicines. These help manage the symptoms of COPD and make your breathing more comfortable.  Supplemental oxygen. Supplemental oxygen is only helpful if you have a low oxygen level in your blood.  Exercise and physical activity. These are beneficial for nearly all people with COPD.  Lung surgery or transplant.  Nutrition therapy to gain weight, if you are underweight.  Pulmonary rehabilitation. This may involve working with a team of health care providers and specialists, such as respiratory, occupational, and physical therapists. HOME CARE INSTRUCTIONS  Take all medicines (inhaled or pills) as directed by your health care provider.  Avoid over-the-counter medicines or cough syrups that dry up your airway (such as antihistamines) and slow  down the elimination of secretions unless instructed otherwise by your health care provider.  If you are a smoker, the most important thing that you can do is stop smoking. Continuing to smoke will cause further  lung damage and breathing trouble. Ask your health care provider for help with quitting smoking. He or she can direct you to community resources or hospitals that provide support.  Avoid exposure to irritants such as smoke, chemicals, and fumes that aggravate your breathing.  Use oxygen therapy and pulmonary rehabilitation if directed by your health care provider. If you require home oxygen therapy, ask your health care provider whether you should purchase a pulse oximeter to measure your oxygen level at home.  Avoid contact with individuals who have a contagious illness.  Avoid extreme temperature and humidity changes.  Eat healthy foods. Eating smaller, more frequent meals and resting before meals may help you maintain your strength.  Stay active, but balance activity with periods of rest. Exercise and physical activity will help you maintain your ability to do things you want to do.  Preventing infection and hospitalization is very important when you have COPD. Make sure to receive all the vaccines your health care provider recommends, especially the pneumococcal and influenza vaccines. Ask your health care provider whether you need a pneumonia vaccine.  Learn and use relaxation techniques to manage stress.  Learn and use controlled breathing techniques as directed by your health care provider. Controlled breathing techniques include:  Pursed lip breathing. Start by breathing in (inhaling) through your nose for 1 second. Then, purse your lips as if you were going to whistle and breathe out (exhale) through the pursed lips for 2 seconds.  Diaphragmatic breathing. Start by putting one hand on your abdomen just above your waist. Inhale slowly through your nose. The hand on your abdomen should move out. Then purse your lips and exhale slowly. You should be able to feel the hand on your abdomen moving in as you exhale.  Learn and use controlled coughing to clear mucus from your lungs.  Controlled coughing is a series of short, progressive coughs. The steps of controlled coughing are: 5. Lean your head slightly forward. 6. Breathe in deeply using diaphragmatic breathing. 7. Try to hold your breath for 3 seconds. 8. Keep your mouth slightly open while coughing twice. 9. Spit any mucus out into a tissue. 10. Rest and repeat the steps once or twice as needed. SEEK MEDICAL CARE IF:  You are coughing up more mucus than usual.  There is a change in the color or thickness of your mucus.  Your breathing is more labored than usual.  Your breathing is faster than usual. SEEK IMMEDIATE MEDICAL CARE IF:  You have shortness of breath while you are resting.  You have shortness of breath that prevents you from:  Being able to talk.  Performing your usual physical activities.  You have chest pain lasting longer than 5 minutes.  Your skin color is more cyanotic than usual.  You measure low oxygen saturations for longer than 5 minutes with a pulse oximeter. MAKE SURE YOU:  Understand these instructions.  Will watch your condition.  Will get help right away if you are not doing well or get worse.   This information is not intended to replace advice given to you by your health care provider. Make sure you discuss any questions you have with your health care provider.   Document Released: 03/19/2005 Document Revised: 06/30/2014 Document Reviewed: 02/03/2013 Elsevier Interactive Patient  Education ©2016 Elsevier Inc. ° °

## 2015-04-18 ENCOUNTER — Telehealth: Payer: Self-pay | Admitting: Internal Medicine

## 2015-04-18 NOTE — Telephone Encounter (Signed)
returned call and s.w. pt and sche f.u appt....ok and aware

## 2015-04-26 ENCOUNTER — Ambulatory Visit (HOSPITAL_BASED_OUTPATIENT_CLINIC_OR_DEPARTMENT_OTHER): Payer: Medicare Other | Admitting: Oncology

## 2015-04-26 ENCOUNTER — Encounter: Payer: Self-pay | Admitting: Oncology

## 2015-04-26 VITALS — BP 118/68 | HR 88 | Temp 98.7°F | Resp 18 | Ht 63.0 in | Wt 180.8 lb

## 2015-04-26 DIAGNOSIS — C3491 Malignant neoplasm of unspecified part of right bronchus or lung: Secondary | ICD-10-CM

## 2015-04-26 DIAGNOSIS — C7931 Secondary malignant neoplasm of brain: Secondary | ICD-10-CM | POA: Diagnosis not present

## 2015-04-26 DIAGNOSIS — M549 Dorsalgia, unspecified: Secondary | ICD-10-CM | POA: Diagnosis not present

## 2015-04-26 DIAGNOSIS — C3490 Malignant neoplasm of unspecified part of unspecified bronchus or lung: Secondary | ICD-10-CM

## 2015-04-26 MED ORDER — METHOCARBAMOL 500 MG PO TABS: 500.0000 mg | ORAL_TABLET | Freq: Two times a day (BID) | ORAL | Status: DC

## 2015-04-26 NOTE — Progress Notes (Signed)
[facility] [place] Telephone:([phone]   [phone]  OFFICE PROGRESS NOTE  No PCP Per Patient No address on file  DIAGNOSIS: Metastatic non-small cell lung cancer, favoring adenocarcinoma diagnosed in July of 2012, presented with locally advanced disease in the chest as well as brain metastasis. Veristrat test poor.   PRIOR THERAPY:  1. Status post stereotactic radiotherapy to 2 brain lesions under the care of Dr. Lisbeth Renshaw. 2. Status post a course of concurrent chemoradiation with weekly carboplatin and paclitaxel, last dose of chemotherapy was given 03/05/2011. 3. Systemic chemotherapy with carboplatin for AUC of 5 and Alimta 500 mg/M2. The patient is status post 3 cycles. Last dose was given 06/03/2011  CURRENT THERAPY: Observation.  INTERVAL HISTORY: Courtney West 64 y.o. female returns to the clinic today for a hospital follow-up. She was hospitalized in October for a COPD exacerbation. 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 Does not want to continue the Xarelto and is hoping we will stop it. Admits to missing doses. The patient is feeling fine today with no specific complaints except for mild fatigue, shortness breath with exertion, and ongoing pain to her back near her right scapula. Using Robaxin for this pain. Unfortunately she continues to smoke and unwilling to quit smoking. She denied having any significant chest pain, shortness of breath, cough or hemoptysis. She has no weight loss or night sweats. The patient has scheduled an appointment with our office today since she was instructed to see her PCP following hospital discharge, but she does not have a PCP.  MEDICAL HISTORY: Past Medical History  Diagnosis Date  . Hypertension   . COPD (chronic obstructive pulmonary disease) ([place])   . Stroke ([place])   . Abdominal aortic aneurysm ([place])   . Dyslipidemia   . S/P radiation therapy 7/12 thru 9/12, 04/18/11   xrt to brain mets  . Lung cancer ([place]) 12/2010  . Brain cancer ([place])     mets from lung primary  . Allergy     codeine,pcn,sulfa drugs  . Radiation 10/20/2011    frontal mets/Palliation  . History of radiation therapy 01/19/12, 01/21/12, 01/26/12    RLL lung  . History of radiation therapy 04/18/11    single fraction to 6 brain mets  . Clotted vascular catheter ([place]) 03/30/2012  . Lung cancer ([place]) 12/22/2010    ALLERGIES:  is allergic to sulfa antibiotics; carboplatin; codeine; dilaudid; and penicillins.  MEDICATIONS:  Current Outpatient Prescriptions  Medication Sig Dispense Refill  . albuterol (PROVENTIL HFA;VENTOLIN HFA) 108 (90 BASE) MCG/ACT inhaler Inhale 2 puffs into the lungs every 4 (four) hours as needed for wheezing or shortness of breath. 6.7 g 0  . gabapentin (NEURONTIN) 300 MG capsule Take 2 capsules (600 mg total) by mouth 2 (two) times daily. 30 capsule 2  . ibuprofen (ADVIL,MOTRIN) 200 MG tablet Take 800 mg by mouth every 6 (six) hours as needed.    Marland Kitchen ipratropium-albuterol (DUONEB) 0.5-2.5 (3) MG/3ML SOLN Take 3 mLs by nebulization every 6 (six) hours as needed. 360 mL 2  . levofloxacin (LEVAQUIN) 500 MG tablet Take 1 tablet (500 mg total) by mouth daily. 10 tablet 0  . methocarbamol (ROBAXIN) 500 MG tablet Take 1 tablet (500 mg total) by mouth 2 (two) times daily. 30 tablet 0  . rivaroxaban (XARELTO) 20 MG TABS tablet Take 1 tablet (20 mg total) by mouth daily with supper. 30 tablet 2  . SYMBICORT  80-4.5 MCG/ACT inhaler INHALE TWO PUFFS INTO LUNGS AS NEEDED (Patient taking differently: INHALE TWO PUFFS INTO LUNGS AS NEEDED for shortness of breath) 2 Inhaler 5  . diclofenac sodium (VOLTAREN) 1 % GEL Apply 2 g topically 4 (four) times daily. (Patient not taking: Reported on 12/20/2014) 100 g 5  . HYDROcodone-acetaminophen (NORCO/VICODIN) 5-325 MG tablet Take 1 tablet by mouth every 4 (four) hours as needed. (Patient not taking: Reported on 04/26/2015) 15 tablet 0   No current  facility-administered medications for this visit.    SURGICAL HISTORY:  Past Surgical History  Procedure Laterality Date  . Carpal tunnel release    . Left knee surgery    . Cholecystectomy    . Partial hysterectomy      REVIEW OF SYSTEMS:  A comprehensive review of systems was negative except for: Constitutional: positive for fatigue Respiratory: positive for dyspnea on exertion   PHYSICAL EXAMINATION: General appearance: alert, cooperative, fatigued and no distress Head: Normocephalic, without obvious abnormality, atraumatic Neck: no adenopathy, no JVD, supple, symmetrical, trachea midline and thyroid not enlarged, symmetric, no tenderness/mass/nodules Lymph nodes: Cervical, supraclavicular, and axillary nodes normal. Resp: wheezes bilaterally Back: symmetric, no curvature. ROM normal. No CVA tenderness. Cardio: regular rate and rhythm, S1, S2 normal, no murmur, click, rub or gallop GI: soft, non-tender; bowel sounds normal; no masses,  no organomegaly Extremities: extremities normal, atraumatic, no cyanosis or edema  ECOG PERFORMANCE STATUS: 1 - Symptomatic but completely ambulatory  Blood pressure 118/68, pulse 88, temperature 98.7 F (37.1 C), temperature source Oral, resp. rate 18, height '5\' 3"'$  (1.6 m), weight 180 lb 12.8 oz (82.01 kg), SpO2 92 %.  LABORATORY DATA: Lab Results  Component Value Date   WBC 10.4 04/14/2015   HGB 14.3 04/14/2015   HCT 41.6 04/14/2015   MCV 93.3 04/14/2015   PLT 246 04/14/2015      Chemistry      Component Value Date/Time   NA 142 04/14/2015 2149   NA 143 12/11/2014 0839   NA 143 12/24/2011 1311   K 3.9 04/14/2015 2149   K 5.0 12/11/2014 0839   K 4.7 12/24/2011 1311   CL 108 04/14/2015 2149   CL 106 10/06/2012 1303   CL 99 12/24/2011 1311   CO2 24 04/14/2015 2149   CO2 28 12/11/2014 0839   CO2 31 12/24/2011 1311   BUN 12 04/14/2015 2149   BUN 10.9 12/11/2014 0840   BUN 13 12/24/2011 1311   CREATININE 0.65 04/14/2015 2149    CREATININE 0.9 12/11/2014 0840   CREATININE 1.2 12/24/2011 1311      Component Value Date/Time   CALCIUM 8.6* 04/14/2015 2149   CALCIUM 9.7 12/11/2014 0839   CALCIUM 10.2 12/24/2011 1311   ALKPHOS 74 04/14/2015 2149   ALKPHOS 93 12/11/2014 0839   ALKPHOS 98* 12/24/2011 1311   AST 16 04/14/2015 2149   AST 19 12/11/2014 0839   AST 18 12/24/2011 1311   ALT 16 04/14/2015 2149   ALT 21 12/11/2014 0839   ALT 18 12/24/2011 1311   BILITOT 0.5 04/14/2015 2149   BILITOT 0.42 12/11/2014 0839   BILITOT 0.50 12/24/2011 1311       RADIOGRAPHIC STUDIES: Dg Chest 2 View  04/14/2015  CLINICAL DATA:  Shortness of breath for 4 days. Foot swelling for 3 days. History of lung cancer EXAM: CHEST  2 VIEW COMPARISON:  Chest CT 03/26/2015 FINDINGS: The tip of the right chest port in the SVC. Cardiomediastinal contours are enlarged, pericardial effusion seen on recent  CT. No pulmonary edema. Ill-defined left hilar airspace disease, similar to prior CT. Ill-defined right basilar opacity, also unchanged. No new consolidation. No pleural effusion. No pneumothorax. IMPRESSION: 1. No acute process. 2. Enlarged cardiomediastinal contours, pericardial effusion noted on recent CT. 3. Bilateral perihilar opacities, similar to prior exam. Electronically Signed   By: Jeb Levering M.D.   On: 04/14/2015 21:51   Ct Angio Chest Pe W/cm &/or Wo Cm  04/15/2015  CLINICAL DATA:  Acute onset of intermittent worsening moderate shortness of breath and nonproductive cough. Mid back pain, radiating to the right side. Bilateral leg swelling. Initial encounter. EXAM: CT ANGIOGRAPHY CHEST WITH CONTRAST TECHNIQUE: Multidetector CT imaging of the chest was performed using the standard protocol during bolus administration of intravenous contrast. Multiplanar CT image reconstructions and MIPs were obtained to evaluate the vascular anatomy. CONTRAST:  124m OMNIPAQUE IOHEXOL 350 MG/ML SOLN COMPARISON:  CTA of the chest performed  03/26/2015, and chest radiograph performed earlier today at 9:16 p.m. FINDINGS: There is no evidence of pulmonary embolus. Focal airspace opacity at the right lung base is perhaps slightly improved from the prior study, though trace right-sided pleural fluid is now seen. This may reflect an acute infectious process, given the patient's symptoms. A 7 mm nodule is noted posteriorly within the right upper lobe (image 29 of 84), slightly better characterized than on the prior study due to prior atelectasis. Airspace opacification about the left hilum is relatively stable and likely reflects post radiation change. Underlying emphysematous change is again noted at the upper lung lobes. There is no evidence of pneumothorax. A small to moderate pericardial effusion is noted, relatively stable from the prior study. Previously noted azygoesophageal recess and right hilar nodes are slightly less prominent, without evidence of lymphadenopathy. Scattered calcification is noted along the aortic arch. The great vessels are grossly unremarkable in appearance. A right-sided chest port is noted. No axillary lymphadenopathy is seen. The visualized portions of the thyroid gland are unremarkable in appearance. The visualized portions of the liver are unremarkable. Calcification is again noted along the periphery of the spleen. No acute osseous abnormalities are seen. Review of the MIP images confirms the above findings. IMPRESSION: 1. No evidence of pulmonary embolus. 2. Slight interval improvement in focal airspace opacity at the right lung base, though trace right-sided pleural fluid is now seen. This may reflect an acute infectious process, given the patient's symptoms. 3. Left perihilar postradiation change is unchanged from the prior study. 7 mm nodule noted posteriorly within the right upper lung lobe. Underlying bilateral emphysematous change noted at the upper lung lobes. 4. Small to moderate pericardial effusion is relatively  stable in appearance. Electronically Signed   By: JGarald BaldingM.D.   On: 04/15/2015 00:32   ASSESSMENT AND PLAN: This is a very pleasant 64year old white female with metastatic non-small cell lung cancer, adenocarcinoma status post stereotactic radiotherapy to brain lesion as well as concurrent chemoradiation and consolidation systemic chemotherapy with carboplatin and Alimta status post 3 cycles.  The CT scan of the chest, abdomen and pelvis in June 2016 showed questionable disease progression with interval progression of the soft tissue associated with the postradiation scarring in the central right lower lobe with enlarging inferior right hilar lymph nodes. It was recommended that she begin systemic chemotherapy, but the patient declined. Recent CT angiogram reviewed and her lung cancer appears stable.   Recommend that she continue the [place] try not to miss doses.   No clearcut cause of  her right back pain noted on recent CT scan and CXR. Likely musculoskeletal in nature. Continue Robaxin. She is almost out of this medication and I have given her 30 tablets today.   Encouraged her to establish with a PCP for routine follow-up.  She will keep her appointment with Dr.Mohamed in December with scan just prior to this appointment.  I counseled the patient today about the smoke cessation but unfortunately she is not willing to quit smoking.  Patient discussed with Dr. Julien Nordmann today.  She was advised to call immediately if she has any concerning symptoms in the interval. The patient voices understanding of current disease status and treatment options and is in agreement with the current care plan. All questions were answered. The patient knows to call the clinic with any problems, questions or concerns. We can certainly see the patient much sooner if necessary.   Mikey Bussing, DNP, AGPCNP-BC, AOCNP

## 2015-06-15 ENCOUNTER — Other Ambulatory Visit: Payer: Self-pay | Admitting: Medical Oncology

## 2015-06-15 ENCOUNTER — Other Ambulatory Visit (HOSPITAL_BASED_OUTPATIENT_CLINIC_OR_DEPARTMENT_OTHER): Payer: Medicare Other

## 2015-06-15 ENCOUNTER — Other Ambulatory Visit (HOSPITAL_COMMUNITY)
Admission: AD | Admit: 2015-06-15 | Discharge: 2015-06-15 | Disposition: A | Discharge status: Home / Self Care | Payer: Medicare Other | Source: Ambulatory Visit | Attending: Internal Medicine | Admitting: Internal Medicine

## 2015-06-15 ENCOUNTER — Ambulatory Visit (HOSPITAL_COMMUNITY)
Admission: RE | Admit: 2015-06-15 | Discharge: 2015-06-15 | Disposition: A | Discharge status: Home / Self Care | Payer: Medicare Other | Source: Ambulatory Visit | Attending: Internal Medicine | Admitting: Internal Medicine

## 2015-06-15 DIAGNOSIS — R918 Other nonspecific abnormal finding of lung field: Secondary | ICD-10-CM | POA: Insufficient documentation

## 2015-06-15 DIAGNOSIS — C7949 Secondary malignant neoplasm of other parts of nervous system: Secondary | ICD-10-CM

## 2015-06-15 DIAGNOSIS — X58XXXA Exposure to other specified factors, initial encounter: Secondary | ICD-10-CM | POA: Diagnosis not present

## 2015-06-15 DIAGNOSIS — Z85118 Personal history of other malignant neoplasm of bronchus and lung: Secondary | ICD-10-CM | POA: Insufficient documentation

## 2015-06-15 DIAGNOSIS — S2241XA Multiple fractures of ribs, right side, initial encounter for closed fracture: Secondary | ICD-10-CM | POA: Insufficient documentation

## 2015-06-15 DIAGNOSIS — C3490 Malignant neoplasm of unspecified part of unspecified bronchus or lung: Secondary | ICD-10-CM

## 2015-06-15 DIAGNOSIS — J90 Pleural effusion, not elsewhere classified: Secondary | ICD-10-CM | POA: Diagnosis not present

## 2015-06-15 DIAGNOSIS — C3491 Malignant neoplasm of unspecified part of right bronchus or lung: Secondary | ICD-10-CM

## 2015-06-15 DIAGNOSIS — M4856XA Collapsed vertebra, not elsewhere classified, lumbar region, initial encounter for fracture: Secondary | ICD-10-CM | POA: Diagnosis not present

## 2015-06-15 DIAGNOSIS — I2510 Atherosclerotic heart disease of native coronary artery without angina pectoris: Secondary | ICD-10-CM | POA: Insufficient documentation

## 2015-06-15 DIAGNOSIS — C7931 Secondary malignant neoplasm of brain: Secondary | ICD-10-CM | POA: Insufficient documentation

## 2015-06-15 LAB — COMPREHENSIVE METABOLIC PANEL
ALK PHOS: 88 U/L (ref 38–126)
ALT: 13 U/L — ABNORMAL LOW (ref 14–54)
ANION GAP: 10 (ref 5–15)
AST: 13 U/L — ABNORMAL LOW (ref 15–41)
Albumin: 4 g/dL (ref 3.5–5.0)
BUN: 24 mg/dL — ABNORMAL HIGH (ref 6–20)
CALCIUM: 9.5 mg/dL (ref 8.9–10.3)
CO2: 29 mmol/L (ref 22–32)
Chloride: 102 mmol/L (ref 101–111)
Creatinine, Ser: 0.96 mg/dL (ref 0.44–1.00)
GFR calc non Af Amer: >60 mL/min (ref >60)
Glucose, Bld: 111 mg/dL — ABNORMAL HIGH (ref 65–99)
POTASSIUM: 4 mmol/L (ref 3.5–5.1)
SODIUM: 141 mmol/L (ref 135–145)
TOTAL PROTEIN: 7.1 g/dL (ref 6.5–8.1)
Total Bilirubin: 0.3 mg/dL (ref 0.3–1.2)

## 2015-06-15 LAB — CBC WITH DIFFERENTIAL/PLATELET
BASO%: 0.9 % (ref 0.0–2.0)
BASOS ABS: 0.1 10*3/uL (ref 0.0–0.1)
EOS ABS: 0.2 10*3/uL (ref 0.0–0.5)
EOS%: 1.7 % (ref 0.0–7.0)
HCT: 49.2 % — ABNORMAL HIGH (ref 34.8–46.6)
HEMOGLOBIN: 16.2 g/dL — AB (ref 11.6–15.9)
LYMPH%: 28.7 % (ref 14.0–49.7)
MCH: 31 pg (ref 25.1–34.0)
MCHC: 33 g/dL (ref 31.5–36.0)
MCV: 94 fL (ref 79.5–101.0)
MONO#: 0.8 10*3/uL (ref 0.1–0.9)
MONO%: 7.4 % (ref 0.0–14.0)
NEUT#: 6.6 10*3/uL — ABNORMAL HIGH (ref 1.5–6.5)
NEUT%: 61.3 % (ref 38.4–76.8)
Platelets: 170 10*3/uL (ref 145–400)
RBC: 5.24 10*6/uL (ref 3.70–5.45)
RDW: 13.5 % (ref 11.2–14.5)
WBC: 10.7 10*3/uL — ABNORMAL HIGH (ref 3.9–10.3)
lymph#: 3.1 10*3/uL (ref 0.9–3.3)

## 2015-06-20 ENCOUNTER — Encounter: Payer: Self-pay | Admitting: Internal Medicine

## 2015-06-20 ENCOUNTER — Telehealth: Payer: Self-pay | Admitting: Internal Medicine

## 2015-06-20 ENCOUNTER — Ambulatory Visit (HOSPITAL_COMMUNITY)
Admission: RE | Admit: 2015-06-20 | Discharge: 2015-06-20 | Disposition: A | Discharge status: Home / Self Care | Payer: Medicare Other | Source: Ambulatory Visit | Attending: Internal Medicine | Admitting: Internal Medicine

## 2015-06-20 ENCOUNTER — Ambulatory Visit (HOSPITAL_BASED_OUTPATIENT_CLINIC_OR_DEPARTMENT_OTHER): Payer: Medicare Other | Admitting: Internal Medicine

## 2015-06-20 ENCOUNTER — Encounter (HOSPITAL_COMMUNITY): Payer: Self-pay

## 2015-06-20 VITALS — BP 114/43 | HR 81 | Temp 98.2°F | Resp 18 | Ht 63.0 in | Wt 172.2 lb

## 2015-06-20 DIAGNOSIS — I714 Abdominal aortic aneurysm, without rupture: Secondary | ICD-10-CM | POA: Insufficient documentation

## 2015-06-20 DIAGNOSIS — I2510 Atherosclerotic heart disease of native coronary artery without angina pectoris: Secondary | ICD-10-CM | POA: Insufficient documentation

## 2015-06-20 DIAGNOSIS — I700 Atherosclerosis of aorta: Secondary | ICD-10-CM | POA: Insufficient documentation

## 2015-06-20 DIAGNOSIS — R911 Solitary pulmonary nodule: Secondary | ICD-10-CM | POA: Insufficient documentation

## 2015-06-20 DIAGNOSIS — C7931 Secondary malignant neoplasm of brain: Secondary | ICD-10-CM | POA: Diagnosis not present

## 2015-06-20 DIAGNOSIS — C3491 Malignant neoplasm of unspecified part of right bronchus or lung: Secondary | ICD-10-CM

## 2015-06-20 DIAGNOSIS — C3490 Malignant neoplasm of unspecified part of unspecified bronchus or lung: Secondary | ICD-10-CM

## 2015-06-20 DIAGNOSIS — I313 Pericardial effusion (noninflammatory): Secondary | ICD-10-CM | POA: Diagnosis not present

## 2015-06-20 DIAGNOSIS — J439 Emphysema, unspecified: Secondary | ICD-10-CM | POA: Diagnosis not present

## 2015-06-20 DIAGNOSIS — C7949 Secondary malignant neoplasm of other parts of nervous system: Secondary | ICD-10-CM

## 2015-06-20 DIAGNOSIS — R590 Localized enlarged lymph nodes: Secondary | ICD-10-CM | POA: Diagnosis not present

## 2015-06-20 MED ORDER — IOHEXOL 300 MG/ML  SOLN
100.0000 mL | Freq: Once | INTRAMUSCULAR | Status: AC | PRN
  Administered 2015-06-20: 100 mL via INTRAVENOUS

## 2015-06-20 NOTE — Telephone Encounter (Signed)
Scheduled patient appt per pof not able to print AVS report due to missing discharge information.

## 2015-06-20 NOTE — Progress Notes (Signed)
[place] [phone]   [phone]  OFFICE PROGRESS NOTE  No PCP Per Patient No address on file  DIAGNOSIS: Metastatic non-small cell lung cancer, favoring adenocarcinoma diagnosed in July of 2012, presented with locally advanced disease in the chest as well as brain metastasis. Veristrat test poor.   PRIOR THERAPY:  1. Status post stereotactic radiotherapy to 2 brain lesions under the care of Dr. Lisbeth Renshaw. 2. Status post a course of concurrent chemoradiation with weekly carboplatin and paclitaxel, last dose of chemotherapy was given 03/05/2011. 3. Systemic chemotherapy with carboplatin for AUC of 5 and Alimta 500 mg/M2. The patient is status post 3 cycles. Last dose was given 06/03/2011  CURRENT THERAPY: Observation.  INTERVAL HISTORY: Courtney West 64 y.o. female returns to the clinic today for routine six-month followup visit accompanied by friend. The patient is feeling fine today with no specific complaints except for mild fatigue and shortness breath with exertion. Unfortunately she continues to smoke and unwilling to quit smoking even after frequent smoke cessation consultation from me every visit. She denied having any significant chest pain, shortness of breath, cough or hemoptysis. She has no weight loss or night sweats. The patient had repeat CT scan of the chest, abdomen and pelvis performed recently and she is here for evaluation and discussion of her scan results.  MEDICAL HISTORY: Past Medical History  Diagnosis Date  . Hypertension   . COPD (chronic obstructive pulmonary disease) ([place])   . Stroke ([place])   . Abdominal aortic aneurysm ([place])   . Dyslipidemia   . S/P radiation therapy 7/12 thru 9/12, 04/18/11    xrt to brain mets  . Lung cancer ([place]) 12/2010  . Brain cancer ([place])     mets from lung primary  . Allergy     codeine,pcn,sulfa drugs  . Radiation 10/20/2011    frontal mets/Palliation  . History of radiation therapy  01/19/12, 01/21/12, 01/26/12    RLL lung  . History of radiation therapy 04/18/11    single fraction to 6 brain mets  . Clotted vascular catheter ([place]) 03/30/2012  . Lung cancer ([place]) 12/22/2010    ALLERGIES:  is allergic to sulfa antibiotics; carboplatin; codeine; dilaudid; and penicillins.  MEDICATIONS:  Current Outpatient Prescriptions  Medication Sig Dispense Refill  . albuterol (PROVENTIL HFA;VENTOLIN HFA) 108 (90 BASE) MCG/ACT inhaler Inhale 2 puffs into the lungs every 4 (four) hours as needed for wheezing or shortness of breath. 6.7 g 0  . diclofenac sodium (VOLTAREN) 1 % GEL Apply 2 g topically 4 (four) times daily. 100 g 5  . gabapentin (NEURONTIN) 300 MG capsule Take 2 capsules (600 mg total) by mouth 2 (two) times daily. 30 capsule 2  . ibuprofen (ADVIL,MOTRIN) 200 MG tablet Take 800 mg by mouth every 6 (six) hours as needed.    Marland Kitchen ipratropium-albuterol (DUONEB) 0.5-2.5 (3) MG/3ML SOLN Take 3 mLs by nebulization every 6 (six) hours as needed. 360 mL 2  . levofloxacin (LEVAQUIN) 500 MG tablet Take 1 tablet (500 mg total) by mouth daily. 10 tablet 0  . methocarbamol (ROBAXIN) 500 MG tablet Take 1 tablet (500 mg total) by mouth 2 (two) times daily. 30 tablet 0  . predniSONE (DELTASONE) 20 MG tablet     . SYMBICORT 80-4.5 MCG/ACT inhaler INHALE TWO PUFFS INTO LUNGS AS NEEDED (Patient taking differently: INHALE TWO PUFFS INTO LUNGS AS NEEDED for shortness of breath) 2 Inhaler 5  . HYDROcodone-acetaminophen (NORCO/VICODIN) 5-325 MG tablet Take 1  tablet by mouth every 4 (four) hours as needed. (Patient not taking: Reported on 04/26/2015) 15 tablet 0  . rivaroxaban (XARELTO) 20 MG TABS tablet Take 1 tablet (20 mg total) by mouth daily with supper. (Patient not taking: Reported on 06/20/2015) 30 tablet 2   No current facility-administered medications for this visit.    SURGICAL HISTORY:  Past Surgical History  Procedure Laterality Date  . Carpal tunnel release    . Left knee surgery    .  Cholecystectomy    . Partial hysterectomy      REVIEW OF SYSTEMS:  A comprehensive review of systems was negative except for: Constitutional: positive for fatigue Respiratory: positive for dyspnea on exertion   PHYSICAL EXAMINATION: General appearance: alert, cooperative, fatigued and no distress Head: Normocephalic, without obvious abnormality, atraumatic Neck: no adenopathy, no JVD, supple, symmetrical, trachea midline and thyroid not enlarged, symmetric, no tenderness/mass/nodules Lymph nodes: Cervical, supraclavicular, and axillary nodes normal. Resp: wheezes bilaterally Back: symmetric, no curvature. ROM normal. No CVA tenderness. Cardio: regular rate and rhythm, S1, S2 normal, no murmur, click, rub or gallop GI: soft, non-tender; bowel sounds normal; no masses,  no organomegaly Extremities: extremities normal, atraumatic, no cyanosis or edema  ECOG PERFORMANCE STATUS: 1 - Symptomatic but completely ambulatory  Blood pressure 114/43, pulse 81, temperature 98.2 F (36.8 C), temperature source Oral, resp. rate 18, height '5\' 3"'$  (1.6 m), weight 172 lb 3.2 oz (78.109 kg), SpO2 96 %.  LABORATORY DATA: Lab Results  Component Value Date   WBC 10.7* 06/15/2015   HGB 16.2* 06/15/2015   HCT 49.2* 06/15/2015   MCV 94.0 06/15/2015   PLT 170 06/15/2015      Chemistry      Component Value Date/Time   NA 141 06/15/2015 1035   NA 143 12/11/2014 0839   NA 143 12/24/2011 1311   K 4.0 06/15/2015 1035   K 5.0 12/11/2014 0839   K 4.7 12/24/2011 1311   CL 102 06/15/2015 1035   CL 106 10/06/2012 1303   CL 99 12/24/2011 1311   CO2 29 06/15/2015 1035   CO2 28 12/11/2014 0839   CO2 31 12/24/2011 1311   BUN 24* 06/15/2015 1035   BUN 10.9 12/11/2014 0840   BUN 13 12/24/2011 1311   CREATININE 0.96 06/15/2015 1035   CREATININE 0.9 12/11/2014 0840   CREATININE 1.2 12/24/2011 1311      Component Value Date/Time   CALCIUM 9.5 06/15/2015 1035   CALCIUM 9.7 12/11/2014 0839   CALCIUM 10.2  12/24/2011 1311   ALKPHOS 88 06/15/2015 1035   ALKPHOS 93 12/11/2014 0839   ALKPHOS 98* 12/24/2011 1311   AST 13* 06/15/2015 1035   AST 19 12/11/2014 0839   AST 18 12/24/2011 1311   ALT 13* 06/15/2015 1035   ALT 21 12/11/2014 0839   ALT 18 12/24/2011 1311   BILITOT 0.3 06/15/2015 1035   BILITOT 0.42 12/11/2014 0839   BILITOT 0.50 12/24/2011 1311       RADIOGRAPHIC STUDIES: Ct Chest W Contrast  06/20/2015  CLINICAL DATA:  Right lung cancer diagnosed in 2012. Metastatic disease to brain. EXAM: CT CHEST, ABDOMEN, AND PELVIS WITH CONTRAST TECHNIQUE: Multidetector CT imaging of the chest, abdomen and pelvis was performed following the standard protocol during bolus administration of intravenous contrast. CONTRAST:  127m OMNIPAQUE IOHEXOL 300 MG/ML  SOLN COMPARISON:  Multiple exams, including 04/15/2015 and 12/11/2014 FINDINGS: CT CHEST FINDINGS Mediastinum/Nodes: Coronary, aortic arch, and branch vessel atherosclerotic vascular disease. There is some mural thrombus proximally in  the brachiocephalic artery, as before. Large pericardial effusion is again noted. Right hilar node 1 cm in short axis, image 26 series 2, stable. Multiple small paratracheal lymph nodes are again observed. Lungs/Pleura: Emphysema with scarring in the lung apices. Chronic right lower lobe airspace opacity similar to prior. Chronic left perihilar airspace opacity involving the upper and lower lobes, with associated air bronchograms, probably from prior radiation therapy. This is not appreciably changed from 04/15/2015 and is likewise similar to 12/11/2014. Posteriorly in the right upper lobe there is a 5 mm pulmonary nodule on image 17 series 5. This is similar to 9/20 06/2008. Musculoskeletal: Right posterior seventh and eighth rib fractures are present, with adjacent pleural fluid and some rounded complexity in the pleural fluid medially as on image 30 series 5. This complexity is new and could represent calcification of local  hemothorax. The proximity to the fractures makes this more likely to be posttraumatic and less likely to be malignant. CT ABDOMEN PELVIS FINDINGS Hepatobiliary: Cholecystectomy. 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 Adrenals/Urinary Tract: Unremarkable Stomach/Bowel: Unremarkable Vascular/Lymphatic: Aortoiliac atherosclerotic vascular disease. 6.0 by 5.4 cm infrarenal abdominal aortic aneurysm, fusiform morphology, image 71 series 2, previously 5.8 by 5.4 cm by my measurements, moderate amount of mural thrombus. The aneurysm extends to the bifurcation. Peripancreatic lymph nodes are within normal size limits. Reproductive: Uterus absent.  Adnexa unremarkable for age. Other: No supplemental non-categorized findings. Musculoskeletal: Stable appearance of the central L2 compression fracture. IMPRESSION: 1. 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 The possibility of tumor in these areas of consolidation cannot be completely excluded, but there is no convincing progression. 2. Stable large pericardial effusion. 3. Atherosclerosis in the chest and abdomen, with an infrarenal abdominal aortic aneurysm measuring up to 6.0 by 5.4 cm in cross-section, with a moderate amount of mural thrombus. 4. A 5 mm posterior right upper lobe pulmonary nodule is stable from 2010, and quite likely benign. 5. Right posterior seventh and eighth rib fractures are still visible, with adjacent pleural fluid with some complexity. 6. Stable appearance to the L2 compression fracture. Electronically Signed   By: Van Clines M.D.   On: 06/20/2015 09:19   Ct Abdomen Pelvis W Contrast  06/20/2015  CLINICAL DATA:  Right lung cancer diagnosed in 2012. Metastatic disease to brain. EXAM: CT CHEST, ABDOMEN, AND PELVIS WITH CONTRAST TECHNIQUE:  Multidetector CT imaging of the chest, abdomen and pelvis was performed following the standard protocol during bolus administration of intravenous contrast. CONTRAST:  185m OMNIPAQUE IOHEXOL 300 MG/ML  SOLN COMPARISON:  Multiple exams, including 04/15/2015 and 12/11/2014 FINDINGS: CT CHEST FINDINGS Mediastinum/Nodes: Coronary, aortic arch, and branch vessel atherosclerotic vascular disease. There is some mural thrombus proximally in the brachiocephalic artery, as before. Large pericardial effusion is again noted. Right hilar node 1 cm in short axis, image 26 series 2, stable. Multiple small paratracheal lymph nodes are again observed. Lungs/Pleura: Emphysema with scarring in the lung apices. Chronic right lower lobe airspace opacity similar to prior. Chronic left perihilar airspace opacity involving the upper and lower lobes, with associated air bronchograms, probably from prior radiation therapy. This is not appreciably changed from 04/15/2015 and is likewise similar to 12/11/2014. Posteriorly in the right upper lobe there is a 5 mm pulmonary nodule on image 17 series 5. This is similar to 9/20 06/2008. Musculoskeletal: Right posterior seventh and eighth rib fractures are present,  with adjacent pleural fluid and some rounded complexity in the pleural fluid medially as on image 30 series 5. This complexity is new and could represent calcification of local hemothorax. The proximity to the fractures makes this more likely to be posttraumatic and less likely to be malignant. CT ABDOMEN PELVIS FINDINGS Hepatobiliary: Cholecystectomy. 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 Adrenals/Urinary Tract: Unremarkable Stomach/Bowel: Unremarkable Vascular/Lymphatic: Aortoiliac atherosclerotic vascular disease. 6.0 by 5.4 cm infrarenal abdominal aortic aneurysm, fusiform morphology, image 71 series 2, previously 5.8 by 5.4 cm by my  measurements, moderate amount of mural thrombus. The aneurysm extends to the bifurcation. Peripancreatic lymph nodes are within normal size limits. Reproductive: Uterus absent.  Adnexa unremarkable for age. Other: No supplemental non-categorized findings. Musculoskeletal: Stable appearance of the central L2 compression fracture. IMPRESSION: 1. 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 The possibility of tumor in these areas of consolidation cannot be completely excluded, but there is no convincing progression. 2. Stable large pericardial effusion. 3. Atherosclerosis in the chest and abdomen, with an infrarenal abdominal aortic aneurysm measuring up to 6.0 by 5.4 cm in cross-section, with a moderate amount of mural thrombus. 4. A 5 mm posterior right upper lobe pulmonary nodule is stable from 2010, and quite likely benign. 5. Right posterior seventh and eighth rib fractures are still visible, with adjacent pleural fluid with some complexity. 6. Stable appearance to the L2 compression fracture. Electronically Signed   By: Van Clines M.D.   On: 06/20/2015 09:19   ASSESSMENT AND PLAN: This is a very pleasant 64 years old white female with metastatic non-small cell lung cancer, adenocarcinoma status post stereotactic radiotherapy to brain lesion as well as concurrent chemoradiation and consolidation systemic chemotherapy with carboplatin and Alimta status post 3 cycles. The recent CT scan of the chest showed no significant evidence for disease progression. The patient continues to have borderline enlarged right hilar lymph nodes. I discussed the scan results with the patient and her friend. I recommended for her to continue on observation. I would see her back for follow-up visit with repeat CT scan of the chest, abdomen and pelvis in 6 months but she was advised to call immediately if she has any  concerning symptoms. I counseled the patient today about the smoke cessation but unfortunately she is not willing to quit smoking. She was advised to call immediately if she has any concerning symptoms in the interval. The patient voices understanding of current disease status and treatment options and is in agreement with the current care plan. All questions were answered. The patient knows to call the clinic with any problems, questions or concerns. We can certainly see the patient much sooner if necessary.  Disclaimer: This note was dictated with voice recognition software. Similar sounding words can inadvertently be transcribed and may not be corrected upon review.

## 2015-07-02 ENCOUNTER — Ambulatory Visit: Payer: Medicare Other | Admitting: Radiation Oncology

## 2015-07-02 ENCOUNTER — Ambulatory Visit: Admission: RE | Admit: 2015-07-02 | Payer: Medicare Other | Source: Ambulatory Visit | Admitting: Radiation Oncology

## 2015-07-12 ENCOUNTER — Ambulatory Visit
Admission: RE | Admit: 2015-07-12 | Discharge: 2015-07-12 | Disposition: A | Discharge status: Home / Self Care | Payer: Medicare Other | Source: Ambulatory Visit | Attending: Radiation Oncology | Admitting: Radiation Oncology

## 2015-07-12 ENCOUNTER — Encounter: Payer: Self-pay | Admitting: Radiation Oncology

## 2015-07-12 VITALS — BP 130/80 | HR 77 | Resp 16 | Wt 176.8 lb

## 2015-07-12 DIAGNOSIS — C3491 Malignant neoplasm of unspecified part of right bronchus or lung: Secondary | ICD-10-CM

## 2015-07-12 DIAGNOSIS — C7949 Secondary malignant neoplasm of other parts of nervous system: Secondary | ICD-10-CM

## 2015-07-12 DIAGNOSIS — C7931 Secondary malignant neoplasm of brain: Secondary | ICD-10-CM

## 2015-07-12 NOTE — Progress Notes (Signed)
Weight and vital stable. Reports intermittent back pain. Reports she was prescribed Robaxin to manage the pain but, her insurance didn't cover it. Reports a productive cough with clear sputum. Denies SOB or pain associated with swallowing. Denies headache, dizziness, nausea, vomiting, diplopia or ringing in the ears. Denies fatigue.   BP 130/80 mmHg  Pulse 77  Resp 16  Wt 176 lb 12.8 oz (80.196 kg)  SpO2 100% Wt Readings from Last 3 Encounters:  07/12/15 176 lb 12.8 oz (80.196 kg)  06/20/15 172 lb 3.2 oz (78.109 kg)  04/26/15 180 lb 12.8 oz (82.01 kg)

## 2015-07-12 NOTE — Progress Notes (Signed)
Radiation Oncology         [phone] ________________________________  Name: Courtney West MRN: [id number]  Date: 07/12/2015  DOB: 08-29-1950  Follow-Up Visit Note  CC: No PCP Per Patient  Shirline Frees, MD  Diagnosis:  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   Interval Since Last Radiation:  3 years, 5 months  Narrative: [name] is a pleasant patient with a history of a right lung cancer, who comes today for follow-up. She is status  post: 1. SRS to a 10-mm left frontal and 5-mm right frontal brain metastasis to 20 Gy on January 16, 2011.  2. The patient's primary lung cancer was treated to 66 Gy in 33 fractions of 2 Gy through March 10, 2011 . 3. SRS to 6 new brain metastases (2 adjacent left temporal brain metastases, a 4.7-mm vermis target, a 3.4-mm right frontal brain metastasis, a left parietal 5.3-mm brain metastasis, and a 6.6-mm left cerebellar brain metastasis) received 20 Gy in a single fraction.April 18, 2011. 4. SRS to a 4 mm posterior frontal target to 20 Gy on 10/20/2011 SBRT to a 1.6 cm metachronous clinical stage IA primary cancer of the right lower lung on 01/19/12, 01/21/12, and 01/26/12 to 54 gray in 3 fractions of 18 gr She's been followed closely since her original treatment, however has refused recent MRI, and is followed clinically.  ROS: on review of systems the patient reports that she is doing pretty well overall. She continues to have intermittent low back pain and states that she has been prescribed Robaxin. Unfortunately her insurance does not cover this she is to follow up with her primary care provider about this. She's had a slightly productive cough with clear sputum but denies any increasing symptoms, shortness of breath or pain. She denies any difficulty with unintended weight changes, fevers or chills. A complete review of systems is obtained and is otherwise negative.                  ALLERGIES:   is allergic to sulfa antibiotics; carboplatin; codeine; dilaudid; and penicillins.  Meds: Current Outpatient Prescriptions  Medication Sig Dispense Refill  . albuterol (PROVENTIL HFA;VENTOLIN HFA) 108 (90 BASE) MCG/ACT inhaler Inhale 2 puffs into the lungs every 4 (four) hours as needed for wheezing or shortness of breath. 6.7 g 0  . gabapentin (NEURONTIN) 300 MG capsule Take 2 capsules (600 mg total) by mouth 2 (two) times daily. 30 capsule 2  . ibuprofen (ADVIL,MOTRIN) 200 MG tablet Take 800 mg by mouth every 6 (six) hours as needed.    Marland Kitchen ipratropium-albuterol (DUONEB) 0.5-2.5 (3) MG/3ML SOLN Take 3 mLs by nebulization every 6 (six) hours as needed. 360 mL 2  . SYMBICORT 80-4.5 MCG/ACT inhaler INHALE TWO PUFFS INTO LUNGS AS NEEDED (Patient taking differently: INHALE TWO PUFFS INTO LUNGS AS NEEDED for shortness of breath) 2 Inhaler 5  . HYDROcodone-acetaminophen (NORCO/VICODIN) 5-325 MG tablet Take 1 tablet by mouth every 4 (four) hours as needed. (Patient not taking: Reported on 04/26/2015) 15 tablet 0  . methocarbamol (ROBAXIN) 500 MG tablet Take 1 tablet (500 mg total) by mouth 2 (two) times daily. (Patient not taking: Reported on 07/12/2015) 30 tablet 0  . rivaroxaban (XARELTO) 20 MG TABS tablet Take 1 tablet (20 mg total) by mouth daily with supper. (Patient not taking: Reported on 06/20/2015) 30 tablet 2   No current facility-administered medications for this encounter.  Physical Findings:  weight is 176 lb 12.8 oz (80.196 kg). Her blood pressure is 130/80 and her pulse is 77. Her respiration is 16 and oxygen saturation is 100%.   Pain scale 0/10 In general this is a well-appearing Caucasian female in no acute distress. She is alert and oriented 4 in appropriate examination. Cardiorespiratory assessment reveals normal effort without any acute distress. From a neurologic standpoint she appears to be grossly intact without focal abnormalities.  Lab Findings: Lab Results  Component  Value Date   WBC 10.7* 06/15/2015   WBC 10.4 04/14/2015   HGB 16.2* 06/15/2015   HGB 14.3 04/14/2015   HCT 49.2* 06/15/2015   HCT 41.6 04/14/2015   PLT 170 06/15/2015   PLT 246 04/14/2015    Lab Results  Component Value Date   NA 141 06/15/2015   NA 143 12/11/2014   NA 143 12/24/2011   K 4.0 06/15/2015   K 5.0 12/11/2014   K 4.7 12/24/2011   CHLORIDE 104 12/11/2014   CO2 29 06/15/2015   CO2 28 12/11/2014   CO2 31 12/24/2011   GLUCOSE 111* 06/15/2015   GLUCOSE 103 12/11/2014   GLUCOSE 102* 10/06/2012   GLUCOSE 113 12/24/2011   BUN 24* 06/15/2015   BUN 10.9 12/11/2014   BUN 13 12/24/2011   CREATININE 0.96 06/15/2015   CREATININE 0.9 12/11/2014   CREATININE 1.2 12/24/2011   BILITOT 0.3 06/15/2015   BILITOT 0.42 12/11/2014   BILITOT 0.50 12/24/2011   ALKPHOS 88 06/15/2015   ALKPHOS 93 12/11/2014   ALKPHOS 98* 12/24/2011   AST 13* 06/15/2015   AST 19 12/11/2014   AST 18 12/24/2011   ALT 13* 06/15/2015   ALT 21 12/11/2014   ALT 18 12/24/2011   PROT 7.1 06/15/2015   PROT 7.3 12/11/2014   PROT 7.6 12/24/2011   ALBUMIN 4.0 06/15/2015   ALBUMIN 3.8 12/11/2014   ALBUMIN 3.5 12/24/2011   CALCIUM 9.5 06/15/2015   CALCIUM 9.7 12/11/2014   CALCIUM 10.2 12/24/2011   ANIONGAP 10 06/15/2015   ANIONGAP 11 12/11/2014    Radiographic Findings: Ct Chest W Contrast  06/20/2015  CLINICAL DATA:  Right lung cancer diagnosed in 2012. Metastatic disease to brain. EXAM: CT CHEST, ABDOMEN, AND PELVIS WITH CONTRAST TECHNIQUE: Multidetector CT imaging of the chest, abdomen and pelvis was performed following the standard protocol during bolus administration of intravenous contrast. CONTRAST:  150m OMNIPAQUE IOHEXOL 300 MG/ML  SOLN COMPARISON:  Multiple exams, including 04/15/2015 and 12/11/2014 FINDINGS: CT CHEST FINDINGS Mediastinum/Nodes: Coronary, aortic arch, and branch vessel atherosclerotic vascular disease. There is some mural thrombus proximally in the brachiocephalic artery, as  before. Large pericardial effusion is again noted. Right hilar node 1 cm in short axis, image 26 series 2, stable. Multiple small paratracheal lymph nodes are again observed. Lungs/Pleura: Emphysema with scarring in the lung apices. Chronic right lower lobe airspace opacity similar to prior. Chronic left perihilar airspace opacity involving the upper and lower lobes, with associated air bronchograms, probably from prior radiation therapy. This is not appreciably changed from 04/15/2015 and is likewise similar to 12/11/2014. Posteriorly in the right upper lobe there is a 5 mm pulmonary nodule on image 17 series 5. This is similar to 9/20 06/2008. Musculoskeletal: Right posterior seventh and eighth rib fractures are present, with adjacent pleural fluid and some rounded complexity in the pleural fluid medially as on image 30 series 5. This complexity is new and could represent calcification of local hemothorax. The proximity to the fractures makes this more  likely to be posttraumatic and less likely to be malignant. CT ABDOMEN PELVIS FINDINGS Hepatobiliary: Cholecystectomy. 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 Adrenals/Urinary Tract: Unremarkable Stomach/Bowel: Unremarkable Vascular/Lymphatic: Aortoiliac atherosclerotic vascular disease. 6.0 by 5.4 cm infrarenal abdominal aortic aneurysm, fusiform morphology, image 71 series 2, previously 5.8 by 5.4 cm by my measurements, moderate amount of mural thrombus. The aneurysm extends to the bifurcation. Peripancreatic lymph nodes are within normal size limits. Reproductive: Uterus absent.  Adnexa unremarkable for age. Other: No supplemental non-categorized findings. Musculoskeletal: Stable appearance of the central L2 compression fracture. IMPRESSION: 1. Borderline enlarged right hilar lymph node with right lower lobe and left perihilar chronic airspace opacity likely from radiation  fibrosis/pneumonitis. The possibility of tumor in these areas of consolidation cannot be completely excluded, but there is no convincing progression. 2. Stable large pericardial effusion. 3. Atherosclerosis in the chest and abdomen, with an infrarenal abdominal aortic aneurysm measuring up to 6.0 by 5.4 cm in cross-section, with a moderate amount of mural thrombus. 4. A 5 mm posterior right upper lobe pulmonary nodule is stable from 2010, and quite likely benign. 5. Right posterior seventh and eighth rib fractures are still visible, with adjacent pleural fluid with some complexity. 6. Stable appearance to the L2 compression fracture. Electronically Signed   By: Van Clines M.D.   On: 06/20/2015 09:19   Ct Abdomen Pelvis W Contrast  06/20/2015  CLINICAL DATA:  Right lung cancer diagnosed in 2012. Metastatic disease to brain. EXAM: CT CHEST, ABDOMEN, AND PELVIS WITH CONTRAST TECHNIQUE: Multidetector CT imaging of the chest, abdomen and pelvis was performed following the standard protocol during bolus administration of intravenous contrast. CONTRAST:  165m OMNIPAQUE IOHEXOL 300 MG/ML  SOLN COMPARISON:  Multiple exams, including 04/15/2015 and 12/11/2014 FINDINGS: CT CHEST FINDINGS Mediastinum/Nodes: Coronary, aortic arch, and branch vessel atherosclerotic vascular disease. There is some mural thrombus proximally in the brachiocephalic artery, as before. Large pericardial effusion is again noted. Right hilar node 1 cm in short axis, image 26 series 2, stable. Multiple small paratracheal lymph nodes are again observed. Lungs/Pleura: Emphysema with scarring in the lung apices. Chronic right lower lobe airspace opacity similar to prior. Chronic left perihilar airspace opacity involving the upper and lower lobes, with associated air bronchograms, probably from prior radiation therapy. This is not appreciably changed from 04/15/2015 and is likewise similar to 12/11/2014. Posteriorly in the right upper lobe there  is a 5 mm pulmonary nodule on image 17 series 5. This is similar to 9/20 06/2008. Musculoskeletal: Right posterior seventh and eighth rib fractures are present, with adjacent pleural fluid and some rounded complexity in the pleural fluid medially as on image 30 series 5. This complexity is new and could represent calcification of local hemothorax. The proximity to the fractures makes this more likely to be posttraumatic and less likely to be malignant. CT ABDOMEN PELVIS FINDINGS Hepatobiliary: Cholecystectomy. 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 Adrenals/Urinary Tract: Unremarkable Stomach/Bowel: Unremarkable Vascular/Lymphatic: Aortoiliac atherosclerotic vascular disease. 6.0 by 5.4 cm infrarenal abdominal aortic aneurysm, fusiform morphology, image 71 series 2, previously 5.8 by 5.4 cm by my measurements, moderate amount of mural thrombus. The aneurysm extends to the bifurcation. Peripancreatic lymph nodes are within normal size limits. Reproductive: Uterus absent.  Adnexa unremarkable for age. Other: No supplemental non-categorized findings. Musculoskeletal: Stable appearance of the central L2 compression fracture. IMPRESSION: 1. Borderline enlarged right hilar lymph node with right lower lobe  and left perihilar chronic airspace opacity likely from radiation fibrosis/pneumonitis. The possibility of tumor in these areas of consolidation cannot be completely excluded, but there is no convincing progression. 2. Stable large pericardial effusion. 3. Atherosclerosis in the chest and abdomen, with an infrarenal abdominal aortic aneurysm measuring up to 6.0 by 5.4 cm in cross-section, with a moderate amount of mural thrombus. 4. A 5 mm posterior right upper lobe pulmonary nodule is stable from 2010, and quite likely benign. 5. Right posterior seventh and eighth rib fractures are still visible, with adjacent pleural fluid with  some complexity. 6. Stable appearance to the L2 compression fracture. Electronically Signed   By: Van Clines M.D.   On: 06/20/2015 09:19    Impression:  S/P SRS to the brain and SBRT to the right lung for metastatic lung cancer  Plan:   The patient appears to be clinically doing well. She denies any new symptoms that are of concern. We will continue to follow her expectantly at six-month intervals. As she is previously requested we will not E MRI however if she changes her mind or if she becomes clinically symptomatic, we would recommend additional workup. She states understanding of this.   The above documentation reflects my direct findings during this shared patient visit. Please see the separate note by Dr. Tammi Klippel on this date for the remainder of the patient's plan of care.  Carola Rhine, PAC

## 2015-07-13 NOTE — Patient Instructions (Signed)
Contact our office if you have any questions following today's appointment: [phone].  

## 2015-07-31 ENCOUNTER — Other Ambulatory Visit: Payer: Self-pay | Admitting: Radiation Oncology

## 2015-08-08 ENCOUNTER — Emergency Department (HOSPITAL_COMMUNITY): Payer: Medicare Other

## 2015-08-08 ENCOUNTER — Encounter (HOSPITAL_COMMUNITY): Payer: Self-pay | Admitting: Emergency Medicine

## 2015-08-08 ENCOUNTER — Inpatient Hospital Stay (HOSPITAL_COMMUNITY)
Admission: EM | Admit: 2015-08-08 | Discharge: 2015-08-11 | DRG: 190 | Disposition: A | Discharge status: Home / Self Care | Payer: Medicare Other | Attending: Internal Medicine | Admitting: Internal Medicine

## 2015-08-08 DIAGNOSIS — J441 Chronic obstructive pulmonary disease with (acute) exacerbation: Secondary | ICD-10-CM | POA: Diagnosis present

## 2015-08-08 DIAGNOSIS — C7931 Secondary malignant neoplasm of brain: Secondary | ICD-10-CM | POA: Diagnosis present

## 2015-08-08 DIAGNOSIS — C7949 Secondary malignant neoplasm of other parts of nervous system: Secondary | ICD-10-CM

## 2015-08-08 DIAGNOSIS — J9601 Acute respiratory failure with hypoxia: Secondary | ICD-10-CM | POA: Diagnosis present

## 2015-08-08 DIAGNOSIS — R0902 Hypoxemia: Secondary | ICD-10-CM | POA: Diagnosis not present

## 2015-08-08 DIAGNOSIS — I10 Essential (primary) hypertension: Secondary | ICD-10-CM | POA: Diagnosis present

## 2015-08-08 DIAGNOSIS — C3491 Malignant neoplasm of unspecified part of right bronchus or lung: Secondary | ICD-10-CM | POA: Diagnosis present

## 2015-08-08 LAB — CBC WITH DIFFERENTIAL/PLATELET
BASOS PCT: 1 %
Basophils Absolute: 0 10*3/uL (ref 0.0–0.1)
EOS ABS: 0.2 10*3/uL (ref 0.0–0.7)
Eosinophils Relative: 2 %
HCT: 46 % (ref 36.0–46.0)
Hemoglobin: 14.8 g/dL (ref 12.0–15.0)
Lymphocytes Relative: 25 %
Lymphs Abs: 2.1 10*3/uL (ref 0.7–4.0)
MCH: 30.2 pg (ref 26.0–34.0)
MCHC: 32.2 g/dL (ref 30.0–36.0)
MCV: 93.9 fL (ref 78.0–100.0)
MONO ABS: 0.6 10*3/uL (ref 0.1–1.0)
MONOS PCT: 7 %
NEUTROS PCT: 65 %
Neutro Abs: 5.5 10*3/uL (ref 1.7–7.7)
Platelets: 193 10*3/uL (ref 150–400)
RBC: 4.9 MIL/uL (ref 3.87–5.11)
RDW: 13.4 % (ref 11.5–15.5)
WBC: 8.4 10*3/uL (ref 4.0–10.5)

## 2015-08-08 LAB — BASIC METABOLIC PANEL
Anion gap: 5 (ref 5–15)
BUN: 13 mg/dL (ref 6–20)
CALCIUM: 9.1 mg/dL (ref 8.9–10.3)
CO2: 28 mmol/L (ref 22–32)
CREATININE: 0.74 mg/dL (ref 0.44–1.00)
Chloride: 109 mmol/L (ref 101–111)
GFR calc non Af Amer: >60 mL/min (ref >60)
Glucose, Bld: 93 mg/dL (ref 65–99)
Potassium: 4.3 mmol/L (ref 3.5–5.1)
SODIUM: 142 mmol/L (ref 135–145)

## 2015-08-08 NOTE — ED Notes (Signed)
Patient complaining of cough x 2 weeks and upper abdominal pain starting last night. States "I have stage 4 lung and brain cancer so I cough and hurt in my chest all the time but I'm hurting in my upper abdomen and it seems to be worse."

## 2015-08-09 ENCOUNTER — Encounter (HOSPITAL_COMMUNITY): Payer: Self-pay | Admitting: Family Medicine

## 2015-08-09 DIAGNOSIS — I10 Essential (primary) hypertension: Secondary | ICD-10-CM

## 2015-08-09 DIAGNOSIS — J9601 Acute respiratory failure with hypoxia: Secondary | ICD-10-CM | POA: Insufficient documentation

## 2015-08-09 DIAGNOSIS — R0902 Hypoxemia: Secondary | ICD-10-CM | POA: Diagnosis present

## 2015-08-09 DIAGNOSIS — C7949 Secondary malignant neoplasm of other parts of nervous system: Secondary | ICD-10-CM

## 2015-08-09 DIAGNOSIS — J441 Chronic obstructive pulmonary disease with (acute) exacerbation: Secondary | ICD-10-CM | POA: Diagnosis present

## 2015-08-09 DIAGNOSIS — C3491 Malignant neoplasm of unspecified part of right bronchus or lung: Secondary | ICD-10-CM | POA: Diagnosis not present

## 2015-08-09 DIAGNOSIS — C7931 Secondary malignant neoplasm of brain: Secondary | ICD-10-CM | POA: Diagnosis present

## 2015-08-09 LAB — GLUCOSE, CAPILLARY: Glucose-Capillary: 270 mg/dL — ABNORMAL HIGH (ref 65–99)

## 2015-08-09 LAB — PROCALCITONIN

## 2015-08-09 MED ORDER — ALBUTEROL (5 MG/ML) CONTINUOUS INHALATION SOLN
10.0000 mg/h | INHALATION_SOLUTION | Freq: Once | RESPIRATORY_TRACT | Status: AC
  Administered 2015-08-09: 10 mg/h via RESPIRATORY_TRACT
  Filled 2015-08-09: qty 20

## 2015-08-09 MED ORDER — BISACODYL 5 MG PO TBEC: 5.0000 mg | DELAYED_RELEASE_TABLET | Freq: Every day | ORAL | Status: DC | PRN

## 2015-08-09 MED ORDER — ENOXAPARIN SODIUM 40 MG/0.4ML ~~LOC~~ SOLN
40.0000 mg | SUBCUTANEOUS | Status: DC
  Administered 2015-08-10: 40 mg via SUBCUTANEOUS
  Filled 2015-08-09 (×2): qty 0.4

## 2015-08-09 MED ORDER — POLYETHYLENE GLYCOL 3350 17 G PO PACK: 17.0000 g | PACK | Freq: Every day | ORAL | Status: DC | PRN

## 2015-08-09 MED ORDER — GUAIFENESIN-CODEINE 100-10 MG/5ML PO SOLN
5.0000 mL | ORAL | Status: DC | PRN
  Administered 2015-08-09 – 2015-08-10 (×3): 5 mL via ORAL
  Filled 2015-08-09 (×3): qty 5

## 2015-08-09 MED ORDER — GABAPENTIN 300 MG PO CAPS
600.0000 mg | ORAL_CAPSULE | Freq: Two times a day (BID) | ORAL | Status: DC
  Administered 2015-08-09 – 2015-08-11 (×4): 600 mg via ORAL
  Filled 2015-08-09 (×6): qty 2

## 2015-08-09 MED ORDER — ONDANSETRON HCL 4 MG/2ML IJ SOLN: 4.0000 mg | Freq: Four times a day (QID) | INTRAMUSCULAR | Status: DC | PRN

## 2015-08-09 MED ORDER — IPRATROPIUM-ALBUTEROL 0.5-2.5 (3) MG/3ML IN SOLN
3.0000 mL | Freq: Four times a day (QID) | RESPIRATORY_TRACT | Status: DC
  Administered 2015-08-09 – 2015-08-11 (×8): 3 mL via RESPIRATORY_TRACT
  Filled 2015-08-09 (×7): qty 3

## 2015-08-09 MED ORDER — LEVOFLOXACIN IN D5W 750 MG/150ML IV SOLN
750.0000 mg | INTRAVENOUS | Status: DC
  Administered 2015-08-10: 750 mg via INTRAVENOUS
  Filled 2015-08-09: qty 150

## 2015-08-09 MED ORDER — IPRATROPIUM-ALBUTEROL 0.5-2.5 (3) MG/3ML IN SOLN: 3.0000 mL | RESPIRATORY_TRACT | Status: DC | PRN

## 2015-08-09 MED ORDER — METHYLPREDNISOLONE SODIUM SUCC 125 MG IJ SOLR
60.0000 mg | Freq: Four times a day (QID) | INTRAMUSCULAR | Status: DC
  Administered 2015-08-09 – 2015-08-11 (×8): 60 mg via INTRAVENOUS
  Filled 2015-08-09 (×9): qty 2

## 2015-08-09 MED ORDER — ALBUTEROL (5 MG/ML) CONTINUOUS INHALATION SOLN
10.0000 mg/h | INHALATION_SOLUTION | Freq: Once | RESPIRATORY_TRACT | Status: AC
  Administered 2015-08-09: 10 mg/h via RESPIRATORY_TRACT

## 2015-08-09 MED ORDER — IPRATROPIUM BROMIDE 0.02 % IN SOLN
0.5000 mg | Freq: Once | RESPIRATORY_TRACT | Status: AC
  Administered 2015-08-09: 0.5 mg via RESPIRATORY_TRACT
  Filled 2015-08-09: qty 2.5

## 2015-08-09 MED ORDER — LEVOFLOXACIN IN D5W 500 MG/100ML IV SOLN
500.0000 mg | Freq: Once | INTRAVENOUS | Status: AC
  Administered 2015-08-09: 500 mg via INTRAVENOUS
  Filled 2015-08-09: qty 100

## 2015-08-09 MED ORDER — BUDESONIDE-FORMOTEROL FUMARATE 80-4.5 MCG/ACT IN AERO
2.0000 | INHALATION_SPRAY | Freq: Two times a day (BID) | RESPIRATORY_TRACT | Status: DC
  Administered 2015-08-09 – 2015-08-11 (×4): 2 via RESPIRATORY_TRACT
  Filled 2015-08-09: qty 6.9

## 2015-08-09 MED ORDER — IBUPROFEN 600 MG PO TABS: 600.0000 mg | ORAL_TABLET | Freq: Four times a day (QID) | ORAL | Status: DC | PRN

## 2015-08-09 MED ORDER — ALBUTEROL SULFATE (2.5 MG/3ML) 0.083% IN NEBU: 5.0000 mg | INHALATION_SOLUTION | RESPIRATORY_TRACT | Status: AC | PRN

## 2015-08-09 MED ORDER — METHYLPREDNISOLONE SODIUM SUCC 125 MG IJ SOLR
125.0000 mg | Freq: Once | INTRAMUSCULAR | Status: AC
  Administered 2015-08-09: 125 mg via INTRAVENOUS
  Filled 2015-08-09: qty 2

## 2015-08-09 MED ORDER — ONDANSETRON HCL 4 MG PO TABS: 4.0000 mg | ORAL_TABLET | Freq: Four times a day (QID) | ORAL | Status: DC | PRN

## 2015-08-09 NOTE — Progress Notes (Signed)
OT Screen Patient Details Name: CHRISS REDEL MRN: [id number] DOB: 1950-08-07   OT Screen:     Reason evaluation not completed: Chart reviewed, pt screened for OT needs. Pt is at baseline with strength and overall functioning, independent in ADL tasks. No further acute OT services required at this time.   Guadelupe Sabin, OTR/L  [phone]  08/09/2015, 9:22 AM

## 2015-08-09 NOTE — Progress Notes (Signed)
Initial Nutrition Assessment  DOCUMENTATION CODES:   Obesity unspecified  INTERVENTION:  -Continue monitor PO intake will reevaluate for nutritional needs, supplemental needs, or nutrition education needs.   NUTRITION DIAGNOSIS:   Increased nutrient needs related to catabolic illness as evidenced by estimated needs.  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    Pt seen for nutrition assessment consult. Pt reports a good appetite, stating she eats "like a horse". Per chart pt is consuming 100% of meals. PTA her usual diet consist of eating 4-5, maybe 6 times a day. She reports no use of supplements or supplemental nutrition drinks at home.   Pt states she has lost 30 lb (14%) within a year, this is not significant for time frame. Chart supports pt's reported weight loss. Pts underlying cancer likely has  Contributed to weight loss.    Conducted nutrition focused physical exam, identified no muscle and fat wasting.  Medications reviewed. Labs reviewed.  Diet Order:  Diet regular Room service appropriate?: Yes; Fluid consistency:: Thin  Skin:  Reviewed, no issues  Last BM:  08/08/2015  Height:   Ht Readings from Last 1 Encounters:  08/09/15 '5\' 3"'$  (1.6 m)    Weight:   Wt Readings from Last 1 Encounters:  08/09/15 174 lb 8 oz (79.153 kg)    Ideal Body Weight:  52.2 kg  BMI:  Body mass index is 30.92 kg/(m^2).  Estimated Nutritional Needs:   Kcal:  1500-1700  Protein:  65-75  Fluid:  1.5-1.7 L   EDUCATION NEEDS:   No education needs identified at this  time  [place], Dietetic Intern Pager: [phone]

## 2015-08-09 NOTE — Progress Notes (Signed)
Pharmacy Antibiotic Note  Courtney West is a 65 y.o. female admitted on 08/08/2015 with pneumonia.  Pharmacy has been consulted for levaquin dosing.  Plan: Levaquin 500 mg given this am.  Will continue levaquin 750 mg IV q24 hours.  F/u renal function, cultures and clinical course  Height: '5\' 3"'$  (160 cm) Weight: 174 lb 8 oz (79.153 kg) IBW/kg (Calculated) : 52.4  Temp (24hrs), Avg:98.3 F (36.8 C), Min:98 F (36.7 C), Max:98.6 F (37 C)   Recent Labs Lab 08/08/15 2054  WBC 8.4  CREATININE 0.74    Estimated Creatinine Clearance: 70.8 mL/min (by C-G formula based on Cr of 0.74).    Allergies  Allergen Reactions  . Sulfa Antibiotics Nausea Only  . Carboplatin Itching  . Codeine Nausea And Vomiting  . Dilaudid [Hydromorphone Hcl] Nausea Only  . Penicillins Itching and Rash    Has patient had a PCN reaction causing immediate rash, facial/tongue/throat swelling, SOB or lightheadedness with hypotension: No Has patient had a PCN reaction causing severe rash involving mucus membranes or skin necrosis: No Has patient had a PCN reaction that required hospitalization No Has patient had a PCN reaction occurring within the last 10 years: No If all of the above answers are "NO", then may proceed with Cephalosporin use.     Antimicrobials this admission: Levaquin  2/16 >>    Thank you for allowing pharmacy to be a part of this patient's care.  Beverlee Nims 08/09/2015 8:58 AM

## 2015-08-09 NOTE — Progress Notes (Signed)
PT Cancellation Note  Patient Details Name: Courtney West MRN: [id number] DOB: [birth year]   Cancelled Treatment:    Reason Eval/Treat Not Completed: PT screened, no needs identified, will sign off.  Pt reports she does not have any weakness or issues getting around her room. PT performed gross balance and mobility assessment, pt has good static and dynamic standing balance (EO/EC/functinoal reach >7"), and is currently indep with all bed mobility, transfers and ambulation. No skilled PT necessary at this time, will sign off.     9:21 AM,08/09/2015 Elly Modena PT, [facility] [phone]

## 2015-08-09 NOTE — ED Provider Notes (Signed)
CSN: [id number]     Arrival date & time 08/08/15  2017 History  By signing my name below, I, Rohini Rajnarayanan, attest that this documentation has been prepared under the direction and in the presence of Rolland Porter, MD at 0001 Electronically Signed: Evonnie Dawes, ED Scribe 08/09/2015 at 12:27 AM.    Chief Complaint  Patient presents with  . Cough  . Abdominal Pain   The history is provided by the patient. No language interpreter was used.   HPI Comments: Courtney West is a 65 y.o. female with a hx of HTN, COPD, dyslipidemia, lung cancer, brain cancer, stroke, and AAA, and presents to the Emergency Department complaining of non-productive cough x2 weeks, difficulty breathing x5 days, wheezing, rhinorrhea, and gradual onset, dull, persistent, constant,upper abdominal pain/lower chest pain  that is exacerbated by coughing, which began last night. Pt also admits to a slight sore throat. Pt has a nebulizer and inhaler which she has been using with little relief. Pt denies having any similar sx in the past. Pt denies any fever, vomiting, diarrhea, Pt is a current every day smoker - 1 PPD for 50 years.  PCP none Oncology Dr Julien Nordmann  Past Medical History  Diagnosis Date  . Hypertension   . COPD (chronic obstructive pulmonary disease) ([place])   . Stroke ([place])   . Abdominal aortic aneurysm ([place])   . Dyslipidemia   . S/P radiation therapy 7/12 thru 9/12, 04/18/11    xrt to brain mets  . Lung cancer ([place]) 12/2010  . Brain cancer ([place])     mets from lung primary  . Allergy     codeine,pcn,sulfa drugs  . Radiation 10/20/2011    frontal mets/Palliation  . History of radiation therapy 01/19/12, 01/21/12, 01/26/12    RLL lung  . History of radiation therapy 04/18/11    single fraction to 6 brain mets  . Clotted vascular catheter ([place]) 03/30/2012  . Lung cancer ([place]) 12/22/2010   Past Surgical History  Procedure Laterality Date  . Carpal tunnel release    . Left knee surgery    . Cholecystectomy     . Partial hysterectomy     Family History  Problem Relation Age of Onset  . Cancer Father     lung, leukemia  . Cancer Maternal Aunt     kidney   Social History  Substance Use Topics  . Smoking status: Current Every Day Smoker -- 1.00 packs/day for 50 years    Types: Cigarettes  . Smokeless tobacco: None  . Alcohol Use: No   Smokes 1 ppd On disability  OB History    No data available     Review of Systems  HENT: Positive for rhinorrhea and sore throat.   Respiratory: Positive for cough, shortness of breath and wheezing. Chest tightness: tenderness.   Gastrointestinal: Positive for abdominal pain.  All other systems reviewed and are negative.  Allergies  Sulfa antibiotics; Carboplatin; Codeine; Dilaudid; and Penicillins  Home Medications   Prior to Admission medications   Medication Sig Start Date End Date Taking? Authorizing Provider  albuterol (PROVENTIL HFA;VENTOLIN HFA) 108 (90 BASE) MCG/ACT inhaler Inhale 2 puffs into the lungs every 4 (four) hours as needed for wheezing or shortness of breath. 12/20/14   Tyler Pita, MD  gabapentin (NEURONTIN) 300 MG capsule Take 2 capsules (600 mg total) by mouth 2 (two) times daily. 03/28/15   Oswald Hillock, MD  HYDROcodone-acetaminophen (NORCO/VICODIN) 5-325 MG tablet Take 1 tablet by mouth every 4 (four)  hours as needed. Patient not taking: Reported on 04/26/2015 04/15/15   Kristen N Ward, DO  ibuprofen (ADVIL,MOTRIN) 200 MG tablet Take 800 mg by mouth every 6 (six) hours as needed.    Historical Provider, MD  ipratropium-albuterol (DUONEB) 0.5-2.5 (3) MG/3ML SOLN Take 3 mLs by nebulization every 6 (six) hours as needed. 03/28/15   Oswald Hillock, MD  methocarbamol (ROBAXIN) 500 MG tablet Take 1 tablet (500 mg total) by mouth 2 (two) times daily. Patient not taking: Reported on 07/12/2015 04/26/15   Maryanna Shape, NP  rivaroxaban (XARELTO) 20 MG TABS tablet Take 1 tablet (20 mg total) by mouth daily with supper. Patient not  taking: Reported on 06/20/2015 04/17/15   Oswald Hillock, MD  SYMBICORT 80-4.5 MCG/ACT inhaler INHALE TWO PUFFS BY MOUTH AS NEEDED 07/31/15   Hayden Pedro, PA-C   BP 155/81 mmHg  Pulse 80  Temp(Src) 98.1 F (36.7 C)  Resp 21  Ht '5\' 3"'$  (1.6 m)  Wt 176 lb (79.833 kg)  BMI 31.18 kg/m2  SpO2 91%  Vital signs normal with borderline hypoxia  Physical Exam  Constitutional: She is oriented to person, place, and time. She appears well-developed and well-nourished.  Non-toxic appearance. She does not appear ill. No distress.  HENT:  Head: Normocephalic and atraumatic.  Right Ear: External ear normal.  Left Ear: External ear normal.  Nose: Nose normal. No mucosal edema or rhinorrhea.  Mouth/Throat: Oropharynx is clear and moist and mucous membranes are normal. No dental abscesses or uvula swelling.  Eyes: Conjunctivae and EOM are normal. Pupils are equal, round, and reactive to light.  Neck: Normal range of motion and full passive range of motion without pain. Neck supple.  Cardiovascular: Normal rate, regular rhythm and normal heart sounds.  Exam reveals no gallop and no friction rub.   No murmur heard. Pulmonary/Chest: Effort normal. No respiratory distress. She has decreased breath sounds. She has wheezes. She has no rhonchi. She has no rales. She exhibits no tenderness and no crepitus.    Tender on the xyphoid process. Decreased breath sounds. Wheezing. Coughing frequently.   Abdominal: Soft. Normal appearance and bowel sounds are normal. She exhibits no distension. There is no tenderness. There is no rebound and no guarding.  Musculoskeletal: Normal range of motion. She exhibits no edema or tenderness.  Moves all extremities well.   Neurological: She is alert and oriented to person, place, and time. She has normal strength. No cranial nerve deficit.  Skin: Skin is warm, dry and intact. No rash noted. No erythema. No pallor.  Psychiatric: She has a normal mood and affect. Her speech  is normal and behavior is normal. Her mood appears not anxious.  Nursing note and vitals reviewed.   ED Course  Procedures   Medications  levofloxacin (LEVAQUIN) IVPB 500 mg (500 mg Intravenous New Bag/Given 08/09/15 0459)  albuterol (PROVENTIL,VENTOLIN) solution continuous neb (10 mg/hr Nebulization Given 08/09/15 0029)  ipratropium (ATROVENT) nebulizer solution 0.5 mg (0.5 mg Nebulization Given 08/09/15 0029)  methylPREDNISolone sodium succinate (SOLU-MEDROL) 125 mg/2 mL injection 125 mg (125 mg Intravenous Given 08/09/15 0021)  albuterol (PROVENTIL,VENTOLIN) solution continuous neb (10 mg/hr Nebulization Given 08/09/15 0257)  ipratropium (ATROVENT) nebulizer solution 0.5 mg (0.5 mg Nebulization Given 08/09/15 0257)    DIAGNOSTIC STUDIES: Oxygen Saturation is 91% on RA, low by my interpretation.    COORDINATION OF CARE: 12:06 AM-Discussed treatment plan which includes DG Chest, blood work and EKG, as well as albuterol, ipratropium, and SOLU-MEDROL injection  with pt at bedside and pt agreed to plan.   Patient was started on a continuous nebulizer for her wheezing, she was also given IV steroids.  She was rechecked at 02:30 and she still has diffuse scattered wheezing. Her continuous nebulizer was repeated.  I rechecked her again at 4:35 AM she states her breathing is a little bit better. However when I listen to her she still has scattered wheezing. I had the nursing staff ambulate the patient and her pulse ox dropped to 76% while in the leading. When she set back down on her stretcher her pulse ox went back up to 92%. We discussed admission and patient is agreeable. At this point I started her on Levaquin for presumed bronchitis. Patient states she's never had to be admitted to the hospital for breathing difficulty.  05:08 Dr Myna Hidalgo admit to [facility] Review Results for orders placed or performed during the hospital encounter of 08/08/15  CBC with Differential  Result Value Ref Range    WBC 8.4 4.0 - 10.5 K/uL   RBC 4.90 3.87 - 5.11 MIL/uL   Hemoglobin 14.8 12.0 - 15.0 g/dL   HCT 46.0 36.0 - 46.0 %   MCV 93.9 78.0 - 100.0 fL   MCH 30.2 26.0 - 34.0 pg   MCHC 32.2 30.0 - 36.0 g/dL   RDW 13.4 11.5 - 15.5 %   Platelets 193 150 - 400 K/uL   Neutrophils Relative % 65 %   Neutro Abs 5.5 1.7 - 7.7 K/uL   Lymphocytes Relative 25 %   Lymphs Abs 2.1 0.7 - 4.0 K/uL   Monocytes Relative 7 %   Monocytes Absolute 0.6 0.1 - 1.0 K/uL   Eosinophils Relative 2 %   Eosinophils Absolute 0.2 0.0 - 0.7 K/uL   Basophils Relative 1 %   Basophils Absolute 0.0 0.0 - 0.1 K/uL  Basic metabolic panel  Result Value Ref Range   Sodium 142 135 - 145 mmol/L   Potassium 4.3 3.5 - 5.1 mmol/L   Chloride 109 101 - 111 mmol/L   CO2 28 22 - 32 mmol/L   Glucose, Bld 93 65 - 99 mg/dL   BUN 13 6 - 20 mg/dL   Creatinine, Ser 0.74 0.44 - 1.00 mg/dL   Calcium 9.1 8.9 - 10.3 mg/dL   GFR calc non Af Amer >60 >60 mL/min   GFR calc Af Amer >60 >60 mL/min   Anion gap 5 5 - 15    Laboratory interpretation all normal   Imaging Review Dg Chest 2 View  08/08/2015  CLINICAL DATA:  Non productive cough x 2 weeks and upper abdominal pain starting last night. History of AAA, HTN, lung and brain cancer, COPD, Stroke, port a cath. EXAM: CHEST  2 VIEW COMPARISON:  Chest CT, 06/20/2015.  Chest radiograph: 04/14/2015. FINDINGS: Cardiopericardial silhouette is mildly enlarged. Left perihilar and right infrahilar opacities are stable consistent with post treatment related scarring. No acute findings in the lungs. No pleural effusion or pneumothorax. Right anterior chest wall Port-A-Cath is stable with its tip in the mid to lower superior vena cava. Chronic compression fractures of the upper lumbar spine. Bony thorax is demineralized. IMPRESSION: 1. No acute cardiopulmonary disease. 2. Left perihilar and right infrahilar opacity noted on the prior chest radiograph and more recent prior chest CT is without significant change  consistent with post treatment related scarring. No convincing pneumonia. No pulmonary edema. Persistent pericardial effusions suggested by enlargement of the cardiopericardial silhouette. Electronically Signed  By: Lajean Manes M.D.   On: 08/08/2015 20:48   I have personally reviewed and evaluated these images and lab results as part of my medical decision-making.   EKG Interpretation None      MDM   Final diagnoses:  COPD exacerbation ([place])  Hypoxia   Plan admission  Rolland Porter, MD, FACEP   CRITICAL CARE Performed by: Rolland Porter L Total critical care time: 33  minutes Critical care time was exclusive of separately billable procedures and treating other patients. Critical care was necessary to treat or prevent imminent or life-threatening deterioration. Critical care was time spent personally by me on the following activities: development of treatment plan with patient and/or surrogate as well as nursing, discussions with consultants, evaluation of patient's response to treatment, examination of patient, obtaining history from patient or surrogate, ordering and performing treatments and interventions, ordering and review of laboratory studies, ordering and review of radiographic studies, pulse oximetry and re-evaluation of patient's condition.     I personally performed the services described in this documentation, which was scribed in my presence. The recorded information has been reviewed and considered.  Rolland Porter, MD, Barbette Or, MD 08/09/15 [phone]

## 2015-08-09 NOTE — ED Notes (Signed)
I walked pt around nurses station and her o2 dropped to 85% and before we got back to room her o2 was 76%. Pt was asymptomatic and EDP notified. Sitting up resting in bed her o2 went back up to 92%.

## 2015-08-09 NOTE — H&P (Signed)
[facility] History and Physical  Courtney West VEH:[id number] DOB: 1951-02-18 DOA: 08/08/2015  Referring physician: ED physician PCP: No PCP Per Patient  Specialists:  Dr. Julien Nordmann (med onc), Dr. Tammi Klippel (rad onc)   Chief Complaint:  Dyspnea at rest  HPI: Courtney West is a 65 y.o. 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 Patient is undergone chemotherapy and radiation for her cancer and had been in her usual state until approximately 3 days ago when she noted increasing dyspnea with minimal exertion. This was proceeded by chills and sweats, which continued until 2 days ago. There is been an associated cough which is nonproductive. Patient denies chest pain or palpitations. There has been no orthopnea or peripheral edema but the patient has woken due to dyspnea the last 2 nights. Patient has history of VT E and had been prescribed Xarelto but was unable to obtain this due to poor insurance coverage and personal financial constraints. Patient has had similar symptoms previously. She has been using her albuterol with increasing frequency, but without appreciable improvement.  In ED, patient was found to be afebrile, saturating in the low 90s while at rest, and with vital signs stable. Chest x-ray was obtained and negative for acute cardiopulmonary process. Chem panel and CBC were obtained, both entirely within the normal limits. Patient was reportedly quite diminished on auscultation of her lungs and a 10 mg continuous neb treatment was administered. There was some initial improvement following the nebulizer, but symptoms really worsened shortly after that. A second continuous neb treatment with 10 mg albuterol was given, and again the patient enjoyed some subjective improvement. She was ambulated about  the ED in preparation for potential discharge home, but desaturated to 75% after ambulating just a short distance. He dose of IV Levaquin was administered, as well as a 125 mg IV push of Solu-Medrol. Patient remained hemodynamically stable in the emergency department and will be admitted for ongoing evaluation and management of COPD with acute exacerbation.  Where does patient live?   At home   Can patient participate in ADLs?  Yes       Review of Systems:   General: no fevers, weight change, poor appetite, or fatigue. Chills, sweats HEENT: no blurry vision, hearing changes or sore throat Pulm: Dyspneic at rest, non-productive cough, wheezes CV: no chest pain or palpitations Abd: no nausea, vomiting, abdominal pain, diarrhea, or constipation GU: no dysuria, hematuria, increased urinary frequency, or urgency  Ext: no leg edema Neuro: no focal weakness, numbness, or tingling, no vision change or hearing loss Skin: no rash, no wounds MSK: No muscle spasm, no deformity, no red, hot, or swollen joint Heme: No easy bruising or bleeding Travel history: No recent long distant travel    Allergy:  Allergies  Allergen Reactions  . Sulfa Antibiotics Nausea Only  . Carboplatin Itching  . Codeine Nausea And Vomiting  . Dilaudid [Hydromorphone Hcl] Nausea Only  . Penicillins Itching and Rash    Has patient had a PCN reaction causing immediate rash, facial/tongue/throat swelling, SOB or lightheadedness with hypotension: No Has patient had a PCN reaction causing severe rash involving mucus membranes or skin necrosis: No Has patient had a PCN reaction that required hospitalization No Has patient had a PCN reaction occurring within the last 10 years: No  If all of the above answers are "NO", then may proceed with Cephalosporin use.     Past Medical History  Diagnosis Date  . Hypertension   . COPD (chronic obstructive pulmonary disease) ([place])   . Stroke ([place])   . Abdominal aortic aneurysm ([place])    . Dyslipidemia   . S/P radiation therapy 7/12 thru 9/12, 04/18/11    xrt to brain mets  . Lung cancer ([place]) 12/2010  . Brain cancer ([place])     mets from lung primary  . Allergy     codeine,pcn,sulfa drugs  . Radiation 10/20/2011    frontal mets/Palliation  . History of radiation therapy 01/19/12, 01/21/12, 01/26/12    RLL lung  . History of radiation therapy 04/18/11    single fraction to 6 brain mets  . Clotted vascular catheter ([place]) 03/30/2012  . Lung cancer ([place]) 12/22/2010    Past Surgical History  Procedure Laterality Date  . Carpal tunnel release    . Left knee surgery    . Cholecystectomy    . Partial hysterectomy      Social History:  reports that she has been smoking Cigarettes.  She has a 50 pack-year smoking history. She does not have any smokeless tobacco history on file. She reports that she does not drink alcohol or use illicit drugs.  Family History:  Family History  Problem Relation Age of Onset  . Cancer Father     lung, leukemia  . Cancer Maternal Aunt     kidney     Prior to Admission medications   Medication Sig Start Date End Date Taking? Authorizing Provider  albuterol (PROVENTIL HFA;VENTOLIN HFA) 108 (90 BASE) MCG/ACT inhaler Inhale 2 puffs into the lungs every 4 (four) hours as needed for wheezing or shortness of breath. 12/20/14   Tyler Pita, MD  gabapentin (NEURONTIN) 300 MG capsule Take 2 capsules (600 mg total) by mouth 2 (two) times daily. 03/28/15   Oswald Hillock, MD  HYDROcodone-acetaminophen (NORCO/VICODIN) 5-325 MG tablet Take 1 tablet by mouth every 4 (four) hours as needed. Patient not taking: Reported on 04/26/2015 04/15/15   Kristen N Ward, DO  ibuprofen (ADVIL,MOTRIN) 200 MG tablet Take 800 mg by mouth every 6 (six) hours as needed.    Historical Provider, MD  ipratropium-albuterol (DUONEB) 0.5-2.5 (3) MG/3ML SOLN Take 3 mLs by nebulization every 6 (six) hours as needed. 03/28/15   Oswald Hillock, MD  methocarbamol (ROBAXIN) 500 MG tablet Take  1 tablet (500 mg total) by mouth 2 (two) times daily. Patient not taking: Reported on 07/12/2015 04/26/15   Maryanna Shape, NP  rivaroxaban (XARELTO) 20 MG TABS tablet Take 1 tablet (20 mg total) by mouth daily with supper. Patient not taking: Reported on 06/20/2015 04/17/15   Oswald Hillock, MD  SYMBICORT 80-4.5 MCG/ACT inhaler INHALE TWO PUFFS BY MOUTH AS NEEDED 07/31/15   Hayden Pedro, PA-C    Physical Exam: Filed Vitals:   08/09/15 0030 08/09/15 0124 08/09/15 0257 08/09/15 0500  BP:  150/82  123/78  Pulse:  83  90  Temp:    98 F (36.7 C)  Resp:  20  22  Height:      Weight:      SpO2: 95% 100% 95% 92%   General: Not in acute distress at this time HEENT:       Eyes: PERRL, EOMI, no scleral icterus or conjunctival pallor.       ENT: No discharge from the ears or nose,  no pharyngeal ulcers, petechiae or exudate, no tonsillar enlargement.        Neck: No JVD, no bruit, no appreciable mass Heme: No cervical adenopathy, no pallor Cardiac: S1/S2, RRR, No murmurs, No gallops or rubs. Pulm: Breath sounds diminished b/l with scattered end-expiratory wheezes, no crackles  Abd: Soft, nondistended, nontender, no rebound pain or gaurding, no mass or organomegaly, BS present. Ext: No LE edema bilaterally. 2+DP/PT pulse bilaterally. Musculoskeletal: No gross deformity, no red, hot, swollen joints   Skin: No rashes or wounds on exposed surfaces  Neuro: Alert, oriented X3, cranial nerves II-XII grossly intact. No focal findings Psych: Patient is not overtly psychotic, appropriate mood and affect.  Labs on Admission:  Basic Metabolic Panel:  Recent Labs Lab 08/08/15 2054  NA 142  K 4.3  CL 109  CO2 28  GLUCOSE 93  BUN 13  CREATININE 0.74  CALCIUM 9.1   Liver Function Tests: No results for input(s): AST, ALT, ALKPHOS, BILITOT, PROT, ALBUMIN in the last 168 hours. No results for input(s): LIPASE, AMYLASE in the last 168 hours. No results for input(s): AMMONIA in the last 168  hours. CBC:  Recent Labs Lab 08/08/15 2054  WBC 8.4  NEUTROABS 5.5  HGB 14.8  HCT 46.0  MCV 93.9  PLT 193   Cardiac Enzymes: No results for input(s): CKTOTAL, CKMB, CKMBINDEX, TROPONINI in the last 168 hours.  BNP (last 3 results)  Recent Labs  04/14/15 2156  BNP 202.0*    ProBNP (last 3 results) No results for input(s): PROBNP in the last 8760 hours.  CBG: No results for input(s): GLUCAP in the last 168 hours.  Radiological Exams on Admission: Dg Chest 2 View  08/08/2015  CLINICAL DATA:  Non productive cough x 2 weeks and upper abdominal pain starting last night. History of AAA, HTN, lung and brain cancer, COPD, Stroke, port a cath. EXAM: CHEST  2 VIEW COMPARISON:  Chest CT, 06/20/2015.  Chest radiograph: 04/14/2015. FINDINGS: Cardiopericardial silhouette is mildly enlarged. Left perihilar and right infrahilar opacities are stable consistent with post treatment related scarring. No acute findings in the lungs. No pleural effusion or pneumothorax. Right anterior chest wall Port-A-Cath is stable with its tip in the mid to lower superior vena cava. Chronic compression fractures of the upper lumbar spine. Bony thorax is demineralized. IMPRESSION: 1. No acute cardiopulmonary disease. 2. Left perihilar and right infrahilar opacity noted on the prior chest radiograph and more recent prior chest CT is without significant change consistent with post treatment related scarring. No convincing pneumonia. No pulmonary edema. Persistent pericardial effusions suggested by enlargement of the cardiopericardial silhouette. Electronically Signed   By: Lajean Manes M.D.   On: 08/08/2015 20:48    EKG:  Not done in ED, will obtain as appropriate   Assessment/Plan  1. COPD with acute exacerbation and acute hypoxic respiratory failure   - Suspect an infectious precipitant given history of subjective fevers, chills  - Desaturated to mid 70s while ambulating in ED following 10 mg continuous neb x2   - Received an empiric dose of Levaquin in ED, will continue q24h  - Solu-Medrol 125 mg IVP given in ED, will continue systemic steroid with Solu-Medrol 60 mg IV q6h - DuoNebs scheduled q6h, also available q2h prn  - Continuous pulse oximetry with titration of FiO2 to maintain sat >92%   2. Bronchogenic cancer, right lung, metastatic to brain  - Status post chemotherapy and radiation - Followed by Dr. Julien Nordmann of medical oncology  - Management  per oncology   3. Ongoing tobacco abuse  - Endorses 1 ppd habit  - Counseled pt towards cessation  - RN to provide cessation information prior to discharge   - Nicotine patch prn   4. Hypertension  - At goal so far  - Monitor without antihypertensives  - Initiate treatment prn    DVT ppx:  SQ Lovenox   Code Status: Full code Family Communication:  Yes, patient's daughter at bed side Disposition Plan: Admit to inpatient   Date of Service 08/09/2015    Vianne Bulls, MD [facility] Pager [phone]  If 7PM-7AM, please contact night-coverage [email or web address] Password [facility] 08/09/2015, 6:05 AM

## 2015-08-09 NOTE — Progress Notes (Addendum)
Patient was seen and chart reviewed.' 65 yo female with hx of bronchogenic right lung carcinoma with brain mets, Tx by Dr Earlie Server, admitted for COPD exacerbation by Dr Myna Hidalgo earlier today.  She is doing better with neb Tx, IV Steroids, and antibiotics.  She is coughing, and I will give her some codeine cough syrup (reaction:  Itching).  CXR showed no acute cardiopulmonary process, pericardial effusion and lung CA unchanged.  Will continue with her current Tx.   She is stable.   Orvan Falconer, MD  FACP. Hospitalist.

## 2015-08-10 DIAGNOSIS — J9601 Acute respiratory failure with hypoxia: Secondary | ICD-10-CM

## 2015-08-10 DIAGNOSIS — J441 Chronic obstructive pulmonary disease with (acute) exacerbation: Principal | ICD-10-CM

## 2015-08-10 DIAGNOSIS — C3491 Malignant neoplasm of unspecified part of right bronchus or lung: Secondary | ICD-10-CM

## 2015-08-10 LAB — GLUCOSE, CAPILLARY: GLUCOSE-CAPILLARY: 134 mg/dL — AB (ref 65–99)

## 2015-08-10 MED ORDER — LEVOFLOXACIN 750 MG PO TABS
750.0000 mg | ORAL_TABLET | ORAL | Status: DC
  Administered 2015-08-11: 750 mg via ORAL
  Filled 2015-08-10: qty 1

## 2015-08-10 MED ORDER — GUAIFENESIN-CODEINE 100-10 MG/5ML PO SOLN
10.0000 mL | ORAL | Status: DC | PRN
  Administered 2015-08-10: 10 mL via ORAL
  Filled 2015-08-10: qty 10

## 2015-08-10 NOTE — Care Management Important Message (Signed)
Important Message  Patient Details  Name: Courtney West MRN: [id number] Date of Birth: 06/15/1951   Medicare Important Message Given:  Yes    Alvie Heidelberg, RN 08/10/2015, 4:36 PM

## 2015-08-10 NOTE — Care Management Note (Signed)
Case Management Note  Patient Details  Name: Courtney West MRN: [id number] Date of Birth: 1951/01/17  Subjective/Objective:       Spoke with patient for discharge planning. Patient is alert and oriented from home with sister living in same home. Daughter lives next door. And son lives nearby.  Patient stated that she has home O2 with [place] does not use and other DME. Has nebulizer machine.  NoCM needs identified              Action/Plan:  Home with self care.  Expected Discharge Date:  08/12/15               Expected Discharge Plan:  Home/Self Care  In-House Referral:     Discharge planning Services     Post Acute Care Choice:    Choice offered to:     DME Arranged:    DME Agency:     HH Arranged:    HH Agency:     Status of Service:  Completed, signed off  Medicare Important Message Given:    Date Medicare IM Given:    Medicare IM give by:    Date Additional Medicare IM Given:    Additional Medicare Important Message give by:     If discussed at [place] of Stay Meetings, dates discussed:    Additional Comments:  Alvie Heidelberg, RN 08/10/2015, 12:56 PM

## 2015-08-10 NOTE — Progress Notes (Signed)
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 She is doing better with neb Tx, IV Steroids, and antibiotics. She is coughing, and I will give her some codeine cough syrup (reaction: Itching). CXR showed no acute cardiopulmonary process, pericardial effusion and lung CA unchanged. She is still having significant wheezing.  No other complaints.   Rewiew of Systems:  Constitutional: Negative for malaise, fever and chills. No significant weight loss or weight gain Eyes: Negative for eye pain, redness and discharge, diplopia, visual changes, or flashes of light. ENMT: Negative for ear pain, hoarseness, nasal congestion, sinus pressure and sore throat. No headaches; tinnitus, drooling, or problem swallowing. Cardiovascular: Negative for chest pain, palpitations, diaphoresis, and peripheral edema. ; No orthopnea, PND Respiratory: Negative for cough, hemoptysis, wheezing and stridor. No pleuritic chestpain. Gastrointestinal: Negative for nausea, vomiting, diarrhea, constipation, abdominal pain, melena, blood in stool, hematemesis, jaundice and rectal bleeding.    Genitourinary: Negative for frequency, dysuria, incontinence,flank pain and hematuria; Musculoskeletal: Negative for back pain and neck pain. Negative for swelling and trauma.;  Skin: . Negative for pruritus, rash, abrasions, bruising and skin lesion.; ulcerations Neuro: Negative for headache, lightheadedness and neck stiffness. Negative for weakness, altered level of consciousness , altered mental status, extremity weakness, burning feet, involuntary movement, seizure and syncope.  Psych: negative for anxiety, depression, insomnia, tearfulness, panic attacks, hallucinations, paranoia, suicidal or homicidal ideation     Past Medical History  Diagnosis Date  . Hypertension   . COPD (chronic obstructive pulmonary disease) ([place])   . Stroke ([place])   . Abdominal aortic aneurysm ([place])   . Dyslipidemia   . S/P radiation therapy 7/12 thru 9/12, 04/18/11    xrt to brain mets  . Lung cancer ([place]) 12/2010  . Brain cancer ([place])     mets from lung primary  . Allergy     codeine,pcn,sulfa drugs  . Radiation 10/20/2011    frontal mets/Palliation  . History of radiation therapy 01/19/12, 01/21/12, 01/26/12    RLL lung  . History of radiation therapy 04/18/11    single fraction to 6 brain mets  . Clotted vascular catheter ([place]) 03/30/2012  . Lung cancer ([place]) 12/22/2010    Past Surgical History  Procedure Laterality Date  . Carpal tunnel release    . Left knee surgery    . Cholecystectomy    . Partial hysterectomy      Medications:  HOME MEDS: Prior to Admission medications   Medication Sig Start Date End Date Taking? Authorizing Provider  albuterol (PROVENTIL HFA;VENTOLIN HFA) 108 (90 BASE) MCG/ACT inhaler Inhale 2 puffs into the lungs every 4 (four) hours as needed for wheezing or shortness of breath. 12/20/14  Yes Tyler Pita, MD  gabapentin (NEURONTIN) 300 MG capsule Take 2 capsules (600 mg total) by mouth 2 (two) times daily. Patient taking differently: Take 600 mg by mouth 3 (three) times daily.  03/28/15  Yes Oswald Hillock, MD  ibuprofen (ADVIL,MOTRIN) 200 MG tablet Take 800 mg by mouth every 6 (six) hours as needed for moderate pain.    Yes Historical Provider, MD  ipratropium-albuterol (DUONEB) 0.5-2.5 (3) MG/3ML SOLN Take 3 mLs by nebulization every 6 (six) hours as needed. 03/28/15  Yes Oswald Hillock, MD  SYMBICORT 80-4.5 MCG/ACT inhaler INHALE  TWO PUFFS BY MOUTH AS NEEDED 07/31/15  Yes Hayden Pedro, PA-C  rivaroxaban (XARELTO) 20 MG TABS tablet Take 1 tablet (20 mg total) by mouth daily with supper. Patient not taking: Reported on 06/20/2015 04/17/15   Oswald Hillock, MD     Allergies:   Allergies  Allergen Reactions  . Sulfa Antibiotics Nausea Only  . Carboplatin Itching  . Codeine Nausea And Vomiting  . Dilaudid [Hydromorphone Hcl] Nausea Only  . Penicillins Itching and Rash    Has patient had a PCN reaction causing immediate rash, facial/tongue/throat swelling, SOB or lightheadedness with hypotension: No Has patient had a PCN reaction causing severe rash involving mucus membranes or skin necrosis: No Has patient had a PCN reaction that required hospitalization No Has patient had a PCN reaction occurring within the last 10 years: No If all of the above answers are "NO", then may proceed with Cephalosporin use.     Social History:   reports that she has been smoking Cigarettes.  She has a 50 pack-year smoking history. She does not have any smokeless tobacco history on file. She reports that she does not drink alcohol or use illicit drugs.  Family History: Family History  Problem Relation Age of Onset  . Cancer Father     lung, leukemia  . Cancer Maternal Aunt     kidney     Physical Exam: Filed Vitals:   08/10/15 0500 08/10/15 0752 08/10/15 1300 08/10/15 1415  BP: 110/53  115/65   Pulse:   91   Temp: 97.8 F (36.6 C)  98 F (36.7 C)   TempSrc: Oral  Oral   Resp: 18  18   Height:      Weight:      SpO2: 94% 93% 93% 94%   Blood pressure 115/65, pulse 91, temperature 98 F (36.7 C), temperature source Oral, resp. rate 18, height '5\' 3"'$  (1.6 m), weight 79.153 kg (174 lb 8 oz), SpO2 94 %.  GEN:  Pleasant  patient lying in the stretcher in no acute distress; cooperative with exam. PSYCH:  alert and oriented x4; does not appear anxious or depressed; affect is appropriate. HEENT: Mucous membranes pink and anicteric; PERRLA; EOM intact; no cervical lymphadenopathy nor thyromegaly or carotid bruit; no JVD; There were no stridor. Neck is very supple. Breasts:: Not examined CHEST WALL: No tenderness CHEST: Normal respiration, still has diffuse insp and exp  wheezings.  HEART: Regular rate and rhythm.  There are no murmur, rub, or gallops.   BACK: No kyphosis or scoliosis; no CVA tenderness ABDOMEN: soft and non-tender; no masses, no organomegaly, normal abdominal bowel sounds; no pannus; no intertriginous candida. There is no rebound and no distention. Rectal Exam: Not done EXTREMITIES: No bone or joint deformity; age-appropriate arthropathy of the hands and knees; no edema; no ulcerations.  There is no calf tenderness. Genitalia: not examined PULSES: 2+ and symmetric SKIN: Normal hydration no rash or ulceration CNS: Cranial nerves 2-12 grossly intact no focal lateralizing neurologic deficit.  Speech is fluent; uvula elevated with phonation, facial symmetry and tongue midline. DTR are normal bilaterally, cerebella exam is intact, barbinski is negative and strengths are equaled bilaterally.  No sensory loss.   Labs on Admission:  Basic Metabolic Panel:  Recent Labs Lab 08/08/15 2054  NA 142  K 4.3  CL 109  CO2 28  GLUCOSE 93  BUN 13  CREATININE 0.74  CALCIUM 9.1   CBC:  Recent Labs Lab 08/08/15 2054  WBC 8.4  NEUTROABS 5.5  HGB 14.8  HCT 46.0  MCV 93.9  PLT 193   CBG:  Recent Labs Lab 08/09/15 0753 08/10/15 0728  GLUCAP 270* 134*     Radiological Exams on Admission: Dg Chest 2 View  08/08/2015  CLINICAL DATA:  Non productive cough x 2 weeks and upper abdominal pain starting last night. History of AAA, HTN, lung and brain cancer, COPD, Stroke, port a cath. EXAM: CHEST  2 VIEW COMPARISON:  Chest CT, 06/20/2015.  Chest radiograph: 04/14/2015. FINDINGS: Cardiopericardial silhouette is mildly enlarged. Left perihilar and right infrahilar opacities are stable consistent with post treatment related scarring. No acute findings in the lungs. No pleural effusion or pneumothorax. Right anterior chest wall Port-A-Cath is stable with its tip in the mid to lower superior vena cava. Chronic compression fractures of the upper lumbar  spine. Bony thorax is demineralized. IMPRESSION: 1. No acute cardiopulmonary disease. 2. Left perihilar and right infrahilar opacity noted on the prior chest radiograph and more recent prior chest CT is without significant change consistent with post treatment related scarring. No convincing pneumonia. No pulmonary edema. Persistent pericardial effusions suggested by enlargement of the cardiopericardial silhouette. Electronically Signed   By: Lajean Manes M.D.   On: 08/08/2015 20:48   Assessment/Plan Present on Admission:  . COPD exacerbation ([place]) . Hypertension . Bronchogenic cancer of right lung ([place]) . Brain Metastases . COPD with exacerbation ([place])  PLAN:  1. COPD with acute exacerbation and acute hypoxic respiratory failure    Will continue with IV Steroids, neb and antibiotics.   2. Bronchogenic cancer, right lung, metastatic to brain  - Status post chemotherapy and radiation - Followed by Dr. Julien Nordmann of medical oncology  - Management per oncology   3. Ongoing tobacco abuse  - Endorses 1 ppd habit  - Counseled pt towards cessation  - RN to provide cessation information prior to discharge  - Nicotine patch prn   4. Hypertension  - At goal so far  - Monitor without antihypertensives  - Initiate treatment prn     Other plans as per orders. Code Status: FULL CODE>    Orvan Falconer, MD.  FACP [facility] Pager [phone] 7pm to 7am.  08/10/2015, 4:49 PM

## 2015-08-11 LAB — GLUCOSE, CAPILLARY: GLUCOSE-CAPILLARY: 123 mg/dL — AB (ref 65–99)

## 2015-08-11 LAB — PROCALCITONIN

## 2015-08-11 MED ORDER — GUAIFENESIN-CODEINE 100-10 MG/5ML PO SOLN: 10.0000 mL | ORAL | Status: DC | PRN

## 2015-08-11 MED ORDER — PREDNISONE 20 MG PO TABS: 20.0000 mg | ORAL_TABLET | Freq: Two times a day (BID) | ORAL | Status: DC

## 2015-08-11 MED ORDER — LEVOFLOXACIN 750 MG PO TABS: 750.0000 mg | ORAL_TABLET | ORAL | Status: DC

## 2015-08-11 NOTE — Progress Notes (Signed)
Pt IV removed per pt, site clean/dry.  Reviewed discharge instructions with pt and answered questions at this time.

## 2015-08-11 NOTE — Discharge Summary (Signed)
Physician Discharge Summary  KEBRA LOWRIMORE [id number]:[id number] DOB: 10-Feb-1951 DOA: 08/08/2015  PCP: No PCP Per Patient  Admit date: 08/08/2015 Discharge date: 08/11/2015  Time spent:35 minutes  Recommendations for Outpatient Follow-up:  1. Follow up with PCP in one week    Discharge Diagnoses:  Principal Problem:   COPD exacerbation ([place]) Active Problems:   Bronchogenic cancer of right lung ([place])   Brain Metastases   Hypertension   COPD with exacerbation ([place])   Acute respiratory failure with hypoxia ([place])   Discharge Condition:  Improved.  Baseline SOB.  Minimal coughs, no wheezing.   Diet recommendation: Heart heathy diet.   Filed Weights   08/08/15 2023 08/09/15 0624  Weight: 79.833 kg (176 lb) 79.153 kg (174 lb 8 oz)    History of present illness: patient was admitted by Dr Myna Hidalgo on Aug 09, 2015 for COPD exacerbation.  As per his H and P:  " Piper P Nemitz is a 65 y.o. 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 Patient is undergone chemotherapy and radiation for her cancer and had been in her usual state until approximately 3 days ago when she noted increasing dyspnea with minimal exertion. This was proceeded by chills and sweats, which continued until 2 days ago. There is been an associated cough which is nonproductive. Patient denies chest pain or palpitations. There has been no orthopnea or peripheral edema but the patient has woken due to dyspnea the last 2 nights. Patient has history of VT E and had been prescribed Xarelto but was unable to obtain this due to poor insurance coverage and personal financial constraints. Patient has had similar symptoms previously. She has been using her albuterol with increasing frequency, but without appreciable improvement.  In ED, patient was found to be  afebrile, saturating in the low 90s while at rest, and with vital signs stable. Chest x-ray was obtained and negative for acute cardiopulmonary process. Chem panel and CBC were obtained, both entirely within the normal limits. Patient was reportedly quite diminished on auscultation of her lungs and a 10 mg continuous neb treatment was administered. There was some initial improvement following the nebulizer, but symptoms really worsened shortly after that. A second continuous neb treatment with 10 mg albuterol was given, and again the patient enjoyed some subjective improvement. She was ambulated about the ED in preparation for potential discharge home, but desaturated to 75% after ambulating just a short distance. He dose of IV Levaquin was administered, as well as a 125 mg IV push of Solu-Medrol. Patient remained hemodynamically stable in the emergency department and will be admitted for ongoing evaluation and management of COPD with acute exacerbation.   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  She is doing better with neb Tx, IV Steroids, and antibiotics. She is coughing, and I will give her some codeine cough syrup (reaction: Itching). She tolerated coughing meds well.  Her CXR showed no acute cardiopulmonary process, but with pericardial effusion and lung CA unchanged. She is still having significant wheezing, so her steroid was continued.  She improved after several days of treatment and has been anxious to go home to her dogs.  She will be discharged today  finishing her antibiotics.  She will be given Tussionex for her coughs, along with Prednisone at '20mg'$  BID with food for 5 days.  She will follow up with her PCP and her oncologist next week.     Discharge Exam: Filed Vitals:   08/10/15 2142 08/11/15 0622  BP: 111/58 136/72  Pulse: 88 74  Temp: 98 F (36.7 C) 97.2 F (36.2 C)   Resp: 20 20    Discharge Instructions   Discharge Instructions    Diet - low sodium heart healthy    Complete by:  As directed      Increase activity slowly    Complete by:  As directed           Current Discharge Medication List    START taking these medications   Details  guaiFENesin-codeine 100-10 MG/5ML syrup Take 10 mLs by mouth every 4 (four) hours as needed for cough. Qty: 120 mL, Refills: 0    levofloxacin (LEVAQUIN) 750 MG tablet Take 1 tablet (750 mg total) by mouth daily. Qty: 5 tablet, Refills: 0    predniSONE (DELTASONE) 20 MG tablet Take 1 tablet (20 mg total) by mouth 2 (two) times daily with a meal. Qty: 10 tablet, Refills: 0      CONTINUE these medications which have NOT CHANGED   Details  albuterol (PROVENTIL HFA;VENTOLIN HFA) 108 (90 BASE) MCG/ACT inhaler Inhale 2 puffs into the lungs every 4 (four) hours as needed for wheezing or shortness of breath. Qty: 6.7 g, Refills: 0   Associated Diagnoses: Bronchogenic cancer of right lung ([facility])    gabapentin (NEURONTIN) 300 MG capsule Take 2 capsules (600 mg total) by mouth 2 (two) times daily. Qty: 30 capsule, Refills: 2    ipratropium-albuterol (DUONEB) 0.5-2.5 (3) MG/3ML SOLN Take 3 mLs by nebulization every 6 (six) hours as needed. Qty: 360 mL, Refills: 2    SYMBICORT 80-4.5 MCG/ACT inhaler INHALE TWO PUFFS BY MOUTH AS NEEDED Qty: 1 Inhaler, Refills: 0    rivaroxaban (XARELTO) 20 MG TABS tablet Take 1 tablet (20 mg total) by mouth daily with supper. Qty: 30 tablet, Refills: 2      STOP taking these medications     ibuprofen (ADVIL,MOTRIN) 200 MG tablet        Allergies  Allergen Reactions  . Sulfa Antibiotics Nausea Only  . Carboplatin Itching  . Codeine Nausea And Vomiting  . Dilaudid [Hydromorphone Hcl] Nausea Only  . Penicillins Itching and Rash    Has patient had a PCN reaction causing immediate rash, facial/tongue/throat swelling, SOB or lightheadedness with hypotension: No Has  patient had a PCN reaction causing severe rash involving mucus membranes or skin necrosis: No Has patient had a PCN reaction that required hospitalization No Has patient had a PCN reaction occurring within the last 10 years: No If all of the above answers are "NO", then may proceed with Cephalosporin use.       The results of significant diagnostics from this hospitalization (including imaging, microbiology, ancillary and laboratory) are listed below for reference.    Significant Diagnostic Studies: Dg Chest 2 View  08/08/2015  CLINICAL DATA:  Non productive cough x 2 weeks and upper abdominal pain starting last night. History of AAA, HTN, lung and brain cancer, COPD, Stroke, port a cath. EXAM: CHEST  2 VIEW COMPARISON:  Chest CT, 06/20/2015.  Chest radiograph: 04/14/2015. FINDINGS: Cardiopericardial silhouette is mildly enlarged. Left perihilar and right infrahilar opacities are stable consistent with post treatment related scarring. No acute findings  in the lungs. No pleural effusion or pneumothorax. Right anterior chest wall Port-A-Cath is stable with its tip in the mid to lower superior vena cava. Chronic compression fractures of the upper lumbar spine. Bony thorax is demineralized. IMPRESSION: 1. No acute cardiopulmonary disease. 2. Left perihilar and right infrahilar opacity noted on the prior chest radiograph and more recent prior chest CT is without significant change consistent with post treatment related scarring. No convincing pneumonia. No pulmonary edema. Persistent pericardial effusions suggested by enlargement of the cardiopericardial silhouette. Electronically Signed   By: Lajean Manes M.D.   On: 08/08/2015 20:48    Microbiology: No results found for this or any previous visit (from the past 240 hour(s)).   Labs: Basic Metabolic Panel:  Recent Labs Lab 08/08/15 2054  NA 142  K 4.3  CL 109  CO2 28  GLUCOSE 93  BUN 13  CREATININE 0.74  CALCIUM 9.1   Liver Function  Tests: No results for input(s): AST, ALT, ALKPHOS, BILITOT, PROT, ALBUMIN in the last 168 hours. No results for input(s): LIPASE, AMYLASE in the last 168 hours. No results for input(s): AMMONIA in the last 168 hours. CBC:  Recent Labs Lab 08/08/15 2054  WBC 8.4  NEUTROABS 5.5  HGB 14.8  HCT 46.0  MCV 93.9  PLT 193    Signed:  Rishon Thilges MD.  [facility] 08/11/2015, 10:40 AM

## 2015-10-21 ENCOUNTER — Other Ambulatory Visit: Payer: Self-pay | Admitting: Radiation Oncology

## 2015-11-23 ENCOUNTER — Telehealth: Payer: Self-pay | Admitting: Radiation Oncology

## 2015-11-23 DIAGNOSIS — G609 Hereditary and idiopathic neuropathy, unspecified: Secondary | ICD-10-CM

## 2015-11-23 MED ORDER — GABAPENTIN 300 MG PO CAPS: 600.0000 mg | ORAL_CAPSULE | Freq: Two times a day (BID) | ORAL | Status: DC

## 2015-11-23 NOTE — Telephone Encounter (Signed)
-----   Message from Hayden Pedro, PA-C sent at 11/23/2015  3:23 PM EDT ----- Regarding: RE: Medication refill request Contact: [phone] That's fine plus 6 refills, I'd ask her to get this refilled next time by Dr. Darrick Meigs or PCP ----- Message -----    From: Heywood Footman, RN    Sent: 11/23/2015   3:06 PM      To: Hayden Pedro, PA-C Subject: Medication refill request                      Bryson Ha.  Deborah called today requesting a refill of her gabapentin. It was last filled by Dr. Darrick Meigs in October. We have filled it once before for her and so has med onc. She called today requesting we refill this. Refill or refer to [place]/PCP?  [name]

## 2015-11-23 NOTE — Telephone Encounter (Signed)
Per PA Dara Lords order called in gabapentin to patient's [place]. Then, phoned patient to make her aware this was done. No answer. Left message.

## 2015-11-27 ENCOUNTER — Telehealth: Payer: Self-pay | Admitting: *Deleted

## 2015-11-27 ENCOUNTER — Telehealth: Payer: Self-pay | Admitting: Radiation Oncology

## 2015-11-27 ENCOUNTER — Other Ambulatory Visit: Payer: Self-pay | Admitting: Radiation Oncology

## 2015-11-27 DIAGNOSIS — G609 Hereditary and idiopathic neuropathy, unspecified: Secondary | ICD-10-CM

## 2015-11-27 MED ORDER — GABAPENTIN 300 MG PO CAPS: 600.0000 mg | ORAL_CAPSULE | Freq: Three times a day (TID) | ORAL | Status: DC

## 2015-11-27 NOTE — Telephone Encounter (Signed)
Called patient to inform of appt. With Dr. Bryon Lions on 03-27-16- arrival time - 2 pm , ph. - [phone], address - [address], [address], spoke with patient and she is aware of this appt.

## 2015-11-27 NOTE — Telephone Encounter (Signed)
Attempted to schedule appt. With [facility] @ [facility] for PCP visit, and they stated that this patient could not be scheduled with them, I also call Dr. H[facility] and they told me that they are not accepting new patients @ this time.

## 2015-11-27 NOTE — Telephone Encounter (Signed)
Called in medication refill clarification. Gabapentin 300 mg capsule, take 2 capsules (600 mg total) tid, qty 180, and six refills. Left script on voicemail with return number for questions. Spoke with patient last night via phone. Patient understands this RN will call in correct gabapentin script to [facility] in [place] today and enter an order for PCP referral for medical manage. Patient verbalized she prefers to only follow up with Dr. Tammi Klippel and not his Shona Simpson, [place]. Place note of this under demographics in patient's EMR.

## 2015-11-28 ENCOUNTER — Telehealth: Payer: Self-pay | Admitting: *Deleted

## 2015-11-28 NOTE — Telephone Encounter (Signed)
CALLED PATIENT TO GIVE INFO. REGARDING PRIMARY CARE DOC, LVM FOR A RETURN CALL

## 2015-12-12 ENCOUNTER — Ambulatory Visit (HOSPITAL_COMMUNITY)
Admission: RE | Admit: 2015-12-12 | Discharge: 2015-12-12 | Disposition: A | Discharge status: Home / Self Care | Payer: Medicare Other | Source: Ambulatory Visit | Attending: Internal Medicine | Admitting: Internal Medicine

## 2015-12-12 ENCOUNTER — Other Ambulatory Visit (HOSPITAL_BASED_OUTPATIENT_CLINIC_OR_DEPARTMENT_OTHER): Payer: Medicare Other

## 2015-12-12 ENCOUNTER — Encounter (HOSPITAL_COMMUNITY): Payer: Self-pay

## 2015-12-12 DIAGNOSIS — C3491 Malignant neoplasm of unspecified part of right bronchus or lung: Secondary | ICD-10-CM | POA: Insufficient documentation

## 2015-12-12 DIAGNOSIS — I313 Pericardial effusion (noninflammatory): Secondary | ICD-10-CM | POA: Diagnosis not present

## 2015-12-12 DIAGNOSIS — C7931 Secondary malignant neoplasm of brain: Secondary | ICD-10-CM | POA: Insufficient documentation

## 2015-12-12 DIAGNOSIS — J8410 Pulmonary fibrosis, unspecified: Secondary | ICD-10-CM | POA: Insufficient documentation

## 2015-12-12 DIAGNOSIS — C3490 Malignant neoplasm of unspecified part of unspecified bronchus or lung: Secondary | ICD-10-CM | POA: Diagnosis not present

## 2015-12-12 DIAGNOSIS — I714 Abdominal aortic aneurysm, without rupture: Secondary | ICD-10-CM | POA: Diagnosis not present

## 2015-12-12 DIAGNOSIS — C7949 Secondary malignant neoplasm of other parts of nervous system: Secondary | ICD-10-CM | POA: Insufficient documentation

## 2015-12-12 DIAGNOSIS — R590 Localized enlarged lymph nodes: Secondary | ICD-10-CM | POA: Diagnosis not present

## 2015-12-12 LAB — COMPREHENSIVE METABOLIC PANEL
ALT: 11 U/L (ref 0–55)
ANION GAP: 9 meq/L (ref 3–11)
AST: 13 U/L (ref 5–34)
Albumin: 3.8 g/dL (ref 3.5–5.0)
Alkaline Phosphatase: 83 U/L (ref 40–150)
BUN: 13.2 mg/dL (ref 7.0–26.0)
CHLORIDE: 105 meq/L (ref 98–109)
CO2: 27 meq/L (ref 22–29)
CREATININE: 0.9 mg/dL (ref 0.6–1.1)
Calcium: 9.9 mg/dL (ref 8.4–10.4)
EGFR: 69 mL/min/{1.73_m2} — ABNORMAL LOW (ref >90)
GLUCOSE: 99 mg/dL (ref 70–140)
Potassium: 4.6 mEq/L (ref 3.5–5.1)
SODIUM: 141 meq/L (ref 136–145)
Total Bilirubin: 0.44 mg/dL (ref 0.20–1.20)
Total Protein: 7.5 g/dL (ref 6.4–8.3)

## 2015-12-12 LAB — CBC WITH DIFFERENTIAL/PLATELET
BASO%: 1.4 % (ref 0.0–2.0)
Basophils Absolute: 0.1 10*3/uL (ref 0.0–0.1)
EOS%: 1.7 % (ref 0.0–7.0)
Eosinophils Absolute: 0.1 10*3/uL (ref 0.0–0.5)
HCT: 50.5 % — ABNORMAL HIGH (ref 34.8–46.6)
HGB: 16.9 g/dL — ABNORMAL HIGH (ref 11.6–15.9)
LYMPH%: 24.2 % (ref 14.0–49.7)
MCH: 31.1 pg (ref 25.1–34.0)
MCHC: 33.5 g/dL (ref 31.5–36.0)
MCV: 92.7 fL (ref 79.5–101.0)
MONO#: 0.5 10*3/uL (ref 0.1–0.9)
MONO%: 5.8 % (ref 0.0–14.0)
NEUT%: 66.9 % (ref 38.4–76.8)
NEUTROS ABS: 5.7 10*3/uL (ref 1.5–6.5)
PLATELETS: 161 10*3/uL (ref 145–400)
RBC: 5.44 10*6/uL (ref 3.70–5.45)
RDW: 13.8 % (ref 11.2–14.5)
WBC: 8.6 10*3/uL (ref 3.9–10.3)
lymph#: 2.1 10*3/uL (ref 0.9–3.3)

## 2015-12-12 MED ORDER — IOPAMIDOL (ISOVUE-300) INJECTION 61%
100.0000 mL | Freq: Once | INTRAVENOUS | Status: AC | PRN
  Administered 2015-12-12: 100 mL via INTRAVENOUS

## 2015-12-15 ENCOUNTER — Other Ambulatory Visit: Payer: Self-pay | Admitting: Radiation Oncology

## 2015-12-19 ENCOUNTER — Encounter: Payer: Self-pay | Admitting: Internal Medicine

## 2015-12-19 ENCOUNTER — Ambulatory Visit (HOSPITAL_BASED_OUTPATIENT_CLINIC_OR_DEPARTMENT_OTHER): Payer: Medicare Other | Admitting: Internal Medicine

## 2015-12-19 ENCOUNTER — Telehealth: Payer: Self-pay | Admitting: Internal Medicine

## 2015-12-19 ENCOUNTER — Telehealth: Payer: Self-pay | Admitting: Radiation Oncology

## 2015-12-19 VITALS — BP 135/71 | HR 78 | Temp 98.3°F | Resp 18 | Ht 63.0 in | Wt 175.3 lb

## 2015-12-19 DIAGNOSIS — C3491 Malignant neoplasm of unspecified part of right bronchus or lung: Secondary | ICD-10-CM | POA: Diagnosis not present

## 2015-12-19 DIAGNOSIS — C7931 Secondary malignant neoplasm of brain: Secondary | ICD-10-CM | POA: Diagnosis not present

## 2015-12-19 DIAGNOSIS — C7949 Secondary malignant neoplasm of other parts of nervous system: Secondary | ICD-10-CM

## 2015-12-19 NOTE — Telephone Encounter (Signed)
Gave pt cal & avs °

## 2015-12-19 NOTE — Telephone Encounter (Signed)
Faxed prior authorization for symbicort with 4 additional refills to [facility]. Confirmation fax of delivery obtained.

## 2015-12-19 NOTE — Progress Notes (Signed)
[place] [phone]   [phone]  OFFICE PROGRESS NOTE  No PCP Per Patient No address on file  DIAGNOSIS: Metastatic non-small cell lung cancer, favoring adenocarcinoma diagnosed in July of 2012, presented with locally advanced disease in the chest as well as brain metastasis. Veristrat test poor.   PRIOR THERAPY:  1. Status post stereotactic radiotherapy to 2 brain lesions under the care of Dr. Lisbeth Renshaw. 2. Status post a course of concurrent chemoradiation with weekly carboplatin and paclitaxel, last dose of chemotherapy was given 03/05/2011. 3. Systemic chemotherapy with carboplatin for AUC of 5 and Alimta 500 mg/M2. The patient is status post 3 cycles. Last dose was given 06/03/2011  CURRENT THERAPY: Observation.  INTERVAL HISTORY: Courtney West 65 y.o. female returns to the clinic today for routine six-month followup visit. The patient is feeling fine today with no specific complaints. She continues to smoke and unwilling to quit smoking even after frequent smoke cessation consultation from me every visit. She denied having any significant chest pain, shortness of breath, cough or hemoptysis. She has no weight loss or night sweats. The patient had repeat CT scan of the chest, abdomen and pelvis performed recently and she is here for evaluation and discussion of her scan results.  MEDICAL HISTORY: Past Medical History  Diagnosis Date  . Hypertension   . COPD (chronic obstructive pulmonary disease) ([place])   . Stroke ([place])   . Abdominal aortic aneurysm ([place])   . Dyslipidemia   . S/P radiation therapy 7/12 thru 9/12, 04/18/11    xrt to brain mets  . Lung cancer ([place]) 12/2010  . Brain cancer ([place])     mets from lung primary  . Allergy     codeine,pcn,sulfa drugs  . Radiation 10/20/2011    frontal mets/Palliation  . History of radiation therapy 01/19/12, 01/21/12, 01/26/12    RLL lung  . History of radiation therapy 04/18/11    single fraction  to 6 brain mets  . Clotted vascular catheter ([place]) 03/30/2012  . Lung cancer ([place]) 12/22/2010    ALLERGIES:  is allergic to sulfa antibiotics; carboplatin; codeine; dilaudid; and penicillins.  MEDICATIONS:  Current Outpatient Prescriptions  Medication Sig Dispense Refill  . albuterol (PROVENTIL HFA;VENTOLIN HFA) 108 (90 BASE) MCG/ACT inhaler Inhale 2 puffs into the lungs every 4 (four) hours as needed for wheezing or shortness of breath. 6.7 g 0  . gabapentin (NEURONTIN) 300 MG capsule Take 2 capsules (600 mg total) by mouth 3 (three) times daily. 180 capsule 6  . SYMBICORT 80-4.5 MCG/ACT inhaler INHALE TWO PUFFS BY MOUTH AS NEEDED 22 Inhaler 0  . ipratropium-albuterol (DUONEB) 0.5-2.5 (3) MG/3ML SOLN Take 3 mLs by nebulization every 6 (six) hours as needed. (Patient not taking: Reported on 12/19/2015) 360 mL 2   No current facility-administered medications for this visit.    SURGICAL HISTORY:  Past Surgical History  Procedure Laterality Date  . Carpal tunnel release    . Left knee surgery    . Cholecystectomy    . Partial hysterectomy      REVIEW OF SYSTEMS:  A comprehensive review of systems was negative.   PHYSICAL EXAMINATION: General appearance: alert, cooperative, fatigued and no distress Head: Normocephalic, without obvious abnormality, atraumatic Neck: no adenopathy, no JVD, supple, symmetrical, trachea midline and thyroid not enlarged, symmetric, no tenderness/mass/nodules Lymph nodes: Cervical, supraclavicular, and axillary nodes normal. Resp: wheezes bilaterally Back: symmetric, no curvature. ROM normal. No CVA tenderness. Cardio: regular rate and rhythm,  S1, S2 normal, no murmur, click, rub or gallop GI: soft, non-tender; bowel sounds normal; no masses,  no organomegaly Extremities: extremities normal, atraumatic, no cyanosis or edema  ECOG PERFORMANCE STATUS: 1 - Symptomatic but completely ambulatory  Blood pressure 135/71, pulse 78, temperature 98.3 F (36.8 C),  temperature source Oral, resp. rate 18, height '5\' 3"'$  (1.6 m), weight 175 lb 4.8 oz (79.516 kg), SpO2 94 %.  LABORATORY DATA: Lab Results  Component Value Date   WBC 8.6 12/12/2015   HGB 16.9* 12/12/2015   HCT 50.5* 12/12/2015   MCV 92.7 12/12/2015   PLT 161 12/12/2015      Chemistry      Component Value Date/Time   NA 141 12/12/2015 0846   NA 142 08/08/2015 2054   NA 143 12/24/2011 1311   K 4.6 12/12/2015 0846   K 4.3 08/08/2015 2054   K 4.7 12/24/2011 1311   CL 109 08/08/2015 2054   CL 106 10/06/2012 1303   CL 99 12/24/2011 1311   CO2 27 12/12/2015 0846   CO2 28 08/08/2015 2054   CO2 31 12/24/2011 1311   BUN 13.2 12/12/2015 0846   BUN 13 08/08/2015 2054   BUN 13 12/24/2011 1311   CREATININE 0.9 12/12/2015 0846   CREATININE 0.74 08/08/2015 2054   CREATININE 1.2 12/24/2011 1311      Component Value Date/Time   CALCIUM 9.9 12/12/2015 0846   CALCIUM 9.1 08/08/2015 2054   CALCIUM 10.2 12/24/2011 1311   ALKPHOS 83 12/12/2015 0846   ALKPHOS 88 06/15/2015 1035   ALKPHOS 98* 12/24/2011 1311   AST 13 12/12/2015 0846   AST 13* 06/15/2015 1035   AST 18 12/24/2011 1311   ALT 11 12/12/2015 0846   ALT 13* 06/15/2015 1035   ALT 18 12/24/2011 1311   BILITOT 0.44 12/12/2015 0846   BILITOT 0.3 06/15/2015 1035   BILITOT 0.50 12/24/2011 1311       RADIOGRAPHIC STUDIES: Ct Chest W Contrast  12/12/2015  ADDENDUM REPORT: 12/12/2015 10:36 ADDENDUM: 12 mm cystic lesion body of pancreas unchanged in the interval. Attention on follow-up recommended. Electronically Signed   By: Misty Stanley M.D.   On: 12/12/2015 10:36  12/12/2015  CLINICAL DATA:  Metastatic non-small cell lung cancer with presenting July 2012 with locally advanced thoracic disease and brain metastases. EXAM: CT CHEST, ABDOMEN, AND PELVIS WITH CONTRAST TECHNIQUE: Multidetector CT imaging of the chest, abdomen and pelvis was performed following the standard protocol during bolus administration of intravenous contrast.  CONTRAST:  131m ISOVUE-300 IOPAMIDOL (ISOVUE-300) INJECTION 61% COMPARISON:  06/20/2015 FINDINGS: CT CHEST FINDINGS Mediastinum/Lymph Nodes: There is no axillary lymphadenopathy. 11 mm short axis precarinal lymph node is stable. 10 mm short axis right hilar lymph node is stable. Stable small subcarinal lymph node. Post radiation change noted in the left hilum. The heart size is normal. No pericardial effusion. Moderate pericardial fluid collection is stable. The esophagus has normal imaging features. Coronary artery calcification is noted. Lungs/Pleura: Moderate changes of paraseptal and centrilobular emphysema again noted. aPostradiation change in the right lower lobe has become more confluent in the interval. 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 Musculoskeletal: Bone windows reveal no worrisome lytic or sclerotic osseous lesions. CT ABDOMEN PELVIS FINDINGS Hepatobiliary: No focal abnormality within the liver parenchyma. Gallbladder surgically absent. No intrahepatic or extrahepatic biliary dilation. Pancreas: 12 mm cystic lesion pancreatic body (image 55 series 2) stable in the interval. Spleen: No splenomegaly. No focal mass lesion. Adrenals/Urinary Tract: No adrenal nodule  or mass. Kidneys unremarkable. No evidence for hydroureter. The urinary bladder appears normal for the degree of distention. Stomach/Bowel: Stomach is nondistended. No gastric wall thickening. No evidence of outlet obstruction. Duodenum is normally positioned as is the ligament of Treitz. No small bowel wall thickening. No small bowel dilatation. The terminal ileum is normal. The appendix is normal. No gross colonic mass. No colonic wall thickening. No substantial diverticular change. Vascular/Lymphatic: Abdominal aortic aneurysm measures 5.4 x 5.9 cm today compared to 5.4 x 6.0 cm previously. There is no gastrohepatic or hepatoduodenal ligament lymphadenopathy. No intraperitoneal or retroperitoneal lymphadenopathy.  Reproductive: Uterus surgically absent.  There is no adnexal mass. Other: No intraperitoneal free fluid. Musculoskeletal: Bone windows reveal no worrisome lytic or sclerotic osseous lesions. L2 compression fracture unchanged. IMPRESSION: 1. Stable appearance of borderline precarinal and right hilar lymphadenopathy. 2. Treatment changes in the right lower lobe become more confluent in the interval raising concern for local recurrence. Close attention in this region recommended on follow-up. 3. Stable appearance of fibrotic change left parahilar lung, compatible with previous radiation. 4. Stable appearance moderate pericardial effusion. 5. No evidence for metastatic disease in the abdomen or pelvis. 6. Abdominal aortic aneurysm, stable. Electronically Signed: By: Misty Stanley M.D. On: 12/12/2015 10:28   Ct Abdomen Pelvis W Contrast  12/12/2015  ADDENDUM REPORT: 12/12/2015 10:36 ADDENDUM: 12 mm cystic lesion body of pancreas unchanged in the interval. Attention on follow-up recommended. Electronically Signed   By: Misty Stanley M.D.   On: 12/12/2015 10:36  12/12/2015  CLINICAL DATA:  Metastatic non-small cell lung cancer with presenting July 2012 with locally advanced thoracic disease and brain metastases. EXAM: CT CHEST, ABDOMEN, AND PELVIS WITH CONTRAST TECHNIQUE: Multidetector CT imaging of the chest, abdomen and pelvis was performed following the standard protocol during bolus administration of intravenous contrast. CONTRAST:  170m ISOVUE-300 IOPAMIDOL (ISOVUE-300) INJECTION 61% COMPARISON:  06/20/2015 FINDINGS: CT CHEST FINDINGS Mediastinum/Lymph Nodes: There is no axillary lymphadenopathy. 11 mm short axis precarinal lymph node is stable. 10 mm short axis right hilar lymph node is stable. Stable small subcarinal lymph node. Post radiation change noted in the left hilum. The heart size is normal. No pericardial effusion. Moderate pericardial fluid collection is stable. The esophagus has normal imaging  features. Coronary artery calcification is noted. Lungs/Pleura: Moderate changes of paraseptal and centrilobular emphysema again noted. aPostradiation change in the right lower lobe has become more confluent in the interval. 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 Musculoskeletal: Bone windows reveal no worrisome lytic or sclerotic osseous lesions. CT ABDOMEN PELVIS FINDINGS Hepatobiliary: No focal abnormality within the liver parenchyma. Gallbladder surgically absent. No intrahepatic or extrahepatic biliary dilation. Pancreas: 12 mm cystic lesion pancreatic body (image 55 series 2) stable in the interval. Spleen: No splenomegaly. No focal mass lesion. Adrenals/Urinary Tract: No adrenal nodule or mass. Kidneys unremarkable. No evidence for hydroureter. The urinary bladder appears normal for the degree of distention. Stomach/Bowel: Stomach is nondistended. No gastric wall thickening. No evidence of outlet obstruction. Duodenum is normally positioned as is the ligament of Treitz. No small bowel wall thickening. No small bowel dilatation. The terminal ileum is normal. The appendix is normal. No gross colonic mass. No colonic wall thickening. No substantial diverticular change. Vascular/Lymphatic: Abdominal aortic aneurysm measures 5.4 x 5.9 cm today compared to 5.4 x 6.0 cm previously. There is no gastrohepatic or hepatoduodenal ligament lymphadenopathy. No intraperitoneal or retroperitoneal lymphadenopathy. Reproductive: Uterus surgically absent.  There is no adnexal mass. Other: No intraperitoneal free  fluid. Musculoskeletal: Bone windows reveal no worrisome lytic or sclerotic osseous lesions. L2 compression fracture unchanged. IMPRESSION: 1. Stable appearance of borderline precarinal and right hilar lymphadenopathy. 2. Treatment changes in the right lower lobe become more confluent in the interval raising concern for local recurrence. Close attention in this region recommended on follow-up. 3.  Stable appearance of fibrotic change left parahilar lung, compatible with previous radiation. 4. Stable appearance moderate pericardial effusion. 5. No evidence for metastatic disease in the abdomen or pelvis. 6. Abdominal aortic aneurysm, stable. Electronically Signed: By: Misty Stanley M.D. On: 12/12/2015 10:28   ASSESSMENT AND PLAN: This is a very pleasant 65 years old white female with metastatic non-small cell lung cancer, adenocarcinoma status post stereotactic radiotherapy to brain lesion as well as concurrent chemoradiation and consolidation systemic chemotherapy with carboplatin and Alimta status post 3 cycles. The recent CT scan of the chest, abdomen and pelvis performed recently showed no evidence for disease progression except for the treatment changes in the right lower lobe that became more confluent concerning for recurrence and will need close monitoring on upcoming scans. I discussed the scan results with the patient today. I recommended for her to continue on observation with repeat CT scan of the chest in 6 months. She will call immediately if she has any concerning symptoms in the interval. I counseled the patient today about the smoke cessation but unfortunately she is not willing to quit smoking. The patient voices understanding of current disease status and treatment options and is in agreement with the current care plan. All questions were answered. The patient knows to call the clinic with any problems, questions or concerns. We can certainly see the patient much sooner if necessary.  Disclaimer: This note was dictated with voice recognition software. Similar sounding words can inadvertently be transcribed and may not be corrected upon review.

## 2016-01-03 ENCOUNTER — Ambulatory Visit
Admission: RE | Admit: 2016-01-03 | Discharge: 2016-01-03 | Disposition: A | Discharge status: Home / Self Care | Payer: Medicare Other | Source: Ambulatory Visit | Attending: Radiation Oncology | Admitting: Radiation Oncology

## 2016-01-03 ENCOUNTER — Encounter: Payer: Self-pay | Admitting: Radiation Oncology

## 2016-01-03 VITALS — BP 126/68 | HR 85 | Resp 16 | Wt 171.8 lb

## 2016-01-03 DIAGNOSIS — C7931 Secondary malignant neoplasm of brain: Secondary | ICD-10-CM | POA: Diagnosis present

## 2016-01-03 DIAGNOSIS — C7949 Secondary malignant neoplasm of other parts of nervous system: Secondary | ICD-10-CM

## 2016-01-03 DIAGNOSIS — I313 Pericardial effusion (noninflammatory): Secondary | ICD-10-CM | POA: Insufficient documentation

## 2016-01-03 DIAGNOSIS — I714 Abdominal aortic aneurysm, without rupture: Secondary | ICD-10-CM | POA: Diagnosis not present

## 2016-01-03 DIAGNOSIS — C3491 Malignant neoplasm of unspecified part of right bronchus or lung: Secondary | ICD-10-CM | POA: Insufficient documentation

## 2016-01-03 DIAGNOSIS — Z880 Allergy status to penicillin: Secondary | ICD-10-CM | POA: Diagnosis not present

## 2016-01-03 NOTE — Progress Notes (Signed)
Radiation Oncology         [phone] ________________________________  Name: Courtney West MRN: [id number]  Date: 01/03/2016  DOB: [birth year]  Follow-Up Visit Note  CC: No PCP Per Patient  Shirline Frees, MD  Diagnosis:  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   Interval Since Last Radiation:  3 years, 11 months  Narrative: [name] is a pleasant patient with a history of a right lung cancer, who comes today for follow-up. She is status  post: 1. SRS to a 10-mm left frontal and 5-mm right frontal brain metastasis to 20 Gy on January 16, 2011.  2. The patient's primary lung cancer was treated to 66 Gy in 33 fractions of 2 Gy through March 10, 2011 . 3. SRS to 6 new brain metastases (2 adjacent left temporal brain metastases, a 4.7-mm vermis target, a 3.4-mm right frontal brain metastasis, a left parietal 5.3-mm brain metastasis, and a 6.6-mm left cerebellar brain metastasis) received 20 Gy in a single fraction.April 18, 2011. 4. SRS to a 4 mm posterior frontal target to 20 Gy on 10/20/2011 SBRT to a 1.6 cm metachronous clinical stage IA primary cancer of the right lower lung on 01/19/12, 01/21/12, and 01/26/12 to 54 gray in 3 fractions of 18 gr She's been followed closely since her original treatment, however has refused recent MRI, and is followed clinically.  ROS:  A complete review of systems is obtained and is otherwise negative.                   ALLERGIES:  is allergic to sulfa antibiotics; carboplatin; codeine; dilaudid; and penicillins.  Meds: Current Outpatient Prescriptions  Medication Sig Dispense Refill  . albuterol (PROVENTIL HFA;VENTOLIN HFA) 108 (90 BASE) MCG/ACT inhaler Inhale 2 puffs into the lungs every 4 (four) hours as needed for wheezing or shortness of breath. 6.7 g 0  . gabapentin (NEURONTIN) 300 MG capsule Take 2 capsules (600 mg total) by mouth 3 (three) times daily. 180 capsule 6  . SYMBICORT 80-4.5  MCG/ACT inhaler INHALE TWO PUFFS BY MOUTH AS NEEDED 22 Inhaler 0  . ipratropium-albuterol (DUONEB) 0.5-2.5 (3) MG/3ML SOLN Take 3 mLs by nebulization every 6 (six) hours as needed. (Patient not taking: Reported on 12/19/2015) 360 mL 2   No current facility-administered medications for this encounter.    Physical Findings:  weight is 171 lb 12.8 oz (77.928 kg). Her blood pressure is 126/68 and her pulse is 85. Her respiration is 16 and oxygen saturation is 94%.   Pain scale 0/10 In general this is a well appearing Caucasian female in no acute distress. She's alert and oriented x4 and appropriate throughout the examination. Cardiopulmonary assessment is negative for acute distress and she exhibits normal effort.    Lab Findings: Lab Results  Component Value Date   WBC 8.6 12/12/2015   WBC 8.4 08/08/2015   HGB 16.9* 12/12/2015   HGB 14.8 08/08/2015   HCT 50.5* 12/12/2015   HCT 46.0 08/08/2015   PLT 161 12/12/2015   PLT 193 08/08/2015    Lab Results  Component Value Date   NA 141 12/12/2015   NA 142 08/08/2015   NA 143 12/24/2011   K 4.6 12/12/2015   K 4.3 08/08/2015   K 4.7 12/24/2011   CHLORIDE 105 12/12/2015   CO2 27 12/12/2015   CO2 28 08/08/2015   CO2 31 12/24/2011   GLUCOSE 99 12/12/2015   GLUCOSE  93 08/08/2015   GLUCOSE 102* 10/06/2012   GLUCOSE 113 12/24/2011   BUN 13.2 12/12/2015   BUN 13 08/08/2015   BUN 13 12/24/2011   CREATININE 0.9 12/12/2015   CREATININE 0.74 08/08/2015   CREATININE 1.2 12/24/2011   BILITOT 0.44 12/12/2015   BILITOT 0.3 06/15/2015   BILITOT 0.50 12/24/2011   ALKPHOS 83 12/12/2015   ALKPHOS 88 06/15/2015   ALKPHOS 98* 12/24/2011   AST 13 12/12/2015   AST 13* 06/15/2015   AST 18 12/24/2011   ALT 11 12/12/2015   ALT 13* 06/15/2015   ALT 18 12/24/2011   PROT 7.5 12/12/2015   PROT 7.1 06/15/2015   PROT 7.6 12/24/2011   ALBUMIN 3.8 12/12/2015   ALBUMIN 4.0 06/15/2015   ALBUMIN 3.5 12/24/2011   CALCIUM 9.9 12/12/2015   CALCIUM 9.1  08/08/2015   CALCIUM 10.2 12/24/2011   ANIONGAP 9 12/12/2015   ANIONGAP 5 08/08/2015    Radiographic Findings: Ct Chest W Contrast  12/12/2015  ADDENDUM REPORT: 12/12/2015 10:36 ADDENDUM: 12 mm cystic lesion body of pancreas unchanged in the interval. Attention on follow-up recommended. Electronically Signed   By: Misty Stanley M.D.   On: 12/12/2015 10:36  12/12/2015  CLINICAL DATA:  Metastatic non-small cell lung cancer with presenting July 2012 with locally advanced thoracic disease and brain metastases. EXAM: CT CHEST, ABDOMEN, AND PELVIS WITH CONTRAST TECHNIQUE: Multidetector CT imaging of the chest, abdomen and pelvis was performed following the standard protocol during bolus administration of intravenous contrast. CONTRAST:  12m ISOVUE-300 IOPAMIDOL (ISOVUE-300) INJECTION 61% COMPARISON:  06/20/2015 FINDINGS: CT CHEST FINDINGS Mediastinum/Lymph Nodes: There is no axillary lymphadenopathy. 11 mm short axis precarinal lymph node is stable. 10 mm short axis right hilar lymph node is stable. Stable small subcarinal lymph node. Post radiation change noted in the left hilum. The heart size is normal. No pericardial effusion. Moderate pericardial fluid collection is stable. The esophagus has normal imaging features. Coronary artery calcification is noted. Lungs/Pleura: Moderate changes of paraseptal and centrilobular emphysema again noted. aPostradiation change in the right lower lobe has become more confluent in the interval. 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 Musculoskeletal: Bone windows reveal no worrisome lytic or sclerotic osseous lesions. CT ABDOMEN PELVIS FINDINGS Hepatobiliary: No focal abnormality within the liver parenchyma. Gallbladder surgically absent. No intrahepatic or extrahepatic biliary dilation. Pancreas: 12 mm cystic lesion pancreatic body (image 55 series 2) stable in the interval. Spleen: No splenomegaly. No focal mass lesion. Adrenals/Urinary Tract: No  adrenal nodule or mass. Kidneys unremarkable. No evidence for hydroureter. The urinary bladder appears normal for the degree of distention. Stomach/Bowel: Stomach is nondistended. No gastric wall thickening. No evidence of outlet obstruction. Duodenum is normally positioned as is the ligament of Treitz. No small bowel wall thickening. No small bowel dilatation. The terminal ileum is normal. The appendix is normal. No gross colonic mass. No colonic wall thickening. No substantial diverticular change. Vascular/Lymphatic: Abdominal aortic aneurysm measures 5.4 x 5.9 cm today compared to 5.4 x 6.0 cm previously. There is no gastrohepatic or hepatoduodenal ligament lymphadenopathy. No intraperitoneal or retroperitoneal lymphadenopathy. Reproductive: Uterus surgically absent.  There is no adnexal mass. Other: No intraperitoneal free fluid. Musculoskeletal: Bone windows reveal no worrisome lytic or sclerotic osseous lesions. L2 compression fracture unchanged. IMPRESSION: 1. Stable appearance of borderline precarinal and right hilar lymphadenopathy. 2. Treatment changes in the right lower lobe become more confluent in the interval raising concern for local recurrence. Close attention in this region recommended on follow-up. 3.  Stable appearance of fibrotic change left parahilar lung, compatible with previous radiation. 4. Stable appearance moderate pericardial effusion. 5. No evidence for metastatic disease in the abdomen or pelvis. 6. Abdominal aortic aneurysm, stable. Electronically Signed: By: Misty Stanley M.D. On: 12/12/2015 10:28   Ct Abdomen Pelvis W Contrast  12/12/2015  ADDENDUM REPORT: 12/12/2015 10:36 ADDENDUM: 12 mm cystic lesion body of pancreas unchanged in the interval. Attention on follow-up recommended. Electronically Signed   By: Misty Stanley M.D.   On: 12/12/2015 10:36  12/12/2015  CLINICAL DATA:  Metastatic non-small cell lung cancer with presenting July 2012 with locally advanced thoracic disease  and brain metastases. EXAM: CT CHEST, ABDOMEN, AND PELVIS WITH CONTRAST TECHNIQUE: Multidetector CT imaging of the chest, abdomen and pelvis was performed following the standard protocol during bolus administration of intravenous contrast. CONTRAST:  127m ISOVUE-300 IOPAMIDOL (ISOVUE-300) INJECTION 61% COMPARISON:  06/20/2015 FINDINGS: CT CHEST FINDINGS Mediastinum/Lymph Nodes: There is no axillary lymphadenopathy. 11 mm short axis precarinal lymph node is stable. 10 mm short axis right hilar lymph node is stable. Stable small subcarinal lymph node. Post radiation change noted in the left hilum. The heart size is normal. No pericardial effusion. Moderate pericardial fluid collection is stable. The esophagus has normal imaging features. Coronary artery calcification is noted. Lungs/Pleura: Moderate changes of paraseptal and centrilobular emphysema again noted. aPostradiation change in the right lower lobe has become more confluent in the interval. 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 Musculoskeletal: Bone windows reveal no worrisome lytic or sclerotic osseous lesions. CT ABDOMEN PELVIS FINDINGS Hepatobiliary: No focal abnormality within the liver parenchyma. Gallbladder surgically absent. No intrahepatic or extrahepatic biliary dilation. Pancreas: 12 mm cystic lesion pancreatic body (image 55 series 2) stable in the interval. Spleen: No splenomegaly. No focal mass lesion. Adrenals/Urinary Tract: No adrenal nodule or mass. Kidneys unremarkable. No evidence for hydroureter. The urinary bladder appears normal for the degree of distention. Stomach/Bowel: Stomach is nondistended. No gastric wall thickening. No evidence of outlet obstruction. Duodenum is normally positioned as is the ligament of Treitz. No small bowel wall thickening. No small bowel dilatation. The terminal ileum is normal. The appendix is normal. No gross colonic mass. No colonic wall thickening. No substantial diverticular  change. Vascular/Lymphatic: Abdominal aortic aneurysm measures 5.4 x 5.9 cm today compared to 5.4 x 6.0 cm previously. There is no gastrohepatic or hepatoduodenal ligament lymphadenopathy. No intraperitoneal or retroperitoneal lymphadenopathy. Reproductive: Uterus surgically absent.  There is no adnexal mass. Other: No intraperitoneal free fluid. Musculoskeletal: Bone windows reveal no worrisome lytic or sclerotic osseous lesions. L2 compression fracture unchanged. IMPRESSION: 1. Stable appearance of borderline precarinal and right hilar lymphadenopathy. 2. Treatment changes in the right lower lobe become more confluent in the interval raising concern for local recurrence. Close attention in this region recommended on follow-up. 3. Stable appearance of fibrotic change left parahilar lung, compatible with previous radiation. 4. Stable appearance moderate pericardial effusion. 5. No evidence for metastatic disease in the abdomen or pelvis. 6. Abdominal aortic aneurysm, stable. Electronically Signed: By: EMisty StanleyM.D. On: 12/12/2015 10:28    Impression:  S/P SRS to the brain and SBRT to the right lung for metastatic lung cancer.  No clinical evidence of recurrence  Plan:   The patient appears to be clinically doing well. She denies any new symptoms that are of concern. We will continue to follow her expectantly at six-month intervals.   ------------------------------------------------   MTyler Pita MD [facility]  Social research officer, government of [employer] Direct Dial: [phone]  Fax: [phone] [email or web address]  Skype  LinkedIn   This document serves as a record of services personally performed by Tyler Pita, MD. It was created on his behalf by Truddie Hidden, a trained medical scribe. The creation of this record is based on the scribe's personal observations and the provider's statements to them. This document has been checked and approved by  the attending provider.

## 2016-01-03 NOTE — Progress Notes (Signed)
Weight and vitals stable. Reports persistent right shoulder blade pain. Patient unable to rate or score this pain. Reports low back pain is managed with gabapentin. Reports a productive cough with clear to milky sputum.Reports SOB only with "long walks." Denies difficulty swallowing. Denies headaches, dizziness, nausea, vomiting, diplopia or ringing in the ears.   BP 126/68 mmHg  Pulse 85  Resp 16  Wt 171 lb 12.8 oz (77.928 kg)  SpO2 94% Wt Readings from Last 3 Encounters:  01/03/16 171 lb 12.8 oz (77.928 kg)  12/19/15 175 lb 4.8 oz (79.516 kg)  08/09/15 174 lb 8 oz (79.153 kg)

## 2016-01-08 NOTE — Addendum Note (Signed)
Encounter addended by: Heywood Footman, RN on: 01/08/2016 11:05 AM<BR>     Documentation filed: Charges VN

## 2016-02-22 ENCOUNTER — Emergency Department (HOSPITAL_COMMUNITY)
Admission: EM | Admit: 2016-02-22 | Discharge: 2016-02-22 | Disposition: A | Discharge status: Home / Self Care | Payer: Medicare Other | Attending: Emergency Medicine | Admitting: Emergency Medicine

## 2016-02-22 ENCOUNTER — Emergency Department (HOSPITAL_COMMUNITY): Payer: Medicare Other

## 2016-02-22 ENCOUNTER — Encounter (HOSPITAL_COMMUNITY): Payer: Self-pay | Admitting: Emergency Medicine

## 2016-02-22 DIAGNOSIS — S299XXA Unspecified injury of thorax, initial encounter: Secondary | ICD-10-CM | POA: Diagnosis present

## 2016-02-22 DIAGNOSIS — W010XXA Fall on same level from slipping, tripping and stumbling without subsequent striking against object, initial encounter: Secondary | ICD-10-CM | POA: Diagnosis not present

## 2016-02-22 DIAGNOSIS — S7002XA Contusion of left hip, initial encounter: Secondary | ICD-10-CM

## 2016-02-22 DIAGNOSIS — F17210 Nicotine dependence, cigarettes, uncomplicated: Secondary | ICD-10-CM | POA: Insufficient documentation

## 2016-02-22 DIAGNOSIS — Z79899 Other long term (current) drug therapy: Secondary | ICD-10-CM | POA: Insufficient documentation

## 2016-02-22 DIAGNOSIS — I10 Essential (primary) hypertension: Secondary | ICD-10-CM | POA: Diagnosis not present

## 2016-02-22 DIAGNOSIS — Z85841 Personal history of malignant neoplasm of brain: Secondary | ICD-10-CM | POA: Insufficient documentation

## 2016-02-22 DIAGNOSIS — J441 Chronic obstructive pulmonary disease with (acute) exacerbation: Secondary | ICD-10-CM | POA: Diagnosis not present

## 2016-02-22 DIAGNOSIS — Z85118 Personal history of other malignant neoplasm of bronchus and lung: Secondary | ICD-10-CM | POA: Insufficient documentation

## 2016-02-22 DIAGNOSIS — Y939 Activity, unspecified: Secondary | ICD-10-CM | POA: Diagnosis not present

## 2016-02-22 DIAGNOSIS — Y929 Unspecified place or not applicable: Secondary | ICD-10-CM | POA: Diagnosis not present

## 2016-02-22 DIAGNOSIS — Y999 Unspecified external cause status: Secondary | ICD-10-CM | POA: Insufficient documentation

## 2016-02-22 DIAGNOSIS — S20212A Contusion of left front wall of thorax, initial encounter: Secondary | ICD-10-CM

## 2016-02-22 MED ORDER — NAPROXEN 500 MG PO TABS: 500.0000 mg | ORAL_TABLET | Freq: Two times a day (BID) | ORAL | 0 refills | Status: DC

## 2016-02-22 MED ORDER — TRAMADOL HCL 50 MG PO TABS: 50.0000 mg | ORAL_TABLET | Freq: Four times a day (QID) | ORAL | 0 refills | Status: DC | PRN

## 2016-02-22 NOTE — ED Notes (Signed)
Patient verbalizes understanding of discharge instructions, prescriptions, home care and follow up care. Patient out of department at this time. 

## 2016-02-22 NOTE — ED Triage Notes (Signed)
Patient states that she fell today. Patient states she tripped over a weed outside. Complains of left lower back, hip, and knee pain. Also, complains of right hand pain. NAD. Patient walked in triage.

## 2016-02-22 NOTE — Discharge Instructions (Signed)
Use a small pillow held to your side to cough and take deep breaths.  Follow-up with your doctor for recheck.  Return here for any worsening symptoms

## 2016-02-23 NOTE — ED Provider Notes (Signed)
[facility] DEPT Provider Note   CSN: [id number] Arrival date & time: 02/22/16  1822     History   Chief Complaint Chief Complaint  Patient presents with  . Fall    HPI Courtney West is a 65 y.o. female.  HPI  Courtney West is a 65 y.o. female who presents to the Emergency Department complaining of left rib and hip pain for several hours.  She reports a mechanical fall after trying to step over a weed, landing on her left side.  She is having pain to her left rib area that is worse with deep breathing and certain movements.  Hip pain is exacerbated with standing and walking.  She has not tried any therapies or medications prior to arrival.  She denies numbness or weakness of the LE's, abd pain, shortness of breath, head injury, neck pain or LOC.     Past Medical History:  Diagnosis Date  . Abdominal aortic aneurysm ([place])   . Allergy    codeine,pcn,sulfa drugs  . Brain cancer ([place])    mets from lung primary  . Clotted vascular catheter ([place]) 03/30/2012  . COPD (chronic obstructive pulmonary disease) ([place])   . Dyslipidemia   . History of radiation therapy 01/19/12, 01/21/12, 01/26/12   RLL lung  . History of radiation therapy 04/18/11   single fraction to 6 brain mets  . Hypertension   . Lung cancer ([place]) 12/2010  . Lung cancer ([place]) 12/22/2010  . Radiation 10/20/2011   frontal mets/Palliation  . S/P radiation therapy 7/12 thru 9/12, 04/18/11   xrt to brain mets  . Stroke [facility])     Patient Active Problem List   Diagnosis Date Noted  . COPD with exacerbation ([place]) 08/09/2015  . Acute respiratory failure with hypoxia ([place])   . CAP (community acquired pneumonia) 03/26/2015  . Mural thrombus of heart ([place]) 03/26/2015  . Hypertension 03/26/2015  . Dyslipidemia 03/26/2015  . Leukocytosis 03/26/2015  . COPD exacerbation ([place]) 03/26/2015  . Malnutrition of moderate degree 03/26/2015  . DVT (deep venous thrombosis) ([place]) 04/01/2012  . Clotted vascular catheter ([place]) 03/30/2012    . History of radiation therapy   . COPD (chronic obstructive pulmonary disease) ([place])   . Stroke ([place])   . Radiation 10/20/2011  . Brain Metastases   . Bronchogenic cancer of right lung ([place]) 05/10/2011    Past Surgical History:  Procedure Laterality Date  . CARPAL TUNNEL RELEASE    . CHOLECYSTECTOMY    . left knee surgery    . PARTIAL HYSTERECTOMY      OB History    No data available       Home Medications    Prior to Admission medications   Medication Sig Start Date End Date Taking? Authorizing Provider  albuterol (PROVENTIL HFA;VENTOLIN HFA) 108 (90 BASE) MCG/ACT inhaler Inhale 2 puffs into the lungs every 4 (four) hours as needed for wheezing or shortness of breath. 12/20/14  Yes Tyler Pita, MD  budesonide-formoterol (SYMBICORT) 80-4.5 MCG/ACT inhaler Inhale 2 puffs into the lungs 2 (two) times daily. 02/02/16 03/03/16 Yes Tyler Pita, MD  gabapentin (NEURONTIN) 300 MG capsule Take 2 capsules (600 mg total) by mouth 3 (three) times daily. 11/27/15  Yes Hayden Pedro, PA-C  ipratropium-albuterol (DUONEB) 0.5-2.5 (3) MG/3ML SOLN Take 3 mLs by nebulization every 6 (six) hours as needed. 03/28/15  Yes Oswald Hillock, MD  naproxen (NAPROSYN) 500 MG tablet Take 1 tablet (500 mg total) by mouth 2 (two) times daily with  a meal. 02/22/16   Shalie Schremp, PA-C  traMADol (ULTRAM) 50 MG tablet Take 1 tablet (50 mg total) by mouth every 6 (six) hours as needed. 02/22/16   Shakim Faith, PA-C    Family History Family History  Problem Relation Age of Onset  . Cancer Father     lung, leukemia  . Cancer Maternal Aunt     kidney    Social History Social History  Substance Use Topics  . Smoking status: Current Every Day Smoker    Packs/day: 1.00    Years: 50.00    Types: Cigarettes  . Smokeless tobacco: Never Used  . Alcohol use No     Allergies   Sulfa antibiotics; Carboplatin; Codeine; Dilaudid [hydromorphone hcl]; and Penicillins   Review of Systems Review of  Systems  Constitutional: Negative for chills and fever.  Respiratory: Negative for chest tightness and shortness of breath.   Cardiovascular: Positive for chest pain (left rib pain).  Gastrointestinal: Negative for abdominal pain, nausea and vomiting.  Genitourinary: Negative for difficulty urinating, dysuria and flank pain.  Musculoskeletal: Positive for arthralgias (left hip pain). Negative for back pain, joint swelling and neck pain.  Skin: Negative for color change and wound.  Neurological: Negative for dizziness, syncope, weakness, numbness and headaches.  All other systems reviewed and are negative.    Physical Exam Updated Vital Signs BP 149/86 (BP Location: Left Arm)   Pulse 85   Temp 98.5 F (36.9 C) (Oral)   Resp 16   Ht '5\' 3"'$  (1.6 m)   Wt 78 kg   SpO2 96%   BMI 30.47 kg/m   Physical Exam  Constitutional: She is oriented to person, place, and time. She appears well-developed and well-nourished. No distress.  HENT:  Head: Atraumatic.  Mouth/Throat: Oropharynx is clear and moist.  Eyes: EOM are normal. Pupils are equal, round, and reactive to light.  Neck: Normal range of motion.  Cardiovascular: Normal rate, regular rhythm and intact distal pulses.   Pulmonary/Chest: Effort normal and breath sounds normal. No respiratory distress. She exhibits tenderness (ttp of the left lateral ribs.  no crepitus or bony deformity,).  Abdominal: Soft. She exhibits no distension and no mass. There is no tenderness. There is no guarding.  Musculoskeletal: She exhibits tenderness. She exhibits no edema.  ttp of the left lateral hip.  No edema or bruising.  No shortening or external rotation of the LLE.  Sensation intact.    Neurological: She is alert and oriented to person, place, and time.  Skin: Skin is warm.  Psychiatric: She has a normal mood and affect.  Nursing note and vitals reviewed.    ED Treatments / Results  Labs (all labs ordered are listed, but only abnormal results  are displayed) Labs Reviewed - No data to display  EKG  EKG Interpretation None       Radiology Dg Ribs Unilateral W/chest Left  Result Date: 02/22/2016 CLINICAL DATA:  65 y/o F; left-sided rib pain. History of left rib fractures. EXAM: LEFT RIBS AND CHEST - 3+ VIEW COMPARISON:  12/12/2015 chest CT. FINDINGS: Stable enlarged cardiac silhouette may represent persistent pericardial effusion. Calcifications of the aortic arch. Linear opacities in the lung bases probably represents scarring and atelectasis and are similar in comparison with prior chest CT localizer. Right port catheter tip projects over mid SVC. No rib fractures identified. L2 compression deformity grossly stable in comparison the prior CT. IMPRESSION: No rib fracture is identified. Electronically Signed   By: Kristine Garbe  M.D.   On: 02/22/2016 20:08   Dg Hip Unilat W Or Wo Pelvis 2-3 Views Left  Result Date: 02/22/2016 CLINICAL DATA:  Acute left hip pain following fall today. Initial encounter. EXAM: DG HIP (WITH OR WITHOUT PELVIS) 2-3V LEFT COMPARISON:  None. FINDINGS: There is no evidence of hip fracture or dislocation. There is no evidence of arthropathy or other focal bone abnormality. IMPRESSION: Negative. Electronically Signed   By: Margarette Canada M.D.   On: 02/22/2016 20:06    Procedures Procedures (including critical care time)  Medications Ordered in ED Medications - No data to display   Initial Impression / Assessment and Plan / ED Course  I have reviewed the triage vital signs and the nursing notes.  Pertinent labs & imaging results that were available during my care of the patient were reviewed by me and considered in my medical decision making (see chart for details).  Clinical Course    XR's neg for fx.  Pt is ambulatory, steady gait.   no focal neuro deficits.  Vitals stable.  Tenderness of the lateral left ribs w/o guarding.  Likely contusions.  Pt agrees to symptomatic tx, ER return precautions  given.  Appears stable for d/c  Final Clinical Impressions(s) / ED Diagnoses   Final diagnoses:  Rib contusion, left, initial encounter  Contusion of left hip, initial encounter    New Prescriptions Discharge Medication List as of 02/22/2016  8:30 PM    START taking these medications   Details  naproxen (NAPROSYN) 500 MG tablet Take 1 tablet (500 mg total) by mouth 2 (two) times daily with a meal., Starting Fri 02/22/2016, Print    traMADol (ULTRAM) 50 MG tablet Take 1 tablet (50 mg total) by mouth every 6 (six) hours as needed., Starting Fri 02/22/2016, Print         Jesaiah Fabiano [place], PA-C 02/23/16 [place], MD 02/24/16 1305

## 2016-04-09 IMAGING — CT CT ANGIO CHEST
2 of 6 series · 6 of 36 positions shown · IV contrast (Omnipaque 300)
Comparison: CTA of the chest performed 03/26/2015, and chest
radiograph performed earlier today at [DATE] p.m.

CLINICAL DATA: Acute onset of intermittent worsening moderate
shortness of breath and nonproductive cough. Mid back pain,
radiating to the right side. Bilateral leg swelling. Initial
encounter.

EXAM:
CT ANGIOGRAPHY CHEST WITH CONTRAST
TECHNIQUE: Multidetector CT imaging of the chest was performed using the
standard protocol during bolus administration of intravenous
contrast. Multiplanar CT image reconstructions and MIPs were
obtained to evaluate the vascular anatomy.
CONTRAST:  100mL OMNIPAQUE IOHEXOL 350 MG/ML SOLN

[Series 5: pe 3.0 b40f · axial · 0.68mm/px · z∈[+79,+247]mm · 5 of 84 slices shown]
[im 14/84  lung]
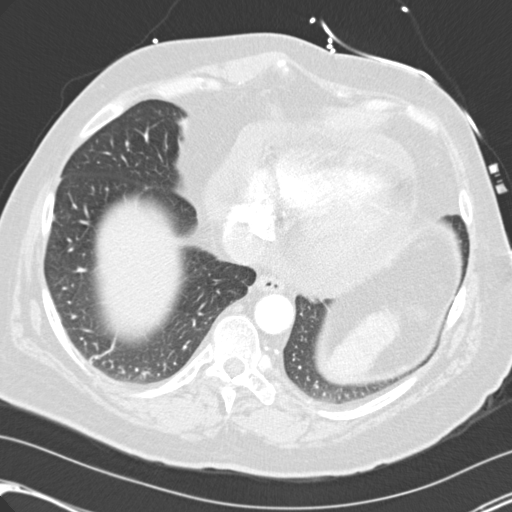
[im 28/84  mediastinal]
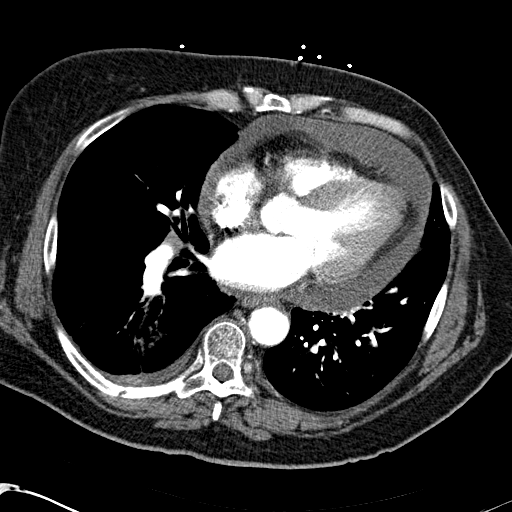
[im 42/84  lung]
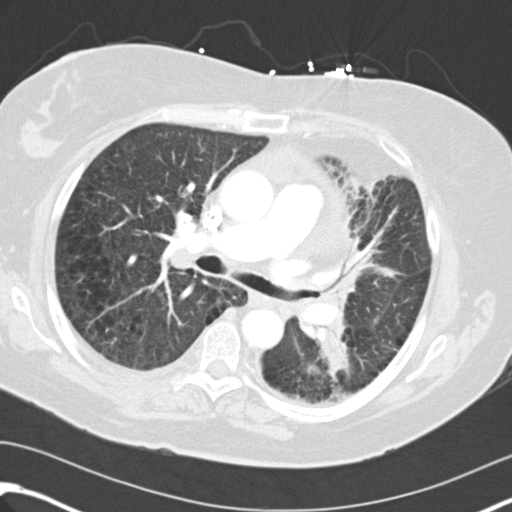
[im 56/84  mediastinal]
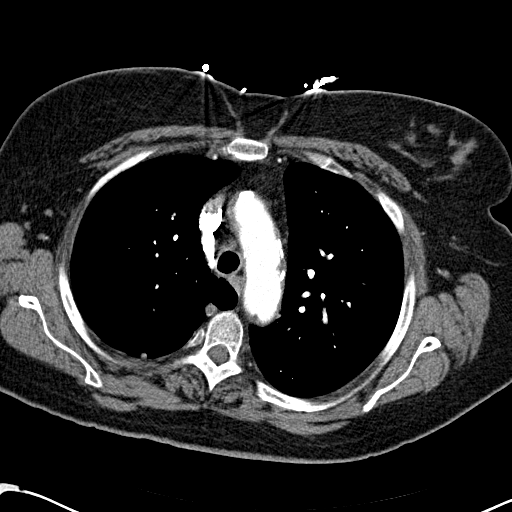
[im 70/84  lung]
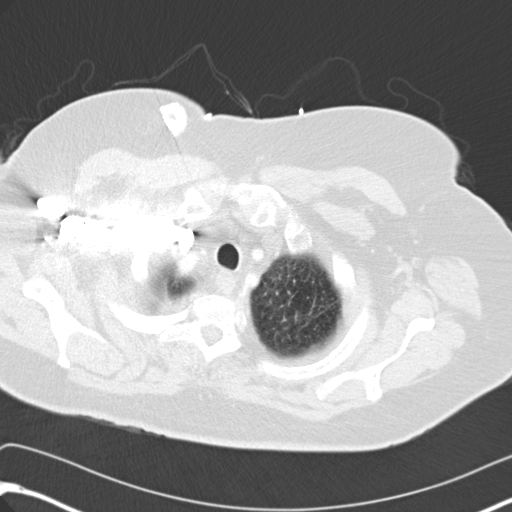

[Series 7: mpr coronal pe 3mm · coronal · 0.58mm/px · 1 of 91 slices shown]
[im 46/91  mediastinal]
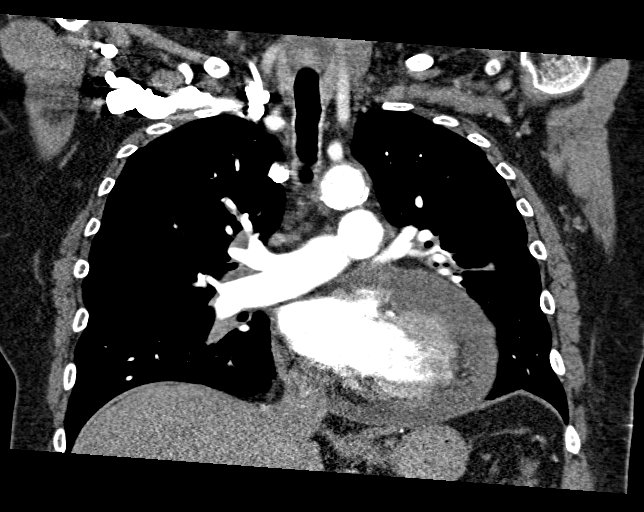

[6 of 36 positions shown; findings below may reference images not displayed]

FINDINGS: There is no evidence of pulmonary embolus.

Focal airspace opacity at the right lung base is perhaps slightly
improved from the prior study, though trace right-sided pleural
fluid is now seen. This may reflect an acute infectious process,
given the patient's symptoms. A 7 mm nodule is noted posteriorly
within the right upper lobe (image 29 of 84), slightly better
characterized than on the prior study due to prior atelectasis.

Airspace opacification about the left hilum is relatively stable and
likely reflects post radiation change. Underlying emphysematous
change is again noted at the upper lung lobes. There is no evidence
of pneumothorax.

A small to moderate pericardial effusion is noted, relatively stable
from the prior study. Previously noted azygoesophageal recess and
right hilar nodes are slightly less prominent, without evidence of
lymphadenopathy. Scattered calcification is noted along the aortic
arch. The great vessels are grossly unremarkable in appearance. A
right-sided chest port is noted. No axillary lymphadenopathy is
seen. The visualized portions of the thyroid gland are unremarkable
in appearance.

The visualized portions of the liver are unremarkable. Calcification
is again noted along the periphery of the spleen.

No acute osseous abnormalities are seen.

Review of the MIP images confirms the above findings.
IMPRESSION: 1. No evidence of pulmonary embolus.
2. Slight interval improvement in focal airspace opacity at the
right lung base, though trace right-sided pleural fluid is now seen.
This may reflect an acute infectious process, given the patient's
symptoms.
3. Left perihilar postradiation change is unchanged from the prior
study. 7 mm nodule noted posteriorly within the right upper lung
lobe. Underlying bilateral emphysematous change noted at the upper
lung lobes.
4. Small to moderate pericardial effusion is relatively stable in
appearance.

## 2016-04-11 ENCOUNTER — Telehealth: Payer: Self-pay | Admitting: Internal Medicine

## 2016-04-11 NOTE — Telephone Encounter (Signed)
FAXED PT RECORDS TO DR [facility] RELEASE ID [id number]

## 2016-06-17 ENCOUNTER — Ambulatory Visit (HOSPITAL_COMMUNITY)
Admission: RE | Admit: 2016-06-17 | Discharge: 2016-06-17 | Disposition: A | Discharge status: Home / Self Care | Payer: Medicare Other | Source: Ambulatory Visit | Attending: Internal Medicine | Admitting: Internal Medicine

## 2016-06-17 ENCOUNTER — Encounter (HOSPITAL_COMMUNITY): Payer: Self-pay

## 2016-06-17 ENCOUNTER — Other Ambulatory Visit (HOSPITAL_BASED_OUTPATIENT_CLINIC_OR_DEPARTMENT_OTHER): Payer: Medicare Other

## 2016-06-17 DIAGNOSIS — C3491 Malignant neoplasm of unspecified part of right bronchus or lung: Secondary | ICD-10-CM | POA: Insufficient documentation

## 2016-06-17 DIAGNOSIS — I2583 Coronary atherosclerosis due to lipid rich plaque: Secondary | ICD-10-CM | POA: Insufficient documentation

## 2016-06-17 DIAGNOSIS — C7949 Secondary malignant neoplasm of other parts of nervous system: Secondary | ICD-10-CM

## 2016-06-17 DIAGNOSIS — I2510 Atherosclerotic heart disease of native coronary artery without angina pectoris: Secondary | ICD-10-CM | POA: Diagnosis not present

## 2016-06-17 DIAGNOSIS — R911 Solitary pulmonary nodule: Secondary | ICD-10-CM | POA: Insufficient documentation

## 2016-06-17 DIAGNOSIS — I700 Atherosclerosis of aorta: Secondary | ICD-10-CM | POA: Insufficient documentation

## 2016-06-17 DIAGNOSIS — I719 Aortic aneurysm of unspecified site, without rupture: Secondary | ICD-10-CM | POA: Insufficient documentation

## 2016-06-17 DIAGNOSIS — C7931 Secondary malignant neoplasm of brain: Secondary | ICD-10-CM | POA: Diagnosis not present

## 2016-06-17 DIAGNOSIS — I313 Pericardial effusion (noninflammatory): Secondary | ICD-10-CM | POA: Diagnosis not present

## 2016-06-17 LAB — CBC WITH DIFFERENTIAL/PLATELET
BASO%: 1.5 % (ref 0.0–2.0)
Basophils Absolute: 0.1 10*3/uL (ref 0.0–0.1)
EOS ABS: 0.1 10*3/uL (ref 0.0–0.5)
EOS%: 1.3 % (ref 0.0–7.0)
HCT: 49.7 % — ABNORMAL HIGH (ref 34.8–46.6)
HEMOGLOBIN: 16.8 g/dL — AB (ref 11.6–15.9)
LYMPH#: 2.2 10*3/uL (ref 0.9–3.3)
LYMPH%: 24.1 % (ref 14.0–49.7)
MCH: 31.6 pg (ref 25.1–34.0)
MCHC: 33.8 g/dL (ref 31.5–36.0)
MCV: 93.4 fL (ref 79.5–101.0)
MONO#: 0.6 10*3/uL (ref 0.1–0.9)
MONO%: 6.2 % (ref 0.0–14.0)
NEUT%: 66.9 % (ref 38.4–76.8)
NEUTROS ABS: 6.1 10*3/uL (ref 1.5–6.5)
Platelets: 176 10*3/uL (ref 145–400)
RBC: 5.33 10*6/uL (ref 3.70–5.45)
RDW: 13.6 % (ref 11.2–14.5)
WBC: 9.2 10*3/uL (ref 3.9–10.3)

## 2016-06-17 LAB — COMPREHENSIVE METABOLIC PANEL
ALBUMIN: 3.7 g/dL (ref 3.5–5.0)
ALK PHOS: 90 U/L (ref 40–150)
ALT: 13 U/L (ref 0–55)
AST: 12 U/L (ref 5–34)
Anion Gap: 8 mEq/L (ref 3–11)
BILIRUBIN TOTAL: 0.42 mg/dL (ref 0.20–1.20)
BUN: 16.2 mg/dL (ref 7.0–26.0)
CO2: 28 mEq/L (ref 22–29)
CREATININE: 0.8 mg/dL (ref 0.6–1.1)
Calcium: 9.8 mg/dL (ref 8.4–10.4)
Chloride: 105 mEq/L (ref 98–109)
EGFR: 75 mL/min/{1.73_m2} — ABNORMAL LOW (ref >90)
GLUCOSE: 97 mg/dL (ref 70–140)
Potassium: 4 mEq/L (ref 3.5–5.1)
SODIUM: 141 meq/L (ref 136–145)
TOTAL PROTEIN: 7.3 g/dL (ref 6.4–8.3)

## 2016-06-17 MED ORDER — IOPAMIDOL (ISOVUE-300) INJECTION 61%
100.0000 mL | Freq: Once | INTRAVENOUS | Status: AC | PRN
  Administered 2016-06-17: 100 mL via INTRAVENOUS

## 2016-06-17 MED ORDER — IOPAMIDOL (ISOVUE-300) INJECTION 61%
INTRAVENOUS | Status: AC
  Filled 2016-06-17: qty 100

## 2016-06-25 ENCOUNTER — Encounter: Payer: Self-pay | Admitting: Internal Medicine

## 2016-06-25 ENCOUNTER — Telehealth: Payer: Self-pay | Admitting: Internal Medicine

## 2016-06-25 ENCOUNTER — Ambulatory Visit (HOSPITAL_BASED_OUTPATIENT_CLINIC_OR_DEPARTMENT_OTHER): Payer: Medicare Other | Admitting: Internal Medicine

## 2016-06-25 VITALS — BP 122/71 | HR 88 | Temp 98.4°F | Resp 21 | Wt 175.2 lb

## 2016-06-25 DIAGNOSIS — Z85118 Personal history of other malignant neoplasm of bronchus and lung: Secondary | ICD-10-CM | POA: Diagnosis not present

## 2016-06-25 DIAGNOSIS — J449 Chronic obstructive pulmonary disease, unspecified: Secondary | ICD-10-CM

## 2016-06-25 DIAGNOSIS — C3491 Malignant neoplasm of unspecified part of right bronchus or lung: Secondary | ICD-10-CM

## 2016-06-25 NOTE — Telephone Encounter (Signed)
Appointments scheduled per 1/3 LOS. Patient given AVS report and calendars with future scheduled appointments.

## 2016-06-25 NOTE — Telephone Encounter (Signed)
Two bottles of contrast given to patient.

## 2016-06-25 NOTE — Progress Notes (Signed)
[place] [phone]   [phone]  OFFICE PROGRESS NOTE  No PCP Per Patient No address on file  DIAGNOSIS: Metastatic non-small cell lung cancer, favoring adenocarcinoma diagnosed in July of 2012, presented with locally advanced disease in the chest as well as brain metastasis. Veristrat test poor.   PRIOR THERAPY:  1. Status post stereotactic radiotherapy to 2 brain lesions under the care of Dr. Lisbeth Renshaw. 2. Status post a course of concurrent chemoradiation with weekly carboplatin and paclitaxel, last dose of chemotherapy was given 03/05/2011. 3. Systemic chemotherapy with carboplatin for AUC of 5 and Alimta 500 mg/M2. The patient is status post 3 cycles. Last dose was given 06/03/2011  CURRENT THERAPY: Observation.  INTERVAL HISTORY: Courtney West 66 y.o. female came to the clinic today for routine follow-up visit. The patient is feeling fine today with no specific complaints. She intentionally lost few pounds since her last visit. She denied having any chest pain, shortness of breath, cough or hemoptysis. The patient has no nausea, vomiting, diarrhea or constipation. She has no fever or chills. Unfortunately she continues to smoke. She had repeat CT scan of the chest, abdomen and pelvis performed recently and she is here for evaluation and discussion of her scan results.  MEDICAL HISTORY: Past Medical History:  Diagnosis Date  . Abdominal aortic aneurysm ([place])   . Allergy    codeine,pcn,sulfa drugs  . Brain cancer ([place])    mets from lung primary  . Clotted vascular catheter ([place]) 03/30/2012  . COPD (chronic obstructive pulmonary disease) ([place])   . Dyslipidemia   . History of radiation therapy 01/19/12, 01/21/12, 01/26/12   RLL lung  . History of radiation therapy 04/18/11   single fraction to 6 brain mets  . Hypertension   . Lung cancer ([place]) 12/2010  . Lung cancer ([place]) 12/22/2010  . Radiation 10/20/2011   frontal mets/Palliation  . S/P  radiation therapy 7/12 thru 9/12, 04/18/11   xrt to brain mets  . Stroke [facility])     ALLERGIES:  is allergic to sulfa antibiotics; carboplatin; codeine; dilaudid [hydromorphone hcl]; and penicillins.  MEDICATIONS:  Current Outpatient Prescriptions  Medication Sig Dispense Refill  . albuterol (PROVENTIL HFA;VENTOLIN HFA) 108 (90 BASE) MCG/ACT inhaler Inhale 2 puffs into the lungs every 4 (four) hours as needed for wheezing or shortness of breath. 6.7 g 0  . budesonide-formoterol (SYMBICORT) 80-4.5 MCG/ACT inhaler Inhale 2 puffs into the lungs 2 (two) times daily. 6 g 0  . gabapentin (NEURONTIN) 300 MG capsule Take 2 capsules (600 mg total) by mouth 3 (three) times daily. 180 capsule 6  . ipratropium-albuterol (DUONEB) 0.5-2.5 (3) MG/3ML SOLN Take 3 mLs by nebulization every 6 (six) hours as needed. 360 mL 2  . naproxen (NAPROSYN) 500 MG tablet Take 1 tablet (500 mg total) by mouth 2 (two) times daily with a meal. 20 tablet 0  . traMADol (ULTRAM) 50 MG tablet Take 1 tablet (50 mg total) by mouth every 6 (six) hours as needed. 15 tablet 0   No current facility-administered medications for this visit.     SURGICAL HISTORY:  Past Surgical History:  Procedure Laterality Date  . CARPAL TUNNEL RELEASE    . CHOLECYSTECTOMY    . left knee surgery    . PARTIAL HYSTERECTOMY      REVIEW OF SYSTEMS:  A comprehensive review of systems was negative.   PHYSICAL EXAMINATION: General appearance: alert, cooperative and no distress Head: Normocephalic, without obvious abnormality,  atraumatic Neck: no adenopathy, no JVD, supple, symmetrical, trachea midline and thyroid not enlarged, symmetric, no tenderness/mass/nodules Lymph nodes: Cervical, supraclavicular, and axillary nodes normal. Resp: wheezes bilaterally Back: symmetric, no curvature. ROM normal. No CVA tenderness. Cardio: regular rate and rhythm, S1, S2 normal, no murmur, click, rub or gallop GI: soft, non-tender; bowel sounds normal; no  masses,  no organomegaly Extremities: extremities normal, atraumatic, no cyanosis or edema  ECOG PERFORMANCE STATUS: 0 - Asymptomatic  Blood pressure 122/71, pulse 88, temperature 98.4 F (36.9 C), temperature source Oral, resp. rate (!) 21, weight 175 lb 3.2 oz (79.5 kg), SpO2 93 %.  LABORATORY DATA: Lab Results  Component Value Date   WBC 9.2 06/17/2016   HGB 16.8 (H) 06/17/2016   HCT 49.7 (H) 06/17/2016   MCV 93.4 06/17/2016   PLT 176 06/17/2016      Chemistry      Component Value Date/Time   NA 141 06/17/2016 1044   K 4.0 06/17/2016 1044   CL 109 08/08/2015 2054   CL 106 10/06/2012 1303   CO2 28 06/17/2016 1044   BUN 16.2 06/17/2016 1044   CREATININE 0.8 06/17/2016 1044      Component Value Date/Time   CALCIUM 9.8 06/17/2016 1044   ALKPHOS 90 06/17/2016 1044   AST 12 06/17/2016 1044   ALT 13 06/17/2016 1044   BILITOT 0.42 06/17/2016 1044       RADIOGRAPHIC STUDIES: Ct Chest W Contrast  Result Date: 06/17/2016 CLINICAL DATA:  Restaging lung cancer. EXAM: CT CHEST, ABDOMEN, AND PELVIS WITH CONTRAST TECHNIQUE: Multidetector CT imaging of the chest, abdomen and pelvis was performed following the standard protocol during bolus administration of intravenous contrast. CONTRAST:  154m ISOVUE-300 IOPAMIDOL (ISOVUE-300) INJECTION 61% COMPARISON:  12/12/2015. FINDINGS: CT CHEST FINDINGS Cardiovascular: Right IJ Port-A-Cath terminates in the low SVC. Atherosclerotic calcification of the arterial vasculature, including coronary arteries. Heart size normal. Large pericardial effusion, similar. Mediastinum/Nodes: Low right paratracheal lymph node measures 1.4 cm, similar. Right hilar lymph nodes measure up to 11 mm, also similar. Left hilar lymphoid tissue. No axillary adenopathy. Esophagus is grossly unremarkable. Lungs/Pleura: Moderate centrilobular very mild paraseptal emphysema. Platelike collapse/ consolidation in the right lower lobe is unchanged. 4 mm left upper lobe nodule  (image 39), new. Post treatment volume loss, bronchiectasis and architectural distortion in the medial left hemithorax. Airway is unremarkable. Musculoskeletal: Right seventh and eighth posterior rib fractures with surrounding callus formation, likely posttraumatic. The effects of radiation therapy are also considered. CT ABDOMEN PELVIS FINDINGS Hepatobiliary: Liver is unremarkable. Cholecystectomy. No biliary ductal dilatation. Pancreas: Negative. Spleen: Calcification along the splenic capsule, as before. Adrenals/Urinary Tract: Adrenal glands and kidneys are unremarkable. Ureters are decompressed. Bladder is grossly unremarkable. Stomach/Bowel: Stomach, small bowel, appendix and colon are unremarkable. Vascular/Lymphatic: Atherosclerotic calcification of the arterial vasculature. Infrarenal aortic aneurysm measures 5.0 x 5.5 cm for a length of 6.7 cm. No pathologically enlarged lymph nodes. Reproductive: Hysterectomy.  No adnexal mass. Other: No free fluid. Mesenteries and peritoneum unremarkable. Small bilateral inguinal hernias contain fat. Musculoskeletal: No worrisome lytic or sclerotic lesions. L2 compression fracture, stable. IMPRESSION: 1. Stable post treatment changes in the hemithoraces bilaterally. 2. 4 mm left upper lobe nodule, new, nonspecific. Attention on followup exams is warranted. 3. Mild mediastinal and right hilar adenopathy, stable. 4. Large pericardial effusion, stable. 5. Infrarenal aortic aneurysm. Recommend followup by abdomen and pelvis CTA in 3-6 months, and vascular surgery referral/consultation if not already obtained. This recommendation follows ACR consensus guidelines: White Paper of the ACR  Incidental Findings Committee II on Vascular Findings. J Am Coll Radiol 2013; 10:789-794. 6. Aortic atherosclerosis (ICD10-170.0). Coronary artery calcification. Electronically Signed   By: Lorin Picket M.D.   On: 06/17/2016 13:08   Ct Abdomen Pelvis W Contrast  Result Date:  06/17/2016 CLINICAL DATA:  Restaging lung cancer. EXAM: CT CHEST, ABDOMEN, AND PELVIS WITH CONTRAST TECHNIQUE: Multidetector CT imaging of the chest, abdomen and pelvis was performed following the standard protocol during bolus administration of intravenous contrast. CONTRAST:  170m ISOVUE-300 IOPAMIDOL (ISOVUE-300) INJECTION 61% COMPARISON:  12/12/2015. FINDINGS: CT CHEST FINDINGS Cardiovascular: Right IJ Port-A-Cath terminates in the low SVC. Atherosclerotic calcification of the arterial vasculature, including coronary arteries. Heart size normal. Large pericardial effusion, similar. Mediastinum/Nodes: Low right paratracheal lymph node measures 1.4 cm, similar. Right hilar lymph nodes measure up to 11 mm, also similar. Left hilar lymphoid tissue. No axillary adenopathy. Esophagus is grossly unremarkable. Lungs/Pleura: Moderate centrilobular very mild paraseptal emphysema. Platelike collapse/ consolidation in the right lower lobe is unchanged. 4 mm left upper lobe nodule (image 39), new. Post treatment volume loss, bronchiectasis and architectural distortion in the medial left hemithorax. Airway is unremarkable. Musculoskeletal: Right seventh and eighth posterior rib fractures with surrounding callus formation, likely posttraumatic. The effects of radiation therapy are also considered. CT ABDOMEN PELVIS FINDINGS Hepatobiliary: Liver is unremarkable. Cholecystectomy. No biliary ductal dilatation. Pancreas: Negative. Spleen: Calcification along the splenic capsule, as before. Adrenals/Urinary Tract: Adrenal glands and kidneys are unremarkable. Ureters are decompressed. Bladder is grossly unremarkable. Stomach/Bowel: Stomach, small bowel, appendix and colon are unremarkable. Vascular/Lymphatic: Atherosclerotic calcification of the arterial vasculature. Infrarenal aortic aneurysm measures 5.0 x 5.5 cm for a length of 6.7 cm. No pathologically enlarged lymph nodes. Reproductive: Hysterectomy.  No adnexal mass. Other:  No free fluid. Mesenteries and peritoneum unremarkable. Small bilateral inguinal hernias contain fat. Musculoskeletal: No worrisome lytic or sclerotic lesions. L2 compression fracture, stable. IMPRESSION: 1. Stable post treatment changes in the hemithoraces bilaterally. 2. 4 mm left upper lobe nodule, new, nonspecific. Attention on followup exams is warranted. 3. Mild mediastinal and right hilar adenopathy, stable. 4. Large pericardial effusion, stable. 5. Infrarenal aortic aneurysm. Recommend followup by abdomen and pelvis CTA in 3-6 months, and vascular surgery referral/consultation if not already obtained. This recommendation follows ACR consensus guidelines: White Paper of the ACR Incidental Findings Committee II on Vascular Findings. J Am Coll Radiol 2013; 10:789-794. 6. Aortic atherosclerosis (ICD10-170.0). Coronary artery calcification. Electronically Signed   By: MLorin PicketM.D.   On: 06/17/2016 13:08   ASSESSMENT AND PLAN:  This is a very pleasant 66years old white female with metastatic non-small cell lung cancer status post stereotactic radiotherapy to the brain as well as a course of concurrent chemoradiation followed by systemic chemotherapy with carboplatin and Alimta. The patient has been on observation for the last 5 years with no clear evidence for disease progression. Her recent CT scan of the chest, abdomen and pelvis showed no evidence for disease recurrence. I discussed the scan results with the patient today. I recommended for her to continue on observation with repeat CT scan of the chest, abdomen and pelvis in 6 months for restaging of her disease. The patient requested removal of the Port-A-Cath and I will refer her to interventional radiology for this procedure. She was advised to call immediately if she has any concerning symptoms in the interval. I counseled the patient today about the smoke cessation but unfortunately she is not willing to quit smoking. The patient  voices understanding of current  disease status and treatment options and is in agreement with the current care plan. All questions were answered. The patient knows to call the clinic with any problems, questions or concerns. We can certainly see the patient much sooner if necessary. I spent 10 minutes counseling the patient face to face. The total time spent in the appointment was 15 minutes. Disclaimer: This note was dictated with voice recognition software. Similar sounding words can inadvertently be transcribed and may not be corrected upon review.

## 2016-06-30 ENCOUNTER — Other Ambulatory Visit: Payer: Self-pay | Admitting: Radiology

## 2016-07-01 ENCOUNTER — Other Ambulatory Visit: Payer: Self-pay | Admitting: General Surgery

## 2016-07-02 ENCOUNTER — Other Ambulatory Visit (HOSPITAL_COMMUNITY): Payer: Medicare Other

## 2016-07-02 ENCOUNTER — Ambulatory Visit (HOSPITAL_COMMUNITY): Admission: RE | Admit: 2016-07-02 | Payer: Medicare Other | Source: Ambulatory Visit

## 2016-07-14 ENCOUNTER — Ambulatory Visit
Admission: RE | Admit: 2016-07-14 | Discharge: 2016-07-14 | Disposition: A | Discharge status: Home / Self Care | Payer: Medicare Other | Source: Ambulatory Visit | Attending: Radiation Oncology | Admitting: Radiation Oncology

## 2016-07-14 VITALS — BP 148/86 | HR 79 | Temp 98.3°F | Resp 18 | Wt 178.0 lb

## 2016-07-14 DIAGNOSIS — Z880 Allergy status to penicillin: Secondary | ICD-10-CM | POA: Diagnosis not present

## 2016-07-14 DIAGNOSIS — E785 Hyperlipidemia, unspecified: Secondary | ICD-10-CM | POA: Diagnosis not present

## 2016-07-14 DIAGNOSIS — J449 Chronic obstructive pulmonary disease, unspecified: Secondary | ICD-10-CM | POA: Insufficient documentation

## 2016-07-14 DIAGNOSIS — F17210 Nicotine dependence, cigarettes, uncomplicated: Secondary | ICD-10-CM | POA: Diagnosis not present

## 2016-07-14 DIAGNOSIS — Z8673 Personal history of transient ischemic attack (TIA), and cerebral infarction without residual deficits: Secondary | ICD-10-CM | POA: Insufficient documentation

## 2016-07-14 DIAGNOSIS — Z9049 Acquired absence of other specified parts of digestive tract: Secondary | ICD-10-CM | POA: Diagnosis not present

## 2016-07-14 DIAGNOSIS — Z90711 Acquired absence of uterus with remaining cervical stump: Secondary | ICD-10-CM | POA: Insufficient documentation

## 2016-07-14 DIAGNOSIS — I10 Essential (primary) hypertension: Secondary | ICD-10-CM | POA: Insufficient documentation

## 2016-07-14 DIAGNOSIS — Z8051 Family history of malignant neoplasm of kidney: Secondary | ICD-10-CM | POA: Diagnosis not present

## 2016-07-14 DIAGNOSIS — C7931 Secondary malignant neoplasm of brain: Secondary | ICD-10-CM | POA: Insufficient documentation

## 2016-07-14 DIAGNOSIS — Z882 Allergy status to sulfonamides status: Secondary | ICD-10-CM | POA: Insufficient documentation

## 2016-07-14 DIAGNOSIS — Z923 Personal history of irradiation: Secondary | ICD-10-CM | POA: Diagnosis not present

## 2016-07-14 DIAGNOSIS — C3490 Malignant neoplasm of unspecified part of unspecified bronchus or lung: Secondary | ICD-10-CM | POA: Insufficient documentation

## 2016-07-14 DIAGNOSIS — C3491 Malignant neoplasm of unspecified part of right bronchus or lung: Secondary | ICD-10-CM

## 2016-07-14 DIAGNOSIS — I714 Abdominal aortic aneurysm, without rupture: Secondary | ICD-10-CM | POA: Insufficient documentation

## 2016-07-14 DIAGNOSIS — Z801 Family history of malignant neoplasm of trachea, bronchus and lung: Secondary | ICD-10-CM | POA: Diagnosis not present

## 2016-07-14 DIAGNOSIS — C7949 Secondary malignant neoplasm of other parts of nervous system: Secondary | ICD-10-CM

## 2016-07-14 DIAGNOSIS — Z885 Allergy status to narcotic agent status: Secondary | ICD-10-CM | POA: Diagnosis not present

## 2016-07-14 NOTE — Addendum Note (Signed)
Encounter addended by: Heywood Footman, RN on: 07/14/2016  1:36 PM<BR>    Actions taken: Charge Capture section accepted

## 2016-07-14 NOTE — Progress Notes (Signed)
Weight and vitals stable. Denies pain. Mourning the loss of her 66 year old son. Reports a productive cough. Denies hemoptysis. Denies pain or difficulty associated with swallowing. Reports shortness of breath continues with exertion. Reports she manages SOB with albuterol and Symbicort. Denies headache, dizziness, nausea, vomiting, diplopia or tinnitus. Steady gait noted. Reports fatigue.   BP (!) 148/86 (BP Location: Left Arm, Patient Position: Sitting, Cuff Size: Normal)   Pulse 79   Temp 98.3 F (36.8 C) (Oral)   Resp 18   Wt 178 lb (80.7 kg)   SpO2 96%   BMI 31.53 kg/m  Wt Readings from Last 3 Encounters:  07/14/16 178 lb (80.7 kg)  06/25/16 175 lb 3.2 oz (79.5 kg)  02/22/16 172 lb (78 kg)

## 2016-07-14 NOTE — Progress Notes (Signed)
Radiation Oncology         [phone] ________________________________  Name: Courtney West MRN: [id number]  Date: 07/14/2016  DOB: [birth year]  Follow-Up Visit Note  CC: No PCP Per Patient  Shirline Frees, MD  Diagnosis:  Courtney West is a 66 y.o. 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   Interval Since Last Radiation:  3 years, 5 months  Narrative: Courtney West is a pleasant patient with a history of a right lung cancer, who comes today for follow-up. She is status  Post : 1. SRS to a 10-mm left frontal and 5-mm right frontal brain metastasis to 20 Gy on January 16, 2011.  2. The patient's primary lung cancer was treated to 66 Gy in 33 fractions of 2 Gy through March 10, 2011 . 3. SRS to 6 new brain metastases (2 adjacent left temporal brain metastases, a 4.7-mm vermis target, a 3.4-mm right frontal brain metastasis, a left parietal 5.3-mm brain metastasis, and a 6.6-mm left cerebellar brain metastasis) received 20 Gy in a single fraction.April 18, 2011. 4. SRS to a 4 mm posterior frontal target to 20 Gy on 10/20/2011       5. SBRT to a 1.6 cm metachronous clinical stage IA primary cancer of the right lower lung on 01/19/12, 01/21/12, and 01/26/12 to 54 gray in 3 fractions of 18 gr  She's been followed closely since her original treatment, however has refused recent MRI, and is followed clinically. She did have a repeat CT of the chest/abdomen/pelvis on 06/17/16 and at that time she had a    On review of systems, the patient reports that she is doing poorly overall due to the recent loss of her son. She reports she has not been resting well or sleeping well. She denies any interest in any activity. She denies any chest pain, shortness of breath, cough, fevers, chills, night sweats, unintended weight changes. She denies any bowel or bladder disturbances, and denies abdominal pain, nausea or vomiting. She denies any new musculoskeletal or joint aches or pains, new  skin lesions or concerns. A complete review of systems is obtained and is otherwise negative.  Past Medical History:  Past Medical History:  Diagnosis Date  . Abdominal aortic aneurysm ([place])   . Allergy    codeine,pcn,sulfa drugs  . Brain cancer ([place])    mets from lung primary  . Clotted vascular catheter ([place]) 03/30/2012  . COPD (chronic obstructive pulmonary disease) ([place])   . Dyslipidemia   . History of radiation therapy 01/19/12, 01/21/12, 01/26/12   RLL lung  . History of radiation therapy 04/18/11   single fraction to 6 brain mets  . Hypertension   . Lung cancer ([place]) 12/2010  . Lung cancer ([place]) 12/22/2010  . Radiation 10/20/2011   frontal mets/Palliation  . S/P radiation therapy 7/12 thru 9/12, 04/18/11   xrt to brain mets  . Stroke [facility])     Past Surgical History: Past Surgical History:  Procedure Laterality Date  . CARPAL TUNNEL RELEASE    . CHOLECYSTECTOMY    . left knee surgery    . PARTIAL HYSTERECTOMY      Social History:  Social History   Social History  . Marital status: Divorced    Spouse name: N/A  . Number of children: 2  . Years of education: N/A   Occupational History  .  Unemployed    works at [employer]  . Smoking  status: Current Every Day Smoker    Packs/day: 1.00    Years: 50.00    Types: Cigarettes  . Smokeless tobacco: Never Used  . Alcohol use No  . Drug use: No  . Sexual activity: Not Currently   Other Topics Concern  . Not on file   Social History Narrative  . No narrative on file    Family History: Family History  Problem Relation Age of Onset  . Cancer Father     lung, leukemia  . Cancer Maternal Aunt     kidney                    ALLERGIES:  is allergic to sulfa antibiotics; carboplatin; codeine; dilaudid [hydromorphone hcl]; and penicillins.  Meds: Current Outpatient Prescriptions  Medication Sig Dispense Refill  . albuterol (PROVENTIL HFA;VENTOLIN HFA) 108 (90 BASE) MCG/ACT  inhaler Inhale 2 puffs into the lungs every 4 (four) hours as needed for wheezing or shortness of breath. 6.7 g 0  . gabapentin (NEURONTIN) 300 MG capsule Take 2 capsules (600 mg total) by mouth 3 (three) times daily. 180 capsule 6  . ipratropium-albuterol (DUONEB) 0.5-2.5 (3) MG/3ML SOLN Take 3 mLs by nebulization every 6 (six) hours as needed. 360 mL 2  . naproxen (NAPROSYN) 500 MG tablet Take 1 tablet (500 mg total) by mouth 2 (two) times daily with a meal. 20 tablet 0  . traMADol (ULTRAM) 50 MG tablet Take 1 tablet (50 mg total) by mouth every 6 (six) hours as needed. 15 tablet 0  . budesonide-formoterol (SYMBICORT) 80-4.5 MCG/ACT inhaler Inhale 2 puffs into the lungs 2 (two) times daily. 6 g 0   No current facility-administered medications for this encounter.     Physical Findings:  weight is 178 lb (80.7 kg). Her oral temperature is 98.3 F (36.8 C). Her blood pressure is 148/86 (abnormal) and her pulse is 79. Her respiration is 18 and oxygen saturation is 96%.   Pain scale 0/10 In general this is a well-appearing Caucasian female in no acute distress. She is alert and oriented 4 in appropriate examination. Cardiorespiratory assessment reveals normal effort without any acute distress. From a neurologic standpoint she appears to be grossly intact without focal abnormalities.  Lab Findings: Lab Results  Component Value Date   WBC 9.2 06/17/2016   WBC 8.4 08/08/2015   HGB 16.8 (H) 06/17/2016   HCT 49.7 (H) 06/17/2016   PLT 176 06/17/2016    Lab Results  Component Value Date   NA 141 06/17/2016   K 4.0 06/17/2016   CHLORIDE 105 06/17/2016   CO2 28 06/17/2016   GLUCOSE 97 06/17/2016   GLUCOSE 102 (H) 10/06/2012   BUN 16.2 06/17/2016   CREATININE 0.8 06/17/2016   BILITOT 0.42 06/17/2016   ALKPHOS 90 06/17/2016   AST 12 06/17/2016   ALT 13 06/17/2016   PROT 7.3 06/17/2016   ALBUMIN 3.7 06/17/2016   CALCIUM 9.8 06/17/2016   ANIONGAP 8 06/17/2016   ANIONGAP 5 08/08/2015     Radiographic Findings: Ct Chest W Contrast  Result Date: 06/17/2016 CLINICAL DATA:  Restaging lung cancer. EXAM: CT CHEST, ABDOMEN, AND PELVIS WITH CONTRAST TECHNIQUE: Multidetector CT imaging of the chest, abdomen and pelvis was performed following the standard protocol during bolus administration of intravenous contrast. CONTRAST:  125m ISOVUE-300 IOPAMIDOL (ISOVUE-300) INJECTION 61% COMPARISON:  12/12/2015. FINDINGS: CT CHEST FINDINGS Cardiovascular: Right IJ Port-A-Cath terminates in the low SVC. Atherosclerotic calcification of the arterial vasculature, including coronary arteries. Heart size normal.  Large pericardial effusion, similar. Mediastinum/Nodes: Low right paratracheal lymph node measures 1.4 cm, similar. Right hilar lymph nodes measure up to 11 mm, also similar. Left hilar lymphoid tissue. No axillary adenopathy. Esophagus is grossly unremarkable. Lungs/Pleura: Moderate centrilobular very mild paraseptal emphysema. Platelike collapse/ consolidation in the right lower lobe is unchanged. 4 mm left upper lobe nodule (image 39), new. Post treatment volume loss, bronchiectasis and architectural distortion in the medial left hemithorax. Airway is unremarkable. Musculoskeletal: Right seventh and eighth posterior rib fractures with surrounding callus formation, likely posttraumatic. The effects of radiation therapy are also considered. CT ABDOMEN PELVIS FINDINGS Hepatobiliary: Liver is unremarkable. Cholecystectomy. No biliary ductal dilatation. Pancreas: Negative. Spleen: Calcification along the splenic capsule, as before. Adrenals/Urinary Tract: Adrenal glands and kidneys are unremarkable. Ureters are decompressed. Bladder is grossly unremarkable. Stomach/Bowel: Stomach, small bowel, appendix and colon are unremarkable. Vascular/Lymphatic: Atherosclerotic calcification of the arterial vasculature. Infrarenal aortic aneurysm measures 5.0 x 5.5 cm for a length of 6.7 cm. No pathologically enlarged  lymph nodes. Reproductive: Hysterectomy.  No adnexal mass. Other: No free fluid. Mesenteries and peritoneum unremarkable. Small bilateral inguinal hernias contain fat. Musculoskeletal: No worrisome lytic or sclerotic lesions. L2 compression fracture, stable. IMPRESSION: 1. Stable post treatment changes in the hemithoraces bilaterally. 2. 4 mm left upper lobe nodule, new, nonspecific. Attention on followup exams is warranted. 3. Mild mediastinal and right hilar adenopathy, stable. 4. Large pericardial effusion, stable. 5. Infrarenal aortic aneurysm. Recommend followup by abdomen and pelvis CTA in 3-6 months, and vascular surgery referral/consultation if not already obtained. This recommendation follows ACR consensus guidelines: White Paper of the ACR Incidental Findings Committee II on Vascular Findings. J Am Coll Radiol 2013; 10:789-794. 6. Aortic atherosclerosis (ICD10-170.0). Coronary artery calcification. Electronically Signed   By: Lorin Picket M.D.   On: 06/17/2016 13:08   Ct Abdomen Pelvis W Contrast  Result Date: 06/17/2016 CLINICAL DATA:  Restaging lung cancer. EXAM: CT CHEST, ABDOMEN, AND PELVIS WITH CONTRAST TECHNIQUE: Multidetector CT imaging of the chest, abdomen and pelvis was performed following the standard protocol during bolus administration of intravenous contrast. CONTRAST:  171m ISOVUE-300 IOPAMIDOL (ISOVUE-300) INJECTION 61% COMPARISON:  12/12/2015. FINDINGS: CT CHEST FINDINGS Cardiovascular: Right IJ Port-A-Cath terminates in the low SVC. Atherosclerotic calcification of the arterial vasculature, including coronary arteries. Heart size normal. Large pericardial effusion, similar. Mediastinum/Nodes: Low right paratracheal lymph node measures 1.4 cm, similar. Right hilar lymph nodes measure up to 11 mm, also similar. Left hilar lymphoid tissue. No axillary adenopathy. Esophagus is grossly unremarkable. Lungs/Pleura: Moderate centrilobular very mild paraseptal emphysema. Platelike  collapse/ consolidation in the right lower lobe is unchanged. 4 mm left upper lobe nodule (image 39), new. Post treatment volume loss, bronchiectasis and architectural distortion in the medial left hemithorax. Airway is unremarkable. Musculoskeletal: Right seventh and eighth posterior rib fractures with surrounding callus formation, likely posttraumatic. The effects of radiation therapy are also considered. CT ABDOMEN PELVIS FINDINGS Hepatobiliary: Liver is unremarkable. Cholecystectomy. No biliary ductal dilatation. Pancreas: Negative. Spleen: Calcification along the splenic capsule, as before. Adrenals/Urinary Tract: Adrenal glands and kidneys are unremarkable. Ureters are decompressed. Bladder is grossly unremarkable. Stomach/Bowel: Stomach, small bowel, appendix and colon are unremarkable. Vascular/Lymphatic: Atherosclerotic calcification of the arterial vasculature. Infrarenal aortic aneurysm measures 5.0 x 5.5 cm for a length of 6.7 cm. No pathologically enlarged lymph nodes. Reproductive: Hysterectomy.  No adnexal mass. Other: No free fluid. Mesenteries and peritoneum unremarkable. Small bilateral inguinal hernias contain fat. Musculoskeletal: No worrisome lytic or sclerotic lesions. L2 compression fracture, stable. IMPRESSION: 1.  Stable post treatment changes in the hemithoraces bilaterally. 2. 4 mm left upper lobe nodule, new, nonspecific. Attention on followup exams is warranted. 3. Mild mediastinal and right hilar adenopathy, stable. 4. Large pericardial effusion, stable. 5. Infrarenal aortic aneurysm. Recommend followup by abdomen and pelvis CTA in 3-6 months, and vascular surgery referral/consultation if not already obtained. This recommendation follows ACR consensus guidelines: White Paper of the ACR Incidental Findings Committee II on Vascular Findings. J Am Coll Radiol 2013; 10:789-794. 6. Aortic atherosclerosis (ICD10-170.0). Coronary artery calcification. Electronically Signed   By: Lorin Picket  M.D.   On: 06/17/2016 13:08    Impression/Plan:   1. S/P SRS to the brain and SBRT to the right lung for metastatic lung cancer  The patient appears to be clinically doing well. She denies any new symptoms that are of concern. We will continue to follow her expectantly at six-month intervals. As she is previously requested we will not E MRI however if she changes her mind or if she becomes clinically symptomatic, we would recommend additional workup. She states understanding of this.   The above documentation reflects my direct findings during this shared patient visit. Please see the separate note by Dr. Tammi Klippel on this date for the remainder of the patient's plan of care.  Carola Rhine, PAC  And    Tyler Pita, MD [place] Director and Director of Stereotactic Radiosurgery Direct Dial: [phone]  Fax: [phone] [email or web address]  Skype  LinkedIn      This document serves as a record of services personally performed by Shona Simpson, PA-C and Tyler Pita, MD. It was created on their behalf by Bethann Humble, a trained medical scribe. The creation of this record is based on the scribe's personal observations and the provider's statements to them. This document has been checked and approved by the attending provider.

## 2016-08-18 ENCOUNTER — Encounter (HOSPITAL_COMMUNITY): Payer: Self-pay | Admitting: Emergency Medicine

## 2016-08-18 ENCOUNTER — Emergency Department (HOSPITAL_COMMUNITY)
Admission: EM | Admit: 2016-08-18 | Discharge: 2016-08-18 | Disposition: A | Discharge status: Home / Self Care | Payer: Medicare Other | Attending: Emergency Medicine | Admitting: Emergency Medicine

## 2016-08-18 ENCOUNTER — Emergency Department (HOSPITAL_COMMUNITY): Payer: Medicare Other

## 2016-08-18 DIAGNOSIS — J449 Chronic obstructive pulmonary disease, unspecified: Secondary | ICD-10-CM | POA: Insufficient documentation

## 2016-08-18 DIAGNOSIS — M25531 Pain in right wrist: Secondary | ICD-10-CM | POA: Insufficient documentation

## 2016-08-18 DIAGNOSIS — I10 Essential (primary) hypertension: Secondary | ICD-10-CM | POA: Diagnosis not present

## 2016-08-18 DIAGNOSIS — Z85118 Personal history of other malignant neoplasm of bronchus and lung: Secondary | ICD-10-CM | POA: Insufficient documentation

## 2016-08-18 DIAGNOSIS — W010XXA Fall on same level from slipping, tripping and stumbling without subsequent striking against object, initial encounter: Secondary | ICD-10-CM | POA: Diagnosis not present

## 2016-08-18 DIAGNOSIS — W19XXXA Unspecified fall, initial encounter: Secondary | ICD-10-CM

## 2016-08-18 DIAGNOSIS — Z79899 Other long term (current) drug therapy: Secondary | ICD-10-CM | POA: Diagnosis not present

## 2016-08-18 DIAGNOSIS — Y939 Activity, unspecified: Secondary | ICD-10-CM | POA: Insufficient documentation

## 2016-08-18 DIAGNOSIS — F17210 Nicotine dependence, cigarettes, uncomplicated: Secondary | ICD-10-CM | POA: Diagnosis not present

## 2016-08-18 DIAGNOSIS — Y92008 Other place in unspecified non-institutional (private) residence as the place of occurrence of the external cause: Secondary | ICD-10-CM | POA: Diagnosis not present

## 2016-08-18 DIAGNOSIS — Y999 Unspecified external cause status: Secondary | ICD-10-CM | POA: Diagnosis not present

## 2016-08-18 DIAGNOSIS — S50311A Abrasion of right elbow, initial encounter: Secondary | ICD-10-CM

## 2016-08-18 DIAGNOSIS — Z85841 Personal history of malignant neoplasm of brain: Secondary | ICD-10-CM | POA: Diagnosis not present

## 2016-08-18 DIAGNOSIS — S59901A Unspecified injury of right elbow, initial encounter: Secondary | ICD-10-CM | POA: Diagnosis present

## 2016-08-18 MED ORDER — BACITRACIN ZINC 500 UNIT/GM EX OINT
TOPICAL_OINTMENT | CUTANEOUS | Status: AC
  Administered 2016-08-18: 21:00:00
  Filled 2016-08-18: qty 0.9

## 2016-08-18 NOTE — ED Triage Notes (Signed)
Slipped an fell on front porch on some water.  Pain to rt wrist.

## 2016-08-18 NOTE — ED Provider Notes (Addendum)
[facility] DEPT Provider Note   CSN: [id number] Arrival date & time: 08/18/16  8850  By signing my name below, I, Neta Mends, attest that this documentation has been prepared under the direction and in the presence of Etta Quill, NP. Electronically Signed: Neta Mends, ED Scribe. 08/18/2016. 7:53 PM.    History   Chief Complaint Chief Complaint  Patient presents with  . Fall    The history is provided by the patient. No language interpreter was used.   HPI Comments:  Courtney West is a 66 y.o. female who presents to the Emergency Department s/p fall that occurred PTA. Pt reports that she slipped on some water on her front porch and braced her fall with her right hand. She complains of constant pain to the right wrist. Pt denies head trauma/LOC. Tetanus is UTD. No alleviating factors noted. Pt denies other associated symptoms.   Past Medical History:  Diagnosis Date  . Abdominal aortic aneurysm ([place])   . Allergy    codeine,pcn,sulfa drugs  . Brain cancer ([place])    mets from lung primary  . Clotted vascular catheter ([place]) 03/30/2012  . COPD (chronic obstructive pulmonary disease) ([place])   . Dyslipidemia   . History of radiation therapy 01/19/12, 01/21/12, 01/26/12   RLL lung  . History of radiation therapy 04/18/11   single fraction to 6 brain mets  . Hypertension   . Lung cancer ([place]) 12/2010  . Lung cancer ([place]) 12/22/2010  . Radiation 10/20/2011   frontal mets/Palliation  . S/P radiation therapy 7/12 thru 9/12, 04/18/11   xrt to brain mets  . Stroke [facility]     Patient Active Problem List   Diagnosis Date Noted  . COPD with exacerbation ([place]) 08/09/2015  . Acute respiratory failure with hypoxia ([place])   . CAP (community acquired pneumonia) 03/26/2015  . 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 Hypertension 03/26/2015  . Dyslipidemia 03/26/2015  . Leukocytosis 03/26/2015  . COPD exacerbation ([place]) 03/26/2015  . Malnutrition of moderate degree 03/26/2015  .  DVT (deep venous thrombosis) ([place]) 04/01/2012  . Clotted vascular catheter ([place]) 03/30/2012  . History of radiation therapy   . COPD (chronic obstructive pulmonary disease) ([place])   . Stroke ([place])   . Radiation 10/20/2011  . Brain Metastases   . Bronchogenic cancer of right lung ([place]) 05/10/2011    Past Surgical History:  Procedure Laterality Date  . CARPAL TUNNEL RELEASE    . CHOLECYSTECTOMY    . left knee surgery    . PARTIAL HYSTERECTOMY      OB History    No data available       Home Medications    Prior to Admission medications   Medication Sig Start Date End Date Taking? Authorizing Provider  albuterol (PROVENTIL HFA;VENTOLIN HFA) 108 (90 BASE) MCG/ACT inhaler Inhale 2 puffs into the lungs every 4 (four) hours as needed for wheezing or shortness of breath. 12/20/14   Tyler Pita, MD  budesonide-formoterol [facility]) 80-4.5 MCG/ACT inhaler Inhale 2 puffs into the lungs 2 (two) times daily. 02/02/16 03/03/16  Tyler Pita, MD  gabapentin (NEURONTIN) 300 MG capsule Take 2 capsules (600 mg total) by mouth 3 (three) times daily. 11/27/15   Hayden Pedro, PA-C  ipratropium-albuterol (DUONEB) 0.5-2.5 (3) MG/3ML SOLN Take 3 mLs by nebulization every 6 (six) hours as needed. 03/28/15   Oswald Hillock, MD  naproxen (NAPROSYN) 500 MG tablet Take 1 tablet (500 mg total) by mouth 2 (two) times daily with  a meal. 02/22/16   Tammy Triplett, PA-C  traMADol (ULTRAM) 50 MG tablet Take 1 tablet (50 mg total) by mouth every 6 (six) hours as needed. 02/22/16   Tammy Triplett, PA-C    Family History Family History  Problem Relation Age of Onset  . Cancer Father     lung, leukemia  . Cancer Maternal Aunt     kidney    Social History Social History  Substance Use Topics  . Smoking status: Current Every Day Smoker    Packs/day: 1.00    Years: 50.00    Types: Cigarettes  . Smokeless tobacco: Never Used  . Alcohol use No     Allergies   Sulfa antibiotics; Carboplatin; Codeine;  Dilaudid [hydromorphone hcl]; and Penicillins   Review of Systems Review of Systems  Musculoskeletal: Positive for arthralgias.  Neurological: Negative for syncope.  All other systems reviewed and are negative.    Physical Exam Updated Vital Signs BP 164/81 (BP Location: Right Arm)   Pulse 80   Temp 98.4 F (36.9 C) (Oral)   Resp 18   Ht '5\' 2"'$  (1.575 m)   Wt 178 lb (80.7 kg)   SpO2 98%   BMI 32.56 kg/m   Physical Exam  Constitutional: She appears well-developed and well-nourished. No distress.  HENT:  Head: Normocephalic and atraumatic.  Eyes: Conjunctivae are normal.  Cardiovascular: Normal rate.   Pulmonary/Chest: Effort normal.  Abdominal: She exhibits no distension.  Musculoskeletal:  Superficial abrasion to right elbow, good ROM, strong pulses  Neurological: She is alert.  Skin: Skin is warm and dry. Capillary refill takes less than 2 seconds.  Psychiatric: She has a normal mood and affect.  Nursing note and vitals reviewed.    ED Treatments / Results  DIAGNOSTIC STUDIES:  Oxygen Saturation is 98% on RA, normal by my interpretation.    COORDINATION OF CARE:  7:53 PM Discussed treatment plan with pt at bedside and pt agreed to plan.   Labs (all labs ordered are listed, but only abnormal results are displayed) Labs Reviewed - No data to display  EKG  EKG Interpretation None       Radiology Dg Wrist Complete Right  Result Date: 08/18/2016 CLINICAL DATA:  Fall, wrist pain EXAM: RIGHT WRIST - COMPLETE 3+ VIEW COMPARISON:  None. FINDINGS: There is no evidence of fracture or dislocation. There is no evidence of arthropathy or other focal bone abnormality. Soft tissues are unremarkable. IMPRESSION: No acute osseous injury of the right wrist. Electronically Signed   By: Kathreen Devoid   On: 08/18/2016 20:19    Procedures Procedures (including critical care time)  Medications Ordered in ED Medications - No data to display   Initial Impression /  Assessment and Plan / ED Course  I have reviewed the triage vital signs and the nursing notes.  Pertinent labs & imaging results that were available during my care of the patient were reviewed by me and considered in my medical decision making (see chart for details).     Patient X-Ray negative for obvious fracture or dislocation.  Pt advised to follow up with orthopedics. Patient given wrist splint while in ED, conservative therapy recommended and discussed. Patient will be discharged home & is agreeable with above plan. Returns precautions discussed. Pt appears safe for discharge.  Final Clinical Impressions(s) / ED Diagnoses   Final diagnoses:  Fall, initial encounter  Right wrist pain  Abrasion of elbow, right, initial encounter    New Prescriptions New Prescriptions  No medications on file  I personally performed the services described in this documentation, which was scribed in my presence. The recorded information has been reviewed and is accurate.     Etta Quill, NP 08/18/16 [id number]    Daleen Bo, MD 08/18/16 2333  Splint applied by orthopedic tech.   Etta Quill, NP 08/25/16 [place], MD 08/27/16 1336

## 2016-08-18 NOTE — ED Notes (Signed)
Pt ambulatory to waiting room. Pt verbalized understanding of discharge instructions.   

## 2016-09-03 ENCOUNTER — Telehealth: Payer: Self-pay | Admitting: Radiation Oncology

## 2016-09-03 DIAGNOSIS — G609 Hereditary and idiopathic neuropathy, unspecified: Secondary | ICD-10-CM

## 2016-09-03 MED ORDER — GABAPENTIN 300 MG PO CAPS: 600.0000 mg | ORAL_CAPSULE | Freq: Three times a day (TID) | ORAL | 6 refills | Status: DC

## 2016-09-03 NOTE — Telephone Encounter (Signed)
Phoned prescription into [facility] in [place] per Dr. Johny Shears order. Then, phoned patient making her aware this was done. Instructed patient to follow up with PCP in April about trouble sleeping. Patient verbalized understanding of all discussed.

## 2016-09-03 NOTE — Telephone Encounter (Signed)
-----   Message from Tyler Pita, MD sent at 09/03/2016 10:52 AM EDT ----- Regarding: RE: Medication refill request Contact: [phone] We can refill gabapentin, same instructions.  ----- Message ----- From: Heywood Footman, RN Sent: 09/02/2016   2:31 PM To: Tyler Pita, MD, # Subject: Medication refill request                      Patient phoned today requesting a refill of her gabapentin and "something to help her sleep." I gently reminded her that we requested she establish a PCP to manage future refills. She reports that she is seeing Dr. Glendale Chard but, doesn't really care for her. Explained she can establish care with another provider without a referral but, she would need to call around. She verbalized understanding. She reports she is scheduled to follow up with Dr. Tye Savoy in April but, only has a few pills left. Pharmacy preference is [facility], [place]. She understands I will call her tomorrow with your response.   [name]

## 2016-09-21 DIAGNOSIS — C3490 Malignant neoplasm of unspecified part of unspecified bronchus or lung: Secondary | ICD-10-CM | POA: Diagnosis not present

## 2016-09-30 DIAGNOSIS — E559 Vitamin D deficiency, unspecified: Secondary | ICD-10-CM | POA: Diagnosis not present

## 2016-09-30 DIAGNOSIS — Z0001 Encounter for general adult medical examination with abnormal findings: Secondary | ICD-10-CM | POA: Diagnosis not present

## 2016-09-30 DIAGNOSIS — Z79899 Other long term (current) drug therapy: Secondary | ICD-10-CM | POA: Diagnosis not present

## 2016-09-30 DIAGNOSIS — M85852 Other specified disorders of bone density and structure, left thigh: Secondary | ICD-10-CM | POA: Diagnosis not present

## 2016-09-30 DIAGNOSIS — J449 Chronic obstructive pulmonary disease, unspecified: Secondary | ICD-10-CM | POA: Diagnosis not present

## 2016-09-30 DIAGNOSIS — M25511 Pain in right shoulder: Secondary | ICD-10-CM | POA: Diagnosis not present

## 2016-10-09 ENCOUNTER — Ambulatory Visit (INDEPENDENT_AMBULATORY_CARE_PROVIDER_SITE_OTHER): Payer: Medicare Other | Admitting: Orthopaedic Surgery

## 2016-10-09 ENCOUNTER — Encounter: Payer: Self-pay | Admitting: Orthopaedic Surgery

## 2016-10-09 ENCOUNTER — Ambulatory Visit (INDEPENDENT_AMBULATORY_CARE_PROVIDER_SITE_OTHER): Payer: Medicare Other

## 2016-10-09 VITALS — BP 138/87 | HR 84 | Temp 97.7°F | Ht 64.0 in | Wt 173.0 lb

## 2016-10-09 DIAGNOSIS — M542 Cervicalgia: Secondary | ICD-10-CM

## 2016-10-09 DIAGNOSIS — Z01812 Encounter for preprocedural laboratory examination: Secondary | ICD-10-CM | POA: Diagnosis not present

## 2016-10-09 DIAGNOSIS — C7949 Secondary malignant neoplasm of other parts of nervous system: Secondary | ICD-10-CM | POA: Diagnosis not present

## 2016-10-09 DIAGNOSIS — C3491 Malignant neoplasm of unspecified part of right bronchus or lung: Secondary | ICD-10-CM | POA: Diagnosis not present

## 2016-10-09 DIAGNOSIS — F17210 Nicotine dependence, cigarettes, uncomplicated: Secondary | ICD-10-CM

## 2016-10-09 DIAGNOSIS — C7931 Secondary malignant neoplasm of brain: Secondary | ICD-10-CM

## 2016-10-09 NOTE — Progress Notes (Signed)
Subjective:    Patient ID: Courtney West, female    DOB: 06-19-1951, 66 y.o.   MRN: [id number]  HPI She has had neck pain, right upper shoulder pain, pain running down her arm on the right to the right thumb for about six to eight weeks getting gradually worse. She has no trauma.  Nothing seems to help it.  She has tried ice, heat, rubs, Advil.  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  She underwent chemotherapy and radiation therapy for some time.  She has been told she is doing well now but has yearly check-ups.  I am concerned about a possible HNP of the neck vs a metastasis to the neck area.  I will order a MRI scan with contrast.  The patient is very agreeable to this.  She smokes and continues to smoke despite her diagnosis in the past.  Review of Systems  HENT: Negative for congestion.   Respiratory: Positive for shortness of breath. Negative for cough.   Endocrine: Positive for cold intolerance.  Musculoskeletal: Positive for arthralgias, neck pain and neck stiffness.  Allergic/Immunologic: Positive for environmental allergies.   Past Medical History:  Diagnosis Date  . Abdominal aortic aneurysm ([place])   . Allergy    codeine,pcn,sulfa drugs  . Brain cancer ([place])    mets from lung primary  . Clotted vascular catheter ([place]) 03/30/2012  . COPD (chronic obstructive pulmonary disease) ([place])   . Dyslipidemia   . History of radiation therapy 01/19/12, 01/21/12, 01/26/12   RLL lung  . History of radiation therapy 04/18/11   single fraction to 6 brain mets  . Hypertension   . Lung cancer ([place]) 12/2010  . Lung cancer ([place]) 12/22/2010  . Radiation 10/20/2011   frontal mets/Palliation  . S/P radiation therapy 7/12 thru 9/12, 04/18/11   xrt to brain mets  . Stroke [facility])     Past Surgical History:  Procedure Laterality Date  . CARPAL TUNNEL RELEASE    . CHOLECYSTECTOMY    . left knee surgery    . PARTIAL HYSTERECTOMY      Current Outpatient  Prescriptions on File Prior to Visit  Medication Sig Dispense Refill  . albuterol (PROVENTIL HFA;VENTOLIN HFA) 108 (90 BASE) MCG/ACT inhaler Inhale 2 puffs into the lungs every 4 (four) hours as needed for wheezing or shortness of breath. 6.7 g 0  . budesonide-formoterol (SYMBICORT) 80-4.5 MCG/ACT inhaler Inhale 2 puffs into the lungs 2 (two) times daily. 6 g 0  . gabapentin (NEURONTIN) 300 MG capsule Take 2 capsules (600 mg total) by mouth 3 (three) times daily. 180 capsule 6  . ipratropium-albuterol (DUONEB) 0.5-2.5 (3) MG/3ML SOLN Take 3 mLs by nebulization every 6 (six) hours as needed. 360 mL 2  . naproxen (NAPROSYN) 500 MG tablet Take 1 tablet (500 mg total) by mouth 2 (two) times daily with a meal. 20 tablet 0  . traMADol (ULTRAM) 50 MG tablet Take 1 tablet (50 mg total) by mouth every 6 (six) hours as needed. 15 tablet 0   No current facility-administered medications on file prior to visit.     Social History   Social History  . Marital status: Divorced    Spouse name: N/A  . Number of children: 2  . Years of education: N/A   Occupational History  .  Unemployed    works at [employer]  . Smoking status: Current Every Day Smoker  Packs/day: 1.00    Years: 50.00    Types: Cigarettes  . Smokeless tobacco: Never Used  . Alcohol use No  . Drug use: No  . Sexual activity: Not Currently   Other Topics Concern  . Not on file   Social History Narrative  . No narrative on file    Family History  Problem Relation Age of Onset  . Cancer Father     lung, leukemia  . Cancer Maternal Aunt     kidney    BP 138/87   Pulse 84   Temp 97.7 F (36.5 C)   Ht '5\' 4"'$  (1.626 m)   Wt 173 lb (78.5 kg)   BMI 29.70 kg/m       Objective:   Physical Exam  Constitutional: She is oriented to person, place, and time. She appears well-developed and well-nourished.  HENT:  Head: Normocephalic and atraumatic.  Eyes: Conjunctivae and EOM are  normal. Pupils are equal, round, and reactive to light.  Neck: Normal range of motion. Neck supple.  Cardiovascular: Normal rate, regular rhythm and intact distal pulses.   Pulmonary/Chest: Effort normal.  Abdominal: Soft.  Musculoskeletal: She exhibits tenderness (Neck not tender, full ROM, pain upper right trapezius, slight weakness of right triceps, decreased sensation over dorsal thumb, ROM hand good, grip good.  No pain left shoulder or arm.).  Neurological: She is alert and oriented to person, place, and time. She displays normal reflexes. No cranial nerve deficit. She exhibits normal muscle tone. Coordination normal.  Skin: Skin is warm and dry.  Psychiatric: She has a normal mood and affect. Her behavior is normal. Judgment and thought content normal.  Vitals reviewed.   X-rays were done of the cervical spine, reported separately.      Assessment & Plan:   Encounter Diagnoses  Name Primary?  . Neck pain Yes  . Bronchogenic cancer of right lung ([place])   . Brain Metastases   . Pre-procedure lab exam   . Cigarette nicotine dependence without complication    I will get MRI of the cervical spine.  Return after MRI.  Call if any problem.  Precautions discussed.  Electronically Signed Sanjuana Kava, MD 4/19/20183:59 PM

## 2016-10-13 ENCOUNTER — Encounter (HOSPITAL_COMMUNITY): Payer: Self-pay

## 2016-10-13 ENCOUNTER — Emergency Department (HOSPITAL_COMMUNITY): Payer: No Typology Code available for payment source

## 2016-10-13 ENCOUNTER — Inpatient Hospital Stay (HOSPITAL_COMMUNITY)
Admission: EM | Admit: 2016-10-13 | Discharge: 2016-10-15 | DRG: 190 | Disposition: A | Discharge status: Home / Self Care | Payer: No Typology Code available for payment source | Attending: Internal Medicine | Admitting: Internal Medicine

## 2016-10-13 ENCOUNTER — Ambulatory Visit (HOSPITAL_COMMUNITY): Payer: Medicare Other

## 2016-10-13 DIAGNOSIS — R55 Syncope and collapse: Secondary | ICD-10-CM | POA: Diagnosis not present

## 2016-10-13 DIAGNOSIS — Z634 Disappearance and death of family member: Secondary | ICD-10-CM

## 2016-10-13 DIAGNOSIS — Z79891 Long term (current) use of opiate analgesic: Secondary | ICD-10-CM

## 2016-10-13 DIAGNOSIS — I10 Essential (primary) hypertension: Secondary | ICD-10-CM | POA: Diagnosis not present

## 2016-10-13 DIAGNOSIS — Z79899 Other long term (current) drug therapy: Secondary | ICD-10-CM

## 2016-10-13 DIAGNOSIS — Z8673 Personal history of transient ischemic attack (TIA), and cerebral infarction without residual deficits: Secondary | ICD-10-CM

## 2016-10-13 DIAGNOSIS — Z9221 Personal history of antineoplastic chemotherapy: Secondary | ICD-10-CM | POA: Diagnosis not present

## 2016-10-13 DIAGNOSIS — D751 Secondary polycythemia: Secondary | ICD-10-CM

## 2016-10-13 DIAGNOSIS — I714 Abdominal aortic aneurysm, without rupture: Secondary | ICD-10-CM | POA: Diagnosis not present

## 2016-10-13 DIAGNOSIS — J9621 Acute and chronic respiratory failure with hypoxia: Secondary | ICD-10-CM | POA: Diagnosis not present

## 2016-10-13 DIAGNOSIS — D72829 Elevated white blood cell count, unspecified: Secondary | ICD-10-CM | POA: Diagnosis not present

## 2016-10-13 DIAGNOSIS — Z880 Allergy status to penicillin: Secondary | ICD-10-CM | POA: Diagnosis not present

## 2016-10-13 DIAGNOSIS — Z9119 Patient's noncompliance with other medical treatment and regimen: Secondary | ICD-10-CM | POA: Diagnosis not present

## 2016-10-13 DIAGNOSIS — R410 Disorientation, unspecified: Secondary | ICD-10-CM | POA: Diagnosis not present

## 2016-10-13 DIAGNOSIS — S70922A Unspecified superficial injury of left thigh, initial encounter: Secondary | ICD-10-CM | POA: Diagnosis not present

## 2016-10-13 DIAGNOSIS — I313 Pericardial effusion (noninflammatory): Secondary | ICD-10-CM | POA: Diagnosis not present

## 2016-10-13 DIAGNOSIS — S3992XA Unspecified injury of lower back, initial encounter: Secondary | ICD-10-CM | POA: Diagnosis not present

## 2016-10-13 DIAGNOSIS — Y92410 Unspecified street and highway as the place of occurrence of the external cause: Secondary | ICD-10-CM

## 2016-10-13 DIAGNOSIS — Z806 Family history of leukemia: Secondary | ICD-10-CM

## 2016-10-13 DIAGNOSIS — J9601 Acute respiratory failure with hypoxia: Secondary | ICD-10-CM

## 2016-10-13 DIAGNOSIS — S299XXA Unspecified injury of thorax, initial encounter: Secondary | ICD-10-CM | POA: Diagnosis not present

## 2016-10-13 DIAGNOSIS — Z7951 Long term (current) use of inhaled steroids: Secondary | ICD-10-CM | POA: Diagnosis not present

## 2016-10-13 DIAGNOSIS — S199XXA Unspecified injury of neck, initial encounter: Secondary | ICD-10-CM | POA: Diagnosis not present

## 2016-10-13 DIAGNOSIS — M549 Dorsalgia, unspecified: Secondary | ICD-10-CM

## 2016-10-13 DIAGNOSIS — C7931 Secondary malignant neoplasm of brain: Secondary | ICD-10-CM | POA: Diagnosis not present

## 2016-10-13 DIAGNOSIS — Z720 Tobacco use: Secondary | ICD-10-CM | POA: Diagnosis present

## 2016-10-13 DIAGNOSIS — J449 Chronic obstructive pulmonary disease, unspecified: Secondary | ICD-10-CM | POA: Diagnosis not present

## 2016-10-13 DIAGNOSIS — I7143 Infrarenal abdominal aortic aneurysm, without rupture: Secondary | ICD-10-CM | POA: Diagnosis present

## 2016-10-13 DIAGNOSIS — F17210 Nicotine dependence, cigarettes, uncomplicated: Secondary | ICD-10-CM | POA: Diagnosis present

## 2016-10-13 DIAGNOSIS — E785 Hyperlipidemia, unspecified: Secondary | ICD-10-CM | POA: Diagnosis present

## 2016-10-13 DIAGNOSIS — Z8051 Family history of malignant neoplasm of kidney: Secondary | ICD-10-CM

## 2016-10-13 DIAGNOSIS — E669 Obesity, unspecified: Secondary | ICD-10-CM | POA: Diagnosis present

## 2016-10-13 DIAGNOSIS — Z888 Allergy status to other drugs, medicaments and biological substances status: Secondary | ICD-10-CM

## 2016-10-13 DIAGNOSIS — K469 Unspecified abdominal hernia without obstruction or gangrene: Secondary | ICD-10-CM | POA: Diagnosis present

## 2016-10-13 DIAGNOSIS — Z9049 Acquired absence of other specified parts of digestive tract: Secondary | ICD-10-CM

## 2016-10-13 DIAGNOSIS — M545 Low back pain: Secondary | ICD-10-CM | POA: Diagnosis not present

## 2016-10-13 DIAGNOSIS — Z85118 Personal history of other malignant neoplasm of bronchus and lung: Secondary | ICD-10-CM

## 2016-10-13 DIAGNOSIS — J441 Chronic obstructive pulmonary disease with (acute) exacerbation: Secondary | ICD-10-CM | POA: Diagnosis not present

## 2016-10-13 DIAGNOSIS — S3993XA Unspecified injury of pelvis, initial encounter: Secondary | ICD-10-CM | POA: Diagnosis not present

## 2016-10-13 DIAGNOSIS — M542 Cervicalgia: Secondary | ICD-10-CM | POA: Diagnosis not present

## 2016-10-13 DIAGNOSIS — Z885 Allergy status to narcotic agent status: Secondary | ICD-10-CM

## 2016-10-13 DIAGNOSIS — Z923 Personal history of irradiation: Secondary | ICD-10-CM | POA: Diagnosis not present

## 2016-10-13 DIAGNOSIS — Z6829 Body mass index (BMI) 29.0-29.9, adult: Secondary | ICD-10-CM

## 2016-10-13 DIAGNOSIS — R911 Solitary pulmonary nodule: Secondary | ICD-10-CM | POA: Diagnosis not present

## 2016-10-13 DIAGNOSIS — M79652 Pain in left thigh: Secondary | ICD-10-CM | POA: Diagnosis not present

## 2016-10-13 DIAGNOSIS — Z86718 Personal history of other venous thrombosis and embolism: Secondary | ICD-10-CM

## 2016-10-13 DIAGNOSIS — Z8701 Personal history of pneumonia (recurrent): Secondary | ICD-10-CM

## 2016-10-13 DIAGNOSIS — Z801 Family history of malignant neoplasm of trachea, bronchus and lung: Secondary | ICD-10-CM | POA: Diagnosis not present

## 2016-10-13 DIAGNOSIS — R51 Headache: Secondary | ICD-10-CM | POA: Diagnosis not present

## 2016-10-13 DIAGNOSIS — Z90710 Acquired absence of both cervix and uterus: Secondary | ICD-10-CM

## 2016-10-13 LAB — CBC WITH DIFFERENTIAL/PLATELET
Basophils Absolute: 0 10*3/uL (ref 0.0–0.1)
Basophils Relative: 0 %
EOS ABS: 0.1 10*3/uL (ref 0.0–0.7)
EOS PCT: 1 %
HCT: 47.4 % — ABNORMAL HIGH (ref 36.0–46.0)
Hemoglobin: 16 g/dL — ABNORMAL HIGH (ref 12.0–15.0)
LYMPHS ABS: 2.4 10*3/uL (ref 0.7–4.0)
Lymphocytes Relative: 17 %
MCH: 31.4 pg (ref 26.0–34.0)
MCHC: 33.8 g/dL (ref 30.0–36.0)
MCV: 93.1 fL (ref 78.0–100.0)
MONO ABS: 0.6 10*3/uL (ref 0.1–1.0)
Monocytes Relative: 4 %
Neutro Abs: 11 10*3/uL — ABNORMAL HIGH (ref 1.7–7.7)
Neutrophils Relative %: 78 %
PLATELETS: 166 10*3/uL (ref 150–400)
RBC: 5.09 MIL/uL (ref 3.87–5.11)
RDW: 13 % (ref 11.5–15.5)
WBC: 14.1 10*3/uL — AB (ref 4.0–10.5)

## 2016-10-13 LAB — COMPREHENSIVE METABOLIC PANEL
ALT: 20 U/L (ref 14–54)
ANION GAP: 7 (ref 5–15)
AST: 30 U/L (ref 15–41)
Albumin: 3.7 g/dL (ref 3.5–5.0)
Alkaline Phosphatase: 71 U/L (ref 38–126)
BUN: 11 mg/dL (ref 6–20)
CALCIUM: 9.3 mg/dL (ref 8.9–10.3)
CHLORIDE: 106 mmol/L (ref 101–111)
CO2: 26 mmol/L (ref 22–32)
Creatinine, Ser: 0.82 mg/dL (ref 0.44–1.00)
GLUCOSE: 93 mg/dL (ref 65–99)
POTASSIUM: 4.5 mmol/L (ref 3.5–5.1)
SODIUM: 139 mmol/L (ref 135–145)
TOTAL PROTEIN: 6.9 g/dL (ref 6.5–8.1)
Total Bilirubin: 0.9 mg/dL (ref 0.3–1.2)

## 2016-10-13 LAB — I-STAT TROPONIN, ED: TROPONIN I, POC: 0 ng/mL (ref 0.00–0.08)

## 2016-10-13 MED ORDER — FENTANYL CITRATE (PF) 100 MCG/2ML IJ SOLN
50.0000 ug | Freq: Once | INTRAMUSCULAR | Status: AC
  Administered 2016-10-13: 50 ug via INTRAVENOUS
  Filled 2016-10-13: qty 2

## 2016-10-13 MED ORDER — IPRATROPIUM-ALBUTEROL 0.5-2.5 (3) MG/3ML IN SOLN
3.0000 mL | Freq: Once | RESPIRATORY_TRACT | Status: AC
  Administered 2016-10-13: 3 mL via RESPIRATORY_TRACT
  Filled 2016-10-13: qty 3

## 2016-10-13 MED ORDER — IOPAMIDOL (ISOVUE-300) INJECTION 61%
INTRAVENOUS | Status: AC
  Administered 2016-10-13: 100 mL
  Filled 2016-10-13: qty 100

## 2016-10-13 MED ORDER — IOPAMIDOL (ISOVUE-300) INJECTION 61%
INTRAVENOUS | Status: AC
  Filled 2016-10-13: qty 75

## 2016-10-13 MED ORDER — METHYLPREDNISOLONE SODIUM SUCC 125 MG IJ SOLR
125.0000 mg | Freq: Once | INTRAMUSCULAR | Status: AC
  Administered 2016-10-13: 125 mg via INTRAVENOUS
  Filled 2016-10-13: qty 2
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
## 2016-10-13 NOTE — ED Notes (Addendum)
Admitting Provider at bedside. 

## 2016-10-13 NOTE — ED Notes (Signed)
Pt still in CT

## 2016-10-13 NOTE — ED Notes (Signed)
Patient transported to X-ray 

## 2016-10-14 ENCOUNTER — Inpatient Hospital Stay (HOSPITAL_COMMUNITY): Payer: No Typology Code available for payment source

## 2016-10-14 DIAGNOSIS — Z923 Personal history of irradiation: Secondary | ICD-10-CM | POA: Diagnosis not present

## 2016-10-14 DIAGNOSIS — E785 Hyperlipidemia, unspecified: Secondary | ICD-10-CM | POA: Diagnosis present

## 2016-10-14 DIAGNOSIS — C7931 Secondary malignant neoplasm of brain: Secondary | ICD-10-CM | POA: Diagnosis present

## 2016-10-14 DIAGNOSIS — R410 Disorientation, unspecified: Secondary | ICD-10-CM | POA: Diagnosis not present

## 2016-10-14 DIAGNOSIS — Z888 Allergy status to other drugs, medicaments and biological substances status: Secondary | ICD-10-CM | POA: Diagnosis not present

## 2016-10-14 DIAGNOSIS — R55 Syncope and collapse: Secondary | ICD-10-CM

## 2016-10-14 DIAGNOSIS — J441 Chronic obstructive pulmonary disease with (acute) exacerbation: Secondary | ICD-10-CM | POA: Diagnosis not present

## 2016-10-14 DIAGNOSIS — Z880 Allergy status to penicillin: Secondary | ICD-10-CM | POA: Diagnosis not present

## 2016-10-14 DIAGNOSIS — J9601 Acute respiratory failure with hypoxia: Secondary | ICD-10-CM | POA: Diagnosis not present

## 2016-10-14 DIAGNOSIS — Z806 Family history of leukemia: Secondary | ICD-10-CM | POA: Diagnosis not present

## 2016-10-14 DIAGNOSIS — F17210 Nicotine dependence, cigarettes, uncomplicated: Secondary | ICD-10-CM | POA: Diagnosis present

## 2016-10-14 DIAGNOSIS — C3490 Malignant neoplasm of unspecified part of unspecified bronchus or lung: Secondary | ICD-10-CM | POA: Diagnosis not present

## 2016-10-14 DIAGNOSIS — Z885 Allergy status to narcotic agent status: Secondary | ICD-10-CM | POA: Diagnosis not present

## 2016-10-14 DIAGNOSIS — M549 Dorsalgia, unspecified: Secondary | ICD-10-CM | POA: Diagnosis present

## 2016-10-14 DIAGNOSIS — D751 Secondary polycythemia: Secondary | ICD-10-CM | POA: Diagnosis present

## 2016-10-14 DIAGNOSIS — J9621 Acute and chronic respiratory failure with hypoxia: Secondary | ICD-10-CM | POA: Diagnosis present

## 2016-10-14 DIAGNOSIS — Z720 Tobacco use: Secondary | ICD-10-CM | POA: Diagnosis present

## 2016-10-14 DIAGNOSIS — I714 Abdominal aortic aneurysm, without rupture: Secondary | ICD-10-CM | POA: Diagnosis not present

## 2016-10-14 DIAGNOSIS — C716 Malignant neoplasm of cerebellum: Secondary | ICD-10-CM | POA: Diagnosis not present

## 2016-10-14 DIAGNOSIS — Y92410 Unspecified street and highway as the place of occurrence of the external cause: Secondary | ICD-10-CM | POA: Diagnosis not present

## 2016-10-14 DIAGNOSIS — Z9119 Patient's noncompliance with other medical treatment and regimen: Secondary | ICD-10-CM | POA: Diagnosis not present

## 2016-10-14 DIAGNOSIS — Z9221 Personal history of antineoplastic chemotherapy: Secondary | ICD-10-CM | POA: Diagnosis not present

## 2016-10-14 DIAGNOSIS — I10 Essential (primary) hypertension: Secondary | ICD-10-CM | POA: Diagnosis present

## 2016-10-14 DIAGNOSIS — Z8673 Personal history of transient ischemic attack (TIA), and cerebral infarction without residual deficits: Secondary | ICD-10-CM | POA: Diagnosis not present

## 2016-10-14 DIAGNOSIS — I313 Pericardial effusion (noninflammatory): Secondary | ICD-10-CM | POA: Diagnosis present

## 2016-10-14 DIAGNOSIS — I7143 Infrarenal abdominal aortic aneurysm, without rupture: Secondary | ICD-10-CM | POA: Diagnosis present

## 2016-10-14 DIAGNOSIS — J449 Chronic obstructive pulmonary disease, unspecified: Secondary | ICD-10-CM | POA: Diagnosis not present

## 2016-10-14 LAB — BASIC METABOLIC PANEL
ANION GAP: 11 (ref 5–15)
BUN: 10 mg/dL (ref 6–20)
CALCIUM: 9.5 mg/dL (ref 8.9–10.3)
CO2: 25 mmol/L (ref 22–32)
Chloride: 102 mmol/L (ref 101–111)
Creatinine, Ser: 0.74 mg/dL (ref 0.44–1.00)
GFR calc Af Amer: >60 mL/min (ref >60)
GFR calc non Af Amer: >60 mL/min (ref >60)
Glucose, Bld: 113 mg/dL — ABNORMAL HIGH (ref 65–99)
Potassium: 4.1 mmol/L (ref 3.5–5.1)
Sodium: 138 mmol/L (ref 135–145)

## 2016-10-14 LAB — CBC
HEMATOCRIT: 47.5 % — AB (ref 36.0–46.0)
HEMOGLOBIN: 16.1 g/dL — AB (ref 12.0–15.0)
MCH: 31.5 pg (ref 26.0–34.0)
MCHC: 33.9 g/dL (ref 30.0–36.0)
MCV: 93 fL (ref 78.0–100.0)
Platelets: 159 10*3/uL (ref 150–400)
RBC: 5.11 MIL/uL (ref 3.87–5.11)
RDW: 12.6 % (ref 11.5–15.5)
WBC: 11.8 10*3/uL — AB (ref 4.0–10.5)

## 2016-10-14 LAB — ETHANOL

## 2016-10-14 MED ORDER — BUDESONIDE 0.5 MG/2ML IN SUSP
0.5000 mg | Freq: Two times a day (BID) | RESPIRATORY_TRACT | Status: DC
  Administered 2016-10-14 – 2016-10-15 (×3): 0.5 mg via RESPIRATORY_TRACT
  Filled 2016-10-14 (×3): qty 2

## 2016-10-14 MED ORDER — ENOXAPARIN SODIUM 40 MG/0.4ML ~~LOC~~ SOLN
40.0000 mg | SUBCUTANEOUS | Status: DC
  Administered 2016-10-14: 40 mg via SUBCUTANEOUS
  Filled 2016-10-14 (×2): qty 0.4

## 2016-10-14 MED ORDER — ACETAMINOPHEN 650 MG RE SUPP: 650.0000 mg | Freq: Four times a day (QID) | RECTAL | Status: DC | PRN

## 2016-10-14 MED ORDER — METHYLPREDNISOLONE SODIUM SUCC 125 MG IJ SOLR
60.0000 mg | Freq: Three times a day (TID) | INTRAMUSCULAR | Status: DC
  Administered 2016-10-14 – 2016-10-15 (×4): 60 mg via INTRAVENOUS
  Filled 2016-10-14 (×4): qty 2

## 2016-10-14 MED ORDER — LORAZEPAM 2 MG/ML IJ SOLN: 1.0000 mg | Freq: Once | INTRAMUSCULAR | Status: DC

## 2016-10-14 MED ORDER — ONDANSETRON HCL 4 MG PO TABS: 4.0000 mg | ORAL_TABLET | Freq: Four times a day (QID) | ORAL | Status: DC | PRN

## 2016-10-14 MED ORDER — IPRATROPIUM-ALBUTEROL 0.5-2.5 (3) MG/3ML IN SOLN: 3.0000 mL | RESPIRATORY_TRACT | Status: DC | PRN

## 2016-10-14 MED ORDER — IPRATROPIUM-ALBUTEROL 0.5-2.5 (3) MG/3ML IN SOLN
3.0000 mL | Freq: Four times a day (QID) | RESPIRATORY_TRACT | Status: DC
  Administered 2016-10-14: 3 mL via RESPIRATORY_TRACT
  Filled 2016-10-14: qty 3

## 2016-10-14 MED ORDER — ONDANSETRON HCL 4 MG/2ML IJ SOLN: 4.0000 mg | Freq: Four times a day (QID) | INTRAMUSCULAR | Status: DC | PRN

## 2016-10-14 MED ORDER — LEVOFLOXACIN IN D5W 750 MG/150ML IV SOLN
750.0000 mg | INTRAVENOUS | Status: DC
  Administered 2016-10-14 – 2016-10-15 (×2): 750 mg via INTRAVENOUS
  Filled 2016-10-14 (×2): qty 150

## 2016-10-14 MED ORDER — IPRATROPIUM-ALBUTEROL 0.5-2.5 (3) MG/3ML IN SOLN
3.0000 mL | Freq: Three times a day (TID) | RESPIRATORY_TRACT | Status: DC
  Administered 2016-10-14 – 2016-10-15 (×3): 3 mL via RESPIRATORY_TRACT
  Filled 2016-10-14 (×3): qty 3

## 2016-10-14 MED ORDER — GADOBENATE DIMEGLUMINE 529 MG/ML IV SOLN
17.0000 mL | Freq: Once | INTRAVENOUS | Status: AC | PRN
  Administered 2016-10-14: 17 mL via INTRAVENOUS

## 2016-10-14 MED ORDER — ARFORMOTEROL TARTRATE 15 MCG/2ML IN NEBU
15.0000 ug | INHALATION_SOLUTION | Freq: Two times a day (BID) | RESPIRATORY_TRACT | Status: DC
  Administered 2016-10-14 – 2016-10-15 (×3): 15 ug via RESPIRATORY_TRACT
  Filled 2016-10-14 (×3): qty 2

## 2016-10-14 MED ORDER — LORAZEPAM 2 MG/ML IJ SOLN
1.0000 mg | Freq: Once | INTRAMUSCULAR | Status: AC
  Administered 2016-10-14: 1 mg via INTRAVENOUS
  Filled 2016-10-14: qty 1

## 2016-10-14 MED ORDER — ACETAMINOPHEN 325 MG PO TABS
650.0000 mg | ORAL_TABLET | Freq: Four times a day (QID) | ORAL | Status: DC | PRN
  Administered 2016-10-14 (×3): 650 mg via ORAL
  Filled 2016-10-14 (×3): qty 2

## 2016-10-14 MED ORDER — GUAIFENESIN ER 600 MG PO TB12
600.0000 mg | ORAL_TABLET | Freq: Two times a day (BID) | ORAL | Status: DC
  Administered 2016-10-14 – 2016-10-15 (×4): 600 mg via ORAL
  Filled 2016-10-14 (×5): qty 1

## 2016-10-14 NOTE — Progress Notes (Signed)
   Per patient, Advanced Directive not needed.  Introduced Art gallery manager.  Chaplain not needed at this time.  Please page on-call chaplain or contact the spiritual care department, as needed.  Will follow, as needed.  - Rev. [place] MDiv ThM
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
## 2016-10-14 NOTE — Progress Notes (Signed)
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 prevertebral fluid or swelling. No visible canal hematoma. Disc levels: Mild disc space narrowing at C5-6 and C6-7. No jumped facets. No significant neural foraminal encroachment. 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 Upper chest: Negative. Other: None IMPRESSION: 1. No acute intracranial abnormality. 2. Age-indeterminate nasal tip fracture without displacement. 3. No acute cervical spine fracture or subluxation. Electronically Signed   By: Ashley Royalty M.D.   On: 10/13/2016 20:22     LOS: 0 days   Oren Binet, MD  [facility] Pager:[phone]  If 7PM-7AM, please contact night-coverage [email or web address] Password [email or web address] 10/14/2016, 1:02 PM

## 2016-10-14 NOTE — Progress Notes (Signed)
Patient off unit to MRI. Jewelry removed and placed in bedside drawer.

## 2016-10-14 NOTE — Progress Notes (Signed)
PT Cancellation Note  Patient Details Name: Courtney West MRN: [id number] DOB: [birth year]   Cancelled Treatment:    Reason Eval/Treat Not Completed: Patient at procedure or test/unavailable. Pt off the floor at EEG. PT to return as able to complete eval.   Temitope Flammer M Rodrigus Kilker 10/14/2016, 2:36 PM   Kittie Plater, PT, DPT Pager #: [phone] Office #: [phone]

## 2016-10-14 NOTE — Procedures (Signed)
ELECTROENCEPHALOGRAM REPORT  Date of Study: 10/14/2016  Patient's Name: Courtney West MRN: [id number] Date of Birth: 10/30/1950  Referring Provider: Dr. Oren Binet  Clinical History: This is a 66 year old woman with confusion.  Medications: acetaminophen (TYLENOL) tablet 650 mg  arformoterol (BROVANA) nebulizer solution 15 mcg  budesonide (PULMICORT) nebulizer solution 0.5 mg  enoxaparin (LOVENOX) injection 40 mg  guaiFENesin (MUCINEX) 12 hr tablet 600 mg  ipratropium-albuterol (DUONEB) 0.5-2.5 (3) MG/3ML nebulizer solution 3 mL  ipratropium-albuterol (DUONEB) 0.5-2.5 (3) MG/3ML nebulizer solution 3 mL  levofloxacin (LEVAQUIN) IVPB 750 mg  methylPREDNISolone sodium succinate (SOLU-MEDROL) 125 mg/2 mL injection 60 mg  ondansetron (ZOFRAN) injection 4 mg  Technical Summary: A multichannel digital EEG recording measured by the international 10-20 system with electrodes applied with paste and impedances below 5000 ohms performed in our laboratory with EKG monitoring in an awake and asleep patient.  Hyperventilation and photic stimulation were not performed.  The digital EEG was referentially recorded, reformatted, and digitally filtered in a variety of bipolar and referential montages for optimal display.    Description: The patient is awake and asleep during the recording.  During maximal wakefulness, there is a symmetric, medium voltage 8-9 Hz posterior dominant rhythm that attenuates with eye opening.  The record is symmetric.  During drowsiness and sleep, there is an increase in theta slowing of the background, at times sharply contoured over the left temporal region.  Vertex waves and symmetric sleep spindles were seen.  Hyperventilation and photic stimulation were not performed.  There were no epileptiform discharges or electrographic seizures seen.    EKG lead was unremarkable.  Impression: This awake and asleep EEG is normal.    Clinical Correlation: A normal EEG does not  exclude a clinical diagnosis of epilepsy.  Clinical correlation is advised.   Ellouise Newer, M.D.

## 2016-10-14 NOTE — Progress Notes (Signed)
EEG completed, results pending. 

## 2016-10-14 NOTE — Progress Notes (Signed)
Paged MD regarding anxiety medication for MRI. Verbal orders for '1mg'$  IV ativan once prior to MRI.

## 2016-10-15 ENCOUNTER — Ambulatory Visit: Payer: Medicare Other | Admitting: Orthopaedic Surgery

## 2016-10-15 ENCOUNTER — Inpatient Hospital Stay (HOSPITAL_COMMUNITY): Payer: No Typology Code available for payment source

## 2016-10-15 DIAGNOSIS — J449 Chronic obstructive pulmonary disease, unspecified: Secondary | ICD-10-CM

## 2016-10-15 LAB — ECHOCARDIOGRAM COMPLETE
Height: 64 in
WEIGHTICAEL: 2724.8 [oz_av]

## 2016-10-15 MED ORDER — PREDNISONE 10 MG PO TABS: ORAL_TABLET | ORAL | 0 refills | Status: DC

## 2016-10-15 MED ORDER — BACLOFEN 10 MG PO TABS
10.0000 mg | ORAL_TABLET | Freq: Two times a day (BID) | ORAL | Status: DC
  Administered 2016-10-15: 10 mg via ORAL
  Filled 2016-10-15 (×2): qty 1

## 2016-10-15 MED ORDER — GUAIFENESIN ER 600 MG PO TB12: 600.0000 mg | ORAL_TABLET | Freq: Two times a day (BID) | ORAL | 0 refills | Status: DC

## 2016-10-15 MED ORDER — HYDROCODONE-ACETAMINOPHEN 5-325 MG PO TABS
2.0000 | ORAL_TABLET | Freq: Once | ORAL | Status: AC
  Administered 2016-10-15: 2 via ORAL
  Filled 2016-10-15: qty 2

## 2016-10-15 MED ORDER — GABAPENTIN 300 MG PO CAPS
600.0000 mg | ORAL_CAPSULE | Freq: Three times a day (TID) | ORAL | Status: DC
  Administered 2016-10-15: 600 mg via ORAL
  Filled 2016-10-15: qty 2
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
## 2016-10-15 NOTE — Discharge Instructions (Signed)
  °  Per [facility] statutes, patients with seizures are not allowed to drive until they have been seizure-free for six months. Use caution when using heavy equipment or power tools. Avoid working on ladders or at heights. Take showers instead of baths. Ensure the water temperature is not too high on the home water heater. Do not go swimming alone. When caring for infants or small children, sit down when holding, feeding, or changing them to minimize risk of injury to the child in the event you have a seizure.
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
## 2016-10-15 NOTE — Progress Notes (Signed)
Pt has orders to be discharged. Discharge instructions given and pt has no additional questions at this time. Medication regimen reviewed and pt educated. Pt verbalized understanding and has no additional questions. Telemetry box removed. IV removed and site in good condition. Pt stable and waiting for transportation.  Cohl Behrens RN 

## 2016-10-20 ENCOUNTER — Telehealth: Payer: Self-pay | Admitting: Neurology

## 2016-10-20 ENCOUNTER — Ambulatory Visit (INDEPENDENT_AMBULATORY_CARE_PROVIDER_SITE_OTHER): Payer: Medicare Other | Admitting: Neurology

## 2016-10-20 ENCOUNTER — Encounter: Payer: Self-pay | Admitting: Neurology

## 2016-10-20 VITALS — BP 148/86 | HR 76 | Ht 62.0 in | Wt 174.0 lb

## 2016-10-20 DIAGNOSIS — M542 Cervicalgia: Secondary | ICD-10-CM

## 2016-10-20 DIAGNOSIS — C3491 Malignant neoplasm of unspecified part of right bronchus or lung: Secondary | ICD-10-CM

## 2016-10-20 DIAGNOSIS — M545 Low back pain: Secondary | ICD-10-CM | POA: Diagnosis not present

## 2016-10-20 DIAGNOSIS — R410 Disorientation, unspecified: Secondary | ICD-10-CM

## 2016-10-20 DIAGNOSIS — C7931 Secondary malignant neoplasm of brain: Secondary | ICD-10-CM

## 2016-10-20 DIAGNOSIS — C7949 Secondary malignant neoplasm of other parts of nervous system: Secondary | ICD-10-CM | POA: Diagnosis not present

## 2016-10-20 DIAGNOSIS — G8929 Other chronic pain: Secondary | ICD-10-CM | POA: Diagnosis not present

## 2016-10-20 MED ORDER — LEVETIRACETAM 500 MG PO TABS: 500.0000 mg | ORAL_TABLET | Freq: Two times a day (BID) | ORAL | 11 refills | Status: DC
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
## 2016-10-20 NOTE — Telephone Encounter (Signed)
I called the patient to schedule her MRI at [facility] she informed me that another one of her doctors had order her a MRI and she didn't know if she should have that one done. I informed her that I only schedule the orders that Dr. Krista Blue order. She sated that she would call her other doctor office and find out if she needs their MRI.
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
## 2016-10-20 NOTE — Telephone Encounter (Signed)
Noted, thank you

## 2016-10-21 ENCOUNTER — Telehealth: Payer: Self-pay | Admitting: Vascular Surgery

## 2016-10-21 DIAGNOSIS — C3490 Malignant neoplasm of unspecified part of unspecified bronchus or lung: Secondary | ICD-10-CM | POA: Diagnosis not present
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
## 2016-10-21 NOTE — Telephone Encounter (Signed)
Per Dr.Early's verbal instruction and staff message from [place], I scheduled this patient for a new vascular consult only. The appt is for 11/19/16 at 10am and Dr.Early stated that appt date was appropriate.The patient is aware of this appt and I also mailed new pt paperwork as well. awt

## 2016-10-21 NOTE — Telephone Encounter (Signed)
Patient called office in reference to recent office visit.  Patient would like to speak with Dr. Krista Blue and would like to know how she will determine she had a seizure.  Please call

## 2016-10-27 DIAGNOSIS — I714 Abdominal aortic aneurysm, without rupture: Secondary | ICD-10-CM | POA: Diagnosis not present

## 2016-11-05 DIAGNOSIS — J449 Chronic obstructive pulmonary disease, unspecified: Secondary | ICD-10-CM | POA: Diagnosis not present

## 2016-11-05 DIAGNOSIS — Z923 Personal history of irradiation: Secondary | ICD-10-CM | POA: Diagnosis not present

## 2016-11-05 DIAGNOSIS — E785 Hyperlipidemia, unspecified: Secondary | ICD-10-CM | POA: Diagnosis not present

## 2016-11-05 DIAGNOSIS — M549 Dorsalgia, unspecified: Secondary | ICD-10-CM | POA: Diagnosis not present

## 2016-11-05 DIAGNOSIS — C7931 Secondary malignant neoplasm of brain: Secondary | ICD-10-CM | POA: Diagnosis not present

## 2016-11-05 DIAGNOSIS — Z85118 Personal history of other malignant neoplasm of bronchus and lung: Secondary | ICD-10-CM | POA: Diagnosis not present

## 2016-11-05 DIAGNOSIS — G629 Polyneuropathy, unspecified: Secondary | ICD-10-CM | POA: Diagnosis not present

## 2016-11-05 DIAGNOSIS — G4700 Insomnia, unspecified: Secondary | ICD-10-CM | POA: Diagnosis not present

## 2016-11-05 DIAGNOSIS — G8929 Other chronic pain: Secondary | ICD-10-CM | POA: Diagnosis not present

## 2016-11-05 DIAGNOSIS — Z9981 Dependence on supplemental oxygen: Secondary | ICD-10-CM | POA: Diagnosis not present

## 2016-11-05 DIAGNOSIS — Z8673 Personal history of transient ischemic attack (TIA), and cerebral infarction without residual deficits: Secondary | ICD-10-CM | POA: Diagnosis not present

## 2016-11-05 DIAGNOSIS — I714 Abdominal aortic aneurysm, without rupture: Secondary | ICD-10-CM | POA: Diagnosis not present

## 2016-11-07 ENCOUNTER — Encounter: Payer: Self-pay | Admitting: Vascular Surgery

## 2016-11-19 ENCOUNTER — Encounter: Payer: Medicare Other | Admitting: Vascular Surgery

## 2016-11-20 DIAGNOSIS — I440 Atrioventricular block, first degree: Secondary | ICD-10-CM | POA: Diagnosis not present

## 2016-11-21 DIAGNOSIS — C3490 Malignant neoplasm of unspecified part of unspecified bronchus or lung: Secondary | ICD-10-CM | POA: Diagnosis not present

## 2016-12-10 ENCOUNTER — Telehealth: Payer: Self-pay | Admitting: Internal Medicine

## 2016-12-10 NOTE — Telephone Encounter (Signed)
Faxed records to Dr Glendale Chard

## 2016-12-17 DIAGNOSIS — J449 Chronic obstructive pulmonary disease, unspecified: Secondary | ICD-10-CM | POA: Diagnosis not present

## 2016-12-17 DIAGNOSIS — Z85118 Personal history of other malignant neoplasm of bronchus and lung: Secondary | ICD-10-CM | POA: Diagnosis not present

## 2016-12-17 DIAGNOSIS — I714 Abdominal aortic aneurysm, without rupture: Secondary | ICD-10-CM | POA: Diagnosis not present

## 2016-12-17 DIAGNOSIS — Z9221 Personal history of antineoplastic chemotherapy: Secondary | ICD-10-CM | POA: Diagnosis not present

## 2016-12-17 DIAGNOSIS — Z9889 Other specified postprocedural states: Secondary | ICD-10-CM | POA: Diagnosis not present

## 2016-12-17 DIAGNOSIS — I10 Essential (primary) hypertension: Secondary | ICD-10-CM | POA: Diagnosis not present

## 2016-12-17 DIAGNOSIS — E785 Hyperlipidemia, unspecified: Secondary | ICD-10-CM | POA: Diagnosis not present

## 2016-12-21 DIAGNOSIS — C3490 Malignant neoplasm of unspecified part of unspecified bronchus or lung: Secondary | ICD-10-CM | POA: Diagnosis not present

## 2016-12-23 ENCOUNTER — Other Ambulatory Visit (HOSPITAL_BASED_OUTPATIENT_CLINIC_OR_DEPARTMENT_OTHER): Payer: Medicare Other

## 2016-12-23 ENCOUNTER — Ambulatory Visit (HOSPITAL_COMMUNITY)
Admission: RE | Admit: 2016-12-23 | Discharge: 2016-12-23 | Disposition: A | Discharge status: Home / Self Care | Payer: Medicare Other | Source: Ambulatory Visit | Attending: Internal Medicine | Admitting: Internal Medicine

## 2016-12-23 DIAGNOSIS — J449 Chronic obstructive pulmonary disease, unspecified: Secondary | ICD-10-CM | POA: Diagnosis not present

## 2016-12-23 DIAGNOSIS — I714 Abdominal aortic aneurysm, without rupture: Secondary | ICD-10-CM | POA: Diagnosis not present

## 2016-12-23 DIAGNOSIS — I2510 Atherosclerotic heart disease of native coronary artery without angina pectoris: Secondary | ICD-10-CM | POA: Diagnosis not present

## 2016-12-23 DIAGNOSIS — I313 Pericardial effusion (noninflammatory): Secondary | ICD-10-CM | POA: Diagnosis not present

## 2016-12-23 DIAGNOSIS — M4856XA Collapsed vertebra, not elsewhere classified, lumbar region, initial encounter for fracture: Secondary | ICD-10-CM | POA: Insufficient documentation

## 2016-12-23 DIAGNOSIS — Z9049 Acquired absence of other specified parts of digestive tract: Secondary | ICD-10-CM | POA: Diagnosis not present

## 2016-12-23 DIAGNOSIS — Z85118 Personal history of other malignant neoplasm of bronchus and lung: Secondary | ICD-10-CM

## 2016-12-23 DIAGNOSIS — C3491 Malignant neoplasm of unspecified part of right bronchus or lung: Secondary | ICD-10-CM | POA: Diagnosis not present

## 2016-12-23 DIAGNOSIS — I700 Atherosclerosis of aorta: Secondary | ICD-10-CM | POA: Insufficient documentation

## 2016-12-23 DIAGNOSIS — R918 Other nonspecific abnormal finding of lung field: Secondary | ICD-10-CM | POA: Diagnosis not present

## 2016-12-23 LAB — CBC WITH DIFFERENTIAL/PLATELET
BASO%: 1.1 % (ref 0.0–2.0)
Basophils Absolute: 0.1 10*3/uL (ref 0.0–0.1)
EOS%: 1.2 % (ref 0.0–7.0)
Eosinophils Absolute: 0.1 10*3/uL (ref 0.0–0.5)
HCT: 47.1 % — ABNORMAL HIGH (ref 34.8–46.6)
HGB: 15.8 g/dL (ref 11.6–15.9)
LYMPH#: 3.3 10*3/uL (ref 0.9–3.3)
LYMPH%: 33.1 % (ref 14.0–49.7)
MCH: 31.8 pg (ref 25.1–34.0)
MCHC: 33.6 g/dL (ref 31.5–36.0)
MCV: 94.6 fL (ref 79.5–101.0)
MONO#: 0.7 10*3/uL (ref 0.1–0.9)
MONO%: 7 % (ref 0.0–14.0)
NEUT#: 5.8 10*3/uL (ref 1.5–6.5)
NEUT%: 57.6 % (ref 38.4–76.8)
Platelets: 181 10*3/uL (ref 145–400)
RBC: 4.98 10*6/uL (ref 3.70–5.45)
RDW: 14.2 % (ref 11.2–14.5)
WBC: 10.1 10*3/uL (ref 3.9–10.3)

## 2016-12-23 LAB — COMPREHENSIVE METABOLIC PANEL
ALT: 10 U/L (ref 0–55)
AST: 12 U/L (ref 5–34)
Albumin: 3.6 g/dL (ref 3.5–5.0)
Alkaline Phosphatase: 83 U/L (ref 40–150)
Anion Gap: 11 mEq/L (ref 3–11)
BUN: 20.6 mg/dL (ref 7.0–26.0)
CHLORIDE: 104 meq/L (ref 98–109)
CO2: 25 meq/L (ref 22–29)
Calcium: 10 mg/dL (ref 8.4–10.4)
Creatinine: 0.9 mg/dL (ref 0.6–1.1)
EGFR: 72 mL/min/{1.73_m2} — ABNORMAL LOW (ref >90)
Glucose: 97 mg/dl (ref 70–140)
POTASSIUM: 4.3 meq/L (ref 3.5–5.1)
SODIUM: 141 meq/L (ref 136–145)
Total Bilirubin: 0.42 mg/dL (ref 0.20–1.20)
Total Protein: 7.1 g/dL (ref 6.4–8.3)

## 2016-12-23 MED ORDER — IOPAMIDOL (ISOVUE-300) INJECTION 61%
INTRAVENOUS | Status: AC
  Filled 2016-12-23: qty 100

## 2016-12-23 MED ORDER — IOPAMIDOL (ISOVUE-300) INJECTION 61%
100.0000 mL | Freq: Once | INTRAVENOUS | Status: AC | PRN
  Administered 2016-12-23: 100 mL via INTRAVENOUS

## 2016-12-31 ENCOUNTER — Ambulatory Visit: Payer: Medicare Other | Admitting: Internal Medicine

## 2017-01-20 ENCOUNTER — Telehealth: Payer: Self-pay

## 2017-01-20 NOTE — Telephone Encounter (Signed)
Spoke with patient per inbasket and rescheduled her last missed appt  Courtney West

## 2017-01-21 DIAGNOSIS — C3490 Malignant neoplasm of unspecified part of unspecified bronchus or lung: Secondary | ICD-10-CM | POA: Diagnosis not present

## 2017-02-11 ENCOUNTER — Other Ambulatory Visit: Payer: Self-pay | Admitting: Radiation Oncology

## 2017-02-11 NOTE — Telephone Encounter (Signed)
Sending to [employer] RN

## 2017-02-17 ENCOUNTER — Ambulatory Visit (HOSPITAL_BASED_OUTPATIENT_CLINIC_OR_DEPARTMENT_OTHER): Payer: Medicare Other | Admitting: Internal Medicine

## 2017-02-17 ENCOUNTER — Telehealth: Payer: Self-pay | Admitting: Internal Medicine

## 2017-02-17 ENCOUNTER — Encounter: Payer: Self-pay | Admitting: Internal Medicine

## 2017-02-17 VITALS — BP 105/78 | HR 86 | Temp 99.1°F | Resp 18 | Ht 62.0 in | Wt 164.1 lb

## 2017-02-17 DIAGNOSIS — C3491 Malignant neoplasm of unspecified part of right bronchus or lung: Secondary | ICD-10-CM

## 2017-02-17 DIAGNOSIS — C7949 Secondary malignant neoplasm of other parts of nervous system: Secondary | ICD-10-CM

## 2017-02-17 DIAGNOSIS — Z85118 Personal history of other malignant neoplasm of bronchus and lung: Secondary | ICD-10-CM | POA: Diagnosis not present

## 2017-02-17 DIAGNOSIS — C7931 Secondary malignant neoplasm of brain: Secondary | ICD-10-CM

## 2017-02-17 DIAGNOSIS — I2510 Atherosclerotic heart disease of native coronary artery without angina pectoris: Secondary | ICD-10-CM

## 2017-02-17 NOTE — Progress Notes (Signed)
[place] Telephone:([phone]   [phone]  OFFICE PROGRESS NOTE  Glendale Chard, [address] [place] [address]  DIAGNOSIS: Metastatic non-small cell lung cancer, favoring adenocarcinoma diagnosed in July of 2012, presented with locally advanced disease in the chest as well as brain metastasis. Veristrat test poor.   PRIOR THERAPY:  1. Status post stereotactic radiotherapy to 2 brain lesions under the care of Dr. Lisbeth Renshaw. 2. Status post a course of concurrent chemoradiation with weekly carboplatin and paclitaxel, last dose of chemotherapy was given 03/05/2011. 3. Systemic chemotherapy with carboplatin for AUC of 5 and Alimta 500 mg/M2. The patient is status post 3 cycles. Last dose was given 06/03/2011  CURRENT THERAPY: Observation.  INTERVAL HISTORY: Courtney West 66 y.o. female returns to the clinic today for follow-up visit. The patient is feeling fine today with no specific complaints. She denied having any chest pain, shortness of breath, cough or hemoptysis. She had an aneurysm repair at [facility]. She denied having any fever or chills. She denied having any significant weight loss or night sweats. She has been observation for several years now and she is doing fine. She had repeat CT scan of the chest, abdomen and pelvis performed last month and she is here for evaluation and discussion of her scan results.  MEDICAL HISTORY: Past Medical History:  Diagnosis Date  . Abdominal aortic aneurysm ([place])   . Allergy    codeine,pcn,sulfa drugs  . Brain cancer ([place])    mets from lung primary  . Clotted vascular catheter ([place]) 03/30/2012  . COPD (chronic obstructive pulmonary disease) ([place])   . Dyslipidemia   . History of radiation therapy 01/19/12, 01/21/12, 01/26/12   RLL lung  . History of radiation therapy 04/18/11   single fraction to 6 brain mets  . Hypertension   . Lung cancer ([place]) 12/2010  . Lung cancer ([place])  12/22/2010  . Radiation 10/20/2011   frontal mets/Palliation  . S/P radiation therapy 7/12 thru 9/12, 04/18/11   xrt to brain mets  . Stroke [facility])     ALLERGIES:  is allergic to sulfa antibiotics; carboplatin; codeine; dilaudid [hydromorphone hcl]; and penicillins.  MEDICATIONS:  Current Outpatient Prescriptions  Medication Sig Dispense Refill  . acetaminophen (TYLENOL) 500 MG tablet Take 500 mg by mouth every 6 (six) hours as needed.    . baclofen (LIORESAL) 10 MG tablet Take 10 mg by mouth 2 (two) times daily.    . budesonide-formoterol (SYMBICORT) 80-4.5 MCG/ACT inhaler Inhale 2 puffs into the lungs 2 (two) times daily.    . Cholecalciferol 2000 units CAPS Take 1 capsule by mouth daily.    Marland Kitchen doxylamine, Sleep, (UNISOM) 25 MG tablet Take 25 mg by mouth at bedtime as needed.    . gabapentin (NEURONTIN) 300 MG capsule Take 2 capsules (600 mg total) by mouth 3 (three) times daily. 180 capsule 6  . Melatonin 5 MG CAPS Take 1 capsule by mouth at bedtime as needed.    . Turmeric 500 MG CAPS Take 1 capsule by mouth daily.     No current facility-administered medications for this visit.     SURGICAL HISTORY:  Past Surgical History:  Procedure Laterality Date  . CARPAL TUNNEL RELEASE    . CHOLECYSTECTOMY    . left knee surgery    . PARTIAL HYSTERECTOMY      REVIEW OF SYSTEMS:  A comprehensive review of systems was negative.   PHYSICAL EXAMINATION: General  appearance: alert, cooperative and no distress Head: Normocephalic, without obvious abnormality, atraumatic Neck: no adenopathy, no JVD, supple, symmetrical, trachea midline and thyroid not enlarged, symmetric, no tenderness/mass/nodules Lymph nodes: Cervical, supraclavicular, and axillary nodes normal. Resp: wheezes bilaterally Back: symmetric, no curvature. ROM normal. No CVA tenderness. Cardio: regular rate and rhythm, S1, S2 normal, no murmur, click, rub or gallop GI: soft, non-tender; bowel sounds normal; no masses,  no  organomegaly Extremities: extremities normal, atraumatic, no cyanosis or edema  ECOG PERFORMANCE STATUS: 0 - Asymptomatic  Blood pressure 105/78, pulse 86, temperature 99.1 F (37.3 C), temperature source Oral, resp. rate 18, height 5\' 2"  (1.575 m), weight 164 lb 1.6 oz (74.4 kg), SpO2 93 %.  LABORATORY DATA: Lab Results  Component Value Date   WBC 10.1 12/23/2016   HGB 15.8 12/23/2016   HCT 47.1 (H) 12/23/2016   MCV 94.6 12/23/2016   PLT 181 12/23/2016      Chemistry      Component Value Date/Time   NA 141 12/23/2016 0856   K 4.3 12/23/2016 0856   CL 102 10/14/2016 0039   CL 106 10/06/2012 1303   CO2 25 12/23/2016 0856   BUN 20.6 12/23/2016 0856   CREATININE 0.9 12/23/2016 0856      Component Value Date/Time   CALCIUM 10.0 12/23/2016 0856   ALKPHOS 83 12/23/2016 0856   AST 12 12/23/2016 0856   ALT 10 12/23/2016 0856   BILITOT 0.42 12/23/2016 0856       RADIOGRAPHIC STUDIES: No results found. ASSESSMENT AND PLAN:  This is a very pleasant 66 years old white female with metastatic non-small cell lung cancer status post stereotactic radiotherapy to the brain as well as a course of concurrent chemoradiation followed by systemic chemotherapy with carboplatin and Alimta. The patient has been observation since 2012 and she has been doing very well. Her most recent CT scan of the chest, abdomen and pelvis showed no evidence for disease progression. I discussed the scan results with the patient and recommended for her to continue on observation with repeat CT scan of the chest, abdomen and pelvis in 6 months. For the coronary artery disease seen on the most recent CT scan, I recommended for the patient to discuss the results with her primary care physician for evaluation and management. The patient was advised to call immediately if she has any concerning symptoms in the interval. I counseled the patient today about the smoke cessation but unfortunately she is not willing to  quit smoking. The patient voices understanding of current disease status and treatment options and is in agreement with the current care plan. All questions were answered. The patient knows to call the clinic with any problems, questions or concerns. We can certainly see the patient much sooner if necessary. I spent 10 minutes counseling the patient face to face. The total time spent in the appointment was 15 minutes. Disclaimer: This note was dictated with voice recognition software. Similar sounding words can inadvertently be transcribed and may not be corrected upon review.

## 2017-02-17 NOTE — Telephone Encounter (Signed)
Left message for patient regarding upcoming march appointment.

## 2017-02-17 NOTE — Patient Instructions (Signed)
Steps to Quit Smoking Smoking tobacco can be bad for your health. It can also affect almost every organ in your body. Smoking puts you and people around you at risk for many serious long-lasting (chronic) diseases. Quitting smoking is hard, but it is one of the best things that you can do for your health. It is never too late to quit. What are the benefits of quitting smoking? When you quit smoking, you lower your risk for getting serious diseases and conditions. They can include:  Lung cancer or lung disease.  Heart disease.  Stroke.  Heart attack.  Not being able to have children (infertility).  Weak bones (osteoporosis) and broken bones (fractures).  If you have coughing, wheezing, and shortness of breath, those symptoms may get better when you quit. You may also get sick less often. If you are pregnant, quitting smoking can help to lower your chances of having a baby of low birth weight. What can I do to help me quit smoking? Talk with your doctor about what can help you quit smoking. Some things you can do (strategies) include:  Quitting smoking totally, instead of slowly cutting back how much you smoke over a period of time.  Going to in-person counseling. You are more likely to quit if you go to many counseling sessions.  Using resources and support systems, such as: ? Online chats with a counselor. ? Phone quitlines. ? Printed self-help materials. ? Support groups or group counseling. ? Text messaging programs. ? Mobile phone apps or applications.  Taking medicines. Some of these medicines may have nicotine in them. If you are pregnant or breastfeeding, do not take any medicines to quit smoking unless your doctor says it is okay. Talk with your doctor about counseling or other things that can help you.  Talk with your doctor about using more than one strategy at the same time, such as taking medicines while you are also going to in-person counseling. This can help make  quitting easier. What things can I do to make it easier to quit? Quitting smoking might feel very hard at first, but there is a lot that you can do to make it easier. Take these steps:  Talk to your family and friends. Ask them to support and encourage you.  Call phone quitlines, reach out to support groups, or work with a counselor.  Ask people who smoke to not smoke around you.  Avoid places that make you want (trigger) to smoke, such as: ? Bars. ? Parties. ? Smoke-break areas at work.  Spend time with people who do not smoke.  Lower the stress in your life. Stress can make you want to smoke. Try these things to help your stress: ? Getting regular exercise. ? Deep-breathing exercises. ? Yoga. ? Meditating. ? Doing a body scan. To do this, close your eyes, focus on one area of your body at a time from head to toe, and notice which parts of your body are tense. Try to relax the muscles in those areas.  Download or buy apps on your mobile phone or tablet that can help you stick to your quit plan. There are many free apps, such as QuitGuide from the CDC (Centers for Disease Control and Prevention). You can find more support from smokefree.gov and other websites.  This information is not intended to replace advice given to you by your health care provider. Make sure you discuss any questions you have with your health care provider. Document Released: 04/05/2009 Document   Revised: 02/05/2016 Document Reviewed: 10/24/2014 Elsevier Interactive Patient Education  2018 Elsevier Inc.  

## 2017-02-17 NOTE — Telephone Encounter (Signed)
Gave patient AVS and calendar of upcoming February appointment.

## 2017-02-21 DIAGNOSIS — C3490 Malignant neoplasm of unspecified part of unspecified bronchus or lung: Secondary | ICD-10-CM | POA: Diagnosis not present

## 2017-03-23 DIAGNOSIS — C3490 Malignant neoplasm of unspecified part of unspecified bronchus or lung: Secondary | ICD-10-CM | POA: Diagnosis not present

## 2017-04-09 ENCOUNTER — Ambulatory Visit: Payer: Self-pay | Admitting: Urology

## 2017-04-15 ENCOUNTER — Telehealth: Payer: Self-pay | Admitting: Radiation Oncology

## 2017-04-15 DIAGNOSIS — Z85841 Personal history of malignant neoplasm of brain: Secondary | ICD-10-CM | POA: Diagnosis not present

## 2017-04-15 DIAGNOSIS — C7931 Secondary malignant neoplasm of brain: Secondary | ICD-10-CM | POA: Diagnosis not present

## 2017-04-15 DIAGNOSIS — J449 Chronic obstructive pulmonary disease, unspecified: Secondary | ICD-10-CM | POA: Diagnosis not present

## 2017-04-15 DIAGNOSIS — C3490 Malignant neoplasm of unspecified part of unspecified bronchus or lung: Secondary | ICD-10-CM | POA: Diagnosis not present

## 2017-04-15 DIAGNOSIS — G4700 Insomnia, unspecified: Secondary | ICD-10-CM | POA: Diagnosis not present

## 2017-04-15 DIAGNOSIS — E559 Vitamin D deficiency, unspecified: Secondary | ICD-10-CM | POA: Diagnosis not present

## 2017-04-15 NOTE — Telephone Encounter (Signed)
-----   Message from Freeman Caldron, [place] sent at 04/14/2017  5:17 PM EDT ----- Regarding: RE: Medication refill request This request needs to be directed to her neurologist, Dr. Marcial Pacas since he is managing this medication.  Courtney West ----- Message ----- From: Heywood Footman, RN Sent: 04/14/2017   1:17 PM To: Freeman Caldron, PA-C Subject: Medication refill request                      Received a fax from [facility] requesting a refill of gabapentin 300 mg capsule.   Diagnosis:  [name] is a 66 y.o. 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   Interval Since Last Radiation:  3 years, 5 months  Narrative: [name] is a pleasant patient with a history of a right lung cancer, who comes today for follow-up. She is status  Post : 1. SRS to a 10-mm left frontal and 5-mm right frontal brain metastasis to 20 Gy on January 16, 2011.  2. The patient's primary lung cancer was treated to 66 Gy in 33 fractions of 2 Gy through March 10, 2011 . 3. SRS to 6 new brain metastases (2 adjacent left temporal brain metastases, a 4.7-mm vermis target, a 3.4-mm right frontal brain metastasis, a left parietal 5.3-mm brain metastasis, and a 6.6-mm left cerebellar brain metastasis) received 20 Gy in a single fraction.April 18, 2011. 4. SRS to a 4 mm posterior frontal target to 20 Gy on 10/20/2011       5.   SBRT to a 1.6 cm metachronous clinical stage IA primary cancer of the right lower lung on 01/19/12, 01/21/12, and 01/26/12 to 54 gray in 3 fractions of 18 gr  [name]

## 2017-04-15 NOTE — Telephone Encounter (Signed)
Received refill authorization request for patient's gabapentin from [facility] in [place]. Faxed back denial to [phone]. Per Freeman Caldron, PA-C patient should request refill from neurologist, Dr. Marcial Pacas. Fax confirmation of delivery obtained.

## 2017-04-23 DIAGNOSIS — C3490 Malignant neoplasm of unspecified part of unspecified bronchus or lung: Secondary | ICD-10-CM | POA: Diagnosis not present

## 2017-05-23 DIAGNOSIS — C3490 Malignant neoplasm of unspecified part of unspecified bronchus or lung: Secondary | ICD-10-CM | POA: Diagnosis not present

## 2017-06-03 DIAGNOSIS — Z9889 Other specified postprocedural states: Secondary | ICD-10-CM | POA: Diagnosis not present

## 2017-06-03 DIAGNOSIS — I10 Essential (primary) hypertension: Secondary | ICD-10-CM | POA: Diagnosis not present

## 2017-06-03 DIAGNOSIS — J449 Chronic obstructive pulmonary disease, unspecified: Secondary | ICD-10-CM | POA: Diagnosis not present

## 2017-06-03 DIAGNOSIS — Z85118 Personal history of other malignant neoplasm of bronchus and lung: Secondary | ICD-10-CM | POA: Diagnosis not present

## 2017-06-03 DIAGNOSIS — Z8679 Personal history of other diseases of the circulatory system: Secondary | ICD-10-CM | POA: Diagnosis not present

## 2017-06-03 DIAGNOSIS — I714 Abdominal aortic aneurysm, without rupture: Secondary | ICD-10-CM | POA: Diagnosis not present

## 2017-06-03 DIAGNOSIS — Z9221 Personal history of antineoplastic chemotherapy: Secondary | ICD-10-CM | POA: Diagnosis not present

## 2017-06-03 DIAGNOSIS — E785 Hyperlipidemia, unspecified: Secondary | ICD-10-CM | POA: Diagnosis not present

## 2017-06-03 DIAGNOSIS — M549 Dorsalgia, unspecified: Secondary | ICD-10-CM | POA: Diagnosis not present

## 2017-06-23 DIAGNOSIS — C3490 Malignant neoplasm of unspecified part of unspecified bronchus or lung: Secondary | ICD-10-CM | POA: Diagnosis not present

## 2017-07-24 DIAGNOSIS — C3490 Malignant neoplasm of unspecified part of unspecified bronchus or lung: Secondary | ICD-10-CM | POA: Diagnosis not present

## 2017-08-19 ENCOUNTER — Inpatient Hospital Stay: Payer: Medicare Other | Attending: Internal Medicine

## 2017-08-19 ENCOUNTER — Encounter (HOSPITAL_COMMUNITY): Payer: Self-pay | Admitting: Radiology

## 2017-08-19 ENCOUNTER — Ambulatory Visit (HOSPITAL_COMMUNITY)
Admission: RE | Admit: 2017-08-19 | Discharge: 2017-08-19 | Disposition: A | Discharge status: Home / Self Care | Payer: Medicare Other | Source: Ambulatory Visit | Attending: Internal Medicine | Admitting: Internal Medicine

## 2017-08-19 DIAGNOSIS — I714 Abdominal aortic aneurysm, without rupture: Secondary | ICD-10-CM | POA: Insufficient documentation

## 2017-08-19 DIAGNOSIS — Z85118 Personal history of other malignant neoplasm of bronchus and lung: Secondary | ICD-10-CM | POA: Diagnosis not present

## 2017-08-19 DIAGNOSIS — C7949 Secondary malignant neoplasm of other parts of nervous system: Secondary | ICD-10-CM

## 2017-08-19 DIAGNOSIS — C3491 Malignant neoplasm of unspecified part of right bronchus or lung: Secondary | ICD-10-CM | POA: Insufficient documentation

## 2017-08-19 DIAGNOSIS — Z95828 Presence of other vascular implants and grafts: Secondary | ICD-10-CM | POA: Diagnosis not present

## 2017-08-19 DIAGNOSIS — Z923 Personal history of irradiation: Secondary | ICD-10-CM | POA: Insufficient documentation

## 2017-08-19 DIAGNOSIS — C7931 Secondary malignant neoplasm of brain: Secondary | ICD-10-CM | POA: Insufficient documentation

## 2017-08-19 DIAGNOSIS — I313 Pericardial effusion (noninflammatory): Secondary | ICD-10-CM | POA: Insufficient documentation

## 2017-08-19 DIAGNOSIS — M899 Disorder of bone, unspecified: Secondary | ICD-10-CM | POA: Diagnosis not present

## 2017-08-19 DIAGNOSIS — J439 Emphysema, unspecified: Secondary | ICD-10-CM | POA: Insufficient documentation

## 2017-08-19 DIAGNOSIS — Z85841 Personal history of malignant neoplasm of brain: Secondary | ICD-10-CM | POA: Diagnosis not present

## 2017-08-19 DIAGNOSIS — Z9221 Personal history of antineoplastic chemotherapy: Secondary | ICD-10-CM | POA: Insufficient documentation

## 2017-08-19 DIAGNOSIS — C3490 Malignant neoplasm of unspecified part of unspecified bronchus or lung: Secondary | ICD-10-CM | POA: Diagnosis not present

## 2017-08-19 LAB — CBC WITH DIFFERENTIAL/PLATELET
BASOS ABS: 0.2 10*3/uL — AB (ref 0.0–0.1)
BASOS PCT: 2 %
Eosinophils Absolute: 0.1 10*3/uL (ref 0.0–0.5)
Eosinophils Relative: 1 %
HCT: 49 % — ABNORMAL HIGH (ref 34.8–46.6)
HEMOGLOBIN: 16.4 g/dL — AB (ref 11.6–15.9)
LYMPHS PCT: 28 %
Lymphs Abs: 3 10*3/uL (ref 0.9–3.3)
MCH: 31.4 pg (ref 25.1–34.0)
MCHC: 33.4 g/dL (ref 31.5–36.0)
MCV: 93.9 fL (ref 79.5–101.0)
Monocytes Absolute: 0.8 10*3/uL (ref 0.1–0.9)
Monocytes Relative: 7 %
NEUTROS PCT: 62 %
Neutro Abs: 6.8 10*3/uL — ABNORMAL HIGH (ref 1.5–6.5)
Platelets: 198 10*3/uL (ref 145–400)
RBC: 5.22 MIL/uL (ref 3.70–5.45)
RDW: 13.2 % (ref 11.2–14.5)
WBC: 11 10*3/uL — ABNORMAL HIGH (ref 3.9–10.3)

## 2017-08-19 LAB — COMPREHENSIVE METABOLIC PANEL
ALBUMIN: 3.7 g/dL (ref 3.5–5.0)
ALK PHOS: 90 U/L (ref 40–150)
ALT: 12 U/L (ref 0–55)
AST: 14 U/L (ref 5–34)
Anion gap: 11 (ref 3–11)
BILIRUBIN TOTAL: 0.5 mg/dL (ref 0.2–1.2)
BUN: 10 mg/dL (ref 7–26)
CALCIUM: 10.1 mg/dL (ref 8.4–10.4)
CO2: 26 mmol/L (ref 22–29)
CREATININE: 0.83 mg/dL (ref 0.60–1.10)
Chloride: 105 mmol/L (ref 98–109)
GFR calc Af Amer: >60 mL/min (ref >60)
GLUCOSE: 85 mg/dL (ref 70–140)
Potassium: 4.7 mmol/L (ref 3.5–5.1)
Sodium: 142 mmol/L (ref 136–145)
Total Protein: 7.6 g/dL (ref 6.4–8.3)

## 2017-08-19 MED ORDER — IOPAMIDOL (ISOVUE-300) INJECTION 61%
INTRAVENOUS | Status: AC
  Administered 2017-08-19: 100 mL
  Filled 2017-08-19: qty 100

## 2017-08-20 ENCOUNTER — Ambulatory Visit: Payer: Medicare Other | Admitting: Internal Medicine

## 2017-08-21 DIAGNOSIS — C3490 Malignant neoplasm of unspecified part of unspecified bronchus or lung: Secondary | ICD-10-CM | POA: Diagnosis not present

## 2017-08-26 ENCOUNTER — Ambulatory Visit
Admission: RE | Admit: 2017-08-26 | Discharge: 2017-08-26 | Disposition: A | Discharge status: Home / Self Care | Payer: Medicare Other | Source: Ambulatory Visit | Attending: Urology | Admitting: Urology

## 2017-08-26 ENCOUNTER — Encounter: Payer: Self-pay | Admitting: *Deleted

## 2017-08-26 ENCOUNTER — Encounter: Payer: Self-pay | Admitting: Internal Medicine

## 2017-08-26 ENCOUNTER — Inpatient Hospital Stay: Payer: Medicare Other | Attending: Internal Medicine | Admitting: Internal Medicine

## 2017-08-26 DIAGNOSIS — Z79899 Other long term (current) drug therapy: Secondary | ICD-10-CM | POA: Diagnosis not present

## 2017-08-26 DIAGNOSIS — Z923 Personal history of irradiation: Secondary | ICD-10-CM | POA: Insufficient documentation

## 2017-08-26 DIAGNOSIS — Z9221 Personal history of antineoplastic chemotherapy: Secondary | ICD-10-CM | POA: Insufficient documentation

## 2017-08-26 DIAGNOSIS — Z85118 Personal history of other malignant neoplasm of bronchus and lung: Secondary | ICD-10-CM | POA: Insufficient documentation

## 2017-08-26 DIAGNOSIS — Z85841 Personal history of malignant neoplasm of brain: Secondary | ICD-10-CM | POA: Diagnosis not present

## 2017-08-26 DIAGNOSIS — C3490 Malignant neoplasm of unspecified part of unspecified bronchus or lung: Secondary | ICD-10-CM

## 2017-08-26 DIAGNOSIS — F17210 Nicotine dependence, cigarettes, uncomplicated: Secondary | ICD-10-CM | POA: Insufficient documentation

## 2017-08-26 NOTE — Patient Instructions (Signed)
Steps to Quit Smoking Smoking tobacco can be bad for your health. It can also affect almost every organ in your body. Smoking puts you and people around you at risk for many serious long-lasting (chronic) diseases. Quitting smoking is hard, but it is one of the best things that you can do for your health. It is never too late to quit. What are the benefits of quitting smoking? When you quit smoking, you lower your risk for getting serious diseases and conditions. They can include:  Lung cancer or lung disease.  Heart disease.  Stroke.  Heart attack.  Not being able to have children (infertility).  Weak bones (osteoporosis) and broken bones (fractures).  If you have coughing, wheezing, and shortness of breath, those symptoms may get better when you quit. You may also get sick less often. If you are pregnant, quitting smoking can help to lower your chances of having a baby of low birth weight. What can I do to help me quit smoking? Talk with your doctor about what can help you quit smoking. Some things you can do (strategies) include:  Quitting smoking totally, instead of slowly cutting back how much you smoke over a period of time.  Going to in-person counseling. You are more likely to quit if you go to many counseling sessions.  Using resources and support systems, such as: ? Online chats with a counselor. ? Phone quitlines. ? Printed self-help materials. ? Support groups or group counseling. ? Text messaging programs. ? Mobile phone apps or applications.  Taking medicines. Some of these medicines may have nicotine in them. If you are pregnant or breastfeeding, do not take any medicines to quit smoking unless your doctor says it is okay. Talk with your doctor about counseling or other things that can help you.  Talk with your doctor about using more than one strategy at the same time, such as taking medicines while you are also going to in-person counseling. This can help make  quitting easier. What things can I do to make it easier to quit? Quitting smoking might feel very hard at first, but there is a lot that you can do to make it easier. Take these steps:  Talk to your family and friends. Ask them to support and encourage you.  Call phone quitlines, reach out to support groups, or work with a counselor.  Ask people who smoke to not smoke around you.  Avoid places that make you want (trigger) to smoke, such as: ? Bars. ? Parties. ? Smoke-break areas at work.  Spend time with people who do not smoke.  Lower the stress in your life. Stress can make you want to smoke. Try these things to help your stress: ? Getting regular exercise. ? Deep-breathing exercises. ? Yoga. ? Meditating. ? Doing a body scan. To do this, close your eyes, focus on one area of your body at a time from head to toe, and notice which parts of your body are tense. Try to relax the muscles in those areas.  Download or buy apps on your mobile phone or tablet that can help you stick to your quit plan. There are many free apps, such as QuitGuide from the CDC (Centers for Disease Control and Prevention). You can find more support from smokefree.gov and other websites.  This information is not intended to replace advice given to you by your health care provider. Make sure you discuss any questions you have with your health care provider. Document Released: 04/05/2009 Document   Revised: 02/05/2016 Document Reviewed: 10/24/2014 Elsevier Interactive Patient Education  2018 Elsevier Inc.  

## 2017-08-26 NOTE — Progress Notes (Signed)
Courtney West left a message on answering machine per Ashlyn Bruning, PA-C that we were leaving it up to her if she wanted to continue to see Dr. Tammi Klippel as well as Dr Earlie Server it's okay or she could just see Dr. Julien Nordmann because the two doctors's work closely together discussing her medical issues and care as needed.  Dr. Tammi Klippel has not been ordering imaging or scans for sometime now Dr. Earlie Server has been handling those for your care.  So please think about it and let us know if you want to continue seeing both doctors in the future.  Dr. Tammi Klippel will be glad to continue seeing you as a patient.

## 2017-08-26 NOTE — Progress Notes (Signed)
Courtney West Telephone:([phone]) [phone]   [phone]  OFFICE PROGRESS NOTE  [address], [name] [place] [address]  DIAGNOSIS: Metastatic non-small cell lung cancer, favoring adenocarcinoma diagnosed in July of 2012, presented with locally advanced disease in the chest as well as brain metastasis. Veristrat test poor.   PRIOR THERAPY:  1. Status post stereotactic radiotherapy to 2 brain lesions under the care of Dr. Lisbeth Renshaw. 2. Status post a course of concurrent chemoradiation with weekly carboplatin and paclitaxel, last dose of chemotherapy was given 03/05/2011. 3. Systemic chemotherapy with carboplatin for AUC of 5 and Alimta 500 mg/M2. The patient is status post 3 cycles. Last dose was given 06/03/2011  CURRENT THERAPY: Observation.  INTERVAL HISTORY: Courtney West 67 y.o. female returns to the clinic today for 6 months follow-up visit.  The patient is feeling fine today with no specific complaints.  Unfortunately she continues to smoke 1 pack of cigarettes every day and she is not willing to quit.  She has no chest pain but has shortness of breath with exertion with mild cough and no hemoptysis.  She denied having any fever or chills.  She has no nausea, vomiting, diarrhea or constipation.  The patient had repeat CT scan of the chest performed recently and she is here for evaluation and discussion of her scan results.  MEDICAL HISTORY: Past Medical History:  Diagnosis Date  . Abdominal aortic aneurysm ([facility])   . Allergy    codeine,pcn,sulfa drugs  . Brain cancer ([place])    mets from lung primary  . Clotted vascular catheter ([facility]) 03/30/2012  . COPD (chronic obstructive pulmonary disease) ([facility])   . Dyslipidemia   . History of radiation therapy 01/19/12, 01/21/12, 01/26/12   RLL lung  . History of radiation therapy 04/18/11   single fraction to 6 brain mets  . Hypertension   . Lung cancer ([place]) 12/2010  . Lung cancer ([facility]) 12/22/2010  .  Radiation 10/20/2011   frontal mets/Palliation  . S/P radiation therapy 7/12 thru 9/12, 04/18/11   xrt to brain mets  . Stroke [facility])     ALLERGIES:  is allergic to sulfa antibiotics; carboplatin; codeine; dilaudid [hydromorphone hcl]; and penicillins.  MEDICATIONS:  Current Outpatient Medications  Medication Sig Dispense Refill  . acetaminophen (TYLENOL) 500 MG tablet Take 500 mg by mouth every 6 (six) hours as needed.    . baclofen (LIORESAL) 10 MG tablet Take 10 mg by mouth 2 (two) times daily.    . budesonide-formoterol (SYMBICORT) 80-4.5 MCG/ACT inhaler Inhale 2 puffs into the lungs 2 (two) times daily.    . Cholecalciferol 2000 units CAPS Take 1 capsule by mouth daily.    Marland Kitchen doxylamine, Sleep, (UNISOM) 25 MG tablet Take 25 mg by mouth at bedtime as needed.    . gabapentin (NEURONTIN) 300 MG capsule Take 2 capsules (600 mg total) by mouth 3 (three) times daily. 180 capsule 6  . Melatonin 5 MG CAPS Take 1 capsule by mouth at bedtime as needed.    . Turmeric 500 MG CAPS Take 1 capsule by mouth daily.     No current facility-administered medications for this visit.     SURGICAL HISTORY:  Past Surgical History:  Procedure Laterality Date  . CARPAL TUNNEL RELEASE    . CHOLECYSTECTOMY    . left knee surgery    . PARTIAL HYSTERECTOMY      REVIEW OF SYSTEMS:  A comprehensive review of systems was negative  except for: Respiratory: positive for cough and dyspnea on exertion   PHYSICAL EXAMINATION: General appearance: alert, cooperative and no distress Head: Normocephalic, without obvious abnormality, atraumatic Neck: no adenopathy, no JVD, supple, symmetrical, trachea midline and thyroid not enlarged, symmetric, no tenderness/mass/nodules Lymph nodes: Cervical, supraclavicular, and axillary nodes normal. Resp: wheezes bilaterally Back: symmetric, no curvature. ROM normal. No CVA tenderness. Cardio: regular rate and rhythm, S1, S2 normal, no murmur, click, rub or gallop GI: soft,  non-tender; bowel sounds normal; no masses,  no organomegaly Extremities: extremities normal, atraumatic, no cyanosis or edema  ECOG PERFORMANCE STATUS: 1 - Symptomatic but completely ambulatory  Blood pressure 114/63, pulse 91, temperature 98.4 F (36.9 C), temperature source Oral, resp. rate 20, height 5\' 2"  (1.575 m), weight 170 lb 4.8 oz (77.2 kg), SpO2 97 %.  LABORATORY DATA: Lab Results  Component Value Date   WBC 11.0 (H) 08/19/2017   HGB 16.4 (H) 08/19/2017   HCT 49.0 (H) 08/19/2017   MCV 93.9 08/19/2017   PLT 198 08/19/2017      Chemistry      Component Value Date/Time   NA 142 08/19/2017 1513   NA 141 12/23/2016 0856   K 4.7 08/19/2017 1513   K 4.3 12/23/2016 0856   CL 105 08/19/2017 1513   CL 106 10/06/2012 1303   CO2 26 08/19/2017 1513   CO2 25 12/23/2016 0856   BUN 10 08/19/2017 1513   BUN 20.6 12/23/2016 0856   CREATININE 0.83 08/19/2017 1513   CREATININE 0.9 12/23/2016 0856      Component Value Date/Time   CALCIUM 10.1 08/19/2017 1513   CALCIUM 10.0 12/23/2016 0856   ALKPHOS 90 08/19/2017 1513   ALKPHOS 83 12/23/2016 0856   AST 14 08/19/2017 1513   AST 12 12/23/2016 0856   ALT 12 08/19/2017 1513   ALT 10 12/23/2016 0856   BILITOT 0.5 08/19/2017 1513   BILITOT 0.42 12/23/2016 0856       RADIOGRAPHIC STUDIES: Ct Chest W Contrast  Result Date: 08/19/2017 CLINICAL DATA:  Restaging lung cancer. EXAM: CT CHEST, ABDOMEN, AND PELVIS WITH CONTRAST TECHNIQUE: Multidetector CT imaging of the chest, abdomen and pelvis was performed following the standard protocol during bolus administration of intravenous contrast. CONTRAST:  173mL ISOVUE-300 IOPAMIDOL (ISOVUE-300) INJECTION 61% COMPARISON:  Multiple CT scans from 2018. The most recent is 12/23/2016. FINDINGS: CT CHEST FINDINGS Cardiovascular: The heart is normal in size and stable. There is a stable moderate-sized pericardial effusion. The aorta is normal in caliber. Stable atherosclerotic calcifications but no  dissection. Stable three-vessel coronary artery calcifications. Mediastinum/Nodes: Stable mediastinal and right hilar lymph nodes. 7 mm pretracheal lymph node on image number 21. Stable small lymph nodes anterior to the trachea on image 15. Stable 10 mm lymph node in the right hilum adjacent to the right pulmonary artery on image number 27. Stable right suprahilar lymph node on image number 24 measuring 11.5 mm. The esophagus is grossly normal. Lungs/Pleura: Stable severe emphysematous changes and pulmonary scarring. Stable 5 mm sub solid nodule in the left upper lobe on image number 44. Stable 5 mm subpleural right upper lobe pulmonary nodule on image number 38. Stable dense masslike radiation changes in both lungs. No obvious findings for recurrent tumor and no new metastatic pulmonary lesions. Musculoskeletal: No significant bony findings. Stable remote ununited rib fractures on the right side posteriorly. No new pulmonary lesions or acute findings. CT ABDOMEN PELVIS FINDINGS Hepatobiliary: No focal hepatic lesions or intrahepatic biliary dilatation. The gallbladder is surgically absent.  No common bile duct dilatation. Pancreas: No mass, inflammation or ductal dilatation. 8 mm cyst noted in the pancreatic body (series 2, image 54) is stable and likely a postinflammatory cyst. Spleen: Normal size.  No focal lesions. Adrenals/Urinary Tract: The adrenal glands and kidneys are unremarkable and stable. No evidence of adrenal gland metastasis. Stomach/Bowel: The stomach, duodenum, small bowel and colon are unremarkable. No acute inflammatory changes, mass lesions or obstructive findings. The terminal ileum is normal. The appendix is normal. Vascular/Lymphatic: Stable abdominal aortic aneurysm with aortoiliac stent graft in place. No complicating features are identified. The branch vessels are patent. The major venous structures are patent. No mesenteric or retroperitoneal mass or adenopathy. Reproductive: The uterus is  surgically absent. Both ovaries are still present. There is a stable nodular soft tissue lesion near the right vaginal fornix measuring 2.5 cm. This could part of the right ovary. It has not changed since the prior studies dating back to 2017. Other: No inguinal mass or adenopathy. Small bilateral inguinal hernias containing fat. Musculoskeletal: No significant bony findings. No evidence of osseous metastatic disease. IMPRESSION: 1. Stable radiation changes involving both lungs without findings for recurrent tumor. 2. Stable small pulmonary nodules bilaterally. No new pulmonary lesions. 3. Advanced emphysematous changes and pulmonary scarring. 4. Stable borderline mediastinal and right hilar lymph nodes. No progressive findings. 5. No evidence of metastatic disease involving the abdomen/pelvis or bony structures. 6. Stable 2.5 cm right-sided pelvic lesion, possibly part of the right ovary. 7. Stable abdominal aortic aneurysm and aortoiliac stent graft. 8. Stable moderate-sized pericardial effusion. Electronically Signed   By: Marijo Sanes M.D.   On: 08/19/2017 17:12   Ct Abdomen Pelvis W Contrast  Result Date: 08/19/2017 CLINICAL DATA:  Restaging lung cancer. EXAM: CT CHEST, ABDOMEN, AND PELVIS WITH CONTRAST TECHNIQUE: Multidetector CT imaging of the chest, abdomen and pelvis was performed following the standard protocol during bolus administration of intravenous contrast. CONTRAST:  137mL ISOVUE-300 IOPAMIDOL (ISOVUE-300) INJECTION 61% COMPARISON:  Multiple CT scans from 2018. The most recent is 12/23/2016. FINDINGS: CT CHEST FINDINGS Cardiovascular: The heart is normal in size and stable. There is a stable moderate-sized pericardial effusion. The aorta is normal in caliber. Stable atherosclerotic calcifications but no dissection. Stable three-vessel coronary artery calcifications. Mediastinum/Nodes: Stable mediastinal and right hilar lymph nodes. 7 mm pretracheal lymph node on image number 21. Stable small  lymph nodes anterior to the trachea on image 15. Stable 10 mm lymph node in the right hilum adjacent to the right pulmonary artery on image number 27. Stable right suprahilar lymph node on image number 24 measuring 11.5 mm. The esophagus is grossly normal. Lungs/Pleura: Stable severe emphysematous changes and pulmonary scarring. Stable 5 mm sub solid nodule in the left upper lobe on image number 44. Stable 5 mm subpleural right upper lobe pulmonary nodule on image number 38. Stable dense masslike radiation changes in both lungs. No obvious findings for recurrent tumor and no new metastatic pulmonary lesions. Musculoskeletal: No significant bony findings. Stable remote ununited rib fractures on the right side posteriorly. No new pulmonary lesions or acute findings. CT ABDOMEN PELVIS FINDINGS Hepatobiliary: No focal hepatic lesions or intrahepatic biliary dilatation. The gallbladder is surgically absent. No common bile duct dilatation. Pancreas: No mass, inflammation or ductal dilatation. 8 mm cyst noted in the pancreatic body (series 2, image 54) is stable and likely a postinflammatory cyst. Spleen: Normal size.  No focal lesions. Adrenals/Urinary Tract: The adrenal glands and kidneys are unremarkable and stable. No evidence of adrenal  gland metastasis. Stomach/Bowel: The stomach, duodenum, small bowel and colon are unremarkable. No acute inflammatory changes, mass lesions or obstructive findings. The terminal ileum is normal. The appendix is normal. Vascular/Lymphatic: Stable abdominal aortic aneurysm with aortoiliac stent graft in place. No complicating features are identified. The branch vessels are patent. The major venous structures are patent. No mesenteric or retroperitoneal mass or adenopathy. Reproductive: The uterus is surgically absent. Both ovaries are still present. There is a stable nodular soft tissue lesion near the right vaginal fornix measuring 2.5 cm. This could part of the right ovary. It has not  changed since the prior studies dating back to 2017. Other: No inguinal mass or adenopathy. Small bilateral inguinal hernias containing fat. Musculoskeletal: No significant bony findings. No evidence of osseous metastatic disease. IMPRESSION: 1. Stable radiation changes involving both lungs without findings for recurrent tumor. 2. Stable small pulmonary nodules bilaterally. No new pulmonary lesions. 3. Advanced emphysematous changes and pulmonary scarring. 4. Stable borderline mediastinal and right hilar lymph nodes. No progressive findings. 5. No evidence of metastatic disease involving the abdomen/pelvis or bony structures. 6. Stable 2.5 cm right-sided pelvic lesion, possibly part of the right ovary. 7. Stable abdominal aortic aneurysm and aortoiliac stent graft. 8. Stable moderate-sized pericardial effusion. Electronically Signed   By: Marijo Sanes M.D.   On: 08/19/2017 17:12   ASSESSMENT AND PLAN:  This is a very pleasant 67 years old white female with metastatic non-small cell lung cancer status post stereotactic radiotherapy to the brain as well as a course of concurrent chemoradiation followed by systemic chemotherapy with carboplatin and Alimta. The patient is current on observation for the last 6 years and she is feeling fine. Repeat CT scan of the chest, abdomen and pelvis showed no concerning findings for disease progression. I discussed the scan results with the patient and recommended for her to continue on observation with repeat CT scan of the chest, abdomen and pelvis in 6 months. For smoking cessation, I strongly encouraged the patient to quit smoking.  She is not willing to quit. She was advised to call immediately if she has any concerning symptoms in the interval. The patient voices understanding of current disease status and treatment options and is in agreement with the current care plan. All questions were answered. The patient knows to call the clinic with any problems, questions  or concerns. We can certainly see the patient much sooner if necessary. I spent 10 minutes counseling the patient face to face. The total time spent in the appointment was 15 minutes. Disclaimer: This note was dictated with voice recognition software. Similar sounding words can inadvertently be transcribed and may not be corrected upon review.

## 2017-09-01 ENCOUNTER — Encounter: Payer: Self-pay | Admitting: *Deleted

## 2017-09-01 NOTE — Progress Notes (Signed)
[place] called "stated she wants to have a follow up appointment with Dr. Tammi Klippel in the future because she hasn't seen him in a while she will be back in February 25, 2018 to see Dr. Julien Nordmann and hopes she can see Dr. Tammi Klippel that same day."  Reported she has not been having headaches and is taking Advil and there has been no change in her memory since her last visit in Radiation oncology. I replied that I would make Dr. Tammi Klippel, Freeman Caldron, PA-C and Mont Dutton, RPT aware of her request and get her an appointment scheduled.

## 2017-09-21 DIAGNOSIS — C3490 Malignant neoplasm of unspecified part of unspecified bronchus or lung: Secondary | ICD-10-CM | POA: Diagnosis not present

## 2017-10-21 DIAGNOSIS — C3490 Malignant neoplasm of unspecified part of unspecified bronchus or lung: Secondary | ICD-10-CM | POA: Diagnosis not present

## 2017-11-21 DIAGNOSIS — C3490 Malignant neoplasm of unspecified part of unspecified bronchus or lung: Secondary | ICD-10-CM | POA: Diagnosis not present

## 2017-12-08 DIAGNOSIS — J449 Chronic obstructive pulmonary disease, unspecified: Secondary | ICD-10-CM | POA: Diagnosis not present

## 2017-12-08 DIAGNOSIS — C7931 Secondary malignant neoplasm of brain: Secondary | ICD-10-CM | POA: Diagnosis not present

## 2017-12-08 DIAGNOSIS — C3490 Malignant neoplasm of unspecified part of unspecified bronchus or lung: Secondary | ICD-10-CM | POA: Diagnosis not present

## 2017-12-08 DIAGNOSIS — Z85841 Personal history of malignant neoplasm of brain: Secondary | ICD-10-CM | POA: Diagnosis not present

## 2017-12-08 DIAGNOSIS — E559 Vitamin D deficiency, unspecified: Secondary | ICD-10-CM | POA: Diagnosis not present

## 2017-12-08 DIAGNOSIS — M85852 Other specified disorders of bone density and structure, left thigh: Secondary | ICD-10-CM | POA: Diagnosis not present

## 2017-12-08 DIAGNOSIS — Z79899 Other long term (current) drug therapy: Secondary | ICD-10-CM | POA: Diagnosis not present

## 2018-02-23 ENCOUNTER — Encounter (HOSPITAL_COMMUNITY): Payer: Self-pay | Admitting: Radiology

## 2018-02-23 ENCOUNTER — Inpatient Hospital Stay: Payer: Medicare Other | Attending: Internal Medicine

## 2018-02-23 ENCOUNTER — Ambulatory Visit (HOSPITAL_COMMUNITY)
Admission: RE | Admit: 2018-02-23 | Discharge: 2018-02-23 | Disposition: A | Discharge status: Home / Self Care | Payer: Medicare Other | Source: Ambulatory Visit | Attending: Internal Medicine | Admitting: Internal Medicine

## 2018-02-23 DIAGNOSIS — C3490 Malignant neoplasm of unspecified part of unspecified bronchus or lung: Secondary | ICD-10-CM | POA: Diagnosis present

## 2018-02-23 DIAGNOSIS — I10 Essential (primary) hypertension: Secondary | ICD-10-CM | POA: Diagnosis not present

## 2018-02-23 DIAGNOSIS — J449 Chronic obstructive pulmonary disease, unspecified: Secondary | ICD-10-CM | POA: Diagnosis not present

## 2018-02-23 DIAGNOSIS — E785 Hyperlipidemia, unspecified: Secondary | ICD-10-CM | POA: Diagnosis not present

## 2018-02-23 DIAGNOSIS — Z923 Personal history of irradiation: Secondary | ICD-10-CM | POA: Insufficient documentation

## 2018-02-23 DIAGNOSIS — Z79899 Other long term (current) drug therapy: Secondary | ICD-10-CM | POA: Diagnosis not present

## 2018-02-23 DIAGNOSIS — Z9221 Personal history of antineoplastic chemotherapy: Secondary | ICD-10-CM | POA: Insufficient documentation

## 2018-02-23 DIAGNOSIS — Z8673 Personal history of transient ischemic attack (TIA), and cerebral infarction without residual deficits: Secondary | ICD-10-CM | POA: Insufficient documentation

## 2018-02-23 DIAGNOSIS — Z85841 Personal history of malignant neoplasm of brain: Secondary | ICD-10-CM | POA: Diagnosis not present

## 2018-02-23 DIAGNOSIS — F17210 Nicotine dependence, cigarettes, uncomplicated: Secondary | ICD-10-CM | POA: Insufficient documentation

## 2018-02-23 DIAGNOSIS — Z85118 Personal history of other malignant neoplasm of bronchus and lung: Secondary | ICD-10-CM | POA: Diagnosis not present

## 2018-02-23 LAB — CMP (CANCER CENTER ONLY)
ALK PHOS: 85 U/L (ref 38–126)
ALT: 9 U/L (ref 0–44)
AST: 11 U/L — ABNORMAL LOW (ref 15–41)
Albumin: 3.7 g/dL (ref 3.5–5.0)
Anion gap: 10 (ref 5–15)
BUN: 11 mg/dL (ref 8–23)
CALCIUM: 9.7 mg/dL (ref 8.9–10.3)
CO2: 29 mmol/L (ref 22–32)
CREATININE: 0.88 mg/dL (ref 0.44–1.00)
Chloride: 105 mmol/L (ref 98–111)
GFR, Estimated: >60 mL/min (ref >60)
Glucose, Bld: 93 mg/dL (ref 70–99)
Potassium: 4.6 mmol/L (ref 3.5–5.1)
Sodium: 144 mmol/L (ref 135–145)
Total Bilirubin: 0.3 mg/dL (ref 0.3–1.2)
Total Protein: 7.2 g/dL (ref 6.5–8.1)

## 2018-02-23 LAB — CBC WITH DIFFERENTIAL (CANCER CENTER ONLY)
BASOS PCT: 1 %
Basophils Absolute: 0.1 10*3/uL (ref 0.0–0.1)
EOS ABS: 0.2 10*3/uL (ref 0.0–0.5)
EOS PCT: 2 %
HCT: 48 % — ABNORMAL HIGH (ref 34.8–46.6)
Hemoglobin: 15.9 g/dL (ref 11.6–15.9)
Lymphocytes Relative: 31 %
Lymphs Abs: 2.9 10*3/uL (ref 0.9–3.3)
MCH: 31.9 pg (ref 25.1–34.0)
MCHC: 33.1 g/dL (ref 31.5–36.0)
MCV: 96.4 fL (ref 79.5–101.0)
MONO ABS: 0.5 10*3/uL (ref 0.1–0.9)
MONOS PCT: 5 %
Neutro Abs: 5.8 10*3/uL (ref 1.5–6.5)
Neutrophils Relative %: 61 %
PLATELETS: 191 10*3/uL (ref 145–400)
RBC: 4.98 MIL/uL (ref 3.70–5.45)
RDW: 13.4 % (ref 11.2–14.5)
WBC Count: 9.4 10*3/uL (ref 3.9–10.3)

## 2018-02-23 MED ORDER — IOHEXOL 300 MG/ML  SOLN
100.0000 mL | Freq: Once | INTRAMUSCULAR | Status: AC | PRN
  Administered 2018-02-23: 100 mL via INTRAVENOUS

## 2018-02-25 ENCOUNTER — Ambulatory Visit
Admission: RE | Admit: 2018-02-25 | Discharge: 2018-02-25 | Disposition: A | Discharge status: Home / Self Care | Payer: Medicare Other | Source: Ambulatory Visit | Attending: Radiation Oncology | Admitting: Radiation Oncology

## 2018-02-25 ENCOUNTER — Encounter: Payer: Self-pay | Admitting: Internal Medicine

## 2018-02-25 ENCOUNTER — Inpatient Hospital Stay (HOSPITAL_BASED_OUTPATIENT_CLINIC_OR_DEPARTMENT_OTHER): Payer: Medicare Other | Admitting: Internal Medicine

## 2018-02-25 ENCOUNTER — Inpatient Hospital Stay: Payer: Medicare Other

## 2018-02-25 ENCOUNTER — Encounter: Payer: Self-pay | Admitting: Radiation Oncology

## 2018-02-25 ENCOUNTER — Other Ambulatory Visit: Payer: Self-pay

## 2018-02-25 ENCOUNTER — Telehealth: Payer: Self-pay | Admitting: Internal Medicine

## 2018-02-25 ENCOUNTER — Encounter: Payer: Self-pay | Admitting: *Deleted

## 2018-02-25 VITALS — BP 140/74 | HR 88 | Temp 98.3°F | Resp 24 | Wt 177.6 lb

## 2018-02-25 VITALS — BP 120/74 | HR 92 | Temp 98.7°F | Resp 18 | Ht 62.0 in | Wt 179.6 lb

## 2018-02-25 DIAGNOSIS — Z85118 Personal history of other malignant neoplasm of bronchus and lung: Secondary | ICD-10-CM | POA: Diagnosis not present

## 2018-02-25 DIAGNOSIS — Z880 Allergy status to penicillin: Secondary | ICD-10-CM | POA: Diagnosis not present

## 2018-02-25 DIAGNOSIS — Z882 Allergy status to sulfonamides status: Secondary | ICD-10-CM | POA: Insufficient documentation

## 2018-02-25 DIAGNOSIS — C7949 Secondary malignant neoplasm of other parts of nervous system: Secondary | ICD-10-CM

## 2018-02-25 DIAGNOSIS — Z923 Personal history of irradiation: Secondary | ICD-10-CM | POA: Diagnosis not present

## 2018-02-25 DIAGNOSIS — Z885 Allergy status to narcotic agent status: Secondary | ICD-10-CM | POA: Diagnosis not present

## 2018-02-25 DIAGNOSIS — Z85841 Personal history of malignant neoplasm of brain: Secondary | ICD-10-CM | POA: Diagnosis not present

## 2018-02-25 DIAGNOSIS — Z9221 Personal history of antineoplastic chemotherapy: Secondary | ICD-10-CM | POA: Diagnosis not present

## 2018-02-25 DIAGNOSIS — Z720 Tobacco use: Secondary | ICD-10-CM

## 2018-02-25 DIAGNOSIS — J449 Chronic obstructive pulmonary disease, unspecified: Secondary | ICD-10-CM | POA: Insufficient documentation

## 2018-02-25 DIAGNOSIS — F17210 Nicotine dependence, cigarettes, uncomplicated: Secondary | ICD-10-CM | POA: Diagnosis not present

## 2018-02-25 DIAGNOSIS — C7931 Secondary malignant neoplasm of brain: Secondary | ICD-10-CM | POA: Insufficient documentation

## 2018-02-25 DIAGNOSIS — C3491 Malignant neoplasm of unspecified part of right bronchus or lung: Secondary | ICD-10-CM

## 2018-02-25 DIAGNOSIS — C3490 Malignant neoplasm of unspecified part of unspecified bronchus or lung: Secondary | ICD-10-CM

## 2018-02-25 DIAGNOSIS — Z79899 Other long term (current) drug therapy: Secondary | ICD-10-CM | POA: Diagnosis not present

## 2018-02-25 LAB — RESEARCH LABS

## 2018-02-25 NOTE — Addendum Note (Signed)
Addended by: Tania Ade on: 02/25/2018 12:01 PM   Modules accepted: Orders

## 2018-02-25 NOTE — Telephone Encounter (Signed)
Appts scheduled AVS/Calendar printed/ contrast provided   W/ phone number for central radiology per 9/5 los

## 2018-02-25 NOTE — Progress Notes (Signed)
[place] [phone]   [phone]  OFFICE PROGRESS NOTE  [address], [address] [place] [address]  DIAGNOSIS: Metastatic non-small cell lung cancer, favoring adenocarcinoma diagnosed in July of 2012, presented with locally advanced disease in the chest as well as brain metastasis. Veristrat test poor.   PRIOR THERAPY:  1. Status post stereotactic radiotherapy to 2 brain lesions under the care of Dr. Lisbeth Renshaw. 2. Status post a course of concurrent chemoradiation with weekly carboplatin and paclitaxel, last dose of chemotherapy was given 03/05/2011. 3. Systemic chemotherapy with carboplatin for AUC of 5 and Alimta 500 mg/M2. The patient is status post 3 cycles. Last dose was given 06/03/2011  CURRENT THERAPY: Observation.  INTERVAL HISTORY: Courtney West 68 y.o. female returns to the clinic today for six-month follow-up visit.  The patient is feeling fine today with no concerning complaints.  She denied having any chest pain, shortness breath, cough or hemoptysis.  She denied having any fever or chills.  She has no nausea, vomiting, diarrhea or constipation.  Unfortunately she continues to smoke and not willing to quit.  MEDICAL HISTORY: Past Medical History:  Diagnosis Date  . Abdominal aortic aneurysm ([place])   . Allergy    codeine,pcn,sulfa drugs  . Brain cancer ([facility])    mets from lung primary  . Clotted vascular catheter ([facility]) 03/30/2012  . COPD (chronic obstructive pulmonary disease) ([facility])   . Dyslipidemia   . History of radiation therapy 01/19/12, 01/21/12, 01/26/12   RLL lung  . History of radiation therapy 04/18/11   single fraction to 6 brain mets  . Hypertension   . Lung cancer ([place]) 12/2010  . Lung cancer ([facility]) 12/22/2010  . Radiation 10/20/2011   frontal mets/Palliation  . S/P radiation therapy 7/12 thru 9/12, 04/18/11   xrt to brain mets  . Stroke [facility])     ALLERGIES:  is allergic to sulfa antibiotics;  carboplatin; codeine; dilaudid [hydromorphone hcl]; and penicillins.  MEDICATIONS:  Current Outpatient Medications  Medication Sig Dispense Refill  . budesonide-formoterol (SYMBICORT) 80-4.5 MCG/ACT inhaler Inhale 2 puffs into the lungs 2 (two) times daily.    . Cholecalciferol 2000 units CAPS Take 1 capsule by mouth daily.    Marland Kitchen doxylamine, Sleep, (UNISOM) 25 MG tablet Take 25 mg by mouth at bedtime as needed.    . gabapentin (NEURONTIN) 300 MG capsule Take 2 capsules (600 mg total) by mouth 3 (three) times daily. 180 capsule 6  . Melatonin 5 MG CAPS Take 1 capsule by mouth at bedtime as needed.    . Turmeric 500 MG CAPS Take 1 capsule by mouth daily.     No current facility-administered medications for this visit.     SURGICAL HISTORY:  Past Surgical History:  Procedure Laterality Date  . CARPAL TUNNEL RELEASE    . CHOLECYSTECTOMY    . left knee surgery    . PARTIAL HYSTERECTOMY      REVIEW OF SYSTEMS:  A comprehensive review of systems was negative except for: Respiratory: positive for dyspnea on exertion   PHYSICAL EXAMINATION: General appearance: alert, cooperative and no distress Head: Normocephalic, without obvious abnormality, atraumatic Neck: no adenopathy, no JVD, supple, symmetrical, trachea midline and thyroid not enlarged, symmetric, no tenderness/mass/nodules Lymph nodes: Cervical, supraclavicular, and axillary nodes normal. Resp: wheezes bilaterally Back: symmetric, no curvature. ROM normal. No CVA tenderness. Cardio: regular rate and rhythm, S1, S2 normal, no murmur, click, rub or gallop GI: soft, non-tender;  bowel sounds normal; no masses,  no organomegaly Extremities: extremities normal, atraumatic, no cyanosis or edema  ECOG PERFORMANCE STATUS: 1 - Symptomatic but completely ambulatory  Blood pressure 120/74, pulse 92, temperature 98.7 F (37.1 C), temperature source Oral, resp. rate 18, height 5\' 2"  (1.575 m), weight 179 lb 9.6 oz (81.5 kg), SpO2 93  %.  LABORATORY DATA: Lab Results  Component Value Date   WBC 9.4 02/23/2018   HGB 15.9 02/23/2018   HCT 48.0 (H) 02/23/2018   MCV 96.4 02/23/2018   PLT 191 02/23/2018      Chemistry      Component Value Date/Time   NA 144 02/23/2018 1413   NA 141 12/23/2016 0856   K 4.6 02/23/2018 1413   K 4.3 12/23/2016 0856   CL 105 02/23/2018 1413   CL 106 10/06/2012 1303   CO2 29 02/23/2018 1413   CO2 25 12/23/2016 0856   BUN 11 02/23/2018 1413   BUN 20.6 12/23/2016 0856   CREATININE 0.88 02/23/2018 1413   CREATININE 0.9 12/23/2016 0856      Component Value Date/Time   CALCIUM 9.7 02/23/2018 1413   CALCIUM 10.0 12/23/2016 0856   ALKPHOS 85 02/23/2018 1413   ALKPHOS 83 12/23/2016 0856   AST 11 (L) 02/23/2018 1413   AST 12 12/23/2016 0856   ALT 9 02/23/2018 1413   ALT 10 12/23/2016 0856   BILITOT 0.3 02/23/2018 1413   BILITOT 0.42 12/23/2016 0856       RADIOGRAPHIC STUDIES: Ct Chest W Contrast  Result Date: 02/24/2018 CLINICAL DATA:  Non-small cell lung cancer, chemotherapy and XRT complete EXAM: CT CHEST, ABDOMEN, AND PELVIS WITH CONTRAST TECHNIQUE: Multidetector CT imaging of the chest, abdomen and pelvis was performed following the standard protocol during bolus administration of intravenous contrast. CONTRAST:  14mL OMNIPAQUE IOHEXOL 300 MG/ML  SOLN COMPARISON:  08/19/2017 FINDINGS: CT CHEST FINDINGS Cardiovascular: Heart is normal in size. Large pericardial effusion. No evidence of thoracic aortic aneurysm. Atherosclerotic calcifications of the aortic arch. Three vessel coronary atherosclerosis. Right chest port terminates in the lower SVC. Mediastinum/Nodes: Small mediastinal lymph nodes including a 9 mm short axis right paratracheal node, previously 7 mm. 11 mm short axis right hilar node (series 2/image 25), unchanged. Additional 13 mm short axis right perihilar node (series 2/image 26), previously 12 mm. Visualized thyroid is unremarkable. Lungs/Pleura: Radiation changes in  the central right lower lobe (series 4/image 89). Radiation changes in the left paramediastinal/infrahilar region (series 4/image 71). Moderate centrilobular and paraseptal emphysematous changes, upper lobe predominant. 8 x 3 mm linear nodule in the anterior left upper lobe (series 4/image 47), unchanged. 6 x 4 mm subpleural nodule in the posterior right upper lobe (series 4/image 42), unchanged. No focal consolidation. Trace right pleural effusion.  No pneumothorax. Musculoskeletal: Stable right posterior 7th and 8th rib fracture deformities. Thoracic spine is within normal limits. CT ABDOMEN PELVIS FINDINGS Hepatobiliary: Liver is within normal limits. Status post cholecystectomy. No intrahepatic or extrahepatic ductal dilatation. Pancreas: 10 mm cyst along the superior aspect of the pancreatic body (series 2/image 55), grossly unchanged. Otherwise within normal limits. Spleen: Lobular spleen with lateral calcifications. Adrenals/Urinary Tract: Adrenal glands are within normal limits. Kidneys are within normal limits.  No hydronephrosis. Bladder is within normal limits. Stomach/Bowel: Stomach is within normal limits. No evidence of bowel obstruction. Normal appendix (series 2/image 78). Vascular/Lymphatic: 4.5 cm abdominal aortic aneurysm with aorto bi-iliac stent. Atherosclerotic calcifications the abdominal aorta and branch vessels. No suspicious abdominopelvic lymphadenopathy. Reproductive: Status post hysterectomy.  Bilateral ovaries are unremarkable. Stable 2.7 cm soft tissue lesion in the right pelvic cul-de-sac (series 2/image 98), chronic. Other: No abdominopelvic ascites. Minimal fat in the bilateral inguinal canals. Musculoskeletal: Moderate compression fracture deformities at L2 and L4, chronic, with mild degenerative changes of the lumbar spine. IMPRESSION: Radiation changes in the left paramediastinal region and right lower lobe. Mild mediastinal and right hilar lymphadenopathy, stable versus mildly  increased. Nodal recurrence is not excluded. Consider attention on follow-up versus PET-CT. Otherwise, no findings suspicious for metastatic disease. Small bilateral pulmonary nodules are unchanged, benign. Additional stable ancillary findings as above. Electronically Signed   By: Julian Hy M.D.   On: 02/24/2018 09:02   Ct Abdomen Pelvis W Contrast  Result Date: 02/24/2018 CLINICAL DATA:  Non-small cell lung cancer, chemotherapy and XRT complete EXAM: CT CHEST, ABDOMEN, AND PELVIS WITH CONTRAST TECHNIQUE: Multidetector CT imaging of the chest, abdomen and pelvis was performed following the standard protocol during bolus administration of intravenous contrast. CONTRAST:  187mL OMNIPAQUE IOHEXOL 300 MG/ML  SOLN COMPARISON:  08/19/2017 FINDINGS: CT CHEST FINDINGS Cardiovascular: Heart is normal in size. Large pericardial effusion. No evidence of thoracic aortic aneurysm. Atherosclerotic calcifications of the aortic arch. Three vessel coronary atherosclerosis. Right chest port terminates in the lower SVC. Mediastinum/Nodes: Small mediastinal lymph nodes including a 9 mm short axis right paratracheal node, previously 7 mm. 11 mm short axis right hilar node (series 2/image 25), unchanged. Additional 13 mm short axis right perihilar node (series 2/image 26), previously 12 mm. Visualized thyroid is unremarkable. Lungs/Pleura: Radiation changes in the central right lower lobe (series 4/image 89). Radiation changes in the left paramediastinal/infrahilar region (series 4/image 71). Moderate centrilobular and paraseptal emphysematous changes, upper lobe predominant. 8 x 3 mm linear nodule in the anterior left upper lobe (series 4/image 47), unchanged. 6 x 4 mm subpleural nodule in the posterior right upper lobe (series 4/image 42), unchanged. No focal consolidation. Trace right pleural effusion.  No pneumothorax. Musculoskeletal: Stable right posterior 7th and 8th rib fracture deformities. Thoracic spine is within  normal limits. CT ABDOMEN PELVIS FINDINGS Hepatobiliary: Liver is within normal limits. Status post cholecystectomy. No intrahepatic or extrahepatic ductal dilatation. Pancreas: 10 mm cyst along the superior aspect of the pancreatic body (series 2/image 55), grossly unchanged. Otherwise within normal limits. Spleen: Lobular spleen with lateral calcifications. Adrenals/Urinary Tract: Adrenal glands are within normal limits. Kidneys are within normal limits.  No hydronephrosis. Bladder is within normal limits. Stomach/Bowel: Stomach is within normal limits. No evidence of bowel obstruction. Normal appendix (series 2/image 78). Vascular/Lymphatic: 4.5 cm abdominal aortic aneurysm with aorto bi-iliac stent. Atherosclerotic calcifications the abdominal aorta and branch vessels. No suspicious abdominopelvic lymphadenopathy. Reproductive: Status post hysterectomy. Bilateral ovaries are unremarkable. Stable 2.7 cm soft tissue lesion in the right pelvic cul-de-sac (series 2/image 98), chronic. Other: No abdominopelvic ascites. Minimal fat in the bilateral inguinal canals. Musculoskeletal: Moderate compression fracture deformities at L2 and L4, chronic, with mild degenerative changes of the lumbar spine. IMPRESSION: Radiation changes in the left paramediastinal region and right lower lobe. Mild mediastinal and right hilar lymphadenopathy, stable versus mildly increased. Nodal recurrence is not excluded. Consider attention on follow-up versus PET-CT. Otherwise, no findings suspicious for metastatic disease. Small bilateral pulmonary nodules are unchanged, benign. Additional stable ancillary findings as above. Electronically Signed   By: Julian Hy M.D.   On: 02/24/2018 09:02   ASSESSMENT AND PLAN:  This is a very pleasant 67 years old white female with metastatic non-small cell lung cancer status  post stereotactic radiotherapy to the brain as well as a course of concurrent chemoradiation followed by systemic  chemotherapy with carboplatin and Alimta. She is currently on observation and she is feeling fine. The patient had a repeat CT scan of the chest, abdomen and pelvis performed recently. I personally and independently reviewed the scans and discussed the results with the patient today.  Her scan showed no concerning findings for disease progression. I recommended for the patient to continue on observation with repeat CT scan of the chest, abdomen and pelvis in 6 months for restaging of her disease. She was advised to call immediately if she has any concerning symptoms in the interval. The patient voices understanding of current disease status and treatment options and is in agreement with the current care plan. All questions were answered. The patient knows to call the clinic with any problems, questions or concerns. We can certainly see the patient much sooner if necessary. I spent 10 minutes counseling the patient face to face. The total time spent in the appointment was 15 minutes. Disclaimer: This note was dictated with voice recognition software. Similar sounding words can inadvertently be transcribed and may not be corrected upon review.

## 2018-02-25 NOTE — Progress Notes (Signed)
Radiation Oncology         [phone] ________________________________  Name: Courtney West MRN: [id number]  Date: 02/25/2018  DOB: [birth year]  Follow-Up Visit Note  CC: Glendale Chard, MD  Shirline Frees, MD  Diagnosis:   67 y.o. woman with a history of brain metastases from right lung cancer  Interval Since Last Radiation:  6 years 1 month  1. SRS to a 10-mm left frontal and 5-mm right frontal brain metastasis to 20 Gy on 01/16/2011.  2. The patient's primary lung cancer was treated to 66 Gy in 33 fractions of 2 Gy through 03/10/2011.   3. SRS to 6 new brain metastases (2 adjacent left temporal brain metastases, a 4.7-mm vermis target, a 3.4-mm right frontal brain metastasis, a left parietal 5.3-mm brain metastasis, and a 6.6-mm left cerebellar brain metastasis) received 20 Gy in a single fraction on 04/18/2011.  4. SRS to a 4 mm posterior frontal target to 20 Gy on 10/20/2011.  5. SBRT to a 1.6 cm metachronous clinical stage IA primary cancer of the right lower lung on 01/19/2012, 01/21/2012, and 01/26/2012 to 54 Gy in 3 fractions of 18 Gy.  Narrative: Courtney West is a pleasant patient with a history of a right lung cancer who comes today for follow-up. She is status post radiotherapy to the right lung and several brain metastases. She's been followed closely by Dr. Julien Nordmann since her treatment. She is currently on observation and is doing well. Most recent CT CAP dated 02/23/2018 showed mild mediastinal and right hilar lymphadenopathy, stable versus mildly increased. Otherwise, no findings suspicious for metastatic disease. Small bilateral pulmonary nodules are unchanged, benign. Her last brain MRI from 10/14/2016 showed slight increase in size of the superior vermis metastasis measuring 4 mm. Other previously identified metastases in the brain are stable, decreased in size, or no longer enhance compatible with posttreatment response. No new intracranial metastasis identified.  On review of systems,  the patient denies any pain. She reports a productive cough but is unsure of the color of sputum. She reports shortness of breath, worse with exertion. She denies any pain or difficulty associated with swallowing. She reports headaches related to stress as she is dealing with the loss of her son and other family members. She denies tinnitus, diplopia, nausea, or vomiting. She denies any recent falls.   Past Medical History:  Past Medical History:  Diagnosis Date  . Abdominal aortic aneurysm ([place])   . Allergy    codeine,pcn,sulfa drugs  . Brain cancer ([place])    mets from lung primary  . Clotted vascular catheter ([place]) 03/30/2012  . COPD (chronic obstructive pulmonary disease) ([place])   . Dyslipidemia   . History of radiation therapy 01/19/12, 01/21/12, 01/26/12   RLL lung  . History of radiation therapy 04/18/11   single fraction to 6 brain mets  . Hypertension   . Lung cancer ([place]) 12/2010  . Lung cancer ([place]) 12/22/2010  . Radiation 10/20/2011   frontal mets/Palliation  . S/P radiation therapy 7/12 thru 9/12, 04/18/11   xrt to brain mets  . Stroke [facility])     Past Surgical History: Past Surgical History:  Procedure Laterality Date  . CARPAL TUNNEL RELEASE    . CHOLECYSTECTOMY    . left knee surgery    . PARTIAL HYSTERECTOMY      Social History:  Social History   Socioeconomic History  . Marital status: Divorced    Spouse name: Not on file  . Number of children: 2  .  Years of education: Not on file  . Highest education level: Not on file  Occupational History    Employer: UNEMPLOYED    Comment: works at [employer]  . Financial resource strain: Not on file  . Food insecurity:    Worry: Not on file    Inability: Not on file  . Transportation needs:    Medical: Not on file    Non-medical: Not on file  Tobacco Use  . Smoking status: Current Every Day Smoker    Packs/day: 1.00    Years: 50.00    Pack years: 50.00    Types: Cigarettes  . Smokeless tobacco:  Never Used  Substance and Sexual Activity  . Alcohol use: No  . Drug use: No  . Sexual activity: Not Currently  Lifestyle  . Physical activity:    Days per week: Not on file    Minutes per session: Not on file  . Stress: Not on file  Relationships  . Social connections:    Talks on phone: Not on file    Gets together: Not on file    Attends religious service: Not on file    Active member of club or organization: Not on file    Attends meetings of clubs or organizations: Not on file    Relationship status: Not on file  . Intimate partner violence:    Fear of current or ex partner: Not on file    Emotionally abused: Not on file    Physically abused: Not on file    Forced sexual activity: Not on file  Other Topics Concern  . Not on file  Social History Narrative  . Not on file    Family History: Family History  Problem Relation Age of Onset  . Cancer Father        lung, leukemia  . Cancer Maternal Aunt        kidney                    ALLERGIES:  is allergic to sulfa antibiotics; carboplatin; codeine; dilaudid [hydromorphone hcl]; and penicillins.  Meds: Current Outpatient Medications  Medication Sig Dispense Refill  . budesonide-formoterol (SYMBICORT) 80-4.5 MCG/ACT inhaler Inhale 2 puffs into the lungs 2 (two) times daily.    . Cholecalciferol 2000 units CAPS Take 1 capsule by mouth daily.    Marland Kitchen doxylamine, Sleep, (UNISOM) 25 MG tablet Take 25 mg by mouth at bedtime as needed.    . gabapentin (NEURONTIN) 300 MG capsule Take 2 capsules (600 mg total) by mouth 3 (three) times daily. 180 capsule 6  . Melatonin 5 MG CAPS Take 1 capsule by mouth at bedtime as needed.    . Turmeric 500 MG CAPS Take 1 capsule by mouth daily.     No current facility-administered medications for this encounter.     Physical Findings:  weight is 177 lb 9.6 oz (80.6 kg). Her temperature is 98.3 F (36.8 C). Her blood pressure is 118/70 (pended) and her pulse is 88. Her respiration is 24  (abnormal) and oxygen saturation is 91%.   Pain scale 0/10 In general this is a well-appearing Caucasian female in no acute distress. She is alert and oriented 4 in appropriate examination. Cardiorespiratory assessment reveals normal effort without any acute distress. From a neurologic standpoint she appears to be grossly intact without focal abnormalities.  Lab Findings: Lab Results  Component Value Date   WBC 9.4 02/23/2018   WBC 11.0 (H) 08/19/2017  HGB 15.9 02/23/2018   HGB 15.8 12/23/2016   HCT 48.0 (H) 02/23/2018   HCT 47.1 (H) 12/23/2016   PLT 191 02/23/2018   PLT 181 12/23/2016    Lab Results  Component Value Date   NA 144 02/23/2018   NA 141 12/23/2016   K 4.6 02/23/2018   K 4.3 12/23/2016   CHLORIDE 104 12/23/2016   CO2 29 02/23/2018   CO2 25 12/23/2016   GLUCOSE 93 02/23/2018   GLUCOSE 97 12/23/2016   GLUCOSE 102 (H) 10/06/2012   BUN 11 02/23/2018   BUN 20.6 12/23/2016   CREATININE 0.88 02/23/2018   CREATININE 0.9 12/23/2016   BILITOT 0.3 02/23/2018   BILITOT 0.42 12/23/2016   ALKPHOS 85 02/23/2018   ALKPHOS 83 12/23/2016   AST 11 (L) 02/23/2018   AST 12 12/23/2016   ALT 9 02/23/2018   ALT 10 12/23/2016   PROT 7.2 02/23/2018   PROT 7.1 12/23/2016   ALBUMIN 3.7 02/23/2018   ALBUMIN 3.6 12/23/2016   CALCIUM 9.7 02/23/2018   CALCIUM 10.0 12/23/2016   ANIONGAP 10 02/23/2018    Radiographic Findings: Ct Chest W Contrast  Result Date: 02/24/2018 CLINICAL DATA:  Non-small cell lung cancer, chemotherapy and XRT complete EXAM: CT CHEST, ABDOMEN, AND PELVIS WITH CONTRAST TECHNIQUE: Multidetector CT imaging of the chest, abdomen and pelvis was performed following the standard protocol during bolus administration of intravenous contrast. CONTRAST:  169mL OMNIPAQUE IOHEXOL 300 MG/ML  SOLN COMPARISON:  08/19/2017 FINDINGS: CT CHEST FINDINGS Cardiovascular: Heart is normal in size. Large pericardial effusion. No evidence of thoracic aortic aneurysm. Atherosclerotic  calcifications of the aortic arch. Three vessel coronary atherosclerosis. Right chest port terminates in the lower SVC. Mediastinum/Nodes: Small mediastinal lymph nodes including a 9 mm short axis right paratracheal node, previously 7 mm. 11 mm short axis right hilar node (series 2/image 25), unchanged. Additional 13 mm short axis right perihilar node (series 2/image 26), previously 12 mm. Visualized thyroid is unremarkable. Lungs/Pleura: Radiation changes in the central right lower lobe (series 4/image 89). Radiation changes in the left paramediastinal/infrahilar region (series 4/image 71). Moderate centrilobular and paraseptal emphysematous changes, upper lobe predominant. 8 x 3 mm linear nodule in the anterior left upper lobe (series 4/image 47), unchanged. 6 x 4 mm subpleural nodule in the posterior right upper lobe (series 4/image 42), unchanged. No focal consolidation. Trace right pleural effusion.  No pneumothorax. Musculoskeletal: Stable right posterior 7th and 8th rib fracture deformities. Thoracic spine is within normal limits. CT ABDOMEN PELVIS FINDINGS Hepatobiliary: Liver is within normal limits. Status post cholecystectomy. No intrahepatic or extrahepatic ductal dilatation. Pancreas: 10 mm cyst along the superior aspect of the pancreatic body (series 2/image 55), grossly unchanged. Otherwise within normal limits. Spleen: Lobular spleen with lateral calcifications. Adrenals/Urinary Tract: Adrenal glands are within normal limits. Kidneys are within normal limits.  No hydronephrosis. Bladder is within normal limits. Stomach/Bowel: Stomach is within normal limits. No evidence of bowel obstruction. Normal appendix (series 2/image 78). Vascular/Lymphatic: 4.5 cm abdominal aortic aneurysm with aorto bi-iliac stent. Atherosclerotic calcifications the abdominal aorta and branch vessels. No suspicious abdominopelvic lymphadenopathy. Reproductive: Status post hysterectomy. Bilateral ovaries are unremarkable.  Stable 2.7 cm soft tissue lesion in the right pelvic cul-de-sac (series 2/image 98), chronic. Other: No abdominopelvic ascites. Minimal fat in the bilateral inguinal canals. Musculoskeletal: Moderate compression fracture deformities at L2 and L4, chronic, with mild degenerative changes of the lumbar spine. IMPRESSION: Radiation changes in the left paramediastinal region and right lower lobe. Mild mediastinal and right hilar  lymphadenopathy, stable versus mildly increased. Nodal recurrence is not excluded. Consider attention on follow-up versus PET-CT. Otherwise, no findings suspicious for metastatic disease. Small bilateral pulmonary nodules are unchanged, benign. Additional stable ancillary findings as above. Electronically Signed   By: Julian Hy M.D.   On: 02/24/2018 09:02   Ct Abdomen Pelvis W Contrast  Result Date: 02/24/2018 CLINICAL DATA:  Non-small cell lung cancer, chemotherapy and XRT complete EXAM: CT CHEST, ABDOMEN, AND PELVIS WITH CONTRAST TECHNIQUE: Multidetector CT imaging of the chest, abdomen and pelvis was performed following the standard protocol during bolus administration of intravenous contrast. CONTRAST:  156mL OMNIPAQUE IOHEXOL 300 MG/ML  SOLN COMPARISON:  08/19/2017 FINDINGS: CT CHEST FINDINGS Cardiovascular: Heart is normal in size. Large pericardial effusion. No evidence of thoracic aortic aneurysm. Atherosclerotic calcifications of the aortic arch. Three vessel coronary atherosclerosis. Right chest port terminates in the lower SVC. Mediastinum/Nodes: Small mediastinal lymph nodes including a 9 mm short axis right paratracheal node, previously 7 mm. 11 mm short axis right hilar node (series 2/image 25), unchanged. Additional 13 mm short axis right perihilar node (series 2/image 26), previously 12 mm. Visualized thyroid is unremarkable. Lungs/Pleura: Radiation changes in the central right lower lobe (series 4/image 89). Radiation changes in the left paramediastinal/infrahilar  region (series 4/image 71). Moderate centrilobular and paraseptal emphysematous changes, upper lobe predominant. 8 x 3 mm linear nodule in the anterior left upper lobe (series 4/image 47), unchanged. 6 x 4 mm subpleural nodule in the posterior right upper lobe (series 4/image 42), unchanged. No focal consolidation. Trace right pleural effusion.  No pneumothorax. Musculoskeletal: Stable right posterior 7th and 8th rib fracture deformities. Thoracic spine is within normal limits. CT ABDOMEN PELVIS FINDINGS Hepatobiliary: Liver is within normal limits. Status post cholecystectomy. No intrahepatic or extrahepatic ductal dilatation. Pancreas: 10 mm cyst along the superior aspect of the pancreatic body (series 2/image 55), grossly unchanged. Otherwise within normal limits. Spleen: Lobular spleen with lateral calcifications. Adrenals/Urinary Tract: Adrenal glands are within normal limits. Kidneys are within normal limits.  No hydronephrosis. Bladder is within normal limits. Stomach/Bowel: Stomach is within normal limits. No evidence of bowel obstruction. Normal appendix (series 2/image 78). Vascular/Lymphatic: 4.5 cm abdominal aortic aneurysm with aorto bi-iliac stent. Atherosclerotic calcifications the abdominal aorta and branch vessels. No suspicious abdominopelvic lymphadenopathy. Reproductive: Status post hysterectomy. Bilateral ovaries are unremarkable. Stable 2.7 cm soft tissue lesion in the right pelvic cul-de-sac (series 2/image 98), chronic. Other: No abdominopelvic ascites. Minimal fat in the bilateral inguinal canals. Musculoskeletal: Moderate compression fracture deformities at L2 and L4, chronic, with mild degenerative changes of the lumbar spine. IMPRESSION: Radiation changes in the left paramediastinal region and right lower lobe. Mild mediastinal and right hilar lymphadenopathy, stable versus mildly increased. Nodal recurrence is not excluded. Consider attention on follow-up versus PET-CT. Otherwise, no  findings suspicious for metastatic disease. Small bilateral pulmonary nodules are unchanged, benign. Additional stable ancillary findings as above. Electronically Signed   By: Julian Hy M.D.   On: 02/24/2018 09:02    Impression/Plan:   1. 67 y.o. woman s/p SRS to the brain and SBRT to the right lung for metastatic lung cancer. The patient appears to be clinically doing well. She denies any new symptoms that are of concern, and recent CT scans show no concerning findings for disease progression. She will continue on observation with Dr. Julien Nordmann with repeat restaging scans. As she has previously requested, we will not order brain MRI, however if she changes her mind or if she becomes clinically symptomatic,  we would recommend additional workup. She states understanding of this. Follow up in radiation oncology on 08/26/18 in conjunction with Dr. Julien Nordmann. She knows to call with any questions or concerns in the future.    ------------------------------------------------   Tyler Pita, MD [place] Director and Director of Stereotactic Radiosurgery Direct Dial: [phone]  Fax: [phone] [email or web address]  Skype  [email or web address]  This document serves as a record of services personally performed by Tyler Pita, MD. It was created on his behalf by Rae Lips, a trained medical scribe. The creation of this record is based on the scribe's personal observations and the provider's statements to them. This document has been checked and approved by the attending provider.

## 2018-02-25 NOTE — Patient Instructions (Signed)
Steps to Quit Smoking Smoking tobacco can be bad for your health. It can also affect almost every organ in your body. Smoking puts you and people around you at risk for many serious long-lasting (chronic) diseases. Quitting smoking is hard, but it is one of the best things that you can do for your health. It is never too late to quit. What are the benefits of quitting smoking? When you quit smoking, you lower your risk for getting serious diseases and conditions. They can include:  Lung cancer or lung disease.  Heart disease.  Stroke.  Heart attack.  Not being able to have children (infertility).  Weak bones (osteoporosis) and broken bones (fractures).  If you have coughing, wheezing, and shortness of breath, those symptoms may get better when you quit. You may also get sick less often. If you are pregnant, quitting smoking can help to lower your chances of having a baby of low birth weight. What can I do to help me quit smoking? Talk with your doctor about what can help you quit smoking. Some things you can do (strategies) include:  Quitting smoking totally, instead of slowly cutting back how much you smoke over a period of time.  Going to in-person counseling. You are more likely to quit if you go to many counseling sessions.  Using resources and support systems, such as: ? Online chats with a counselor. ? Phone quitlines. ? Printed self-help materials. ? Support groups or group counseling. ? Text messaging programs. ? Mobile phone apps or applications.  Taking medicines. Some of these medicines may have nicotine in them. If you are pregnant or breastfeeding, do not take any medicines to quit smoking unless your doctor says it is okay. Talk with your doctor about counseling or other things that can help you.  Talk with your doctor about using more than one strategy at the same time, such as taking medicines while you are also going to in-person counseling. This can help make  quitting easier. What things can I do to make it easier to quit? Quitting smoking might feel very hard at first, but there is a lot that you can do to make it easier. Take these steps:  Talk to your family and friends. Ask them to support and encourage you.  Call phone quitlines, reach out to support groups, or work with a counselor.  Ask people who smoke to not smoke around you.  Avoid places that make you want (trigger) to smoke, such as: ? Bars. ? Parties. ? Smoke-break areas at work.  Spend time with people who do not smoke.  Lower the stress in your life. Stress can make you want to smoke. Try these things to help your stress: ? Getting regular exercise. ? Deep-breathing exercises. ? Yoga. ? Meditating. ? Doing a body scan. To do this, close your eyes, focus on one area of your body at a time from head to toe, and notice which parts of your body are tense. Try to relax the muscles in those areas.  Download or buy apps on your mobile phone or tablet that can help you stick to your quit plan. There are many free apps, such as QuitGuide from the CDC (Centers for Disease Control and Prevention). You can find more support from smokefree.gov and other websites.  This information is not intended to replace advice given to you by your health care provider. Make sure you discuss any questions you have with your health care provider. Document Released: 04/05/2009 Document   Revised: 02/05/2016 Document Reviewed: 10/24/2014 Elsevier Interactive Patient Education  2018 Elsevier Inc.  

## 2018-02-25 NOTE — Progress Notes (Signed)
Weight and vitals stable. Denies pain. Reports a productive cough but unsure of the color of sputum. Reports shortness of breath worse with exertion. Denies pain or difficulty associated with swallowing. Reports headaches related to stress. Denies tinnitus. Denies diplopia. A&O x 3. Denies nausea or vomiting. Steady gait noted. Denies recent fall.     BP 140/74   Pulse 88   Temp 98.3 F (36.8 C)   Resp (!) 24   Wt 177 lb 9.6 oz (80.6 kg)   SpO2 91%   BMI 32.48 kg/m  Wt Readings from Last 3 Encounters:  02/25/18 177 lb 9.6 oz (80.6 kg)  02/25/18 179 lb 9.6 oz (81.5 kg)  08/26/17 170 lb 4.8 oz (77.2 kg)

## 2018-03-17 ENCOUNTER — Telehealth: Payer: Self-pay | Admitting: *Deleted

## 2018-03-17 NOTE — Telephone Encounter (Signed)
error 

## 2018-03-24 ENCOUNTER — Telehealth: Payer: Self-pay | Admitting: Medical Oncology

## 2018-03-24 NOTE — Telephone Encounter (Signed)
Wants a smaller tank for oxygen.

## 2018-04-04 ENCOUNTER — Other Ambulatory Visit: Payer: Self-pay | Admitting: Internal Medicine

## 2018-04-14 NOTE — Addendum Note (Signed)
Encounter addended by: Heywood Footman, RN on: 04/14/2018 12:13 PM  Actions taken: Charge Capture section accepted

## 2018-04-26 ENCOUNTER — Other Ambulatory Visit: Payer: Self-pay | Admitting: Internal Medicine

## 2018-04-26 DIAGNOSIS — G609 Hereditary and idiopathic neuropathy, unspecified: Secondary | ICD-10-CM

## 2018-04-29 ENCOUNTER — Telehealth: Payer: Self-pay | Admitting: *Deleted

## 2018-04-29 NOTE — Telephone Encounter (Signed)
Received TC from pt's nephew Kyung Rudd foster. He states his aunt (the patient) is asking for a portable 02 tank. Reviewed notes and it is noted that a message was sent to Edwinna Areola, RN with [facility] yesterday regarding a portable tank.  Provided telephone # to [facility] to nephew.

## 2018-06-05 ENCOUNTER — Emergency Department (HOSPITAL_COMMUNITY): Payer: Medicare Other

## 2018-06-05 ENCOUNTER — Encounter (HOSPITAL_COMMUNITY): Payer: Self-pay | Admitting: *Deleted

## 2018-06-05 ENCOUNTER — Emergency Department (HOSPITAL_COMMUNITY)
Admission: EM | Admit: 2018-06-05 | Discharge: 2018-06-06 | Disposition: A | Discharge status: Other Acute Inpatient Hospital | Payer: Medicare Other | Attending: Emergency Medicine | Admitting: Emergency Medicine

## 2018-06-05 ENCOUNTER — Other Ambulatory Visit: Payer: Self-pay

## 2018-06-05 DIAGNOSIS — F17210 Nicotine dependence, cigarettes, uncomplicated: Secondary | ICD-10-CM | POA: Insufficient documentation

## 2018-06-05 DIAGNOSIS — I313 Pericardial effusion (noninflammatory): Secondary | ICD-10-CM | POA: Diagnosis not present

## 2018-06-05 DIAGNOSIS — Z79899 Other long term (current) drug therapy: Secondary | ICD-10-CM | POA: Diagnosis not present

## 2018-06-05 DIAGNOSIS — C7800 Secondary malignant neoplasm of unspecified lung: Secondary | ICD-10-CM | POA: Diagnosis not present

## 2018-06-05 DIAGNOSIS — J449 Chronic obstructive pulmonary disease, unspecified: Secondary | ICD-10-CM | POA: Insufficient documentation

## 2018-06-05 DIAGNOSIS — Z85841 Personal history of malignant neoplasm of brain: Secondary | ICD-10-CM | POA: Insufficient documentation

## 2018-06-05 DIAGNOSIS — R0902 Hypoxemia: Secondary | ICD-10-CM | POA: Insufficient documentation

## 2018-06-05 DIAGNOSIS — R0600 Dyspnea, unspecified: Secondary | ICD-10-CM | POA: Diagnosis not present

## 2018-06-05 DIAGNOSIS — Z8673 Personal history of transient ischemic attack (TIA), and cerebral infarction without residual deficits: Secondary | ICD-10-CM | POA: Insufficient documentation

## 2018-06-05 DIAGNOSIS — I10 Essential (primary) hypertension: Secondary | ICD-10-CM | POA: Insufficient documentation

## 2018-06-05 DIAGNOSIS — R0602 Shortness of breath: Secondary | ICD-10-CM | POA: Diagnosis present

## 2018-06-05 DIAGNOSIS — I3139 Other pericardial effusion (noninflammatory): Secondary | ICD-10-CM

## 2018-06-05 LAB — CBC WITH DIFFERENTIAL/PLATELET
Abs Immature Granulocytes: 0.04 10*3/uL (ref 0.00–0.07)
Basophils Absolute: 0.1 10*3/uL (ref 0.0–0.1)
Basophils Relative: 1 %
Eosinophils Absolute: 0.1 10*3/uL (ref 0.0–0.5)
Eosinophils Relative: 1 %
HCT: 46 % (ref 36.0–46.0)
Hemoglobin: 14.8 g/dL (ref 12.0–15.0)
Immature Granulocytes: 0 %
Lymphocytes Relative: 22 %
Lymphs Abs: 2.2 10*3/uL (ref 0.7–4.0)
MCH: 31.2 pg (ref 26.0–34.0)
MCHC: 32.2 g/dL (ref 30.0–36.0)
MCV: 96.8 fL (ref 80.0–100.0)
Monocytes Absolute: 0.5 10*3/uL (ref 0.1–1.0)
Monocytes Relative: 6 %
Neutro Abs: 7 10*3/uL (ref 1.7–7.7)
Neutrophils Relative %: 70 %
Platelets: 182 10*3/uL (ref 150–400)
RBC: 4.75 MIL/uL (ref 3.87–5.11)
RDW: 12.6 % (ref 11.5–15.5)
WBC: 9.9 10*3/uL (ref 4.0–10.5)
nRBC: 0 % (ref 0.0–0.2)

## 2018-06-05 LAB — BASIC METABOLIC PANEL
Anion gap: 9 (ref 5–15)
BUN: 8 mg/dL (ref 8–23)
CO2: 29 mmol/L (ref 22–32)
Calcium: 9.1 mg/dL (ref 8.9–10.3)
Chloride: 101 mmol/L (ref 98–111)
Creatinine, Ser: 0.79 mg/dL (ref 0.44–1.00)
GFR calc Af Amer: >60 mL/min (ref >60)
GFR calc non Af Amer: >60 mL/min (ref >60)
Glucose, Bld: 98 mg/dL (ref 70–99)
Potassium: 4.1 mmol/L (ref 3.5–5.1)
Sodium: 139 mmol/L (ref 135–145)

## 2018-06-05 LAB — BRAIN NATRIURETIC PEPTIDE: B Natriuretic Peptide: 408 pg/mL — ABNORMAL HIGH (ref 0.0–100.0)

## 2018-06-05 LAB — TROPONIN I: Troponin I: 0.03 ng/mL (ref <0.03)

## 2018-06-05 MED ORDER — METHYLPREDNISOLONE SODIUM SUCC 125 MG IJ SOLR
80.0000 mg | Freq: Once | INTRAMUSCULAR | Status: AC
  Administered 2018-06-05: 80 mg via INTRAVENOUS
  Filled 2018-06-05: qty 2

## 2018-06-05 MED ORDER — IOPAMIDOL (ISOVUE-370) INJECTION 76%
100.0000 mL | Freq: Once | INTRAVENOUS | Status: AC | PRN
  Administered 2018-06-06: 100 mL via INTRAVENOUS

## 2018-06-05 MED ORDER — IPRATROPIUM-ALBUTEROL 0.5-2.5 (3) MG/3ML IN SOLN
3.0000 mL | Freq: Once | RESPIRATORY_TRACT | Status: AC
  Administered 2018-06-05: 3 mL via RESPIRATORY_TRACT
  Filled 2018-06-05: qty 3

## 2018-06-05 NOTE — ED Triage Notes (Signed)
Pt c/o sob that started this evening while at her son's house. Pt normally wears oxygen and there is some question if pt was wearing oxygen tonight, pt pulse ox 79% on RA, has hx of stage 4 lung and brain ca

## 2018-06-05 NOTE — ED Notes (Signed)
Pt states she has had a cold for a little over a week and her breathing has progressively gotten worse with today being the worst. Pt reports that she has had 2 episodes of having a really hard time catching her breath today. She reports she has been wearing her O2 as ordered. EMS reports that she did not have on O2 when they initially arrived. Pt states she used her Proair inhaler with no change prior to calling EMS.
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
## 2018-06-05 NOTE — ED Notes (Signed)
CRITICAL VALUE ALERT  Critical Value:  Troponin 0.03 Date & Time Notied: 06/05/18 @ 2356 Provider Notified: GMP Orders Received/Actions taken: None yet

## 2018-06-06 DIAGNOSIS — R0600 Dyspnea, unspecified: Secondary | ICD-10-CM | POA: Diagnosis not present

## 2018-06-06 NOTE — ED Provider Notes (Signed)
12:59 AM Assumed care from Dr. Wilson Singer, please see their note for full history, physical and decision making until this point. In brief this is a 67 y.o. year old female who presented to the ED tonight with Shortness of Breath     Ct with worsening pericardial effusion and lung mass. No PE.reexam with mild tachypnea. Normal lung sounds. Will d/w medicine regarding admission. Likely [facility] for CTS or cardiology consult for pericardial drain.   Refuses admission to [facility]. Requests [facility] transfer. Discussed with Dr. Norman Clay with cardiology who accepts for transfer. Stable upon transfer.   Labs, studies and imaging reviewed by myself and considered in medical decision making if ordered. Imaging interpreted by radiology.  Labs Reviewed  TROPONIN I - Abnormal; Notable for the following components:      Result Value   Troponin I 0.03 (*)    All other components within normal limits  BRAIN NATRIURETIC PEPTIDE - Abnormal; Notable for the following components:   B Natriuretic Peptide 408.0 (*)    All other components within normal limits  CBC WITH DIFFERENTIAL/PLATELET  BASIC METABOLIC PANEL    CT Angio Chest PE W and/or Wo Contrast  Final Result      No follow-ups on file.    Merrily Pew, MD 06/06/18 [phone]

## 2018-06-08 ENCOUNTER — Ambulatory Visit: Payer: Self-pay | Admitting: Internal Medicine

## 2018-06-24 ENCOUNTER — Telehealth: Payer: Self-pay

## 2018-06-24 NOTE — Telephone Encounter (Signed)
Left the pt a message that I was returning her call to see what she needed.

## 2018-07-01 ENCOUNTER — Other Ambulatory Visit: Payer: Self-pay

## 2018-07-06 ENCOUNTER — Encounter: Payer: Self-pay | Admitting: Internal Medicine

## 2018-07-06 ENCOUNTER — Ambulatory Visit (INDEPENDENT_AMBULATORY_CARE_PROVIDER_SITE_OTHER): Payer: Medicare Other | Admitting: Internal Medicine

## 2018-07-06 ENCOUNTER — Other Ambulatory Visit: Payer: Self-pay

## 2018-07-06 VITALS — BP 108/66 | HR 73 | Temp 97.8°F | Ht 61.0 in | Wt 171.0 lb

## 2018-07-06 DIAGNOSIS — J449 Chronic obstructive pulmonary disease, unspecified: Secondary | ICD-10-CM | POA: Diagnosis not present

## 2018-07-06 DIAGNOSIS — I5031 Acute diastolic (congestive) heart failure: Secondary | ICD-10-CM | POA: Diagnosis not present

## 2018-07-06 DIAGNOSIS — Z09 Encounter for follow-up examination after completed treatment for conditions other than malignant neoplasm: Secondary | ICD-10-CM

## 2018-07-06 DIAGNOSIS — F5101 Primary insomnia: Secondary | ICD-10-CM

## 2018-07-06 DIAGNOSIS — C3490 Malignant neoplasm of unspecified part of unspecified bronchus or lung: Secondary | ICD-10-CM

## 2018-07-06 DIAGNOSIS — Z79899 Other long term (current) drug therapy: Secondary | ICD-10-CM

## 2018-07-06 DIAGNOSIS — I313 Pericardial effusion (noninflammatory): Secondary | ICD-10-CM | POA: Diagnosis not present

## 2018-07-06 DIAGNOSIS — I3139 Other pericardial effusion (noninflammatory): Secondary | ICD-10-CM

## 2018-07-06 DIAGNOSIS — C7931 Secondary malignant neoplasm of brain: Secondary | ICD-10-CM

## 2018-07-06 MED ORDER — FUROSEMIDE 20 MG PO TABS: ORAL_TABLET | ORAL | 1 refills | Status: AC

## 2018-07-06 NOTE — Patient Instructions (Signed)

## 2018-07-07 LAB — CBC
Hematocrit: 45.2 % (ref 34.0–46.6)
Hemoglobin: 15.3 g/dL (ref 11.1–15.9)
MCH: 31.5 pg (ref 26.6–33.0)
MCHC: 33.8 g/dL (ref 31.5–35.7)
MCV: 93 fL (ref 79–97)
PLATELETS: 247 10*3/uL (ref 150–450)
RBC: 4.85 x10E6/uL (ref 3.77–5.28)
RDW: 11.9 % (ref 11.7–15.4)
WBC: 11.1 10*3/uL — AB (ref 3.4–10.8)

## 2018-07-07 LAB — BMP8+EGFR
BUN/Creatinine Ratio: 13 (ref 12–28)
BUN: 11 mg/dL (ref 8–27)
CALCIUM: 9.7 mg/dL (ref 8.7–10.3)
CHLORIDE: 93 mmol/L — AB (ref 96–106)
CO2: 26 mmol/L (ref 20–29)
Creatinine, Ser: 0.85 mg/dL (ref 0.57–1.00)
GFR calc Af Amer: 82 mL/min/{1.73_m2} (ref >59)
GFR, EST NON AFRICAN AMERICAN: 71 mL/min/{1.73_m2} (ref >59)
GLUCOSE: 88 mg/dL (ref 65–99)
POTASSIUM: 4.5 mmol/L (ref 3.5–5.2)
SODIUM: 136 mmol/L (ref 134–144)

## 2018-07-07 LAB — MAGNESIUM: Magnesium: 1.9 mg/dL (ref 1.6–2.3)

## 2018-07-07 LAB — PRO B NATRIURETIC PEPTIDE: NT-PRO BNP: 297 pg/mL (ref 0–301)

## 2018-07-07 LAB — HEPATITIS C ANTIBODY

## 2018-07-07 NOTE — Progress Notes (Signed)
Your white count is elevated, likely from the prednisone.  Your kidney fxn is stable. Your mg level is nl. You are neg for hepatitis c virus. Your bnp is wnl, this is the heart test. Your potassium level is normal. I do not think you need to start this. However, I do want you to start magnesium supplements. You can take 250-400mg  nightly. You can get this OTC>

## 2018-08-01 MED ORDER — TEMAZEPAM 15 MG PO CAPS: 15.0000 mg | ORAL_CAPSULE | Freq: Every evening | ORAL | 0 refills | Status: DC | PRN

## 2018-08-01 NOTE — Progress Notes (Signed)
Subjective:     Patient ID: Courtney West , female    DOB: 1951-03-23 , 68 y.o.   MRN: [id number]   Chief Complaint  Patient presents with  . Hospitalization Follow-up    HPI  She is here today for hospital f/u.  She presented to [facility] on 12/15 with progressive shortness of breath, increased productive cough, and lower extremity edema.  At [facility] ED, patient received a CTA chest which noted a large pericardial effusion that was increased from prior and was recommended for admission. She then was transferred to [facility], per her request,  for evaluation of pericardial effusion. Upon arrival to [facility], patient was hemodynamically stable and afebrile. Initial labs were significant for glucose 207. BNP was elevated at 615. EKG showed sinus rhythm with poor R wave progression. Chest x-ray showed radiation changes as well as cardiomegaly. Given that she was noted to be clinically volume overloaded, IV diuresis was started to treat newly diagnosed heart failure. TTE showed an ejection fraction of 50-55% and moderate pulmonary hypertension with dilated IVC. Moderate sized pericardial effusion was also noted on TTE, which had been noted serially on surveillance CTs since 2014 per Care Everywhere. Given that the effusion had been present for several years and had not increased in size per TTE, it was not thought to be malignant and etiology and pericardiocentesis was not thought to be indicated. She was intermittently diuresed with IV Lasix 20 mg with excellent response.   On 12/17, she was thought to be euvolemic so was transitioned to p.o. Lasix 40 mg, in which she urinated nearly 2 L. On 12/18, she was euvolemic, ambulatory, and back to baseline oxygen, so was transitioned to PO lasix 20 mg.   Given subacute worsening of her cough, patient was thought to be in a COPD exacerbation. She received IV methylprednisolone at [facility] and was continued on p.o. prednisone as well as  azithromycin. Respiratory virus panel was positive for rhino/enterovirus. She also received scheduled duo-nebs with improvement in her symptoms. She returned to her baseline home oxygen requirement at the day of discharge. She was discharged on 12/18 in stable condition.      Past Medical History:  Diagnosis Date  . Abdominal aortic aneurysm ([place])   . Allergy    codeine,pcn,sulfa drugs  . Brain cancer ([place])    mets from lung primary  . Clotted vascular catheter ([place]) 03/30/2012  . COPD (chronic obstructive pulmonary disease) ([place])   . Dyslipidemia   . History of radiation therapy 01/19/12, 01/21/12, 01/26/12   RLL lung  . History of radiation therapy 04/18/11   single fraction to 6 brain mets  . Hypertension   . Lung cancer ([place]) 12/2010  . Lung cancer ([place]) 12/22/2010  . Radiation 10/20/2011   frontal mets/Palliation  . S/P radiation therapy 7/12 thru 9/12, 04/18/11   xrt to brain mets  . Stroke [facility])      Family History  Problem Relation Age of Onset  . Cancer Father        lung, leukemia  . Leukemia Father   . Cancer Maternal Aunt        kidney  . Kidney disease Mother      Current Outpatient Medications:  .  albuterol (PROVENTIL HFA;VENTOLIN HFA) 108 (90 Base) MCG/ACT inhaler, Inhale 2 puffs into the lungs every 6 (six) hours as needed for wheezing or shortness of breath., Disp: , Rfl:  .  gabapentin (NEURONTIN) 300 MG capsule, TAKE 2  CAPSULES BY MOUTH THREE TIMES DAILY, Disp: 180 capsule, Rfl: 3 .  Melatonin 5 MG CAPS, Take 1 capsule by mouth at bedtime as needed., Disp: , Rfl:  .  SYMBICORT 80-4.5 MCG/ACT inhaler, INHALE 2 PUFFS BY MOUTH TWICE DAILY IN THE MORNING AND IN THE EVENING, Disp: 1 Inhaler, Rfl: 2 .  furosemide (LASIX) 20 MG tablet, Take 1 tablet by mouth daily, Disp: 90 tablet, Rfl: 1   Allergies  Allergen Reactions  . Sulfa Antibiotics Nausea Only  . Sulfasalazine Nausea Only  . Carboplatin Itching  . Codeine Nausea And Vomiting  . Dilaudid [Hydromorphone  Hcl] Nausea Only  . Hydromorphone Hcl Nausea Only  . Penicillins Itching and Rash    Has patient had a PCN reaction causing immediate rash, facial/tongue/throat swelling, SOB or lightheadedness with hypotension: No Has patient had a PCN reaction causing severe rash involving mucus membranes or skin necrosis: No Has patient had a PCN reaction that required hospitalization No Has patient had a PCN reaction occurring within the last 10 years: No If all of the above answers are "NO", then may proceed with Cephalosporin use.      Review of Systems  Constitutional: Negative.   Respiratory: Positive for cough.   Cardiovascular: Negative.   Gastrointestinal: Negative.   Neurological: Negative.   Psychiatric/Behavioral: Positive for sleep disturbance.     Today's Vitals   07/06/18 1517  BP: 108/66  Pulse: 73  Temp: 97.8 F (36.6 C)  TempSrc: Oral  SpO2: 93%  Weight: 171 lb (77.6 kg)  Height: 5' 1"  (1.549 m)   Body mass index is 32.31 kg/m.   Objective:  Physical Exam Vitals signs and nursing note reviewed.  Constitutional:      Appearance: Normal appearance.  Cardiovascular:     Rate and Rhythm: Normal rate and regular rhythm.     Heart sounds: Normal heart sounds.  Pulmonary:     Effort: Pulmonary effort is normal.     Comments: Decreased basilar breath sounds Skin:    General: Skin is warm.  Neurological:     General: No focal deficit present.     Mental Status: She is alert.  Psychiatric:        Mood and Affect: Mood normal.         Assessment And Plan:     1. Acute heart failure with preserved ejection fraction (HCC)   DISCHARGE SUMMARY WAS REVIEWED IN FULL DETAIL DURING THE VISIT. MEDS RECONCILED AND COMPARED TO DISCHARGE MEDS. MEDICATION LIST WAS UPDATED AND REVIEWED WITH THE PATIENT. GREATER THAN 50% FACE TO FACE TIME WAS SPENT IN COUNSELING AND COORDINATION OF CARE. ALL QUESTIONS WERE ANSWERED TO THE SATISFACTION OF THE PATIENT.   - Hepatitis C antibody -  Brain natriuretic peptide (76720)  2. Chronic obstructive pulmonary disease, unspecified COPD type (HCC)  Chronic.   3. Pericardial effusion  Please see #1.   4. Lung cancer metastatic to brain [facility])  Chronic, as per Oncology.   5. Primary insomnia  Importance of good bedtime hygiene was discussed with the patient. I will send her rx temazepam 39m nightly as needed.   6. Drug therapy  - CBC no Diff - BMP8+EGFR - Magnesium        RMaximino Greenland MD

## 2018-08-24 ENCOUNTER — Inpatient Hospital Stay: Payer: Medicare Other | Attending: Internal Medicine

## 2018-08-24 ENCOUNTER — Ambulatory Visit (HOSPITAL_COMMUNITY)
Admission: RE | Admit: 2018-08-24 | Discharge: 2018-08-24 | Disposition: A | Discharge status: Home / Self Care | Payer: Medicare Other | Source: Ambulatory Visit | Attending: Internal Medicine | Admitting: Internal Medicine

## 2018-08-24 DIAGNOSIS — Z9221 Personal history of antineoplastic chemotherapy: Secondary | ICD-10-CM | POA: Diagnosis not present

## 2018-08-24 DIAGNOSIS — Z923 Personal history of irradiation: Secondary | ICD-10-CM | POA: Insufficient documentation

## 2018-08-24 DIAGNOSIS — C3490 Malignant neoplasm of unspecified part of unspecified bronchus or lung: Secondary | ICD-10-CM | POA: Insufficient documentation

## 2018-08-24 DIAGNOSIS — Z85118 Personal history of other malignant neoplasm of bronchus and lung: Secondary | ICD-10-CM | POA: Insufficient documentation

## 2018-08-24 DIAGNOSIS — Z85841 Personal history of malignant neoplasm of brain: Secondary | ICD-10-CM | POA: Diagnosis not present

## 2018-08-24 LAB — CBC WITH DIFFERENTIAL (CANCER CENTER ONLY)
ABS IMMATURE GRANULOCYTES: 0.02 10*3/uL (ref 0.00–0.07)
Basophils Absolute: 0.1 10*3/uL (ref 0.0–0.1)
Basophils Relative: 1 %
EOS ABS: 0.1 10*3/uL (ref 0.0–0.5)
Eosinophils Relative: 1 %
HEMATOCRIT: 45.8 % (ref 36.0–46.0)
HEMOGLOBIN: 14.7 g/dL (ref 12.0–15.0)
IMMATURE GRANULOCYTES: 0 %
LYMPHS ABS: 2.2 10*3/uL (ref 0.7–4.0)
Lymphocytes Relative: 27 %
MCH: 30.6 pg (ref 26.0–34.0)
MCHC: 32.1 g/dL (ref 30.0–36.0)
MCV: 95.2 fL (ref 80.0–100.0)
MONOS PCT: 7 %
Monocytes Absolute: 0.5 10*3/uL (ref 0.1–1.0)
Neutro Abs: 5.4 10*3/uL (ref 1.7–7.7)
Neutrophils Relative %: 64 %
Platelet Count: 197 10*3/uL (ref 150–400)
RBC: 4.81 MIL/uL (ref 3.87–5.11)
RDW: 13.2 % (ref 11.5–15.5)
WBC Count: 8.3 10*3/uL (ref 4.0–10.5)
nRBC: 0 % (ref 0.0–0.2)

## 2018-08-24 LAB — CMP (CANCER CENTER ONLY)
ALBUMIN: 3.4 g/dL — AB (ref 3.5–5.0)
ALK PHOS: 86 U/L (ref 38–126)
ALT: 10 U/L (ref 0–44)
ANION GAP: 11 (ref 5–15)
AST: 13 U/L — ABNORMAL LOW (ref 15–41)
BUN: 12 mg/dL (ref 8–23)
CALCIUM: 9.6 mg/dL (ref 8.9–10.3)
CO2: 30 mmol/L (ref 22–32)
CREATININE: 0.93 mg/dL (ref 0.44–1.00)
Chloride: 100 mmol/L (ref 98–111)
GFR, Est AFR Am: >60 mL/min (ref >60)
GFR, Estimated: >60 mL/min (ref >60)
GLUCOSE: 100 mg/dL — AB (ref 70–99)
Potassium: 4.1 mmol/L (ref 3.5–5.1)
SODIUM: 141 mmol/L (ref 135–145)
Total Bilirubin: 0.4 mg/dL (ref 0.3–1.2)
Total Protein: 7.2 g/dL (ref 6.5–8.1)

## 2018-08-24 MED ORDER — IOHEXOL 300 MG/ML  SOLN
100.0000 mL | Freq: Once | INTRAMUSCULAR | Status: AC | PRN
  Administered 2018-08-24: 100 mL via INTRAVENOUS

## 2018-08-24 MED ORDER — SODIUM CHLORIDE (PF) 0.9 % IJ SOLN
INTRAMUSCULAR | Status: AC
  Filled 2018-08-24: qty 50

## 2018-08-25 ENCOUNTER — Telehealth: Payer: Self-pay | Admitting: *Deleted

## 2018-08-25 NOTE — Telephone Encounter (Signed)
Called patient to inform that Dr. Tammi Klippel would not be here on 08-26-18, lvm to give a call and reschedule this appt.

## 2018-08-26 ENCOUNTER — Inpatient Hospital Stay (HOSPITAL_BASED_OUTPATIENT_CLINIC_OR_DEPARTMENT_OTHER): Payer: Medicare Other | Admitting: Internal Medicine

## 2018-08-26 ENCOUNTER — Ambulatory Visit: Payer: Medicare Other | Attending: Radiation Oncology | Admitting: Radiation Oncology

## 2018-08-26 ENCOUNTER — Encounter: Payer: Self-pay | Admitting: Internal Medicine

## 2018-08-26 ENCOUNTER — Telehealth: Payer: Self-pay | Admitting: Internal Medicine

## 2018-08-26 VITALS — BP 113/83 | HR 75 | Temp 98.2°F | Resp 18 | Ht 61.0 in | Wt 175.4 lb

## 2018-08-26 DIAGNOSIS — C3490 Malignant neoplasm of unspecified part of unspecified bronchus or lung: Secondary | ICD-10-CM

## 2018-08-26 DIAGNOSIS — Z85841 Personal history of malignant neoplasm of brain: Secondary | ICD-10-CM | POA: Diagnosis not present

## 2018-08-26 DIAGNOSIS — Z720 Tobacco use: Secondary | ICD-10-CM

## 2018-08-26 DIAGNOSIS — Z85118 Personal history of other malignant neoplasm of bronchus and lung: Secondary | ICD-10-CM | POA: Diagnosis not present

## 2018-08-26 DIAGNOSIS — C7949 Secondary malignant neoplasm of other parts of nervous system: Secondary | ICD-10-CM

## 2018-08-26 DIAGNOSIS — C7931 Secondary malignant neoplasm of brain: Secondary | ICD-10-CM

## 2018-08-26 DIAGNOSIS — C3491 Malignant neoplasm of unspecified part of right bronchus or lung: Secondary | ICD-10-CM

## 2018-08-26 DIAGNOSIS — Z9221 Personal history of antineoplastic chemotherapy: Secondary | ICD-10-CM

## 2018-08-26 DIAGNOSIS — Z923 Personal history of irradiation: Secondary | ICD-10-CM

## 2018-08-26 NOTE — Progress Notes (Signed)
[name] Telephone:([phone]) [phone]   [phone]) [phone]  OFFICE PROGRESS NOTE  [address], [address] [place] [address]  DIAGNOSIS: Metastatic non-small cell lung cancer, favoring adenocarcinoma diagnosed in July of 2012, presented with locally advanced disease in the chest as well as brain metastasis. Veristrat test poor.   PRIOR THERAPY:  1. Status post stereotactic radiotherapy to 2 brain lesions under the care of Dr. Lisbeth Renshaw. 2. Status post a course of concurrent chemoradiation with weekly carboplatin and paclitaxel, last dose of chemotherapy was given 03/05/2011. 3. Systemic chemotherapy with carboplatin for AUC of 5 and Alimta 500 mg/M2. The patient is status post 3 cycles. Last dose was given 06/03/2011  CURRENT THERAPY: Observation.  INTERVAL HISTORY: Courtney West 68 y.o. female returns to the clinic today for 6 months follow-up visit.  The patient is feeling fine today with no concerning complaints.  She was recently diagnosed with congestive heart failure and she is currently on treatment with Lasix at [facility].  She denied having any chest pain, shortness of breath except with exertion with no cough or hemoptysis.  She has no nausea, vomiting, diarrhea or constipation.  She denied having any headache or visual changes.  The patient had repeat CT scan of the chest, abdomen and pelvis performed recently and she is here for evaluation and discussion of her scan results.  MEDICAL HISTORY: Past Medical History:  Diagnosis Date  . Abdominal aortic aneurysm ([place])   . Allergy    codeine,pcn,sulfa drugs  . Brain cancer ([place])    mets from lung primary  . Clotted vascular catheter ([place]) 03/30/2012  . COPD (chronic obstructive pulmonary disease) ([place])   . Dyslipidemia   . History of radiation therapy 01/19/12, 01/21/12, 01/26/12   RLL lung  . History of radiation therapy 04/18/11   single fraction to 6 brain mets  .  Hypertension   . Lung cancer ([place]) 12/2010  . Lung cancer ([place]) 12/22/2010  . Radiation 10/20/2011   frontal mets/Palliation  . S/P radiation therapy 7/12 thru 9/12, 04/18/11   xrt to brain mets  . Stroke [facility])     ALLERGIES:  is allergic to sulfa antibiotics; sulfasalazine; carboplatin; codeine; dilaudid [hydromorphone hcl]; hydromorphone hcl; and penicillins.  MEDICATIONS:  Current Outpatient Medications  Medication Sig Dispense Refill  . albuterol (PROVENTIL HFA;VENTOLIN HFA) 108 (90 Base) MCG/ACT inhaler Inhale 2 puffs into the lungs every 6 (six) hours as needed for wheezing or shortness of breath.    . furosemide (LASIX) 20 MG tablet Take 1 tablet by mouth daily 90 tablet 1  . gabapentin (NEURONTIN) 300 MG capsule TAKE 2 CAPSULES BY MOUTH THREE TIMES DAILY 180 capsule 3  . Melatonin 5 MG CAPS Take 1 capsule by mouth at bedtime as needed.    . SYMBICORT 80-4.5 MCG/ACT inhaler INHALE 2 PUFFS BY MOUTH TWICE DAILY IN THE MORNING AND IN THE EVENING 1 Inhaler 2  . temazepam (RESTORIL) 15 MG capsule Take 1 capsule (15 mg total) by mouth at bedtime as needed for sleep. 30 capsule 0   No current facility-administered medications for this visit.     SURGICAL HISTORY:  Past Surgical History:  Procedure Laterality Date  . CARPAL TUNNEL RELEASE    . CHOLECYSTECTOMY    . left knee surgery    . PARTIAL HYSTERECTOMY      REVIEW OF SYSTEMS:  A comprehensive review of systems was negative except for: Respiratory: positive for dyspnea  on exertion   PHYSICAL EXAMINATION: General appearance: alert, cooperative and no distress Head: Normocephalic, without obvious abnormality, atraumatic Neck: no adenopathy, no JVD, supple, symmetrical, trachea midline and thyroid not enlarged, symmetric, no tenderness/mass/nodules Lymph nodes: Cervical, supraclavicular, and axillary nodes normal. Resp: clear to auscultation bilaterally Back: symmetric, no curvature. ROM normal. No CVA tenderness. Cardio:  regular rate and rhythm, S1, S2 normal, no murmur, click, rub or gallop GI: soft, non-tender; bowel sounds normal; no masses,  no organomegaly Extremities: extremities normal, atraumatic, no cyanosis or edema  ECOG PERFORMANCE STATUS: 1 - Symptomatic but completely ambulatory  Blood pressure 113/83, pulse 75, temperature 98.2 F (36.8 C), temperature source Oral, resp. rate 18, height 5\' 1"  (1.549 m), weight 175 lb 6.4 oz (79.6 kg), SpO2 93 %.  LABORATORY DATA: Lab Results  Component Value Date   WBC 8.3 08/24/2018   HGB 14.7 08/24/2018   HCT 45.8 08/24/2018   MCV 95.2 08/24/2018   PLT 197 08/24/2018      Chemistry      Component Value Date/Time   NA 141 08/24/2018 1417   NA 136 07/06/2018 1606   NA 141 12/23/2016 0856   K 4.1 08/24/2018 1417   K 4.3 12/23/2016 0856   CL 100 08/24/2018 1417   CL 106 10/06/2012 1303   CO2 30 08/24/2018 1417   CO2 25 12/23/2016 0856   BUN 12 08/24/2018 1417   BUN 11 07/06/2018 1606   BUN 20.6 12/23/2016 0856   CREATININE 0.93 08/24/2018 1417   CREATININE 0.9 12/23/2016 0856      Component Value Date/Time   CALCIUM 9.6 08/24/2018 1417   CALCIUM 10.0 12/23/2016 0856   ALKPHOS 86 08/24/2018 1417   ALKPHOS 83 12/23/2016 0856   AST 13 (L) 08/24/2018 1417   AST 12 12/23/2016 0856   ALT 10 08/24/2018 1417   ALT 10 12/23/2016 0856   BILITOT 0.4 08/24/2018 1417   BILITOT 0.42 12/23/2016 0856       RADIOGRAPHIC STUDIES: Ct Chest W Contrast  Result Date: 08/24/2018 CLINICAL DATA:  Non-small cell lung cancer diagnosed 2012. Radiation therapy chemotherapy complete. EXAM: CT CHEST, ABDOMEN, AND PELVIS WITH CONTRAST TECHNIQUE: Multidetector CT imaging of the chest, abdomen and pelvis was performed following the standard protocol during bolus administration of intravenous contrast. CONTRAST:  140mL OMNIPAQUE IOHEXOL 300 MG/ML  SOLN COMPARISON:  CT 02/23/2018 FINDINGS: CT CHEST FINDINGS CT CHEST FINDINGS Cardiovascular: Moderate pericardial  effusion measures 2 cm in depth not changed from prior. Coronary artery calcification and aortic atherosclerotic calcification. Mediastinum/Nodes: No axillary supraclavicular adenopathy. RIGHT hilar adenopathy and RIGHT para tracheal adenopathy similar comparison exam. For example RIGHT hilar lymph node measures 13 mm (image 25/2) compares to 13 on prior. RIGHT lower paratracheal node measures 12 mm unchanged from 12 mm. Lungs/Pleura: Triangular band of consolidation in the RIGHT lower lobe is not changed from comparison exam. Angular lesion measures 4.0 by 3.9 cm compared with 4.0 by 3.7 cm. Extensive centrilobular emphysema the upper lobes. Perihilar consolidation fibrotic change and bronchiectasis in the LEFT consistent radiation injury. No new pulmonary nodules. Musculoskeletal: There is expansile lesion pathologic fractures of the posterior RIGHT seventh eighth and ninth ribs not changed from prior. CT ABDOMEN AND PELVIS FINDINGS Hepatobiliary: No focal hepatic lesion. Postcholecystectomy. No biliary dilatation. Pancreas: Pancreas is normal. No ductal dilatation. No pancreatic inflammation. Spleen: A peripheral calcification of the spleen not changed. Adrenals/urinary tract: Adrenal glands and kidneys are normal. The ureters and bladder normal. Stomach/Bowel: Stomach, small bowel, appendix, and  cecum are normal. The colon and rectosigmoid colon are normal. Vascular/Lymphatic: Aortic stent graft opacified. No retroperitoneal periportal adenopathy. Reproductive: Post hysterectomy Other: No peritoneal metastasis. Musculoskeletal: No metastatic lesions in the pelvis spine. Rib lesion described in chest section. Chronic compression fracture of the L 4 and L2 vertebral bodies. IMPRESSION: Chest Impression: 1. No evidence of lung cancer progression in thorax. 2. Stable triangular mass in the RIGHT lower lobe. 3. Stable Peri hilar postradiation change on the LEFT. 4. Stable pathologic fractures of the posterior RIGHT  ribs. 5. Stable pericardial effusion, marked volume. Abdomen / Pelvis Impression: 1. No evidence of metastatic disease in the abdomen pelvis. 2. No evidence of skeletal metastasis in the pelvis or spine. Chronic compression fractures at L4 and L2. Electronically Signed   By: Suzy Bouchard M.D.   On: 08/24/2018 16:55   Ct Abdomen Pelvis W Contrast  Result Date: 08/24/2018 CLINICAL DATA:  Non-small cell lung cancer diagnosed 2012. Radiation therapy chemotherapy complete. EXAM: CT CHEST, ABDOMEN, AND PELVIS WITH CONTRAST TECHNIQUE: Multidetector CT imaging of the chest, abdomen and pelvis was performed following the standard protocol during bolus administration of intravenous contrast. CONTRAST:  160mL OMNIPAQUE IOHEXOL 300 MG/ML  SOLN COMPARISON:  CT 02/23/2018 FINDINGS: CT CHEST FINDINGS CT CHEST FINDINGS Cardiovascular: Moderate pericardial effusion measures 2 cm in depth not changed from prior. Coronary artery calcification and aortic atherosclerotic calcification. Mediastinum/Nodes: No axillary supraclavicular adenopathy. RIGHT hilar adenopathy and RIGHT para tracheal adenopathy similar comparison exam. For example RIGHT hilar lymph node measures 13 mm (image 25/2) compares to 13 on prior. RIGHT lower paratracheal node measures 12 mm unchanged from 12 mm. Lungs/Pleura: Triangular band of consolidation in the RIGHT lower lobe is not changed from comparison exam. Angular lesion measures 4.0 by 3.9 cm compared with 4.0 by 3.7 cm. Extensive centrilobular emphysema the upper lobes. Perihilar consolidation fibrotic change and bronchiectasis in the LEFT consistent radiation injury. No new pulmonary nodules. Musculoskeletal: There is expansile lesion pathologic fractures of the posterior RIGHT seventh eighth and ninth ribs not changed from prior. CT ABDOMEN AND PELVIS FINDINGS Hepatobiliary: No focal hepatic lesion. Postcholecystectomy. No biliary dilatation. Pancreas: Pancreas is normal. No ductal dilatation. No  pancreatic inflammation. Spleen: A peripheral calcification of the spleen not changed. Adrenals/urinary tract: Adrenal glands and kidneys are normal. The ureters and bladder normal. Stomach/Bowel: Stomach, small bowel, appendix, and cecum are normal. The colon and rectosigmoid colon are normal. Vascular/Lymphatic: Aortic stent graft opacified. No retroperitoneal periportal adenopathy. Reproductive: Post hysterectomy Other: No peritoneal metastasis. Musculoskeletal: No metastatic lesions in the pelvis spine. Rib lesion described in chest section. Chronic compression fracture of the L 4 and L2 vertebral bodies. IMPRESSION: Chest Impression: 1. No evidence of lung cancer progression in thorax. 2. Stable triangular mass in the RIGHT lower lobe. 3. Stable Peri hilar postradiation change on the LEFT. 4. Stable pathologic fractures of the posterior RIGHT ribs. 5. Stable pericardial effusion, marked volume. Abdomen / Pelvis Impression: 1. No evidence of metastatic disease in the abdomen pelvis. 2. No evidence of skeletal metastasis in the pelvis or spine. Chronic compression fractures at L4 and L2. Electronically Signed   By: Suzy Bouchard M.D.   On: 08/24/2018 16:55   ASSESSMENT AND PLAN:  This is a very pleasant 68 years old white female with metastatic non-small cell lung cancer status post stereotactic radiotherapy to the brain as well as a course of concurrent chemoradiation followed by systemic chemotherapy with carboplatin and Alimta. The patient has been on observation for almost 8  years now after completion of her chemotherapy. She had repeat CT scan of the chest, abdomen and pelvis that showed no concerning findings for disease progression. I discussed the scan results with the patient today.  I recommended for her to continue on observation with repeat CT scan of the chest, abdomen and pelvis in 1 year.  The patient was advised to call if she has any concerning symptoms in the interval. The patient  voices understanding of current disease status and treatment options and is in agreement with the current care plan. All questions were answered. The patient knows to call the clinic with any problems, questions or concerns. We can certainly see the patient much sooner if necessary. I spent 10 minutes counseling the patient face to face. The total time spent in the appointment was 15 minutes. Disclaimer: This note was dictated with voice recognition software. Similar sounding words can inadvertently be transcribed and may not be corrected upon review.

## 2018-08-26 NOTE — Telephone Encounter (Signed)
Gave patient avs report and appointments for March. Central radiology will call re scan.

## 2018-09-01 ENCOUNTER — Telehealth: Payer: Self-pay | Admitting: Radiation Oncology

## 2018-09-01 NOTE — Telephone Encounter (Signed)
Received message from Vista Lawman that patient is requesting return call. Phoned patient back on mobile phone. No answer. Left voicemail message with contact information. Awaiting return call.

## 2018-09-01 NOTE — Telephone Encounter (Signed)
-----   Message from Janan Ridge sent at 08/26/2018 12:47 PM EST ----- Garnette Czech,  Ms. Kurtzman came by and would like a call from you whenever you get a chance. Thanks.

## 2018-09-13 ENCOUNTER — Other Ambulatory Visit: Payer: Self-pay | Admitting: Internal Medicine

## 2018-09-13 DIAGNOSIS — G609 Hereditary and idiopathic neuropathy, unspecified: Secondary | ICD-10-CM

## 2018-09-24 ENCOUNTER — Other Ambulatory Visit: Payer: Self-pay | Admitting: Internal Medicine

## 2018-09-24 DIAGNOSIS — F5101 Primary insomnia: Secondary | ICD-10-CM

## 2018-09-24 NOTE — Telephone Encounter (Signed)
Temazepam refill 

## 2018-09-30 ENCOUNTER — Telehealth: Payer: Self-pay | Admitting: Internal Medicine

## 2018-09-30 NOTE — Telephone Encounter (Signed)
I called and LVM for pt to rtn my call to schedule AWV with [facility].

## 2018-10-12 ENCOUNTER — Telehealth: Payer: Self-pay | Admitting: *Deleted

## 2018-10-12 NOTE — Telephone Encounter (Addendum)
"  Courtney West [phone]).  I've not been tested for corona virus but my nose started running yesterday. With the weather changes maybe it's a cold but cough, headache to forehead and eyes, itchy eyes, shortness of breath all started today.     No fever I can tell touching my forehead.  No chills.  I do not have a thermometer.    I have COPD and CHF but cough is not the usual dry hacky cough.    Use Symbicort or albuterol only if needed; used both yesterday.    Clear or white liquid draining from nose and cough.    Never had a problem with eyes this bad.    Have not travelled, been around anyone sick or anyone positive for COVID-19 that I'm aware of.  No one can get close to me.  I ride in cart when out to [place].    No, I do not have a mask to wear.    No sore throat, loss of taste or smell.   Called PCP but message received is to expect return call within 24 to 48 hours."  Advised to obtain thermometer, mask to wear whenever out.  Will notify provider.

## 2018-10-13 ENCOUNTER — Other Ambulatory Visit: Payer: Self-pay

## 2018-10-13 ENCOUNTER — Ambulatory Visit (INDEPENDENT_AMBULATORY_CARE_PROVIDER_SITE_OTHER): Payer: Medicare Other | Admitting: Internal Medicine

## 2018-10-13 ENCOUNTER — Encounter: Payer: Self-pay | Admitting: Internal Medicine

## 2018-10-13 VITALS — Ht 61.0 in

## 2018-10-13 DIAGNOSIS — R0982 Postnasal drip: Secondary | ICD-10-CM

## 2018-10-13 DIAGNOSIS — G620 Drug-induced polyneuropathy: Secondary | ICD-10-CM

## 2018-10-13 DIAGNOSIS — T451X5A Adverse effect of antineoplastic and immunosuppressive drugs, initial encounter: Secondary | ICD-10-CM

## 2018-10-13 DIAGNOSIS — F5101 Primary insomnia: Secondary | ICD-10-CM | POA: Diagnosis not present

## 2018-10-13 MED ORDER — GABAPENTIN 300 MG PO CAPS: 600.0000 mg | ORAL_CAPSULE | Freq: Three times a day (TID) | ORAL | 1 refills | Status: DC

## 2018-10-13 MED ORDER — TEMAZEPAM 15 MG PO CAPS: ORAL_CAPSULE | ORAL | 1 refills | Status: DC

## 2018-10-13 NOTE — Patient Instructions (Signed)

## 2018-10-14 NOTE — Progress Notes (Addendum)
Virtual Visit via Video   This visit type was conducted due to national recommendations for restrictions regarding the COVID-19 Pandemic (e.g. social distancing) in an effort to limit this patient's exposure and mitigate transmission in our community.  Due to her co-morbid illnesses, this patient is at least at moderate risk for complications without adequate follow up.  This format is felt to be most appropriate for this patient at this time.  All issues noted in this document were discussed and addressed.  A limited physical exam was performed with this format.    This visit type was conducted due to national recommendations for restrictions regarding the COVID-19 Pandemic (e.g. social distancing) in an effort to limit this patient's exposure and mitigate transmission in our community.  Patients identity confirmed using two different identifiers.  This format is felt to be most appropriate for this patient at this time.  All issues noted in this document were discussed and addressed.  No physical exam was performed (except for noted visual exam findings with Video Visits).    Date:  10/14/2018   ID:  Courtney West, DOB Jun 21, 1951, MRN [id number]  Patient Location:  Car, she pulled over  Provider location:   Office   Chief Complaint:  Cold symptoms  History of Present Illness:    Courtney West is a 68 y.o. female who presents via video conferencing for a telehealth visit today.    The patient does not have symptoms concerning for COVID-19 infection (fever, chills, cough, or new shortness of breath).   She presents today for virtual visit. She prefers this method of contact due to COVID-19 pandemic. She is considered high-risk due to lung cancer. She c/o cold symptoms. She denies fever/chills. Has not tried any otc meds for relief.   URI   This is a new problem. The current episode started in the past 7 days. The problem has been unchanged. There has been no fever. Associated symptoms  include congestion and rhinorrhea. She has tried nothing for the symptoms. The treatment provided no relief.     Past Medical History:  Diagnosis Date  . Abdominal aortic aneurysm ([place])   . Allergy    codeine,pcn,sulfa drugs  . Brain cancer ([place])    mets from lung primary  . Clotted vascular catheter ([place]) 03/30/2012  . COPD (chronic obstructive pulmonary disease) ([place])   . Dyslipidemia   . History of radiation therapy 01/19/12, 01/21/12, 01/26/12   RLL lung  . History of radiation therapy 04/18/11   single fraction to 6 brain mets  . Hypertension   . Lung cancer ([place]) 12/2010  . Lung cancer ([place]) 12/22/2010  . Radiation 10/20/2011   frontal mets/Palliation  . S/P radiation therapy 7/12 thru 9/12, 04/18/11   xrt to brain mets  . Stroke [facility])    Past Surgical History:  Procedure Laterality Date  . CARPAL TUNNEL RELEASE    . CHOLECYSTECTOMY    . left knee surgery    . PARTIAL HYSTERECTOMY       Current Meds  Medication Sig  . acetaminophen (TYLENOL) 500 MG tablet Take 500 mg by mouth every 6 (six) hours as needed.  Marland Kitchen albuterol (PROVENTIL HFA;VENTOLIN HFA) 108 (90 Base) MCG/ACT inhaler Inhale 2 puffs into the lungs every 6 (six) hours as needed for wheezing or shortness of breath.  . furosemide (LASIX) 20 MG tablet Take 1 tablet by mouth daily (Patient taking differently: 40 mg. Take 1 tablet by mouth daily)  . gabapentin (NEURONTIN)  300 MG capsule Take 2 capsules (600 mg total) by mouth 3 (three) times daily.  . SYMBICORT 80-4.5 MCG/ACT inhaler INHALE 2 PUFFS BY MOUTH TWICE DAILY IN THE MORNING AND IN THE EVENING  . temazepam (RESTORIL) 15 MG capsule TAKE 1 CAPSULE BY MOUTH AT BEDTIME AS NEEDED FOR SLEEP  . [DISCONTINUED] gabapentin (NEURONTIN) 300 MG capsule TAKE 2 CAPSULES BY MOUTH THREE TIMES DAILY  . [DISCONTINUED] temazepam (RESTORIL) 15 MG capsule TAKE 1 CAPSULE BY MOUTH AT BEDTIME AS NEEDED FOR SLEEP     Allergies:   Sulfa antibiotics; Sulfasalazine; Carboplatin; Codeine;  Dilaudid [hydromorphone hcl]; Hydromorphone hcl; and Penicillins   Social History   Tobacco Use  . Smoking status: Current Every Day Smoker    Packs/day: 1.00    Years: 50.00    Pack years: 50.00    Types: Cigarettes  . Smokeless tobacco: Never Used  Substance Use Topics  . Alcohol use: No  . Drug use: No     Family Hx: The patient's family history includes Cancer in her father and maternal aunt; Kidney disease in her mother; Leukemia in her father.  ROS:   Please see the history of present illness.    Review of Systems  Constitutional: Negative.   HENT: Positive for congestion and rhinorrhea.   Respiratory: Negative.   Cardiovascular: Negative.   Gastrointestinal: Negative.   Neurological: Negative.   Psychiatric/Behavioral: Negative.     All other systems reviewed and are negative.   Labs/Other Tests and Data Reviewed:    Recent Labs: 06/05/2018: B Natriuretic Peptide 408.0 07/06/2018: Magnesium 1.9; NT-Pro BNP 297 08/24/2018: ALT 10; BUN 12; Creatinine 0.93; Hemoglobin 14.7; Platelet Count 197; Potassium 4.1; Sodium 141   Recent Lipid Panel No results found for: CHOL, TRIG, HDL, CHOLHDL, LDLCALC, LDLDIRECT  Wt Readings from Last 3 Encounters:  08/26/18 175 lb 6.4 oz (79.6 kg)  07/06/18 171 lb (77.6 kg)  06/05/18 177 lb (80.3 kg)     Exam:    Vital Signs:  Ht 5\' 1"  (1.549 m)   BMI 33.14 kg/m     Physical Exam  Constitutional: She is oriented to person, place, and time and well-developed, well-nourished, and in no distress.  She is not ill-appearing  HENT:  Head: Normocephalic and atraumatic.  Neck: Normal range of motion.  Pulmonary/Chest: Effort normal.  Neurological: She is alert and oriented to person, place, and time.  Psychiatric: Affect normal.  Nursing note and vitals reviewed.   ASSESSMENT & PLAN:     1. Postnasal drip  She elects to get OTC antihistamine. She is also encouraged to avoid dairy products. She is encouraged to contact me in  48 hours to let me know how she is doing. Pt is aware she is at moderate risk for contracting COVID-19 due to her comorbid illnesses.   2. Primary insomnia  She was given refill on temazepam. Reminded to develop a bedtime routine.   - temazepam (RESTORIL) 15 MG capsule; TAKE 1 CAPSULE BY MOUTH AT BEDTIME AS NEEDED FOR SLEEP  Dispense: 30 capsule; Refill: 1  3. Chemotherapy-induced neuropathy (HCC)  Chronic. She was given refill of gabapentin.   - gabapentin (NEURONTIN) 300 MG capsule; Take 2 capsules (600 mg total) by mouth 3 (three) times daily.  Dispense: 180 capsule; Refill: 1    COVID-19 Education: The signs and symptoms of COVID-19 were discussed with the patient and how to seek care for testing (follow up with PCP or arrange E-visit).  The importance of social distancing was  discussed today.  Patient Risk:   After full review of this patients clinical status, I feel that they are at least moderate risk at this time.  Time:   Today, I have spent 7 minutes/ 36 seconds with the patient with telehealth technology discussing above diagnoses.  This is a failed virtual visit. Her video call dropped after 1 minute. She was able to get back on, but then the audio did not work. Therefore, I elected to call her on the phone for the remainder of our visit.    Medication Adjustments/Labs and Tests Ordered: Current medicines are reviewed at length with the patient today.  Concerns regarding medicines are outlined above.   Tests Ordered: No orders of the defined types were placed in this encounter.   Medication Changes: Meds ordered this encounter  Medications  . gabapentin (NEURONTIN) 300 MG capsule    Sig: Take 2 capsules (600 mg total) by mouth 3 (three) times daily.    Dispense:  180 capsule    Refill:  1  . temazepam (RESTORIL) 15 MG capsule    Sig: TAKE 1 CAPSULE BY MOUTH AT BEDTIME AS NEEDED FOR SLEEP    Dispense:  30 capsule    Refill:  1    Disposition:  Follow up prn   Signed, [name], MD

## 2018-10-15 ENCOUNTER — Telehealth: Payer: Self-pay

## 2018-10-15 NOTE — Telephone Encounter (Signed)
I left the pt a message to check and see how she is feeling.

## 2018-10-25 ENCOUNTER — Telehealth: Payer: Self-pay | Admitting: Internal Medicine

## 2018-10-25 NOTE — Telephone Encounter (Signed)
I left a message asking the patient to call me at [phone] to schedule AWV visit. VDM (DD)

## 2018-10-25 NOTE — Telephone Encounter (Signed)
APPT FOR AWV SCHEDULED

## 2018-11-04 ENCOUNTER — Ambulatory Visit (INDEPENDENT_AMBULATORY_CARE_PROVIDER_SITE_OTHER): Payer: Medicare Other | Admitting: Nurse Practitioner

## 2018-11-04 ENCOUNTER — Other Ambulatory Visit: Payer: Self-pay

## 2018-11-04 DIAGNOSIS — M25511 Pain in right shoulder: Secondary | ICD-10-CM

## 2018-11-04 MED ORDER — TRAMADOL HCL 50 MG PO TABS: 50.0000 mg | ORAL_TABLET | Freq: Four times a day (QID) | ORAL | 0 refills | Status: DC | PRN

## 2018-11-04 MED ORDER — CYCLOBENZAPRINE HCL 10 MG PO TABS: 10.0000 mg | ORAL_TABLET | Freq: Three times a day (TID) | ORAL | 0 refills | Status: DC | PRN

## 2018-11-04 NOTE — Progress Notes (Signed)
Virtual Visit via Video ([email or web address][employer][email or web address])   This visit type was conducted due to national recommendations for restrictions regarding the COVID-19 Pandemic (e.g. social distancing) in an effort to limit this patient's exposure and mitigate transmission in our community.  Patients identity confirmed using two different identifiers.  This format is felt to be most appropriate for this patient at this time.  All issues noted in this document were discussed and addressed.  No physical exam was performed (except for noted visual exam findings with Video Visits).    Date:  11/04/2018   ID:  Courtney West, Courtney West 1951-06-13, MRN [id number]  Patient Location:  Home - spoke with Marnee Spring  Provider location:   Office    Chief Complaint:  Pain to neck and shoulder right side.  History of Present Illness:    Courtney West is a 68 y.o. female who presents via video conferencing for a telehealth visit today.    The patient does not have symptoms concerning for COVID-19 infection (fever, chills, cough, or new shortness of breath).   She has pain from neck down right shoulder.  Took 6 tylenol and 4 gabapentin, took an allergy pill before 1230p.  Using heating pad.  Sharp constant pain.  She denies any falls.  She has also used hemp oil.  Denies chest pain or shortness of breath.      Past Medical History:  Diagnosis Date  . Abdominal aortic aneurysm ([place])   . Allergy    codeine,pcn,sulfa drugs  . Brain cancer ([place])    mets from lung primary  . Clotted vascular catheter ([place]) 03/30/2012  . COPD (chronic obstructive pulmonary disease) ([place])   . Dyslipidemia   . History of radiation therapy 01/19/12, 01/21/12, 01/26/12   RLL lung  . History of radiation therapy 04/18/11   single fraction to 6 brain mets  . Hypertension   . Lung cancer ([place]) 12/2010  . Lung cancer ([place]) 12/22/2010  . Radiation 10/20/2011   frontal mets/Palliation  . S/P radiation therapy 7/12 thru 9/12, 04/18/11   xrt to brain mets  .  Stroke [facility])    Past Surgical History:  Procedure Laterality Date  . CARPAL TUNNEL RELEASE    . CHOLECYSTECTOMY    . left knee surgery    . PARTIAL HYSTERECTOMY       Current Meds  Medication Sig  . acetaminophen (TYLENOL) 500 MG tablet Take 500 mg by mouth every 6 (six) hours as needed.  Marland Kitchen albuterol (PROVENTIL HFA;VENTOLIN HFA) 108 (90 Base) MCG/ACT inhaler Inhale 2 puffs into the lungs every 6 (six) hours as needed for wheezing or shortness of breath.  . furosemide (LASIX) 20 MG tablet Take 1 tablet by mouth daily (Patient taking differently: 40 mg. Take 1 tablet by mouth daily)  . gabapentin (NEURONTIN) 300 MG capsule Take 2 capsules (600 mg total) by mouth 3 (three) times daily.  . SYMBICORT 80-4.5 MCG/ACT inhaler INHALE 2 PUFFS BY MOUTH TWICE DAILY IN THE MORNING AND IN THE EVENING  . temazepam (RESTORIL) 15 MG capsule TAKE 1 CAPSULE BY MOUTH AT BEDTIME AS NEEDED FOR SLEEP     Allergies:   Sulfa antibiotics; Sulfasalazine; Carboplatin; Codeine; Dilaudid [hydromorphone hcl]; Hydromorphone hcl; and Penicillins   Social History   Tobacco Use  . Smoking status: Current Every Day Smoker    Packs/day: 1.00    Years: 50.00    Pack years: 50.00    Types: Cigarettes  . Smokeless tobacco: Never Used  Substance Use  Topics  . Alcohol use: No  . Drug use: No     Family Hx: The patient's family history includes Cancer in her father and maternal aunt; Kidney disease in her mother; Leukemia in her father.  ROS:   Please see the history of present illness.    Review of Systems  Constitutional: Negative for chills, fever and weight loss.  Respiratory: Negative for cough.   Cardiovascular: Negative.  Negative for chest pain and palpitations.  Musculoskeletal: Positive for neck pain. Negative for myalgias.       Right shoulder pain  Neurological: Negative for dizziness, tingling and headaches.    All other systems reviewed and are negative.   Labs/Other Tests and Data Reviewed:     Recent Labs: 06/05/2018: B Natriuretic Peptide 408.0 07/06/2018: Magnesium 1.9; NT-Pro BNP 297 08/24/2018: ALT 10; BUN 12; Creatinine 0.93; Hemoglobin 14.7; Platelet Count 197; Potassium 4.1; Sodium 141   Recent Lipid Panel No results found for: CHOL, TRIG, HDL, CHOLHDL, LDLCALC, LDLDIRECT  Wt Readings from Last 3 Encounters:  08/26/18 175 lb 6.4 oz (79.6 kg)  07/06/18 171 lb (77.6 kg)  06/05/18 177 lb (80.3 kg)     Exam:    Vital Signs:  There were no vitals taken for this visit.    Physical Exam  Constitutional: She is oriented to person, place, and time. No distress.  Neurological: She is alert and oriented to person, place, and time.  Psychiatric: Mood, memory, affect and judgment normal.    ASSESSMENT & PLAN:   1. Acute pain of right shoulder  Likely a pulled muscle will treat with muscle relaxer and limited supply of tramadol  ROM is good.    If not better return call to the office. - cyclobenzaprine (FLEXERIL) 10 MG tablet; Take 1 tablet (10 mg total) by mouth 3 (three) times daily as needed for muscle spasms.  Dispense: 30 tablet; Refill: 0 - traMADol (ULTRAM) 50 MG tablet; Take 1 tablet (50 mg total) by mouth every 6 (six) hours as needed.  Dispense: 20 tablet; Refill: 0   COVID-19 Education: The signs and symptoms of COVID-19 were discussed with the patient and how to seek care for testing (follow up with PCP or arrange E-visit).  The importance of social distancing was discussed today.  Patient Risk:   After full review of this patients clinical status, I feel that they are at least moderate risk at this time.  Time:   Today, I have spent 11 minutes/ seconds with the patient with telehealth technology discussing above diagnoses.     Medication Adjustments/Labs and Tests Ordered: Current medicines are reviewed at length with the patient today.  Concerns regarding medicines are outlined above.   Tests Ordered: No orders of the defined types were placed in  this encounter.   Medication Changes: No orders of the defined types were placed in this encounter.   Disposition:  Follow up prn  Signed, Minette Brine, FNP

## 2018-11-06 ENCOUNTER — Emergency Department (HOSPITAL_COMMUNITY)
Admission: EM | Admit: 2018-11-06 | Discharge: 2018-11-07 | Disposition: A | Discharge status: Home / Self Care | Payer: Medicare Other | Attending: Emergency Medicine | Admitting: Emergency Medicine

## 2018-11-06 ENCOUNTER — Emergency Department (HOSPITAL_COMMUNITY): Payer: Medicare Other

## 2018-11-06 ENCOUNTER — Encounter (HOSPITAL_COMMUNITY): Payer: Self-pay | Admitting: *Deleted

## 2018-11-06 ENCOUNTER — Other Ambulatory Visit: Payer: Self-pay

## 2018-11-06 DIAGNOSIS — F17210 Nicotine dependence, cigarettes, uncomplicated: Secondary | ICD-10-CM | POA: Diagnosis not present

## 2018-11-06 DIAGNOSIS — M25511 Pain in right shoulder: Secondary | ICD-10-CM | POA: Diagnosis present

## 2018-11-06 DIAGNOSIS — I10 Essential (primary) hypertension: Secondary | ICD-10-CM | POA: Insufficient documentation

## 2018-11-06 DIAGNOSIS — J449 Chronic obstructive pulmonary disease, unspecified: Secondary | ICD-10-CM | POA: Diagnosis not present

## 2018-11-06 MED ORDER — HYDROCODONE-ACETAMINOPHEN 5-325 MG PO TABS: 1.0000 | ORAL_TABLET | ORAL | 0 refills | Status: DC | PRN
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
## 2018-11-06 NOTE — ED Triage Notes (Addendum)
Pt with right arm pain that radiates into right shoulder and chest for 2 weeks, pt states PCP had called in muscle relaxers this past Thursday. Pt denies a fall, pt believes she hurt her arm while playing with the dog.

## 2018-11-06 NOTE — ED Notes (Signed)
Patient transported to X-ray 

## 2018-11-08 ENCOUNTER — Telehealth: Payer: Self-pay | Admitting: Orthopedic Surgery

## 2018-11-08 NOTE — Telephone Encounter (Signed)
Patient was seen in [facility] ER on 5/16 for Acute Right Shoulder Pain, no fractures. Has history of cancer. Would you like to see her in person or as Virtual? Please advise when.

## 2018-11-08 NOTE — Telephone Encounter (Signed)
Virtual

## 2018-11-10 ENCOUNTER — Other Ambulatory Visit: Payer: Self-pay

## 2018-11-10 ENCOUNTER — Ambulatory Visit (INDEPENDENT_AMBULATORY_CARE_PROVIDER_SITE_OTHER): Payer: Medicare Other | Admitting: Orthopedic Surgery

## 2018-11-10 DIAGNOSIS — M25511 Pain in right shoulder: Secondary | ICD-10-CM | POA: Diagnosis not present

## 2018-11-10 DIAGNOSIS — M7541 Impingement syndrome of right shoulder: Secondary | ICD-10-CM

## 2018-11-10 NOTE — Progress Notes (Signed)
Virtual Visit via Telephone Note  The patient is at home The provider is at the office  I connected with [place] on 11/10/18 at  3:20 PM EDT by telephone and verified that I am speaking with the correct person using two identifiers.   I discussed the limitations, risks, security and privacy concerns of performing an evaluation and management service by telephone and the availability of in person appointments. I also discussed with the patient that there may be a patient responsible charge related to this service. The patient expressed understanding and agreed to proceed.  I discussed the assessment and treatment plan with the patient. The patient was provided an opportunity to ask questions and all were answered. The patient agreed with the plan and demonstrated an understanding of the instructions.   The patient was advised to call back or seek an in-person evaluation if the symptoms worsen or if the condition fails to improve as anticipated.  I provided 15 minutes of non-face-to-face time during this encounter.  Chief complaint pain right shoulder  68 year old female with multiple medical problems including COPD history of cancer was playing with her dog 3 weeks ago the dog pulled on a rope she felt pain in her arm about 2 to 3 days later now complains of pain on the top of the shoulder radiating into the neck and elbow and occasionally into the right hand with some right thumb pai but denies numbness or tingling in the hand.  She has some pain along the shoulder blade as well on the right side.  She went to the emergency room for x-rays they were negative  She can lift her arm above her head with some pain  Her pain is not responded to muscle relaxers tramadol or Tylenol she cannot take anti-inflammatories because of her medical issues.  She has tried some hemp cream heat and ice with no relief  Review of systems as recorded again no numbness or tingling in the hand, no weakness in  the shoulder she noticed no fever no shortness of breath or chest pain other than her normal COPD symptoms  X-ray was done at the hospital 3 views right shoulder including axillary view no fracture dislocation or arthritis is seen  Radiology Dg Shoulder Right   Result Date: 11/06/2018 CLINICAL DATA:  Right shoulder pain. Injury. History of cancer. EXAM: RIGHT SHOULDER - 2+ VIEW COMPARISON:  Chest CT 08/24/2018 FINDINGS: There is no evidence of fracture or dislocation. No bony destructive change or evidence of focal lesion. Right chest port in place. Known right rib lesions not well visualized due to osseous overlap. Soft tissues are unremarkable. IMPRESSION: No acute osseous abnormality. No evidence of focal osseous lesion in the right shoulder. Electronically Signed   By: Keith Rake M.D.   On: 11/06/2018 23:50    HPI KARIMA CARRELL is a 68 y.o. female.     The history is provided by the patient. No language interpreter was used.  Arm Pain  This is a new problem. Episode onset: 2 weeks. The problem occurs constantly. The problem has been gradually worsening. Nothing aggravates the symptoms. Nothing relieves the symptoms. She has tried nothing for the symptoms. The treatment provided no relief.    Pt complains of pain in her right shoulder.  Pt reports pain began after playing tug of war with her dog.  Pt reports she dog pulled her arm.  Patient has a history of lung cancer and a history of brain mets which  she has had radiation therapy for.  Patient reports that her oncologist told her she did not need a checkup for 6 months.  The notes from the ER are noted in the chart and included as part of the review  Diagnosis right shoulder pain acute probably has resulting impingement syndrome  The patient responded to the hydrocodone but I told her that is an opioid and she can continue to take that.  She cannot take NSAIDs but is okay to take Tylenol and use topical creams  I offered her an  injection but she declined.  She would be eligible for physical therapy however her risk factors may restrict her in terms of going to the therapist she could have a home program.  I left the appointment as that she could call back to get the injection and at the time we can give her therapy shoulder exercises to do    Arther Abbott, MD

## 2018-11-17 ENCOUNTER — Encounter: Payer: Self-pay | Admitting: Nurse Practitioner

## 2018-11-21 ENCOUNTER — Other Ambulatory Visit: Payer: Self-pay | Admitting: Internal Medicine

## 2018-12-16 ENCOUNTER — Telehealth: Payer: Self-pay

## 2018-12-16 ENCOUNTER — Telehealth: Payer: Self-pay | Admitting: Medical Oncology

## 2018-12-16 NOTE — Telephone Encounter (Signed)
Oxygen-I returned pt call and had to leave a VM to ask who ordered her oxygen and who is the provider for the oxygen?

## 2018-12-16 NOTE — Telephone Encounter (Signed)
Oxygen order- Pt asking for oxygen order . I LVM to call with name of company who provides her oxygen and who ordered oxygen for her.

## 2018-12-16 NOTE — Telephone Encounter (Signed)
I returned the pt's call and left a message that Dr. Baird Cancer is sending a prescription for a portable oxygen tank to [facility] at the pt's request.

## 2018-12-17 NOTE — Telephone Encounter (Signed)
Courtney West that she had her PCP get the order for oxygen.

## 2018-12-18 ENCOUNTER — Encounter (HOSPITAL_COMMUNITY): Payer: Self-pay | Admitting: *Deleted

## 2018-12-18 ENCOUNTER — Other Ambulatory Visit: Payer: Self-pay

## 2018-12-18 ENCOUNTER — Emergency Department (HOSPITAL_COMMUNITY)
Admission: EM | Admit: 2018-12-18 | Discharge: 2018-12-18 | Disposition: A | Discharge status: Home / Self Care | Payer: Medicare Other | Attending: Emergency Medicine | Admitting: Emergency Medicine

## 2018-12-18 DIAGNOSIS — L0211 Cutaneous abscess of neck: Secondary | ICD-10-CM | POA: Diagnosis present

## 2018-12-18 DIAGNOSIS — J449 Chronic obstructive pulmonary disease, unspecified: Secondary | ICD-10-CM | POA: Diagnosis not present

## 2018-12-18 DIAGNOSIS — Z79899 Other long term (current) drug therapy: Secondary | ICD-10-CM | POA: Diagnosis not present

## 2018-12-18 DIAGNOSIS — I10 Essential (primary) hypertension: Secondary | ICD-10-CM | POA: Insufficient documentation

## 2018-12-18 DIAGNOSIS — R21 Rash and other nonspecific skin eruption: Secondary | ICD-10-CM | POA: Insufficient documentation

## 2018-12-18 DIAGNOSIS — F17210 Nicotine dependence, cigarettes, uncomplicated: Secondary | ICD-10-CM | POA: Diagnosis not present

## 2018-12-18 MED ORDER — DIPHENHYDRAMINE HCL 25 MG PO CAPS
25.0000 mg | ORAL_CAPSULE | Freq: Once | ORAL | Status: AC
  Administered 2018-12-18: 25 mg via ORAL
  Filled 2018-12-18: qty 1

## 2018-12-18 MED ORDER — PREDNISONE 50 MG PO TABS: ORAL_TABLET | ORAL | 0 refills | Status: DC

## 2018-12-18 MED ORDER — PREDNISONE 50 MG PO TABS
60.0000 mg | ORAL_TABLET | Freq: Once | ORAL | Status: AC
  Administered 2018-12-18: 60 mg via ORAL
  Filled 2018-12-18: qty 1

## 2018-12-18 NOTE — ED Triage Notes (Signed)
Pt has bump to neck that appeared this am
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
## 2018-12-18 NOTE — ED Notes (Signed)
Pt ambulatory to waiting room. Pt verbalized understanding of discharge instructions.   

## 2018-12-22 ENCOUNTER — Telehealth: Payer: Self-pay

## 2018-12-22 NOTE — Telephone Encounter (Signed)
I left the pt a message that I needed her to make an appt for evaluation for the settings on her portable oxygen because [employer] needed current notes.

## 2018-12-23 ENCOUNTER — Other Ambulatory Visit: Payer: Self-pay | Admitting: Internal Medicine

## 2018-12-23 DIAGNOSIS — T451X5A Adverse effect of antineoplastic and immunosuppressive drugs, initial encounter: Secondary | ICD-10-CM

## 2018-12-23 DIAGNOSIS — G620 Drug-induced polyneuropathy: Secondary | ICD-10-CM

## 2018-12-23 NOTE — Telephone Encounter (Signed)
Gabapentin refill

## 2018-12-28 ENCOUNTER — Ambulatory Visit (INDEPENDENT_AMBULATORY_CARE_PROVIDER_SITE_OTHER): Payer: Medicare Other | Admitting: Internal Medicine

## 2018-12-28 ENCOUNTER — Encounter: Payer: Self-pay | Admitting: Internal Medicine

## 2018-12-28 ENCOUNTER — Ambulatory Visit (INDEPENDENT_AMBULATORY_CARE_PROVIDER_SITE_OTHER): Payer: Medicare Other

## 2018-12-28 ENCOUNTER — Other Ambulatory Visit: Payer: Self-pay

## 2018-12-28 VITALS — BP 102/60 | HR 84 | Temp 98.8°F | Ht 62.2 in | Wt 172.4 lb

## 2018-12-28 DIAGNOSIS — F5101 Primary insomnia: Secondary | ICD-10-CM | POA: Diagnosis not present

## 2018-12-28 DIAGNOSIS — J449 Chronic obstructive pulmonary disease, unspecified: Secondary | ICD-10-CM

## 2018-12-28 DIAGNOSIS — I714 Abdominal aortic aneurysm, without rupture: Secondary | ICD-10-CM

## 2018-12-28 DIAGNOSIS — Z0000 Encounter for general adult medical examination without abnormal findings: Secondary | ICD-10-CM | POA: Diagnosis not present

## 2018-12-28 DIAGNOSIS — I10 Essential (primary) hypertension: Secondary | ICD-10-CM | POA: Diagnosis not present

## 2018-12-28 DIAGNOSIS — C7931 Secondary malignant neoplasm of brain: Secondary | ICD-10-CM

## 2018-12-28 DIAGNOSIS — F17209 Nicotine dependence, unspecified, with unspecified nicotine-induced disorders: Secondary | ICD-10-CM

## 2018-12-28 DIAGNOSIS — C3490 Malignant neoplasm of unspecified part of unspecified bronchus or lung: Secondary | ICD-10-CM

## 2018-12-28 DIAGNOSIS — I7143 Infrarenal abdominal aortic aneurysm, without rupture: Secondary | ICD-10-CM

## 2018-12-28 DIAGNOSIS — Z9981 Dependence on supplemental oxygen: Secondary | ICD-10-CM

## 2018-12-28 LAB — POCT URINALYSIS DIPSTICK
Bilirubin, UA: NEGATIVE
Blood, UA: NEGATIVE
Glucose, UA: NEGATIVE
Ketones, UA: NEGATIVE
Leukocytes, UA: NEGATIVE
Nitrite, UA: NEGATIVE
Protein, UA: NEGATIVE
Spec Grav, UA: 1.02 (ref 1.010–1.025)
Urobilinogen, UA: 0.2 E.U./dL
pH, UA: 5.5 (ref 5.0–8.0)

## 2018-12-28 LAB — POCT UA - MICROALBUMIN
Albumin/Creatinine Ratio, Urine, POC: <30
Creatinine, POC: 50 mg/dL
Microalbumin Ur, POC: 10 mg/L

## 2018-12-28 MED ORDER — PROAIR HFA 108 (90 BASE) MCG/ACT IN AERS: INHALATION_SPRAY | RESPIRATORY_TRACT | 11 refills | Status: AC

## 2018-12-28 MED ORDER — BUDESONIDE-FORMOTEROL FUMARATE 80-4.5 MCG/ACT IN AERO: INHALATION_SPRAY | RESPIRATORY_TRACT | 5 refills | Status: DC

## 2018-12-28 NOTE — Patient Instructions (Signed)

## 2018-12-28 NOTE — Patient Instructions (Addendum)
Courtney West , Thank you for taking time to come for your Medicare Wellness Visit. I appreciate your ongoing commitment to your health goals. Please review the following plan we discussed and let me know if I can assist you in the future.   Screening recommendations/referrals: Colonoscopy: decline Mammogram: decline Bone Density: 09/2016 Recommended yearly ophthalmology/optometry visit for glaucoma screening and checkup Recommended yearly dental visit for hygiene and checkup  Vaccinations: Influenza vaccine: decline Pneumococcal vaccine: 11/20118 Tdap vaccine: decline Shingles vaccine: discussed    Advanced directives: Please bring a copy of your POA (Power of Attorney) and/or Living Will to your next appointment.    Conditions/risks identified: smoking  Next appointment: 06/30/2019 at 11:30   Preventive Care 68 Years and Older, Female Preventive care refers to lifestyle choices and visits with your health care provider that can promote health and wellness. What does preventive care include?  A yearly physical exam. This is also called an annual well check.  Dental exams once or twice a year.  Routine eye exams. Ask your health care provider how often you should have your eyes checked.  Personal lifestyle choices, including:  Daily care of your teeth and gums.  Regular physical activity.  Eating a healthy diet.  Avoiding tobacco and drug use.  Limiting alcohol use.  Practicing safe sex.  Taking low-dose aspirin every day.  Taking vitamin and mineral supplements as recommended by your health care provider. What happens during an annual well check? The services and screenings done by your health care provider during your annual well check will depend on your age, overall health, lifestyle risk factors, and family history of disease. Counseling  Your health care provider may ask you questions about your:  Alcohol use.  Tobacco use.  Drug use.  Emotional well-being.   Home and relationship well-being.  Sexual activity.  Eating habits.  History of falls.  Memory and ability to understand (cognition).  Work and work Statistician.  Reproductive health. Screening  You may have the following tests or measurements:  Height, weight, and BMI.  Blood pressure.  Lipid and cholesterol levels. These may be checked every 5 years, or more frequently if you are over 41 years old.  Skin check.  Lung cancer screening. You may have this screening every year starting at age 24 if you have a 30-pack-year history of smoking and currently smoke or have quit within the past 15 years.  Fecal occult blood test (FOBT) of the stool. You may have this test every year starting at age 43.  Flexible sigmoidoscopy or colonoscopy. You may have a sigmoidoscopy every 5 years or a colonoscopy every 10 years starting at age 23.  Hepatitis C blood test.  Hepatitis B blood test.  Sexually transmitted disease (STD) testing.  Diabetes screening. This is done by checking your blood sugar (glucose) after you have not eaten for a while (fasting). You may have this done every 1-3 years.  Bone density scan. This is done to screen for osteoporosis. You may have this done starting at age 43.  Mammogram. This may be done every 1-2 years. Talk to your health care provider about how often you should have regular mammograms. Talk with your health care provider about your test results, treatment options, and if necessary, the need for more tests. Vaccines  Your health care provider may recommend certain vaccines, such as:  Influenza vaccine. This is recommended every year.  Tetanus, diphtheria, and acellular pertussis (Tdap, Td) vaccine. You may need a Td booster  every 10 years.  Zoster vaccine. You may need this after age 34.  Pneumococcal 13-valent conjugate (PCV13) vaccine. One dose is recommended after age 51.  Pneumococcal polysaccharide (PPSV23) vaccine. One dose is  recommended after age 50. Talk to your health care provider about which screenings and vaccines you need and how often you need them. This information is not intended to replace advice given to you by your health care provider. Make sure you discuss any questions you have with your health care provider. Document Released: 07/06/2015 Document Revised: 02/27/2016 Document Reviewed: 04/10/2015 Elsevier Interactive Patient Education  2017 [place] Prevention in the Home Falls can cause injuries. They can happen to people of all ages. There are many things you can do to make your home safe and to help prevent falls. What can I do on the outside of my home?  Regularly fix the edges of walkways and driveways and fix any cracks.  Remove anything that might make you trip as you walk through a door, such as a raised step or threshold.  Trim any bushes or trees on the path to your home.  Use bright outdoor lighting.  Clear any walking paths of anything that might make someone trip, such as rocks or tools.  Regularly check to see if handrails are loose or broken. Make sure that both sides of any steps have handrails.  Any raised decks and porches should have guardrails on the edges.  Have any leaves, snow, or ice cleared regularly.  Use sand or salt on walking paths during winter.  Clean up any spills in your garage right away. This includes oil or grease spills. What can I do in the bathroom?  Use night lights.  Install grab bars by the toilet and in the tub and shower. Do not use towel bars as grab bars.  Use non-skid mats or decals in the tub or shower.  If you need to sit down in the shower, use a plastic, non-slip stool.  Keep the floor dry. Clean up any water that spills on the floor as soon as it happens.  Remove soap buildup in the tub or shower regularly.  Attach bath mats securely with double-sided non-slip rug tape.  Do not have throw rugs and other things on  the floor that can make you trip. What can I do in the bedroom?  Use night lights.  Make sure that you have a light by your bed that is easy to reach.  Do not use any sheets or blankets that are too big for your bed. They should not hang down onto the floor.  Have a firm chair that has side arms. You can use this for support while you get dressed.  Do not have throw rugs and other things on the floor that can make you trip. What can I do in the kitchen?  Clean up any spills right away.  Avoid walking on wet floors.  Keep items that you use a lot in easy-to-reach places.  If you need to reach something above you, use a strong step stool that has a grab bar.  Keep electrical cords out of the way.  Do not use floor polish or wax that makes floors slippery. If you must use wax, use non-skid floor wax.  Do not have throw rugs and other things on the floor that can make you trip. What can I do with my stairs?  Do not leave any items on the stairs.  Make  sure that there are handrails on both sides of the stairs and use them. Fix handrails that are broken or loose. Make sure that handrails are as long as the stairways.  Check any carpeting to make sure that it is firmly attached to the stairs. Fix any carpet that is loose or worn.  Avoid having throw rugs at the top or bottom of the stairs. If you do have throw rugs, attach them to the floor with carpet tape.  Make sure that you have a light switch at the top of the stairs and the bottom of the stairs. If you do not have them, ask someone to add them for you. What else can I do to help prevent falls?  Wear shoes that:  Do not have high heels.  Have rubber bottoms.  Are comfortable and fit you well.  Are closed at the toe. Do not wear sandals.  If you use a stepladder:  Make sure that it is fully opened. Do not climb a closed stepladder.  Make sure that both sides of the stepladder are locked into place.  Ask someone to  hold it for you, if possible.  Clearly mark and make sure that you can see:  Any grab bars or handrails.  First and last steps.  Where the edge of each step is.  Use tools that help you move around (mobility aids) if they are needed. These include:  Canes.  Walkers.  Scooters.  Crutches.  Turn on the lights when you go into a dark area. Replace any light bulbs as soon as they burn out.  Set up your furniture so you have a clear path. Avoid moving your furniture around.  If any of your floors are uneven, fix them.  If there are any pets around you, be aware of where they are.  Review your medicines with your doctor. Some medicines can make you feel dizzy. This can increase your chance of falling. Ask your doctor what other things that you can do to help prevent falls. This information is not intended to replace advice given to you by your health care provider. Make sure you discuss any questions you have with your health care provider. Document Released: 04/05/2009 Document Revised: 11/15/2015 Document Reviewed: 07/14/2014 Elsevier Interactive Patient Education  2017 Elsevier Inc.   Smoking and Musculoskeletal Health Smoking is bad for your health. Most people know that smoking causes lung disease, heart disease, and cancer. But people may not realize that it also affects their bones, muscles, and joints (musculoskeletal system). When you smoke, the effects on your lungs and heart result in less oxygen for your musculoskeletal system. This can lead to poor bone and joint health. How can smoking affect my musculoskeletal health? Smoking can:  Increase your risk of having weak, thin bones (osteoporosis). Elderly smokers are at higher risk for bone fractures related to osteoporosis.  Decrease the ability of bone-forming cells to make and replace bone (in addition to reducing oxygen and blood flow).  Reduce your body's ability to absorb calcium from your diet. Less calcium  means weaker bones.  Interfere with the breakdown of the female hormone estrogen. Smoking lowers estrogen, which is a hormone that helps keep bones strong. Women who smoke may have earlier menopause. Menopause is a risk factor for osteoporosis.  Weaken the tissues that attach bones to muscles (tendons). This can lead to shoulder, back, and other joint injuries.  Increase your risk of rheumatoid arthritis or make the condition worse if you  already have it.  Slow down healing and increase your risk of infection and other complications if you have a bone fracture or surgery that involves your musculoskeletal system.  Make you get out of breath easily. This can keep you from getting the exercise you need to keep your bones and joints healthy.  Decrease your appetite and body mass. You may lose weight and muscle strength. This can put you at higher risk for muscle injury, joint injury, and broken bones. What actions can I take to prevent musculoskeletal problems? Quit smoking      Do not start smoking. Quit if you already do. Even stopping later in life can improve musculoskeletal health.  Do not use any products that contain nicotine or tobacco. Do not replace cigarette smoking with e-cigarettes. The safety of e-cigarettes is not known, and some may contain harmful chemicals.  Make a plan to quit smoking and commit to it. Look for programs to help you, and ask your health care provider for recommendations and ideas.  Talk with your health care provider about using nicotine replacement medicines to help you quit, such as gum, lozenges, patches, sprays, or pills. Make other lifestyle changes   Eat a healthy diet that includes calcium and vitamin D. These nutrients are important for bone health. ? Calcium is found in dairy foods and green leafy vegetables. ? Vitamin D is found in eggs, fish, and liver. ? Many foods also have vitamin D and calcium added to them (are fortified). ? Ask your  health care provider if you would benefit from taking a supplement.  Get out in the sunshine for a short time every day. This increases production of vitamin D.  Get 30 minutes of exercise at least 5 days a week. Weight-bearing and strength exercises are best for musculoskeletal health. Ask your health care provider what type of exercise is safe for you.  Do not drink alcohol if: ? Your health care provider tells you not to drink. ? You are pregnant, may be pregnant, or are planning to become pregnant.  If you drink alcohol, limit how much you have: ? 0-1 drink a day for women. ? 0-2 drinks a day for men.  Be aware of how much alcohol is in your drink. In the [place] one drink equals one 12 oz bottle of beer (355 mL), one 5 oz glass of wine (148 mL), or one 1 oz glass of hard liquor (44 mL). Where to find more information You may find more information about smoking, musculoskeletal health, and quitting smoking from:  [place] [employer]: orthoinfo.aaos.[place], Osteoporosis and Related [place]: bones.[place].es  HelpGuide.org: helpguide.org  https://hall.com/: smokefree.gov  American Lung Association: lung.org Contact a health care provider if:  You need help to quit smoking. Summary  When you smoke, the effects on your lungs and heart result in less oxygen for your musculoskeletal system.  Even stopping smoking later in life can improve musculoskeletal health.  Do not use any products that contain nicotine or tobacco, such as cigarettes and e-cigarettes.  If you need help quitting, ask your health care provider. This information is not intended to replace advice given to you by your health care provider. Make sure you discuss any questions you have with your health care provider. Document Released: 10/05/2017 Document Revised: 10/05/2017 Document Reviewed: 10/05/2017 Elsevier Patient Education  [place].

## 2018-12-28 NOTE — Progress Notes (Signed)
Subjective:   Courtney West is a 68 y.o. female who presents for Medicare Annual (Subsequent) preventive examination.  Review of Systems:  n/a Cardiac Risk Factors include: advanced age (>83men, >66 women);hypertension;smoking/ tobacco exposure;sedentary lifestyle;obesity (BMI >30kg/m2)     Objective:     Vitals: BP 102/60 (BP Location: Left Arm, Patient Position: Sitting, Cuff Size: Normal)   Pulse 84   Temp 98.8 F (37.1 C) (Oral)   Ht 5' 2.2" (1.58 m)   Wt 172 lb 6.4 oz (78.2 kg)   SpO2 (!) 88%   BMI 31.33 kg/m   Body mass index is 31.33 kg/m.  Advanced Directives 12/28/2018 12/18/2018 06/05/2018 02/25/2018 08/26/2017 02/17/2017 10/14/2016  Does Patient Have a Medical Advance Directive? Yes No Yes No No Yes Yes  Type of Advance Directive Living will - Living will - - [place];Living will [place]  Does patient want to make changes to medical advance directive? - - - - - - No - Patient declined  Copy of [place] in Chart? - - - - - Yes No - copy requested  Would patient like information on creating a medical advance directive? - - - - - - No - Patient declined    Tobacco Social History   Tobacco Use  Smoking Status Current Every Day Smoker  . Packs/day: 1.00  . Years: 50.00  . Pack years: 50.00  . Types: Cigarettes  Smokeless Tobacco Never Used     Ready to quit: No Counseling given: Yes   Clinical Intake:  Pre-visit preparation completed: Yes  Pain : No/denies pain     Nutritional Status: BMI > 30  Obese Nutritional Risks: None Diabetes: No  How often do you need to have someone help you when you read instructions, pamphlets, or other written materials from your doctor or pharmacy?: 1 - Never What is the last grade level you completed in school?: GED  Interpreter Needed?: No  Information entered by :: NAllen LPN  Past Medical History:  Diagnosis Date  . Abdominal aortic aneurysm ([place])   .  Allergy    codeine,pcn,sulfa drugs  . Brain cancer ([place])    mets from lung primary  . Clotted vascular catheter ([place]) 03/30/2012  . COPD (chronic obstructive pulmonary disease) ([place])   . Dyslipidemia   . History of radiation therapy 01/19/12, 01/21/12, 01/26/12   RLL lung  . History of radiation therapy 04/18/11   single fraction to 6 brain mets  . Hypertension   . Lung cancer ([place]) 12/2010  . Lung cancer ([place]) 12/22/2010  . Radiation 10/20/2011   frontal mets/Palliation  . S/P radiation therapy 7/12 thru 9/12, 04/18/11   xrt to brain mets  . Stroke [facility])    Past Surgical History:  Procedure Laterality Date  . CARPAL TUNNEL RELEASE    . CHOLECYSTECTOMY    . left knee surgery    . PARTIAL HYSTERECTOMY     Family History  Problem Relation Age of Onset  . Cancer Father        lung, leukemia  . Leukemia Father   . Cancer Maternal Aunt        kidney  . Kidney disease Mother    Social History   Socioeconomic History  . Marital status: Divorced    Spouse name: Not on file  . Number of children: 2  . Years of education: Not on file  . Highest education level: Not on file  Occupational History  Employer: UNEMPLOYED    Comment: works at [employer]  . Financial resource strain: Not hard at all  . Food insecurity    Worry: Never true    Inability: Never true  . Transportation needs    Medical: No    Non-medical: No  Tobacco Use  . Smoking status: Current Every Day Smoker    Packs/day: 1.00    Years: 50.00    Pack years: 50.00    Types: Cigarettes  . Smokeless tobacco: Never Used  Substance and Sexual Activity  . Alcohol use: No  . Drug use: No  . Sexual activity: Not Currently  Lifestyle  . Physical activity    Days per week: 0 days    Minutes per session: 0 min  . Stress: Not at all  Relationships  . Social Herbalist on phone: Not on file    Gets together: Not on file    Attends religious service: Not on file    Active member of  club or organization: Not on file    Attends meetings of clubs or organizations: Not on file    Relationship status: Not on file  Other Topics Concern  . Not on file  Social History Narrative  . Not on file    Outpatient Encounter Medications as of 12/28/2018  Medication Sig  . acetaminophen (TYLENOL) 500 MG tablet Take 500 mg by mouth every 6 (six) hours as needed.  . furosemide (LASIX) 20 MG tablet Take 1 tablet by mouth daily (Patient taking differently: 40 mg. Take 1 tablet by mouth daily)  . gabapentin (NEURONTIN) 300 MG capsule TAKE 2 CAPSULES BY MOUTH THREE TIMES DAILY  . Melatonin 5 MG CAPS Take 1 capsule by mouth at bedtime as needed.  Marland Kitchen PROAIR HFA 108 (90 Base) MCG/ACT inhaler INHALE 2 PUFFS BY MOUTH EVERY 4 TO 6 HOURS AS NEEDED  . SYMBICORT 80-4.5 MCG/ACT inhaler INHALE 2 PUFFS BY MOUTH TWICE DAILY IN THE MORNING AND IN THE EVENING  . temazepam (RESTORIL) 15 MG capsule TAKE 1 CAPSULE BY MOUTH AT BEDTIME AS NEEDED FOR SLEEP  . predniSONE (DELTASONE) 50 MG tablet One tablet po daily (Patient not taking: Reported on 12/28/2018)  . traMADol (ULTRAM) 50 MG tablet Take 1 tablet (50 mg total) by mouth every 6 (six) hours as needed. (Patient not taking: Reported on 12/28/2018)   No facility-administered encounter medications on file as of 12/28/2018.     Activities of Daily Living In your present state of health, do you have any difficulty performing the following activities: 12/28/2018  Hearing? N  Vision? N  Difficulty concentrating or making decisions? N  Walking or climbing stairs? Y  Comment due to knees  Doing errands, shopping? N  Preparing Food and eating ? N  Using the Toilet? N  In the past six months, have you accidently leaked urine? Y  Do you have problems with loss of bowel control? N  Managing your Medications? N  Managing your Finances? N  Housekeeping or managing your Housekeeping? N  Some recent data might be hidden    Patient Care Team: Glendale Chard, MD as PCP  - General (Internal Medicine)    Assessment:   This is a routine wellness examination for Courtney West.  Exercise Activities and Dietary recommendations Current Exercise Habits: The patient does not participate in regular exercise at present  Goals    . Patient Stated     No goals  Fall Risk Fall Risk  12/28/2018 11/04/2018 10/13/2018 07/06/2018 07/14/2016  Falls in the past year? 0 0 0 0 No  Risk for fall due to : Medication side effect - - - -  Follow up Falls evaluation completed;Education provided;Falls prevention discussed - - - -   Is the patient's home free of loose throw rugs in walkways, pet beds, electrical cords, etc?   yes      Grab bars in the bathroom? no      Handrails on the stairs?  n/a      Adequate lighting?   yes  Timed Get Up and Go performed: n/a  Depression Screen PHQ 2/9 Scores 12/28/2018 11/04/2018 10/13/2018 07/06/2018  PHQ - 2 Score 0 0 0 0  PHQ- 9 Score 3 - - -     Cognitive Function     6CIT Screen 12/28/2018  What Year? 0 points  What month? 0 points  What time? 0 points  Count back from 20 0 points  Months in reverse 0 points  Repeat phrase 0 points  Total Score 0    Immunization History  Administered Date(s) Administered  . Pneumococcal Polysaccharide-23 03/27/2015    Qualifies for Shingles Vaccine? yes  Screening Tests Health Maintenance  Topic Date Due  . TETANUS/TDAP  04/02/1970  . COLONOSCOPY  04/02/2001  . DEXA SCAN  04/02/2016  . PNA vac Low Risk Adult (1 of 2 - PCV13) 04/02/2016  . MAMMOGRAM  07/07/2019 (Originally 04/02/2001)  . Hepatitis C Screening  Completed  . INFLUENZA VACCINE  Discontinued    Cancer Screenings: Lung: Low Dose CT Chest recommended if Age 22-80 years, 30 pack-year currently smoking OR have quit w/in 15years. Patient does qualify. Breast:  Up to date on Mammogram? No   Up to date of Bone Density/Dexa? Yes Colorectal: declines  Additional Screenings: : Hepatitis C Screening: 07/06/2018     Plan:     6 CIT was 0. No goals set. Declines colonoscopy, mammogram, Tetanus and flu vaccine.   I have personally reviewed and noted the following in the patient's chart:   . Medical and social history . Use of alcohol, tobacco or illicit drugs  . Current medications and supplements . Functional ability and status . Nutritional status . Physical activity . Advanced directives . List of other physicians . Hospitalizations, surgeries, and ER visits in previous 12 months . Vitals . Screenings to include cognitive, depression, and falls . Referrals and appointments  In addition, I have reviewed and discussed with patient certain preventive protocols, quality metrics, and best practice recommendations. A written personalized care plan for preventive services as well as general preventive health recommendations were provided to patient.     Kellie Simmering, LPN  08/01/6379

## 2019-01-03 ENCOUNTER — Other Ambulatory Visit: Payer: Self-pay

## 2019-01-03 ENCOUNTER — Other Ambulatory Visit: Payer: Medicare Other

## 2019-01-03 DIAGNOSIS — Z20822 Contact with and (suspected) exposure to covid-19: Secondary | ICD-10-CM

## 2019-01-05 ENCOUNTER — Ambulatory Visit: Payer: Medicare Other | Admitting: Internal Medicine

## 2019-01-06 NOTE — Progress Notes (Signed)
Subjective:     Patient ID: Courtney West , female    DOB: 11/08/1950 , 68 y.o.   MRN: [id number]   Chief Complaint  Patient presents with  . Evaluation for portable oxygen    HPI  She presents today to have paperwork completed so DME company can send her a portable oxygen tank. She needed to have six minute walk test with documented oximetry.  She denies fever/chlls/new cough suggestive of COVID infection.  She reports she is still having trouble sleeping. She has no new concerns.     Past Medical History:  Diagnosis Date  . Abdominal aortic aneurysm ([place])   . Allergy    codeine,pcn,sulfa drugs  . Brain cancer ([place])    mets from lung primary  . Clotted vascular catheter ([place]) 03/30/2012  . COPD (chronic obstructive pulmonary disease) ([place])   . Dyslipidemia   . History of radiation therapy 01/19/12, 01/21/12, 01/26/12   RLL lung  . History of radiation therapy 04/18/11   single fraction to 6 brain mets  . Hypertension   . Lung cancer ([place]) 12/2010  . Lung cancer ([place]) 12/22/2010  . Radiation 10/20/2011   frontal mets/Palliation  . S/P radiation therapy 7/12 thru 9/12, 04/18/11   xrt to brain mets  . Stroke [facility])      Family History  Problem Relation Age of Onset  . Cancer Father        lung, leukemia  . Leukemia Father   . Cancer Maternal Aunt        kidney  . Kidney disease Mother      Current Outpatient Medications:  .  acetaminophen (TYLENOL) 500 MG tablet, Take 500 mg by mouth every 6 (six) hours as needed., Disp: , Rfl:  .  budesonide-formoterol (SYMBICORT) 80-4.5 MCG/ACT inhaler, INHALE 2 PUFFS BY MOUTH TWICE DAILY IN THE MORNING AND IN THE EVENING, Disp: 1 Inhaler, Rfl: 5 .  furosemide (LASIX) 20 MG tablet, Take 1 tablet by mouth daily (Patient taking differently: 40 mg. Take 1 tablet by mouth daily), Disp: 90 tablet, Rfl: 1 .  gabapentin (NEURONTIN) 300 MG capsule, TAKE 2 CAPSULES BY MOUTH THREE TIMES DAILY, Disp: 180 capsule, Rfl: 1 .  Melatonin 5 MG CAPS, Take  1 capsule by mouth at bedtime as needed., Disp: , Rfl:  .  PROAIR HFA 108 (90 Base) MCG/ACT inhaler, INHALE 2 PUFFS BY MOUTH EVERY 4 TO 6 HOURS AS NEEDED, Disp: 18 g, Rfl: 11 .  temazepam (RESTORIL) 15 MG capsule, TAKE 1 CAPSULE BY MOUTH AT BEDTIME AS NEEDED FOR SLEEP, Disp: 30 capsule, Rfl: 1 .  traMADol (ULTRAM) 50 MG tablet, Take 1 tablet (50 mg total) by mouth every 6 (six) hours as needed. (Patient not taking: Reported on 12/28/2018), Disp: 20 tablet, Rfl: 0   Allergies  Allergen Reactions  . Sulfa Antibiotics Nausea Only  . Sulfasalazine Nausea Only  . Carboplatin Itching  . Codeine Nausea And Vomiting  . Dilaudid [Hydromorphone Hcl] Nausea Only  . Hydromorphone Hcl Nausea Only  . Penicillins Itching and Rash    Has patient had a PCN reaction causing immediate rash, facial/tongue/throat swelling, SOB or lightheadedness with hypotension: No Has patient had a PCN reaction causing severe rash involving mucus membranes or skin necrosis: No Has patient had a PCN reaction that required hospitalization No Has patient had a PCN reaction occurring within the last 10 years: No If all of the above answers are "NO", then may proceed with Cephalosporin use.  Review of Systems  Constitutional: Negative.   Respiratory: Negative.   Cardiovascular: Negative.   Gastrointestinal: Negative.   Neurological: Negative.   Psychiatric/Behavioral: Negative.      Today's Vitals   12/28/18 1129 12/28/18 1149 12/28/18 1150 12/28/18 1151  BP: 102/60     Pulse: 84     Temp: 98.8 F (37.1 C)     TempSrc: Oral     SpO2: (!) 88% (!) 87% (!) 75% (!) 75%  Weight: 172 lb 6.4 oz (78.2 kg)     Height: 5' 2.2" (1.58 m)      Body mass index is 31.33 kg/m.   Objective:  Physical Exam Vitals signs and nursing note reviewed.  Constitutional:      Appearance: Normal appearance.  HENT:     Head: Normocephalic and atraumatic.  Cardiovascular:     Rate and Rhythm: Normal rate and regular rhythm.      Heart sounds: Normal heart sounds.  Pulmonary:     Effort: Pulmonary effort is normal.     Comments: Decreased breath sounds at bases, few scattered rhonchi Skin:    General: Skin is warm.  Neurological:     General: No focal deficit present.     Mental Status: She is alert.  Psychiatric:        Mood and Affect: Mood normal.        Behavior: Behavior normal.         Assessment And Plan:     1. Chronic obstructive pulmonary disease, unspecified COPD type (HCC)  Chronic, requires supplemental oxygen. Form will be completed for portable oxygen.   2. Primary insomnia  Chronic. She will continue with temazepam prn. She may benefit from newer agent, Dayvigo. I will await Medicare coverage prior to having her try this product.   3. Lung cancer metastatic to brain ([place])  Chronic. Also followed by Oncology. I defer management to Oncology.  4. Aneurysm of infrarenal abdominal aorta (HCC)  Chronic, yet stable.   5. Requires supplemental oxygen  Pulse ox followed before and after ambulation. She is definitely hypoxic with exertion.   6. Tobacco use disorder, continuous  She reports she has cut back the number of cigs smoked per day.  She does not wish to totally quit at this time.   [name], MD    THE PATIENT IS ENCOURAGED TO PRACTICE SOCIAL DISTANCING DUE TO THE COVID-19 PANDEMIC.

## 2019-01-07 ENCOUNTER — Telehealth: Payer: Self-pay

## 2019-01-07 LAB — NOVEL CORONAVIRUS, NAA: SARS-CoV-2, NAA: NOT DETECTED

## 2019-01-07 NOTE — Telephone Encounter (Signed)
Called pt to see if she was using her symbicort regularly . Pt stated that she picks up the symbicort monthly and uses it as needed

## 2019-02-17 ENCOUNTER — Other Ambulatory Visit: Payer: Self-pay | Admitting: Internal Medicine

## 2019-02-17 DIAGNOSIS — F5101 Primary insomnia: Secondary | ICD-10-CM

## 2019-02-17 NOTE — Telephone Encounter (Signed)
Temazepam refill 

## 2019-02-23 ENCOUNTER — Other Ambulatory Visit: Payer: Self-pay | Admitting: Internal Medicine

## 2019-02-23 DIAGNOSIS — G620 Drug-induced polyneuropathy: Secondary | ICD-10-CM

## 2019-02-23 NOTE — Telephone Encounter (Signed)
Would like a refill on Gabapentin (Neurontin) 300mg 

## 2019-03-28 ENCOUNTER — Other Ambulatory Visit: Payer: Self-pay | Admitting: Internal Medicine

## 2019-03-28 DIAGNOSIS — G620 Drug-induced polyneuropathy: Secondary | ICD-10-CM

## 2019-04-19 ENCOUNTER — Other Ambulatory Visit: Payer: Self-pay

## 2019-04-19 DIAGNOSIS — Z20822 Contact with and (suspected) exposure to covid-19: Secondary | ICD-10-CM

## 2019-04-21 LAB — NOVEL CORONAVIRUS, NAA: SARS-CoV-2, NAA: NOT DETECTED

## 2019-04-26 ENCOUNTER — Telehealth: Payer: Self-pay

## 2019-04-26 NOTE — Telephone Encounter (Signed)
Negative COVID results given. Patient results "NOT Detected." Caller expressed understanding. ° °

## 2019-05-01 ENCOUNTER — Other Ambulatory Visit: Payer: Self-pay | Admitting: Internal Medicine

## 2019-05-01 DIAGNOSIS — T451X5A Adverse effect of antineoplastic and immunosuppressive drugs, initial encounter: Secondary | ICD-10-CM

## 2019-05-01 DIAGNOSIS — G620 Drug-induced polyneuropathy: Secondary | ICD-10-CM

## 2019-05-05 ENCOUNTER — Other Ambulatory Visit: Payer: Self-pay | Admitting: Internal Medicine

## 2019-05-05 DIAGNOSIS — T451X5A Adverse effect of antineoplastic and immunosuppressive drugs, initial encounter: Secondary | ICD-10-CM

## 2019-05-05 DIAGNOSIS — G620 Drug-induced polyneuropathy: Secondary | ICD-10-CM

## 2019-05-05 MED ORDER — GABAPENTIN 300 MG PO CAPS: 600.0000 mg | ORAL_CAPSULE | Freq: Three times a day (TID) | ORAL | 3 refills | Status: DC

## 2019-05-05 MED ORDER — TEMAZEPAM 30 MG PO CAPS: 30.0000 mg | ORAL_CAPSULE | Freq: Every evening | ORAL | 2 refills | Status: DC | PRN

## 2019-06-26 ENCOUNTER — Other Ambulatory Visit: Payer: Self-pay

## 2019-06-26 ENCOUNTER — Encounter (HOSPITAL_COMMUNITY): Payer: Self-pay | Admitting: Emergency Medicine

## 2019-06-26 ENCOUNTER — Emergency Department (HOSPITAL_COMMUNITY)
Admission: EM | Admit: 2019-06-26 | Discharge: 2019-06-26 | Disposition: A | Discharge status: Home / Self Care | Payer: Medicare Other | Attending: Emergency Medicine | Admitting: Emergency Medicine

## 2019-06-26 ENCOUNTER — Emergency Department (HOSPITAL_COMMUNITY): Payer: Medicare Other

## 2019-06-26 DIAGNOSIS — Z85118 Personal history of other malignant neoplasm of bronchus and lung: Secondary | ICD-10-CM | POA: Diagnosis not present

## 2019-06-26 DIAGNOSIS — I509 Heart failure, unspecified: Secondary | ICD-10-CM | POA: Diagnosis not present

## 2019-06-26 DIAGNOSIS — F17210 Nicotine dependence, cigarettes, uncomplicated: Secondary | ICD-10-CM | POA: Diagnosis not present

## 2019-06-26 DIAGNOSIS — J441 Chronic obstructive pulmonary disease with (acute) exacerbation: Secondary | ICD-10-CM | POA: Diagnosis not present

## 2019-06-26 DIAGNOSIS — Z8673 Personal history of transient ischemic attack (TIA), and cerebral infarction without residual deficits: Secondary | ICD-10-CM | POA: Diagnosis not present

## 2019-06-26 DIAGNOSIS — I110 Hypertensive heart disease with heart failure: Secondary | ICD-10-CM | POA: Diagnosis not present

## 2019-06-26 DIAGNOSIS — R0602 Shortness of breath: Secondary | ICD-10-CM | POA: Diagnosis present

## 2019-06-26 HISTORY — DX: Heart failure, unspecified: I50.9

## 2019-06-26 LAB — COMPREHENSIVE METABOLIC PANEL
ALT: 15 U/L (ref 0–44)
AST: 14 U/L — ABNORMAL LOW (ref 15–41)
Albumin: 3.4 g/dL — ABNORMAL LOW (ref 3.5–5.0)
Alkaline Phosphatase: 89 U/L (ref 38–126)
Anion gap: 11 (ref 5–15)
BUN: 15 mg/dL (ref 8–23)
CO2: 25 mmol/L (ref 22–32)
Calcium: 9.6 mg/dL (ref 8.9–10.3)
Chloride: 102 mmol/L (ref 98–111)
Creatinine, Ser: 0.66 mg/dL (ref 0.44–1.00)
GFR calc Af Amer: >60 mL/min (ref >60)
GFR calc non Af Amer: >60 mL/min (ref >60)
Glucose, Bld: 103 mg/dL — ABNORMAL HIGH (ref 70–99)
Potassium: 3.8 mmol/L (ref 3.5–5.1)
Sodium: 138 mmol/L (ref 135–145)
Total Bilirubin: 0.3 mg/dL (ref 0.3–1.2)
Total Protein: 7.1 g/dL (ref 6.5–8.1)

## 2019-06-26 LAB — TROPONIN I (HIGH SENSITIVITY)
Troponin I (High Sensitivity): 20 ng/L — ABNORMAL HIGH (ref <18)
Troponin I (High Sensitivity): 20 ng/L — ABNORMAL HIGH (ref <18)

## 2019-06-26 LAB — CBC WITH DIFFERENTIAL/PLATELET
Abs Immature Granulocytes: 0.02 10*3/uL (ref 0.00–0.07)
Basophils Absolute: 0.1 10*3/uL (ref 0.0–0.1)
Basophils Relative: 1 %
Eosinophils Absolute: 0.1 10*3/uL (ref 0.0–0.5)
Eosinophils Relative: 2 %
HCT: 51.1 % — ABNORMAL HIGH (ref 36.0–46.0)
Hemoglobin: 16.3 g/dL — ABNORMAL HIGH (ref 12.0–15.0)
Immature Granulocytes: 0 %
Lymphocytes Relative: 31 %
Lymphs Abs: 2.8 10*3/uL (ref 0.7–4.0)
MCH: 30.8 pg (ref 26.0–34.0)
MCHC: 31.9 g/dL (ref 30.0–36.0)
MCV: 96.6 fL (ref 80.0–100.0)
Monocytes Absolute: 0.6 10*3/uL (ref 0.1–1.0)
Monocytes Relative: 7 %
Neutro Abs: 5.4 10*3/uL (ref 1.7–7.7)
Neutrophils Relative %: 59 %
Platelets: 180 10*3/uL (ref 150–400)
RBC: 5.29 MIL/uL — ABNORMAL HIGH (ref 3.87–5.11)
RDW: 12.3 % (ref 11.5–15.5)
WBC: 9.1 10*3/uL (ref 4.0–10.5)
nRBC: 0 % (ref 0.0–0.2)

## 2019-06-26 LAB — URINALYSIS, ROUTINE W REFLEX MICROSCOPIC
Bilirubin Urine: NEGATIVE
Glucose, UA: NEGATIVE mg/dL
Hgb urine dipstick: NEGATIVE
Ketones, ur: NEGATIVE mg/dL
Nitrite: NEGATIVE
Protein, ur: NEGATIVE mg/dL
Specific Gravity, Urine: <1.005 — ABNORMAL LOW (ref 1.005–1.030)
pH: 6 (ref 5.0–8.0)

## 2019-06-26 LAB — URINALYSIS, MICROSCOPIC (REFLEX): Bacteria, UA: NONE SEEN

## 2019-06-26 LAB — BRAIN NATRIURETIC PEPTIDE: B Natriuretic Peptide: 188 pg/mL — ABNORMAL HIGH (ref 0.0–100.0)

## 2019-06-26 MED ORDER — PREDNISONE 10 MG PO TABS: 20.0000 mg | ORAL_TABLET | Freq: Every day | ORAL | 0 refills | Status: AC

## 2019-06-26 MED ORDER — ALBUTEROL SULFATE HFA 108 (90 BASE) MCG/ACT IN AERS: 1.0000 | INHALATION_SPRAY | Freq: Four times a day (QID) | RESPIRATORY_TRACT | 0 refills | Status: AC | PRN

## 2019-06-26 MED ORDER — DOXYCYCLINE HYCLATE 100 MG PO CAPS: 100.0000 mg | ORAL_CAPSULE | Freq: Two times a day (BID) | ORAL | 0 refills | Status: AC

## 2019-06-26 MED ORDER — ALBUTEROL SULFATE HFA 108 (90 BASE) MCG/ACT IN AERS
4.0000 | INHALATION_SPRAY | Freq: Once | RESPIRATORY_TRACT | Status: AC
  Administered 2019-06-26: 4 via RESPIRATORY_TRACT
  Filled 2019-06-26: qty 6.7

## 2019-06-26 NOTE — ED Notes (Signed)
Post void residual <85ml. Pre void was 267ml.

## 2019-06-26 NOTE — ED Provider Notes (Addendum)
MSE was initiated and I personally evaluated the patient and placed orders (if any) at  6:55 AM on June 26, 2019.  Multiple medical problems to include heart failure, COPD, lung cancer, brain cancer presents the emerge department today with difficulty urinating and also shortness of breath.  States compliance with her medications.  No fevers.  Saturation was 89% slightly tachypneic.  Lungs are clear.  We will put in preliminary orders.  The patient appears stable so that the remainder of the MSE may be completed by another provider.   Jailynn Lavalais, Corene Cornea, MD 06/26/19 [phone]

## 2019-06-26 NOTE — ED Notes (Signed)
Pt placed on her normal 2L of oxygen with improvement in sats.
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
## 2019-06-26 NOTE — ED Triage Notes (Signed)
Pt has not been taking her fluid pills like she is ordered x3 days too due to not being able to urinate "very well". Pt c/o some shob since not taking her fluid pills as ordered.

## 2019-06-26 NOTE — Discharge Instructions (Addendum)
You were seen in the emergency department today with shortness of breath.  I feel this may be related to a COPD exacerbation.  There is question of possible pneumonia on your chest x-ray.  I am sending off an additional test for coronavirus.  Please follow this test result in MyChart administration until your test result comes back.

## 2019-06-26 NOTE — ED Notes (Signed)
PT left before getting her send out covid test or d/c papers.

## 2019-06-30 ENCOUNTER — Ambulatory Visit (INDEPENDENT_AMBULATORY_CARE_PROVIDER_SITE_OTHER): Payer: Medicare Other | Admitting: Internal Medicine

## 2019-06-30 ENCOUNTER — Other Ambulatory Visit: Payer: Self-pay

## 2019-06-30 ENCOUNTER — Encounter: Payer: Self-pay | Admitting: Internal Medicine

## 2019-06-30 VITALS — BP 114/66 | HR 83 | Temp 98.3°F | Ht 63.0 in | Wt 159.0 lb

## 2019-06-30 DIAGNOSIS — R3911 Hesitancy of micturition: Secondary | ICD-10-CM

## 2019-06-30 DIAGNOSIS — J449 Chronic obstructive pulmonary disease, unspecified: Secondary | ICD-10-CM | POA: Diagnosis not present

## 2019-06-30 DIAGNOSIS — C7931 Secondary malignant neoplasm of brain: Secondary | ICD-10-CM

## 2019-06-30 DIAGNOSIS — J189 Pneumonia, unspecified organism: Secondary | ICD-10-CM

## 2019-06-30 DIAGNOSIS — C3490 Malignant neoplasm of unspecified part of unspecified bronchus or lung: Secondary | ICD-10-CM

## 2019-06-30 DIAGNOSIS — F5101 Primary insomnia: Secondary | ICD-10-CM | POA: Diagnosis not present

## 2019-06-30 DIAGNOSIS — Z716 Tobacco abuse counseling: Secondary | ICD-10-CM

## 2019-06-30 DIAGNOSIS — T451X5A Adverse effect of antineoplastic and immunosuppressive drugs, initial encounter: Secondary | ICD-10-CM

## 2019-06-30 DIAGNOSIS — G620 Drug-induced polyneuropathy: Secondary | ICD-10-CM

## 2019-06-30 LAB — POCT URINALYSIS DIPSTICK
Bilirubin, UA: NEGATIVE
Blood, UA: NEGATIVE
Glucose, UA: NEGATIVE
Ketones, UA: NEGATIVE
Leukocytes, UA: NEGATIVE
Nitrite, UA: NEGATIVE
Protein, UA: NEGATIVE
Spec Grav, UA: <=1.005 — AB (ref 1.010–1.025)
Urobilinogen, UA: 0.2 E.U./dL
pH, UA: 5.5 (ref 5.0–8.0)

## 2019-06-30 MED ORDER — BUDESONIDE-FORMOTEROL FUMARATE 80-4.5 MCG/ACT IN AERO: INHALATION_SPRAY | RESPIRATORY_TRACT | 5 refills | Status: DC

## 2019-06-30 MED ORDER — TEMAZEPAM 30 MG PO CAPS: 30.0000 mg | ORAL_CAPSULE | Freq: Every evening | ORAL | 2 refills | Status: AC | PRN

## 2019-06-30 MED ORDER — GABAPENTIN 300 MG PO CAPS: 600.0000 mg | ORAL_CAPSULE | Freq: Three times a day (TID) | ORAL | 3 refills | Status: AC

## 2019-06-30 NOTE — Patient Instructions (Signed)
Chronic Obstructive Pulmonary Disease Chronic obstructive pulmonary disease (COPD) is a long-term (chronic) lung problem. When you have COPD, it is hard for air to get in and out of your lungs. Usually the condition gets worse over time, and your lungs will never return to normal. There are things you can do to keep yourself as healthy as possible.  Your doctor may treat your condition with: ? Medicines. ? Oxygen. ? Lung surgery.  Your doctor may also recommend: ? Rehabilitation. This includes steps to make your body work better. It may involve a team of specialists. ? Quitting smoking, if you smoke. ? Exercise and changes to your diet. ? Comfort measures (palliative care). Follow these instructions at home: Medicines  Take over-the-counter and prescription medicines only as told by your doctor.  Talk to your doctor before taking any cough or allergy medicines. You may need to avoid medicines that cause your lungs to be dry. Lifestyle  If you smoke, stop. Smoking makes the problem worse. If you need help quitting, ask your doctor.  Avoid being around things that make your breathing worse. This may include smoke, chemicals, and fumes.  Stay active, but remember to rest as well.  Learn and use tips on how to relax.  Make sure you get enough sleep. Most adults need at least 7 hours of sleep every night.  Eat healthy foods. Eat smaller meals more often. Rest before meals. Controlled breathing Learn and use tips on how to control your breathing as told by your doctor. Try:  Breathing in (inhaling) through your nose for 1 second. Then, pucker your lips and breath out (exhale) through your lips for 2 seconds.  Putting one hand on your belly (abdomen). Breathe in slowly through your nose for 1 second. Your hand on your belly should move out. Pucker your lips and breathe out slowly through your lips. Your hand on your belly should move in as you breathe out.  Controlled coughing Learn  and use controlled coughing to clear mucus from your lungs. Follow these steps: 1. Lean your head a little forward. 2. Breathe in deeply. 3. Try to hold your breath for 3 seconds. 4. Keep your mouth slightly open while coughing 2 times. 5. Spit any mucus out into a tissue. 6. Rest and do the steps again 1 or 2 times as needed. General instructions  Make sure you get all the shots (vaccines) that your doctor recommends. Ask your doctor about a flu shot and a pneumonia shot.  Use oxygen therapy and pulmonary rehabilitation if told by your doctor. If you need home oxygen therapy, ask your doctor if you should buy a tool to measure your oxygen level (oximeter).  Make a COPD action plan with your doctor. This helps you to know what to do if you feel worse than usual.  Manage any other conditions you have as told by your doctor.  Avoid going outside when it is very hot, cold, or humid.  Avoid people who have a sickness you can catch (contagious).  Keep all follow-up visits as told by your doctor. This is important. Contact a doctor if:  You cough up more mucus than usual.  There is a change in the color or thickness of the mucus.  It is harder to breathe than usual.  Your breathing is faster than usual.  You have trouble sleeping.  You need to use your medicines more often than usual.  You have trouble doing your normal activities such as getting dressed   or walking around the house. Get help right away if:  You have shortness of breath while resting.  You have shortness of breath that stops you from: ? Being able to talk. ? Doing normal activities.  Your chest hurts for longer than 5 minutes.  Your skin color is more blue than usual.  Your pulse oximeter shows that you have low oxygen for longer than 5 minutes.  You have a fever.  You feel too tired to breathe normally. Summary  Chronic obstructive pulmonary disease (COPD) is a long-term lung problem.  The way your  lungs work will never return to normal. Usually the condition gets worse over time. There are things you can do to keep yourself as healthy as possible.  Take over-the-counter and prescription medicines only as told by your doctor.  If you smoke, stop. Smoking makes the problem worse. This information is not intended to replace advice given to you by your health care provider. Make sure you discuss any questions you have with your health care provider. Document Revised: 05/22/2017 Document Reviewed: 07/14/2016 Elsevier Patient Education  2020 Elsevier Inc.  

## 2019-07-01 NOTE — Progress Notes (Signed)
This visit occurred during the SARS-CoV-2 public health emergency.  Safety protocols were in place, including screening questions prior to the visit, additional usage of staff PPE, and extensive cleaning of exam room while observing appropriate contact time as indicated for disinfecting solutions.  Subjective:     Patient ID: Courtney West , female    DOB: September 17, 1950 , 69 y.o.   MRN: [id number]   Chief Complaint  Patient presents with  . copd    HPI  She is here today for f/u COPD.  She reports compliance with her inhalers. She admits that she is still smoking, but is happy to report that she is down to 10 cigs/day. She went to [facility] ER on 1/3 for further evaluation of shortness of breath. She was given prednisone for COPD exacerbation and doxycycline for possible pneumonia.  CXR results revealed Interstitial and ill-defined airspace opacity within the mid to lower lung fields (greater on the right). This may reflect edema or infection. Radiographic follow-up to resolution recommended. Redemonstrated right infrahilar mass versus post radiation changes. Redemonstrated left perihilar post radiation changes. A known left upper lobe pulmonary nodule is poorly reassessed on the current study. Cardiomegaly unchanged.  Aortic atherosclerosis.   She reports compliance with meds and reports feeling better at this time. She denies fever/chills.     Past Medical History:  Diagnosis Date  . Abdominal aortic aneurysm ([place])   . Allergy    codeine,pcn,sulfa drugs  . Brain cancer ([place])    mets from lung primary  . CHF (congestive heart failure) ([place])   . Clotted vascular catheter ([place]) 03/30/2012  . COPD (chronic obstructive pulmonary disease) ([place])   . Dyslipidemia   . History of radiation therapy 01/19/12, 01/21/12, 01/26/12   RLL lung  . History of radiation therapy 04/18/11   single fraction to 6 brain mets  . Hypertension   . Lung cancer ([place]) 12/2010  . Lung cancer ([place]) 12/22/2010  . Radiation  10/20/2011   frontal mets/Palliation  . S/P radiation therapy 7/12 thru 9/12, 04/18/11   xrt to brain mets  . Stroke [facility])      Family History  Problem Relation Age of Onset  . Cancer Father        lung, leukemia  . Leukemia Father   . Cancer Maternal Aunt        kidney  . Kidney disease Mother      Current Outpatient Medications:  .  acetaminophen (TYLENOL) 500 MG tablet, Take 500 mg by mouth every 6 (six) hours as needed., Disp: , Rfl:  .  albuterol (VENTOLIN HFA) 108 (90 Base) MCG/ACT inhaler, Inhale 1-2 puffs into the lungs every 6 (six) hours as needed for wheezing or shortness of breath., Disp: 6.7 g, Rfl: 0 .  budesonide-formoterol (SYMBICORT) 80-4.5 MCG/ACT inhaler, INHALE 2 PUFFS BY MOUTH TWICE DAILY IN THE MORNING AND IN THE EVENING, Disp: 1 Inhaler, Rfl: 5 .  doxycycline (VIBRAMYCIN) 100 MG capsule, Take 1 capsule (100 mg total) by mouth 2 (two) times daily for 7 days., Disp: 14 capsule, Rfl: 0 .  furosemide (LASIX) 20 MG tablet, Take 1 tablet by mouth daily (Patient taking differently: 40 mg. Take 1 tablet by mouth daily), Disp: 90 tablet, Rfl: 1 .  gabapentin (NEURONTIN) 300 MG capsule, Take 2 capsules (600 mg total) by mouth 3 (three) times daily., Disp: 180 capsule, Rfl: 3 .  Melatonin 5 MG CAPS, Take 1 capsule by mouth at bedtime as needed., Disp: , Rfl:  .  predniSONE (DELTASONE) 10 MG tablet, Take 2 tablets (20 mg total) by mouth daily for 5 days., Disp: 10 tablet, Rfl: 0 .  PROAIR HFA 108 (90 Base) MCG/ACT inhaler, INHALE 2 PUFFS BY MOUTH EVERY 4 TO 6 HOURS AS NEEDED, Disp: 18 g, Rfl: 11 .  temazepam (RESTORIL) 30 MG capsule, Take 1 capsule (30 mg total) by mouth at bedtime as needed for sleep., Disp: 30 capsule, Rfl: 2 .  traMADol (ULTRAM) 50 MG tablet, Take 1 tablet (50 mg total) by mouth every 6 (six) hours as needed., Disp: 20 tablet, Rfl: 0   Allergies  Allergen Reactions  . Sulfa Antibiotics Nausea Only  . Sulfasalazine Nausea Only  . Carboplatin Itching  .  Codeine Nausea And Vomiting  . Dilaudid [Hydromorphone Hcl] Nausea Only  . Hydromorphone Hcl Nausea Only  . Penicillins Itching and Rash    Has patient had a PCN reaction causing immediate rash, facial/tongue/throat swelling, SOB or lightheadedness with hypotension: No Has patient had a PCN reaction causing severe rash involving mucus membranes or skin necrosis: No Has patient had a PCN reaction that required hospitalization No Has patient had a PCN reaction occurring within the last 10 years: No If all of the above answers are "NO", then may proceed with Cephalosporin use.      Review of Systems  Constitutional: Negative.   Respiratory: Negative.   Cardiovascular: Negative.   Gastrointestinal: Negative.   Neurological: Negative.   Psychiatric/Behavioral: Negative.      Today's Vitals   06/30/19 1141  BP: 114/66  Pulse: 83  Temp: 98.3 F (36.8 C)  TempSrc: Oral  SpO2: 91%  Weight: 159 lb (72.1 kg)  Height: 5\' 3"  (1.6 m)  PainSc: 0-No pain   Body mass index is 28.17 kg/m.   Objective:  Physical Exam Vitals and nursing note reviewed.  Constitutional:      Appearance: Normal appearance.  HENT:     Head: Normocephalic and atraumatic.  Cardiovascular:     Rate and Rhythm: Normal rate and regular rhythm.     Heart sounds: Normal heart sounds.  Pulmonary:     Effort: Pulmonary effort is normal.     Comments: Decreased breath sounds at bases Skin:    General: Skin is warm.  Neurological:     General: No focal deficit present.     Mental Status: She is alert.  Psychiatric:        Mood and Affect: Mood normal.        Behavior: Behavior normal.       Assessment And Plan:     1. Chronic obstructive pulmonary disease, unspecified COPD type ([place])  Chronic, recovering from recent exacerbation. She is encouraged to complete full course of prednisone and doxycycline.   2. Community acquired pneumonia, unspecified laterality  Acute. ER records reviewed in full detail  during her visit.  She is encouraged to complete full antibiotic course.   3. Primary insomnia  Chronic. She was given refill of temazepam to use prn.   4. Urinary hesitancy  I will check u/a to r/o UTI.   - POCT Urinalysis Dipstick (81002)  5. Chemotherapy-induced neuropathy (HCC)  Chronic, symptoms somewhat improved with gabapentin. She was given refill as requested.   - gabapentin (NEURONTIN) 300 MG capsule; Take 2 capsules (600 mg total) by mouth 3 (three) times daily.  Dispense: 180 capsule; Refill: 3  6. Lung cancer metastatic to brain ([place])  Chronic.   7. Tobacco abuse counseling  Smoking cessation instruction/counseling given:  counseled patient on the dangers of tobacco use, advised patient to stop smoking, and reviewed strategies to maximize success . She was congratulated for cutting back to 1/2ppd and encouraged to continue to cut back on number of cigs smoked per day.  [name], MD    THE PATIENT IS ENCOURAGED TO PRACTICE SOCIAL DISTANCING DUE TO THE COVID-19 PANDEMIC.

## 2019-08-22 ENCOUNTER — Other Ambulatory Visit: Payer: Medicare Other

## 2019-08-24 ENCOUNTER — Ambulatory Visit (HOSPITAL_COMMUNITY)
Admission: RE | Admit: 2019-08-24 | Discharge: 2019-08-24 | Disposition: A | Discharge status: Home / Self Care | Payer: Medicare Other | Source: Ambulatory Visit | Attending: Internal Medicine | Admitting: Internal Medicine

## 2019-08-24 ENCOUNTER — Inpatient Hospital Stay: Payer: Medicare Other | Attending: Internal Medicine

## 2019-08-24 ENCOUNTER — Other Ambulatory Visit: Payer: Self-pay

## 2019-08-24 ENCOUNTER — Encounter (HOSPITAL_COMMUNITY): Payer: Self-pay

## 2019-08-24 DIAGNOSIS — Z923 Personal history of irradiation: Secondary | ICD-10-CM | POA: Insufficient documentation

## 2019-08-24 DIAGNOSIS — Z85841 Personal history of malignant neoplasm of brain: Secondary | ICD-10-CM | POA: Diagnosis not present

## 2019-08-24 DIAGNOSIS — C3490 Malignant neoplasm of unspecified part of unspecified bronchus or lung: Secondary | ICD-10-CM

## 2019-08-24 DIAGNOSIS — Z85118 Personal history of other malignant neoplasm of bronchus and lung: Secondary | ICD-10-CM | POA: Diagnosis not present

## 2019-08-24 DIAGNOSIS — Z9221 Personal history of antineoplastic chemotherapy: Secondary | ICD-10-CM | POA: Insufficient documentation

## 2019-08-24 LAB — CBC WITH DIFFERENTIAL (CANCER CENTER ONLY)
Abs Immature Granulocytes: 0.02 10*3/uL (ref 0.00–0.07)
Basophils Absolute: 0.1 10*3/uL (ref 0.0–0.1)
Basophils Relative: 1 %
Eosinophils Absolute: 0.1 10*3/uL (ref 0.0–0.5)
Eosinophils Relative: 1 %
HCT: 48.6 % — ABNORMAL HIGH (ref 36.0–46.0)
Hemoglobin: 15.9 g/dL — ABNORMAL HIGH (ref 12.0–15.0)
Immature Granulocytes: 0 %
Lymphocytes Relative: 23 %
Lymphs Abs: 2.2 10*3/uL (ref 0.7–4.0)
MCH: 30.6 pg (ref 26.0–34.0)
MCHC: 32.7 g/dL (ref 30.0–36.0)
MCV: 93.5 fL (ref 80.0–100.0)
Monocytes Absolute: 0.6 10*3/uL (ref 0.1–1.0)
Monocytes Relative: 7 %
Neutro Abs: 6.4 10*3/uL (ref 1.7–7.7)
Neutrophils Relative %: 68 %
Platelet Count: 195 10*3/uL (ref 150–400)
RBC: 5.2 MIL/uL — ABNORMAL HIGH (ref 3.87–5.11)
RDW: 12.7 % (ref 11.5–15.5)
WBC Count: 9.4 10*3/uL (ref 4.0–10.5)
nRBC: 0 % (ref 0.0–0.2)

## 2019-08-24 LAB — CMP (CANCER CENTER ONLY)
ALT: 12 U/L (ref 0–44)
AST: 17 U/L (ref 15–41)
Albumin: 3.2 g/dL — ABNORMAL LOW (ref 3.5–5.0)
Alkaline Phosphatase: 111 U/L (ref 38–126)
Anion gap: 10 (ref 5–15)
BUN: 15 mg/dL (ref 8–23)
CO2: 30 mmol/L (ref 22–32)
Calcium: 9.3 mg/dL (ref 8.9–10.3)
Chloride: 99 mmol/L (ref 98–111)
Creatinine: 0.73 mg/dL (ref 0.44–1.00)
GFR, Est AFR Am: >60 mL/min (ref >60)
GFR, Estimated: >60 mL/min (ref >60)
Glucose, Bld: 93 mg/dL (ref 70–99)
Potassium: 4.7 mmol/L (ref 3.5–5.1)
Sodium: 139 mmol/L (ref 135–145)
Total Bilirubin: 0.4 mg/dL (ref 0.3–1.2)
Total Protein: 7.1 g/dL (ref 6.5–8.1)

## 2019-08-24 MED ORDER — SODIUM CHLORIDE (PF) 0.9 % IJ SOLN
INTRAMUSCULAR | Status: AC
  Filled 2019-08-24: qty 50

## 2019-08-24 MED ORDER — IOHEXOL 300 MG/ML  SOLN
100.0000 mL | Freq: Once | INTRAMUSCULAR | Status: AC | PRN
  Administered 2019-08-24: 100 mL via INTRAVENOUS

## 2019-08-25 ENCOUNTER — Encounter: Payer: Self-pay | Admitting: Internal Medicine

## 2019-08-25 ENCOUNTER — Inpatient Hospital Stay (HOSPITAL_BASED_OUTPATIENT_CLINIC_OR_DEPARTMENT_OTHER): Payer: Medicare Other | Admitting: Internal Medicine

## 2019-08-25 ENCOUNTER — Telehealth: Payer: Self-pay | Admitting: Internal Medicine

## 2019-08-25 ENCOUNTER — Other Ambulatory Visit: Payer: Self-pay

## 2019-08-25 VITALS — BP 100/62 | HR 103 | Temp 98.2°F | Resp 18 | Ht 63.0 in | Wt 153.4 lb

## 2019-08-25 DIAGNOSIS — C3490 Malignant neoplasm of unspecified part of unspecified bronchus or lung: Secondary | ICD-10-CM

## 2019-08-25 DIAGNOSIS — C3491 Malignant neoplasm of unspecified part of right bronchus or lung: Secondary | ICD-10-CM | POA: Diagnosis not present

## 2019-08-25 DIAGNOSIS — Z720 Tobacco use: Secondary | ICD-10-CM

## 2019-08-25 DIAGNOSIS — D751 Secondary polycythemia: Secondary | ICD-10-CM | POA: Diagnosis not present

## 2019-08-25 DIAGNOSIS — Z85118 Personal history of other malignant neoplasm of bronchus and lung: Secondary | ICD-10-CM | POA: Diagnosis not present

## 2019-08-25 NOTE — Telephone Encounter (Signed)
Scheduled per los. Gave avs and calendar  

## 2019-08-25 NOTE — Progress Notes (Signed)
[place] [phone]   [phone]  OFFICE PROGRESS NOTE  [place] [address], [address] [place] [address]  DIAGNOSIS: Metastatic non-small cell lung cancer, favoring adenocarcinoma diagnosed in July of 2012, presented with locally advanced disease in the chest as well as brain metastasis. Veristrat test poor.   PRIOR THERAPY:  1. Status post stereotactic radiotherapy to 2 brain lesions under the care of Dr. Lisbeth Renshaw. 2. Status post a course of concurrent chemoradiation with weekly carboplatin and paclitaxel, last dose of chemotherapy was given 03/05/2011. 3. Systemic chemotherapy with carboplatin for AUC of 5 and Alimta 500 mg/M2. The patient is status post 3 cycles. Last dose was given 06/03/2011  CURRENT THERAPY: Observation.  INTERVAL HISTORY: Courtney West 69 y.o. female returns to the clinic today for annual follow-up visit.  The patient is feeling fine today with no concerning complaints except for fatigue and shortness of breath with exertion.  She had a lot of diarrhea after drinking the oral contrast for the scan.  The patient denied having any current chest pain, cough or hemoptysis.  Unfortunately she continues to smoke around 1 pack every day.  She denied having any nausea, vomiting, diarrhea or constipation.  She denied having any fever or chills.  The patient had repeat CT scan of the chest, abdomen pelvis performed recently and she is here for evaluation and discussion of her risk her results.  MEDICAL HISTORY: Past Medical History:  Diagnosis Date  . Abdominal aortic aneurysm ([place])   . Allergy    codeine,pcn,sulfa drugs  . Brain cancer ([place])    mets from lung primary  . CHF (congestive heart failure) ([place])   . Clotted vascular catheter ([place]) 03/30/2012  . COPD (chronic obstructive pulmonary disease) ([place])   . Dyslipidemia   . History of radiation therapy 01/19/12, 01/21/12, 01/26/12   RLL lung  . History of  radiation therapy 04/18/11   single fraction to 6 brain mets  . Hypertension   . Lung cancer ([place]) 12/2010  . Lung cancer ([place]) 12/22/2010  . Radiation 10/20/2011   frontal mets/Palliation  . S/P radiation therapy 7/12 thru 9/12, 04/18/11   xrt to brain mets  . Stroke [facility])     ALLERGIES:  is allergic to sulfa antibiotics; sulfasalazine; carboplatin; codeine; dilaudid [hydromorphone hcl]; hydromorphone hcl; and penicillins.  MEDICATIONS:  Current Outpatient Medications  Medication Sig Dispense Refill  . acetaminophen (TYLENOL) 500 MG tablet Take 500 mg by mouth every 6 (six) hours as needed.    Marland Kitchen albuterol (VENTOLIN HFA) 108 (90 Base) MCG/ACT inhaler Inhale 1-2 puffs into the lungs every 6 (six) hours as needed for wheezing or shortness of breath. 6.7 g 0  . budesonide-formoterol (SYMBICORT) 80-4.5 MCG/ACT inhaler INHALE 2 PUFFS BY MOUTH TWICE DAILY IN THE MORNING AND IN THE EVENING 1 Inhaler 5  . furosemide (LASIX) 20 MG tablet Take 1 tablet by mouth daily (Patient taking differently: 40 mg. Take 1 tablet by mouth daily) 90 tablet 1  . gabapentin (NEURONTIN) 300 MG capsule Take 2 capsules (600 mg total) by mouth 3 (three) times daily. 180 capsule 3  . Melatonin 5 MG CAPS Take 1 capsule by mouth at bedtime as needed.    Marland Kitchen PROAIR HFA 108 (90 Base) MCG/ACT inhaler INHALE 2 PUFFS BY MOUTH EVERY 4 TO 6 HOURS AS NEEDED 18 g 11  . temazepam (RESTORIL) 30 MG capsule Take 1 capsule (30 mg total) by mouth at bedtime  as needed for sleep. 30 capsule 2  . traMADol (ULTRAM) 50 MG tablet Take 1 tablet (50 mg total) by mouth every 6 (six) hours as needed. 20 tablet 0   No current facility-administered medications for this visit.    SURGICAL HISTORY:  Past Surgical History:  Procedure Laterality Date  . CARPAL TUNNEL RELEASE    . CHOLECYSTECTOMY    . left knee surgery    . PARTIAL HYSTERECTOMY      REVIEW OF SYSTEMS:  Constitutional: positive for fatigue Eyes: negative Ears, nose, mouth, throat,  and face: negative Respiratory: positive for dyspnea on exertion Cardiovascular: negative Gastrointestinal: negative Genitourinary:negative Integument/breast: negative Hematologic/lymphatic: negative Musculoskeletal:negative Neurological: negative Behavioral/Psych: negative Endocrine: negative Allergic/Immunologic: negative   PHYSICAL EXAMINATION: General appearance: alert, cooperative, fatigued and no distress Head: Normocephalic, without obvious abnormality, atraumatic Neck: no adenopathy, no JVD, supple, symmetrical, trachea midline and thyroid not enlarged, symmetric, no tenderness/mass/nodules Lymph nodes: Cervical, supraclavicular, and axillary nodes normal. Resp: clear to auscultation bilaterally Back: symmetric, no curvature. ROM normal. No CVA tenderness. Cardio: regular rate and rhythm, S1, S2 normal, no murmur, click, rub or gallop GI: soft, non-tender; bowel sounds normal; no masses,  no organomegaly Extremities: extremities normal, atraumatic, no cyanosis or edema Neurologic: Alert and oriented X 3, normal strength and tone. Normal symmetric reflexes. Normal coordination and gait  ECOG PERFORMANCE STATUS: 1 - Symptomatic but completely ambulatory  Blood pressure 100/62, pulse (!) 103, temperature 98.2 F (36.8 C), temperature source Temporal, resp. rate 18, height 5' 3"  (1.6 m), weight 153 lb 6.4 oz (69.6 kg), SpO2 92 %.  LABORATORY DATA: Lab Results  Component Value Date   WBC 9.4 08/24/2019   HGB 15.9 (H) 08/24/2019   HCT 48.6 (H) 08/24/2019   MCV 93.5 08/24/2019   PLT 195 08/24/2019      Chemistry      Component Value Date/Time   NA 139 08/24/2019 1221   NA 136 07/06/2018 1606   NA 141 12/23/2016 0856   K 4.7 08/24/2019 1221   K 4.3 12/23/2016 0856   CL 99 08/24/2019 1221   CL 106 10/06/2012 1303   CO2 30 08/24/2019 1221   CO2 25 12/23/2016 0856   BUN 15 08/24/2019 1221   BUN 11 07/06/2018 1606   BUN 20.6 12/23/2016 0856   CREATININE 0.73  08/24/2019 1221   CREATININE 0.9 12/23/2016 0856      Component Value Date/Time   CALCIUM 9.3 08/24/2019 1221   CALCIUM 10.0 12/23/2016 0856   ALKPHOS 111 08/24/2019 1221   ALKPHOS 83 12/23/2016 0856   AST 17 08/24/2019 1221   AST 12 12/23/2016 0856   ALT 12 08/24/2019 1221   ALT 10 12/23/2016 0856   BILITOT 0.4 08/24/2019 1221   BILITOT 0.42 12/23/2016 0856       RADIOGRAPHIC STUDIES: CT Chest W Contrast  Result Date: 08/24/2019 CLINICAL DATA:  Restaging of metastatic non-small cell lung cancer. Chronic cough and shortness of breath. Lower abdominal pain for 1 month. EXAM: CT CHEST, ABDOMEN, AND PELVIS WITH CONTRAST TECHNIQUE: Multidetector CT imaging of the chest, abdomen and pelvis was performed following the standard protocol during bolus administration of intravenous contrast. CONTRAST:  157m OMNIPAQUE IOHEXOL 300 MG/ML  SOLN COMPARISON:  08/24/2018 FINDINGS: CT CHEST FINDINGS Cardiovascular: Right Port-A-Cath tip: SVC. Coronary, aortic arch, and branch vessel atherosclerotic vascular disease. Chronic large pericardial effusion. Mediastinum/Nodes: Right lower paratracheal node 1.2 cm in short axis on image 24/2, previously 1.3 cm. Right suprahilar node 1.2 cm in short  axis also on image 24/2, previously the same. Stable borderline prominent right hilar lymph node. Lungs/Pleura: Severe centrilobular emphysema. Old granulomatous disease. Consolidated region in the left lower lobe measuring 5.1 cm in short axis, previously 4.8 cm. This has some lobularity of its margins and a vertical excursion of 2.5 cm, previously 1.8 cm, compatible with mild increase in size. Surrounding architectural distortion may be from prior radiation therapy. Pleural-based nodule posteriorly in the right upper lobe is stable at 0.4 cm in diameter on image 39/6. An anterior left upper lobe part solid nodule measures 1.3 by 0.8 cm, previously 1.1 by 0.7 cm Left perihilar bandlike opacity is likely radiation therapy  related. Musculoskeletal: Posterior fractures of the right seventh and eighth ribs are associated with increased osteoid deposition compared to the prior exam, as well as sclerosis in the ribs adjacent to the fractures. These may well be secondary to radiation necrosis. CT ABDOMEN PELVIS FINDINGS Hepatobiliary: Cholecystectomy. Otherwise unremarkable. Pancreas: Unremarkable Spleen: Stable calcifications along the lateral splenic capsule. No new findings. Adrenals/Urinary Tract: Unremarkable Stomach/Bowel: Unremarkable Vascular/Lymphatic: Infrarenal abdominal aortic aneurysm traversed by aorta bi-iliac stent graft. No pathologic adenopathy identified. Reproductive: 4.8 by 5.6 by 3.33 cm complex right adnexal lesion, potentially a mass, significant increased in prominence compared to the prior exam. Uterus absent. Other: Small amount of suspected fat necrosis in the lower pelvic mesentery just above the left adnexa. Musculoskeletal: Chronic compression fractures at L2 and L4 with kyphotic angulation at the L1-2 level. IMPRESSION: 1. Mild increase in size of the left lower lobe mass, a component of active malignancy is not excluded. PET-CT might be considered. Stable mild right suprahilar and right lower paratracheal adenopathy. 2. Interval enlargement of a right adnexal mass which could be metastatic or a primary ovarian lesion. This might be further worked up with pelvic sonography or PET-CT. 3. Mild increase in size of a part solid nodule in the anterior left upper lobe, low-grade adenocarcinoma is a possibility. 4. Increased osteoid deposition adjacent to the chronic posterior right seventh and eighth fractures. The underlying fractures could possibly be secondary to radiation necrosis. 5. Chronic large pericardial effusion. 6. Other imaging findings of potential clinical significance: Coronary atherosclerosis. Chronic compression fractures at L2 and L4. Infrarenal abdominal aortic aneurysm traversed by aorta  bi-iliac stent graft. Small amount of fat necrosis in the lower pelvic mesentery just above the left adnexa. Aortic Atherosclerosis (ICD10-I70.0) and Emphysema (ICD10-J43.9). Electronically Signed   By: Van Clines M.D.   On: 08/24/2019 14:57   CT Abdomen Pelvis W Contrast  Result Date: 08/24/2019 CLINICAL DATA:  Restaging of metastatic non-small cell lung cancer. Chronic cough and shortness of breath. Lower abdominal pain for 1 month. EXAM: CT CHEST, ABDOMEN, AND PELVIS WITH CONTRAST TECHNIQUE: Multidetector CT imaging of the chest, abdomen and pelvis was performed following the standard protocol during bolus administration of intravenous contrast. CONTRAST:  172m OMNIPAQUE IOHEXOL 300 MG/ML  SOLN COMPARISON:  08/24/2018 FINDINGS: CT CHEST FINDINGS Cardiovascular: Right Port-A-Cath tip: SVC. Coronary, aortic arch, and branch vessel atherosclerotic vascular disease. Chronic large pericardial effusion. Mediastinum/Nodes: Right lower paratracheal node 1.2 cm in short axis on image 24/2, previously 1.3 cm. Right suprahilar node 1.2 cm in short axis also on image 24/2, previously the same. Stable borderline prominent right hilar lymph node. Lungs/Pleura: Severe centrilobular emphysema. Old granulomatous disease. Consolidated region in the left lower lobe measuring 5.1 cm in short axis, previously 4.8 cm. This has some lobularity of its margins and a vertical excursion of 2.5 cm,  previously 1.8 cm, compatible with mild increase in size. Surrounding architectural distortion may be from prior radiation therapy. Pleural-based nodule posteriorly in the right upper lobe is stable at 0.4 cm in diameter on image 39/6. An anterior left upper lobe part solid nodule measures 1.3 by 0.8 cm, previously 1.1 by 0.7 cm Left perihilar bandlike opacity is likely radiation therapy related. Musculoskeletal: Posterior fractures of the right seventh and eighth ribs are associated with increased osteoid deposition compared to the  prior exam, as well as sclerosis in the ribs adjacent to the fractures. These may well be secondary to radiation necrosis. CT ABDOMEN PELVIS FINDINGS Hepatobiliary: Cholecystectomy. Otherwise unremarkable. Pancreas: Unremarkable Spleen: Stable calcifications along the lateral splenic capsule. No new findings. Adrenals/Urinary Tract: Unremarkable Stomach/Bowel: Unremarkable Vascular/Lymphatic: Infrarenal abdominal aortic aneurysm traversed by aorta bi-iliac stent graft. No pathologic adenopathy identified. Reproductive: 4.8 by 5.6 by 3.33 cm complex right adnexal lesion, potentially a mass, significant increased in prominence compared to the prior exam. Uterus absent. Other: Small amount of suspected fat necrosis in the lower pelvic mesentery just above the left adnexa. Musculoskeletal: Chronic compression fractures at L2 and L4 with kyphotic angulation at the L1-2 level. IMPRESSION: 1. Mild increase in size of the left lower lobe mass, a component of active malignancy is not excluded. PET-CT might be considered. Stable mild right suprahilar and right lower paratracheal adenopathy. 2. Interval enlargement of a right adnexal mass which could be metastatic or a primary ovarian lesion. This might be further worked up with pelvic sonography or PET-CT. 3. Mild increase in size of a part solid nodule in the anterior left upper lobe, low-grade adenocarcinoma is a possibility. 4. Increased osteoid deposition adjacent to the chronic posterior right seventh and eighth fractures. The underlying fractures could possibly be secondary to radiation necrosis. 5. Chronic large pericardial effusion. 6. Other imaging findings of potential clinical significance: Coronary atherosclerosis. Chronic compression fractures at L2 and L4. Infrarenal abdominal aortic aneurysm traversed by aorta bi-iliac stent graft. Small amount of fat necrosis in the lower pelvic mesentery just above the left adnexa. Aortic Atherosclerosis (ICD10-I70.0) and  Emphysema (ICD10-J43.9). Electronically Signed   By: Van Clines M.D.   On: 08/24/2019 14:57   ASSESSMENT AND PLAN:  This is a very pleasant 70 years old white female with metastatic non-small cell lung cancer status post stereotactic radiotherapy to the brain as well as a course of concurrent chemoradiation followed by systemic chemotherapy with carboplatin and Alimta. The patient has been in observation since 2012 and has been doing well with no concerning issues. She had repeat CT scan of the chest, abdomen pelvis performed recently.  I personally and independently reviewed the scan images and discussed the results with the patient today. Her scan showed concerning findings for disease progression in the left lower lobe as well as enlargement of right adnexal mass suspicious for metastatic disease versus primary ovarian lesion. I recommended for the patient to have a PET scan performed for further evaluation of her disease.  I will also order MRI of the brain to rule out any new brain metastasis. We will consider referring the patient to cardiothoracic surgery or pulmonary medicine for repeat bronchoscopy and obtaining more tissue for molecular studies and PD-L1 expression. I will see the patient back for follow-up visit in 2 weeks for evaluation and discussion of her scan results and further recommendation regarding treatment of her condition. I strongly encouraged her to quit smoking. She was advised to call immediately if she has any concerning  symptoms in the interval.  Disclaimer: This note was dictated with voice recognition software. Similar sounding words can inadvertently be transcribed and may not be corrected upon review.

## 2019-08-25 NOTE — Addendum Note (Signed)
Addended by: Ardeen Garland on: 08/25/2019 12:40 PM   Modules accepted: Orders

## 2019-08-26 ENCOUNTER — Telehealth: Payer: Self-pay | Admitting: Pulmonary Disease

## 2019-08-26 NOTE — Telephone Encounter (Signed)
Icard, Octavio Graves, DO  Collier Salina, RN  Cc: Amado Coe, RN; P Lbpu Procedure  Hello again,   I am not in the office next week. But if the patient is ok with seeing me day of procedure we can book for Friday morning of next week EBUS.   If she would like to see someone in clinic first she can meet Linna Hoff or Rob, or I can see her in the afternoon on Wednesday or something.   But I would like to get her done ASAP.   Thanks,   Leory Plowman   --------------------------------------------------  [facility] for pt.

## 2019-08-29 NOTE — Telephone Encounter (Signed)
lmtcb for pt.  

## 2019-08-29 NOTE — Telephone Encounter (Signed)
PCCM:  Procedure CANNOT be completed at [facility] we do not have equipment their for this type of case. There unfortunately is no other option in this case.   FYI to Dr. Julien Nordmann. We are working to schedule this lady for her procedure.   Garner Nash, DO [facility] 08/29/2019 6:24 PM

## 2019-08-29 NOTE — Telephone Encounter (Signed)
Called and spoke to pt. Informed her of the recs per Dr. Valeta Harms. Pt agreeable to do the EBUS on Friday 3/12. However, pt refused to have procedure done at [facility] and only wishes to have it done at [facility]. COVID test scheduled for 3/9 at 1pm.   PCC's/Dr. Icard, can the EBUS be performed at [facility]? Pt states she will not do procedure if it is a [place] due to several family members expiring at [facility]. I tried to reason with pt but she refused to speak any further about [facility].

## 2019-08-30 ENCOUNTER — Other Ambulatory Visit (HOSPITAL_COMMUNITY): Payer: Medicare Other

## 2019-08-30 ENCOUNTER — Telehealth: Payer: Self-pay | Admitting: Medical Oncology

## 2019-08-30 ENCOUNTER — Telehealth (HOSPITAL_COMMUNITY): Payer: Self-pay | Admitting: Internal Medicine

## 2019-08-30 NOTE — Telephone Encounter (Signed)
08/30/19~Cancel per patient request. Per patient, she WILL NOT DO CHEMO TX & WILL NOT DO IMAGING TESTS. Advised patient to s/w Dr Mohamed/[facility]. DO NOT CALL TO [facility], per patient request. MF

## 2019-08-30 NOTE — Telephone Encounter (Signed)
Cancelling scans and f/u-  Oniya said she is " not doing the bronchoscopy, the PET scan , the MRI scan or f/u with [facility] because I am not going to do anymore treatment. "I prayed about it and I am at ease with my decision. I have COPD and heart disease and I am not going to do any more tests or treatment".  She said she is calling radiology to cancel scans.   I told her to call back anytime if we can help her. She said she will call back if she wants to schedule an appt with Dr Julien Nordmann.

## 2019-08-30 NOTE — Telephone Encounter (Signed)
Called pt and informed her that the procedure cannot be done at [facility]. Pt refused repeatedly that she does not want to have procedure at all. I attempted to reason with pt about having procedure and she continued to refuse. Pt was calm and understanding but states she chooses not to have procedure. COVID test has been cancelled for today.   Will forward to Dr. Valeta Harms and Julien Nordmann as Juluis Rainier.

## 2019-08-30 NOTE — Telephone Encounter (Signed)
Thanks for attempting to setup.   Dr. Julien Nordmann, would you like to call her?  Thanks - Lance Muss, DO [facility] 08/30/2019 6:18 PM

## 2019-08-31 ENCOUNTER — Telehealth: Payer: Self-pay | Admitting: Medical Oncology

## 2019-08-31 NOTE — Telephone Encounter (Signed)
Per Dr. Julien Nordmann I told pt that if she wanted a referral to Hospice to let us know.

## 2019-08-31 NOTE — Telephone Encounter (Signed)
Great thanks  Garner Nash, DO [facility] 08/31/2019 2:26 PM

## 2019-08-31 NOTE — Telephone Encounter (Signed)
She called yesterday and indicated that she would not do any treatment or any further investigation. We will try to reach out to her and get more information but she has always been reluctant about having any treatment in the past too.

## 2019-09-01 ENCOUNTER — Telehealth: Payer: Self-pay | Admitting: *Deleted

## 2019-09-01 NOTE — Telephone Encounter (Signed)
Oncology Nurse Navigator Documentation  Oncology Nurse Navigator Flowsheets 09/01/2019  Navigator Location [facility]  Navigator Encounter Type Telephone/per Dr. Julien Nordmann, I called Ms. Briley to see if she was ok and her thoughts about tissue dx.  I was unable to reach but did leave vm message with my name and phone number to call.   Telephone Outgoing Call  Treatment Phase Abnormal Scans  Barriers/Navigation Needs Education  Education Other  Interventions Education  Acuity Level 2-Minimal Needs (1-2 Barriers Identified)  Education Method Verbal  Time Spent with Patient 15

## 2019-09-02 ENCOUNTER — Encounter: Payer: Medicare Other | Admitting: Thoracic Surgery (Cardiothoracic Vascular Surgery)

## 2019-09-07 ENCOUNTER — Ambulatory Visit (HOSPITAL_COMMUNITY): Payer: Medicare Other

## 2019-09-08 ENCOUNTER — Ambulatory Visit: Payer: Medicare Other | Admitting: Internal Medicine

## 2019-09-12 ENCOUNTER — Ambulatory Visit (HOSPITAL_COMMUNITY): Payer: Medicare Other

## 2019-10-18 ENCOUNTER — Telehealth: Payer: Self-pay | Admitting: Medical Oncology

## 2019-10-18 NOTE — Telephone Encounter (Signed)
FMLA paperwork left up front for pt to pick up today

## 2019-10-22 DIAGNOSIS — C3490 Malignant neoplasm of unspecified part of unspecified bronchus or lung: Secondary | ICD-10-CM | POA: Diagnosis not present

## 2019-11-05 ENCOUNTER — Other Ambulatory Visit: Payer: Self-pay | Admitting: Internal Medicine

## 2019-11-13 DIAGNOSIS — M7989 Other specified soft tissue disorders: Secondary | ICD-10-CM | POA: Diagnosis not present

## 2019-11-13 DIAGNOSIS — R188 Other ascites: Secondary | ICD-10-CM | POA: Diagnosis not present

## 2019-11-13 DIAGNOSIS — C3490 Malignant neoplasm of unspecified part of unspecified bronchus or lung: Secondary | ICD-10-CM | POA: Diagnosis not present

## 2019-11-13 DIAGNOSIS — R109 Unspecified abdominal pain: Secondary | ICD-10-CM | POA: Diagnosis not present

## 2019-11-13 DIAGNOSIS — I700 Atherosclerosis of aorta: Secondary | ICD-10-CM | POA: Diagnosis not present

## 2019-11-13 DIAGNOSIS — Z85118 Personal history of other malignant neoplasm of bronchus and lung: Secondary | ICD-10-CM | POA: Diagnosis not present

## 2019-11-13 DIAGNOSIS — R1084 Generalized abdominal pain: Secondary | ICD-10-CM | POA: Diagnosis not present

## 2019-11-16 ENCOUNTER — Encounter: Payer: Self-pay | Admitting: Internal Medicine

## 2019-11-16 ENCOUNTER — Other Ambulatory Visit: Payer: Self-pay

## 2019-11-16 ENCOUNTER — Ambulatory Visit (INDEPENDENT_AMBULATORY_CARE_PROVIDER_SITE_OTHER): Payer: Medicare Other | Admitting: Internal Medicine

## 2019-11-16 VITALS — BP 108/62 | HR 86 | Temp 98.3°F | Ht 63.0 in | Wt 144.6 lb

## 2019-11-16 DIAGNOSIS — R935 Abnormal findings on diagnostic imaging of other abdominal regions, including retroperitoneum: Secondary | ICD-10-CM | POA: Diagnosis not present

## 2019-11-16 DIAGNOSIS — R350 Frequency of micturition: Secondary | ICD-10-CM | POA: Diagnosis not present

## 2019-11-16 DIAGNOSIS — I714 Abdominal aortic aneurysm, without rupture: Secondary | ICD-10-CM | POA: Diagnosis not present

## 2019-11-16 DIAGNOSIS — C7931 Secondary malignant neoplasm of brain: Secondary | ICD-10-CM

## 2019-11-16 DIAGNOSIS — C3490 Malignant neoplasm of unspecified part of unspecified bronchus or lung: Secondary | ICD-10-CM

## 2019-11-16 DIAGNOSIS — N3000 Acute cystitis without hematuria: Secondary | ICD-10-CM

## 2019-11-16 DIAGNOSIS — I7143 Infrarenal abdominal aortic aneurysm, without rupture: Secondary | ICD-10-CM

## 2019-11-16 LAB — POCT URINALYSIS DIPSTICK
Blood, UA: NEGATIVE
Glucose, UA: NEGATIVE
Ketones, UA: NEGATIVE
Leukocytes, UA: NEGATIVE
Nitrite, UA: NEGATIVE
Protein, UA: NEGATIVE
Spec Grav, UA: 1.015 (ref 1.010–1.025)
Urobilinogen, UA: 0.2 E.U./dL
pH, UA: 5.5 (ref 5.0–8.0)

## 2019-11-16 MED ORDER — DOXYCYCLINE HYCLATE 100 MG PO CAPS: 100.0000 mg | ORAL_CAPSULE | Freq: Two times a day (BID) | ORAL | 0 refills | Status: AC

## 2019-11-16 NOTE — Patient Instructions (Signed)
Urinary Tract Infection, Adult A urinary tract infection (UTI) is an infection of any part of the urinary tract. The urinary tract includes:  The kidneys.  The ureters.  The bladder.  The urethra. These organs make, store, and get rid of pee (urine) in the body. What are the causes? This is caused by germs (bacteria) in your genital area. These germs grow and cause swelling (inflammation) of your urinary tract. What increases the risk? You are more likely to develop this condition if:  You have a small, thin tube (catheter) to drain pee.  You cannot control when you pee or poop (incontinence).  You are female, and: ? You use these methods to prevent pregnancy:  A medicine that kills sperm (spermicide).  A device that blocks sperm (diaphragm). ? You have low levels of a female hormone (estrogen). ? You are pregnant.  You have genes that add to your risk.  You are sexually active.  You take antibiotic medicines.  You have trouble peeing because of: ? A prostate that is bigger than normal, if you are female. ? A blockage in the part of your body that drains pee from the bladder (urethra). ? A kidney stone. ? A nerve condition that affects your bladder (neurogenic bladder). ? Not getting enough to drink. ? Not peeing often enough.  You have other conditions, such as: ? Diabetes. ? A weak disease-fighting system (immune system). ? Sickle cell disease. ? Gout. ? Injury of the spine. What are the signs or symptoms? Symptoms of this condition include:  Needing to pee right away (urgently).  Peeing often.  Peeing small amounts often.  Pain or burning when peeing.  Blood in the pee.  Pee that smells bad or not like normal.  Trouble peeing.  Pee that is cloudy.  Fluid coming from the vagina, if you are female.  Pain in the belly or lower back. Other symptoms include:  Throwing up (vomiting).  No urge to eat.  Feeling mixed up (confused).  Being tired  and grouchy (irritable).  A fever.  Watery poop (diarrhea). How is this treated? This condition may be treated with:  Antibiotic medicine.  Other medicines.  Drinking enough water. Follow these instructions at home:  Medicines  Take over-the-counter and prescription medicines only as told by your doctor.  If you were prescribed an antibiotic medicine, take it as told by your doctor. Do not stop taking it even if you start to feel better. General instructions  Make sure you: ? Pee until your bladder is empty. ? Do not hold pee for a long time. ? Empty your bladder after sex. ? Wipe from front to back after pooping if you are a female. Use each tissue one time when you wipe.  Drink enough fluid to keep your pee pale yellow.  Keep all follow-up visits as told by your doctor. This is important. Contact a doctor if:  You do not get better after 1-2 days.  Your symptoms go away and then come back. Get help right away if:  You have very bad back pain.  You have very bad pain in your lower belly.  You have a fever.  You are sick to your stomach (nauseous).  You are throwing up. Summary  A urinary tract infection (UTI) is an infection of any part of the urinary tract.  This condition is caused by germs in your genital area.  There are many risk factors for a UTI. These include having a small, thin   tube to drain pee and not being able to control when you pee or poop.  Treatment includes antibiotic medicines for germs.  Drink enough fluid to keep your pee pale yellow. This information is not intended to replace advice given to you by your health care provider. Make sure you discuss any questions you have with your health care provider. Document Revised: 05/27/2018 Document Reviewed: 12/17/2017 Elsevier Patient Education  2020 [employer]  

## 2019-11-22 DIAGNOSIS — C3490 Malignant neoplasm of unspecified part of unspecified bronchus or lung: Secondary | ICD-10-CM | POA: Diagnosis not present

## 2019-11-27 NOTE — Progress Notes (Signed)
This visit occurred during the SARS-CoV-2 public health emergency.  Safety protocols were in place, including screening questions prior to the visit, additional usage of staff PPE, and extensive cleaning of exam room while observing appropriate contact time as indicated for disinfecting solutions.  Subjective:     Patient ID: Courtney West , female    DOB: 01/29/1951 , 69 y.o.   MRN: [id number]   Chief Complaint  Patient presents with  . Abdominal Pain    HPI  She is here today for ER f/u. She is accompanied by her daughter.  She presented to ER on 5/23 for further evaluation of abdominal pain. Patient is a 69 year old female with complex prior medical history. She has history of metastatic lung cancer in 2012 that was treated and initially went into remission. She reports recently she has had recurrence or possibly new malignancy, expanding left lower lobe mass and also adrenal mass. This was diagnosed in March of this year. She has been followed at [facility], but does not wish to have care there anymore. She did not like the tone her oncologist took with her at her last visit.     Past Medical History:  Diagnosis Date  . Abdominal aortic aneurysm ([place])   . Allergy    codeine,pcn,sulfa drugs  . Brain cancer ([place])    mets from lung primary  . CHF (congestive heart failure) ([place])   . Clotted vascular catheter ([place]) 03/30/2012  . COPD (chronic obstructive pulmonary disease) ([place])   . Dyslipidemia   . History of radiation therapy 01/19/12, 01/21/12, 01/26/12   RLL lung  . History of radiation therapy 04/18/11   single fraction to 6 brain mets  . Hypertension   . Lung cancer ([place]) 12/2010  . Lung cancer ([place]) 12/22/2010  . Radiation 10/20/2011   frontal mets/Palliation  . S/P radiation therapy 7/12 thru 9/12, 04/18/11   xrt to brain mets  . Stroke [facility])      Family History  Problem Relation Age of Onset  . Cancer Father        lung, leukemia  . Leukemia Father   . Cancer Maternal  Aunt        kidney  . Kidney disease Mother      Current Outpatient Medications:  .  acetaminophen (TYLENOL) 500 MG tablet, Take 500 mg by mouth every 6 (six) hours as needed., Disp: , Rfl:  .  albuterol (VENTOLIN HFA) 108 (90 Base) MCG/ACT inhaler, Inhale 1-2 puffs into the lungs every 6 (six) hours as needed for wheezing or shortness of breath., Disp: 6.7 g, Rfl: 0 .  furosemide (LASIX) 20 MG tablet, Take 1 tablet by mouth daily (Patient taking differently: 40 mg. Take 1 tablet by mouth daily), Disp: 90 tablet, Rfl: 1 .  gabapentin (NEURONTIN) 300 MG capsule, Take 2 capsules (600 mg total) by mouth 3 (three) times daily., Disp: 180 capsule, Rfl: 3 .  POTASSIUM PO, Take by mouth., Disp: , Rfl:  .  PROAIR HFA 108 (90 Base) MCG/ACT inhaler, INHALE 2 PUFFS BY MOUTH EVERY 4 TO 6 HOURS AS NEEDED, Disp: 18 g, Rfl: 11 .  SYMBICORT 80-4.5 MCG/ACT inhaler, INHALE 2 PUFFS BY MOUTH TWICE DAILY IN THE MORNING AND IN THE EVENING, Disp: 11 g, Rfl: 0 .  temazepam (RESTORIL) 30 MG capsule, Take 1 capsule (30 mg total) by mouth at bedtime as needed for sleep., Disp: 30 capsule, Rfl: 2 .  doxycycline (VIBRAMYCIN) 100 MG capsule, Take 1 capsule (100 mg  total) by mouth 2 (two) times daily., Disp: 14 capsule, Rfl: 0 .  Melatonin 5 MG CAPS, Take 1 capsule by mouth at bedtime as needed., Disp: , Rfl:    Allergies  Allergen Reactions  . Sulfa Antibiotics Nausea Only  . Sulfasalazine Nausea Only  . Carboplatin Itching  . Codeine Nausea And Vomiting  . Dilaudid [Hydromorphone Hcl] Nausea Only  . Hydromorphone Hcl Nausea Only  . Penicillins Itching and Rash    Has patient had a PCN reaction causing immediate rash, facial/tongue/throat swelling, SOB or lightheadedness with hypotension: No Has patient had a PCN reaction causing severe rash involving mucus membranes or skin necrosis: No Has patient had a PCN reaction that required hospitalization No Has patient had a PCN reaction occurring within the last 10 years:  No If all of the above answers are "NO", then may proceed with Cephalosporin use.      Review of Systems  Constitutional: Negative.   Respiratory: Negative.   Cardiovascular: Negative.   Gastrointestinal: Positive for abdominal pain.  Genitourinary: Positive for frequency.  Neurological: Negative.   Psychiatric/Behavioral: Negative.      Today's Vitals   11/16/19 1503  BP: 108/62  Pulse: 86  Temp: 98.3 F (36.8 C)  TempSrc: Oral  SpO2: 93%  Weight: 144 lb 9.6 oz (65.6 kg)  Height: 5\' 3"  (1.6 m)  PainSc: 5   PainLoc: Abdomen   Body mass index is 25.61 kg/m.   Objective:  Physical Exam Vitals and nursing note reviewed.  Constitutional:      Appearance: Normal appearance.  HENT:     Head: Normocephalic and atraumatic.  Cardiovascular:     Rate and Rhythm: Normal rate and regular rhythm.     Heart sounds: Normal heart sounds.  Pulmonary:     Effort: Pulmonary effort is normal.     Breath sounds: Normal breath sounds.  Skin:    General: Skin is warm.  Neurological:     General: No focal deficit present.     Mental Status: She is alert.  Psychiatric:        Mood and Affect: Mood normal.        Behavior: Behavior normal.         Assessment And Plan:     1. Acute cystitis without hematuria  ER records reviewed in full detail.  She was given rx abx , encouraged to take full course. I will repeat urinalysis today.   2. Lung cancer metastatic to brain [facility])  Now chronic. We discussed the need for Hospice vs. Palliative care. She expresses that she does not wish to pursue any further treatment of her condition. She plans to call me within a week to let me know her wishes.   3. Abnormal CT of the abdomen  Results reviewed in full detail with the patient during her appointment. All questions were answered to her and her daughter's satisfaction.   4. Aneurysm of infrarenal abdominal aorta (HCC)  Chronic.   5. Frequency of urination  - POCT Urinalysis  Dipstick (81002)    I personally spent 30 minutes face-to-face and non-face-to-face in the care of this patient, which includes all pre-, intra-, and post visit time on the date of service.   [name], MD    THE PATIENT IS ENCOURAGED TO PRACTICE SOCIAL DISTANCING DUE TO THE COVID-19 PANDEMIC.

## 2019-11-29 DIAGNOSIS — I5030 Unspecified diastolic (congestive) heart failure: Secondary | ICD-10-CM | POA: Diagnosis not present

## 2019-11-29 DIAGNOSIS — J9611 Chronic respiratory failure with hypoxia: Secondary | ICD-10-CM | POA: Diagnosis not present

## 2019-11-29 DIAGNOSIS — I313 Pericardial effusion (noninflammatory): Secondary | ICD-10-CM | POA: Diagnosis not present

## 2019-11-29 DIAGNOSIS — C3490 Malignant neoplasm of unspecified part of unspecified bronchus or lung: Secondary | ICD-10-CM | POA: Diagnosis not present

## 2019-12-06 ENCOUNTER — Other Ambulatory Visit: Payer: Self-pay | Admitting: Internal Medicine

## 2019-12-06 DIAGNOSIS — I272 Pulmonary hypertension, unspecified: Secondary | ICD-10-CM | POA: Diagnosis not present

## 2019-12-06 DIAGNOSIS — I517 Cardiomegaly: Secondary | ICD-10-CM | POA: Diagnosis not present

## 2019-12-06 DIAGNOSIS — J449 Chronic obstructive pulmonary disease, unspecified: Secondary | ICD-10-CM | POA: Diagnosis not present

## 2019-12-06 DIAGNOSIS — J90 Pleural effusion, not elsewhere classified: Secondary | ICD-10-CM | POA: Diagnosis not present

## 2019-12-06 DIAGNOSIS — C7931 Secondary malignant neoplasm of brain: Secondary | ICD-10-CM | POA: Diagnosis not present

## 2019-12-06 DIAGNOSIS — R0602 Shortness of breath: Secondary | ICD-10-CM | POA: Diagnosis not present

## 2019-12-06 DIAGNOSIS — C3490 Malignant neoplasm of unspecified part of unspecified bronchus or lung: Secondary | ICD-10-CM | POA: Diagnosis not present

## 2019-12-06 DIAGNOSIS — Z87891 Personal history of nicotine dependence: Secondary | ICD-10-CM | POA: Diagnosis not present

## 2019-12-06 DIAGNOSIS — R2241 Localized swelling, mass and lump, right lower limb: Secondary | ICD-10-CM | POA: Diagnosis not present

## 2019-12-06 DIAGNOSIS — Z79899 Other long term (current) drug therapy: Secondary | ICD-10-CM | POA: Diagnosis not present

## 2019-12-06 DIAGNOSIS — Z20822 Contact with and (suspected) exposure to covid-19: Secondary | ICD-10-CM | POA: Diagnosis not present

## 2019-12-06 DIAGNOSIS — J9621 Acute and chronic respiratory failure with hypoxia: Secondary | ICD-10-CM | POA: Diagnosis not present

## 2019-12-06 DIAGNOSIS — I313 Pericardial effusion (noninflammatory): Secondary | ICD-10-CM | POA: Diagnosis not present

## 2019-12-06 DIAGNOSIS — I5033 Acute on chronic diastolic (congestive) heart failure: Secondary | ICD-10-CM | POA: Diagnosis not present

## 2019-12-06 DIAGNOSIS — I110 Hypertensive heart disease with heart failure: Secondary | ICD-10-CM | POA: Diagnosis not present

## 2019-12-06 DIAGNOSIS — I088 Other rheumatic multiple valve diseases: Secondary | ICD-10-CM | POA: Diagnosis not present

## 2019-12-06 DIAGNOSIS — R2242 Localized swelling, mass and lump, left lower limb: Secondary | ICD-10-CM | POA: Diagnosis not present

## 2019-12-06 DIAGNOSIS — Z9981 Dependence on supplemental oxygen: Secondary | ICD-10-CM | POA: Diagnosis not present

## 2019-12-06 DIAGNOSIS — I252 Old myocardial infarction: Secondary | ICD-10-CM | POA: Diagnosis not present

## 2019-12-06 DIAGNOSIS — I7143 Infrarenal abdominal aortic aneurysm, without rupture: Secondary | ICD-10-CM

## 2019-12-07 ENCOUNTER — Telehealth: Payer: Self-pay | Admitting: Internal Medicine

## 2019-12-07 DIAGNOSIS — Z85118 Personal history of other malignant neoplasm of bronchus and lung: Secondary | ICD-10-CM | POA: Diagnosis not present

## 2019-12-07 DIAGNOSIS — I313 Pericardial effusion (noninflammatory): Secondary | ICD-10-CM | POA: Diagnosis not present

## 2019-12-07 DIAGNOSIS — R9431 Abnormal electrocardiogram [ECG] [EKG]: Secondary | ICD-10-CM | POA: Diagnosis not present

## 2019-12-07 DIAGNOSIS — R1909 Other intra-abdominal and pelvic swelling, mass and lump: Secondary | ICD-10-CM | POA: Diagnosis not present

## 2019-12-07 DIAGNOSIS — R0602 Shortness of breath: Secondary | ICD-10-CM | POA: Diagnosis not present

## 2019-12-07 NOTE — Telephone Encounter (Signed)
Phone call placed to patient to offer to schedule a visit with [facility]. Phone rang, with no answer I left a voicemail for call back. 

## 2019-12-08 DIAGNOSIS — J449 Chronic obstructive pulmonary disease, unspecified: Secondary | ICD-10-CM | POA: Diagnosis not present

## 2019-12-08 DIAGNOSIS — I5033 Acute on chronic diastolic (congestive) heart failure: Secondary | ICD-10-CM | POA: Diagnosis not present

## 2019-12-08 DIAGNOSIS — C3490 Malignant neoplasm of unspecified part of unspecified bronchus or lung: Secondary | ICD-10-CM | POA: Diagnosis not present

## 2019-12-08 DIAGNOSIS — C7931 Secondary malignant neoplasm of brain: Secondary | ICD-10-CM | POA: Diagnosis not present

## 2019-12-13 ENCOUNTER — Telehealth: Payer: Self-pay | Admitting: Internal Medicine

## 2019-12-13 NOTE — Telephone Encounter (Signed)
Spoke with patient about scheduling a Palliative Consult and she stated that she just recently was at [facility] and when she was discharged home they recommended Hospice services.  She is receiving Hospice through [facility].  Will cancel Palliative referral.

## 2020-01-05 ENCOUNTER — Ambulatory Visit: Payer: Medicare Other

## 2020-01-05 ENCOUNTER — Telehealth: Payer: Self-pay

## 2020-01-05 ENCOUNTER — Encounter: Payer: Medicare Other | Admitting: Internal Medicine

## 2020-01-05 NOTE — Telephone Encounter (Signed)
This nurse attempted to call patient in regards to missed appointments today. Voicemail left for patient to call back in order to reschedule missed appointments.

## 2020-01-08 ENCOUNTER — Emergency Department (HOSPITAL_COMMUNITY)
Admission: EM | Admit: 2020-01-08 | Discharge: 2020-01-08 | Discharge status: Against Medical Advice | Payer: Medicare Other

## 2020-01-08 ENCOUNTER — Other Ambulatory Visit: Payer: Self-pay

## 2020-01-09 ENCOUNTER — Telehealth: Payer: Self-pay

## 2020-01-09 NOTE — Telephone Encounter (Signed)
I left the pt a message that Dr Baird Cancer wanted me to check on the pt today because she called the pt last week and didn't get an answer.

## 2020-01-11 ENCOUNTER — Telehealth: Payer: Self-pay

## 2020-01-11 NOTE — Telephone Encounter (Signed)
I left the pt a message that I was calling because Dr Baird Cancer wanted to check on the patient to see how she is doing.

## 2020-01-11 NOTE — Telephone Encounter (Signed)
The pt said that she went to the ER because she was having trouble breathing and that they never took her to the back to assess her to see if she needed a breathing treatment, oxygen, or anything.

## 2020-01-11 NOTE — Telephone Encounter (Signed)
The pt was told that Dr. Baird Cancer wanted me to check on her because Dr Baird Cancer called the pt last week and didn't get an answer.  The pt said that she went to the aquarium to see dolphins, that hospice changed her from Symbicort to proair and that she tried to tell them that she can only breath while taking the Symbicort.

## 2020-02-22 DEATH — deceased

## 2020-05-22 ENCOUNTER — Telehealth: Payer: Self-pay | Admitting: Radiation Oncology

## 2020-05-22 NOTE — Telephone Encounter (Signed)
Completed HEALTHCARE PROVIDER'S STATEMENT and had Dr. Tammi Klippel sign it. Then, fax it to [phone]. Received fax confirmation of its delivery.
